# Patient Record
Sex: Female | Born: 1935 | Race: White | Hispanic: No | Smoking: Former smoker
Health system: Southern US, Community
[De-identification: ages and names within clinical notes are randomized; demographics above are authoritative.]

## PROBLEM LIST (undated history)

## (undated) DIAGNOSIS — G709 Myoneural disorder, unspecified: Secondary | ICD-10-CM

## (undated) DIAGNOSIS — Z87442 Personal history of urinary calculi: Secondary | ICD-10-CM

## (undated) DIAGNOSIS — T8859XA Other complications of anesthesia, initial encounter: Secondary | ICD-10-CM

## (undated) DIAGNOSIS — C801 Malignant (primary) neoplasm, unspecified: Secondary | ICD-10-CM

## (undated) DIAGNOSIS — R519 Headache, unspecified: Secondary | ICD-10-CM

## (undated) DIAGNOSIS — E785 Hyperlipidemia, unspecified: Secondary | ICD-10-CM

## (undated) DIAGNOSIS — T7840XA Allergy, unspecified, initial encounter: Secondary | ICD-10-CM

## (undated) DIAGNOSIS — D649 Anemia, unspecified: Secondary | ICD-10-CM

## (undated) DIAGNOSIS — R918 Other nonspecific abnormal finding of lung field: Secondary | ICD-10-CM

## (undated) DIAGNOSIS — R51 Headache: Secondary | ICD-10-CM

## (undated) DIAGNOSIS — M1990 Unspecified osteoarthritis, unspecified site: Secondary | ICD-10-CM

## (undated) DIAGNOSIS — T4145XA Adverse effect of unspecified anesthetic, initial encounter: Secondary | ICD-10-CM

## (undated) DIAGNOSIS — C50919 Malignant neoplasm of unspecified site of unspecified female breast: Secondary | ICD-10-CM

## (undated) DIAGNOSIS — R059 Cough, unspecified: Secondary | ICD-10-CM

## (undated) DIAGNOSIS — R0981 Nasal congestion: Secondary | ICD-10-CM

## (undated) DIAGNOSIS — R05 Cough: Secondary | ICD-10-CM

## (undated) DIAGNOSIS — Z8489 Family history of other specified conditions: Secondary | ICD-10-CM

## (undated) DIAGNOSIS — Z5189 Encounter for other specified aftercare: Secondary | ICD-10-CM

## (undated) DIAGNOSIS — IMO0001 Reserved for inherently not codable concepts without codable children: Secondary | ICD-10-CM

## (undated) HISTORY — PX: TOTAL KNEE ARTHROPLASTY: SHX125

## (undated) HISTORY — PX: LITHOTRIPSY: SUR834

## (undated) HISTORY — DX: Myoneural disorder, unspecified: G70.9

## (undated) HISTORY — DX: Unspecified osteoarthritis, unspecified site: M19.90

## (undated) HISTORY — DX: Encounter for other specified aftercare: Z51.89

## (undated) HISTORY — DX: Allergy, unspecified, initial encounter: T78.40XA

## (undated) HISTORY — PX: FOOT SURGERY: SHX648

## (undated) HISTORY — DX: Cough, unspecified: R05.9

## (undated) HISTORY — DX: Malignant (primary) neoplasm, unspecified: C80.1

## (undated) HISTORY — PX: KIDNEY STONE SURGERY: SHX686

## (undated) HISTORY — PX: BACK SURGERY: SHX140

## (undated) HISTORY — PX: SHOULDER ARTHROSCOPY: SHX128

## (undated) HISTORY — DX: Hyperlipidemia, unspecified: E78.5

## (undated) HISTORY — DX: Anemia, unspecified: D64.9

## (undated) HISTORY — DX: Cough: R05

## (undated) HISTORY — DX: Headache, unspecified: R51.9

## (undated) HISTORY — DX: Reserved for inherently not codable concepts without codable children: IMO0001

## (undated) HISTORY — DX: Nasal congestion: R09.81

## (undated) HISTORY — DX: Headache: R51

---

## 2000-06-21 ENCOUNTER — Encounter: Payer: Self-pay | Admitting: Neurosurgery

## 2000-06-25 ENCOUNTER — Encounter: Payer: Self-pay | Admitting: Neurosurgery

## 2000-06-25 ENCOUNTER — Inpatient Hospital Stay (HOSPITAL_COMMUNITY): Admission: RE | Admit: 2000-06-25 | Discharge: 2000-06-30 | Payer: Self-pay | Admitting: Neurosurgery

## 2000-06-28 ENCOUNTER — Encounter: Payer: Self-pay | Admitting: Neurosurgery

## 2004-02-23 ENCOUNTER — Ambulatory Visit: Payer: Self-pay | Admitting: Internal Medicine

## 2004-03-25 ENCOUNTER — Ambulatory Visit: Payer: Self-pay | Admitting: Internal Medicine

## 2004-04-24 ENCOUNTER — Ambulatory Visit: Payer: Self-pay | Admitting: Internal Medicine

## 2004-06-10 ENCOUNTER — Ambulatory Visit: Payer: Self-pay | Admitting: Neurology

## 2004-11-11 ENCOUNTER — Ambulatory Visit: Payer: Self-pay | Admitting: Internal Medicine

## 2006-03-16 ENCOUNTER — Ambulatory Visit: Payer: Self-pay | Admitting: Gastroenterology

## 2007-05-30 ENCOUNTER — Encounter: Admission: RE | Admit: 2007-05-30 | Discharge: 2007-05-30 | Payer: Self-pay | Admitting: General Surgery

## 2007-06-03 ENCOUNTER — Ambulatory Visit: Payer: Self-pay | Admitting: Oncology

## 2007-06-10 LAB — CBC WITH DIFFERENTIAL (CANCER CENTER ONLY)
BASO#: 0 10*3/uL (ref 0.0–0.2)
Eosinophils Absolute: 0.2 10*3/uL (ref 0.0–0.5)
HGB: 11.8 g/dL (ref 11.6–15.9)
MONO#: 0.4 10*3/uL (ref 0.1–0.9)
NEUT#: 3.5 10*3/uL (ref 1.5–6.5)
Platelets: 289 10*3/uL (ref 145–400)
RBC: 4.13 10*6/uL (ref 3.70–5.32)
WBC: 5.7 10*3/uL (ref 3.9–10.0)

## 2007-06-26 HISTORY — PX: BREAST SURGERY: SHX581

## 2007-07-12 ENCOUNTER — Encounter (INDEPENDENT_AMBULATORY_CARE_PROVIDER_SITE_OTHER): Payer: Self-pay | Admitting: General Surgery

## 2007-07-12 ENCOUNTER — Ambulatory Visit (HOSPITAL_COMMUNITY): Admission: RE | Admit: 2007-07-12 | Discharge: 2007-07-13 | Payer: Self-pay | Admitting: General Surgery

## 2007-07-21 ENCOUNTER — Ambulatory Visit: Payer: Self-pay | Admitting: Oncology

## 2007-07-22 LAB — CBC WITH DIFFERENTIAL (CANCER CENTER ONLY)
EOS%: 2.8 % (ref 0.0–7.0)
Eosinophils Absolute: 0.3 10*3/uL (ref 0.0–0.5)
HGB: 11 g/dL — ABNORMAL LOW (ref 11.6–15.9)
LYMPH#: 2 10*3/uL (ref 0.9–3.3)
MCH: 28.4 pg (ref 26.0–34.0)
MCHC: 33.6 g/dL (ref 32.0–36.0)
MONO%: 7.1 % (ref 0.0–13.0)
NEUT#: 6.8 10*3/uL — ABNORMAL HIGH (ref 1.5–6.5)
Platelets: 340 10*3/uL (ref 145–400)
RBC: 3.87 10*6/uL (ref 3.70–5.32)

## 2007-07-22 LAB — CMP (CANCER CENTER ONLY)
ALT(SGPT): 19 U/L (ref 10–47)
AST: 34 U/L (ref 11–38)
Albumin: 3.8 g/dL (ref 3.3–5.5)
CO2: 28 mEq/L (ref 18–33)
Calcium: 10.3 mg/dL (ref 8.0–10.3)
Chloride: 105 mEq/L (ref 98–108)
Creat: 1.2 mg/dl (ref 0.6–1.2)
Potassium: 3.9 mEq/L (ref 3.3–4.7)
Total Protein: 7.5 g/dL (ref 6.4–8.1)

## 2007-07-27 ENCOUNTER — Ambulatory Visit: Admission: RE | Admit: 2007-07-27 | Discharge: 2007-10-18 | Payer: Self-pay | Admitting: Radiation Oncology

## 2007-07-29 ENCOUNTER — Encounter: Admission: RE | Admit: 2007-07-29 | Discharge: 2007-07-29 | Payer: Self-pay | Admitting: Oncology

## 2007-10-21 ENCOUNTER — Ambulatory Visit: Payer: Self-pay | Admitting: Oncology

## 2007-10-24 LAB — RETICULOCYTES (CHCC): ABS Retic: 67 10*3/uL (ref 19.0–186.0)

## 2007-10-24 LAB — CBC WITH DIFFERENTIAL (CANCER CENTER ONLY)
BASO%: 0.3 % (ref 0.0–2.0)
LYMPH%: 16 % (ref 14.0–48.0)
MCV: 83 fL (ref 81–101)
MONO#: 0.4 10*3/uL (ref 0.1–0.9)
Platelets: 270 10*3/uL (ref 145–400)
RDW: 14.7 % — ABNORMAL HIGH (ref 10.5–14.6)
WBC: 4.6 10*3/uL (ref 3.9–10.0)

## 2007-10-24 LAB — IRON AND TIBC
%SAT: 18 % — ABNORMAL LOW (ref 20–55)
Iron: 82 ug/dL (ref 42–145)
TIBC: 456 ug/dL (ref 250–470)
UIBC: 374 ug/dL

## 2007-10-24 LAB — COMPREHENSIVE METABOLIC PANEL
ALT: 16 U/L (ref 0–35)
AST: 17 U/L (ref 0–37)
Alkaline Phosphatase: 48 U/L (ref 39–117)
Creatinine, Ser: 1.75 mg/dL — ABNORMAL HIGH (ref 0.40–1.20)
Total Bilirubin: 0.3 mg/dL (ref 0.3–1.2)

## 2007-10-24 LAB — CANCER ANTIGEN 27.29: CA 27.29: 17 U/mL (ref 0–39)

## 2007-10-24 LAB — VITAMIN B12: Vitamin B-12: 1717 pg/mL — ABNORMAL HIGH (ref 211–911)

## 2007-10-24 LAB — FOLATE: Folate: >20 ng/mL

## 2007-11-24 LAB — CBC WITH DIFFERENTIAL (CANCER CENTER ONLY)
BASO%: 0.3 % (ref 0.0–2.0)
HCT: 32.5 % — ABNORMAL LOW (ref 34.8–46.6)
LYMPH#: 0.9 10*3/uL (ref 0.9–3.3)
MONO#: 0.3 10*3/uL (ref 0.1–0.9)
Platelets: 276 10*3/uL (ref 145–400)
RDW: 14.4 % (ref 10.5–14.6)
WBC: 5.4 10*3/uL (ref 3.9–10.0)

## 2008-01-02 ENCOUNTER — Ambulatory Visit: Payer: Self-pay | Admitting: Oncology

## 2008-01-09 LAB — CBC WITH DIFFERENTIAL (CANCER CENTER ONLY)
BASO#: 0 10*3/uL (ref 0.0–0.2)
BASO%: 0.5 % (ref 0.0–2.0)
EOS%: 4.9 % (ref 0.0–7.0)
HGB: 10.9 g/dL — ABNORMAL LOW (ref 11.6–15.9)
LYMPH#: 1.1 10*3/uL (ref 0.9–3.3)
MCH: 29.5 pg (ref 26.0–34.0)
MCHC: 34.5 g/dL (ref 32.0–36.0)
MONO%: 9.3 % (ref 0.0–13.0)
NEUT#: 4.2 10*3/uL (ref 1.5–6.5)
RDW: 13.7 % (ref 10.5–14.6)

## 2008-01-10 ENCOUNTER — Ambulatory Visit: Payer: Self-pay | Admitting: Oncology

## 2008-04-04 ENCOUNTER — Ambulatory Visit: Payer: Self-pay | Admitting: Oncology

## 2008-04-10 LAB — CBC WITH DIFFERENTIAL (CANCER CENTER ONLY)
BASO%: 0.7 % (ref 0.0–2.0)
EOS%: 5 % (ref 0.0–7.0)
Eosinophils Absolute: 0.3 10*3/uL (ref 0.0–0.5)
LYMPH%: 20.6 % (ref 14.0–48.0)
MCH: 29.7 pg (ref 26.0–34.0)
MCHC: 33.9 g/dL (ref 32.0–36.0)
MCV: 88 fL (ref 81–101)
MONO%: 8.4 % (ref 0.0–13.0)
Platelets: 273 10*3/uL (ref 145–400)
RDW: 13.5 % (ref 10.5–14.6)

## 2008-04-10 LAB — CMP (CANCER CENTER ONLY)
ALT(SGPT): 23 U/L (ref 10–47)
AST: 27 U/L (ref 11–38)
Albumin: 3.8 g/dL (ref 3.3–5.5)
CO2: 28 mEq/L (ref 18–33)
Calcium: 9.7 mg/dL (ref 8.0–10.3)
Chloride: 104 mEq/L (ref 98–108)
Potassium: 5.1 mEq/L — ABNORMAL HIGH (ref 3.3–4.7)
Total Protein: 7.5 g/dL (ref 6.4–8.1)

## 2008-05-10 ENCOUNTER — Encounter: Admission: RE | Admit: 2008-05-10 | Discharge: 2008-05-10 | Payer: Self-pay | Admitting: General Surgery

## 2008-05-21 ENCOUNTER — Ambulatory Visit: Payer: Self-pay | Admitting: Oncology

## 2008-05-22 LAB — CMP (CANCER CENTER ONLY)
Albumin: 4.1 g/dL (ref 3.3–5.5)
BUN, Bld: 50 mg/dL — ABNORMAL HIGH (ref 7–22)
Calcium: 10 mg/dL (ref 8.0–10.3)
Chloride: 104 mEq/L (ref 98–108)
Creat: 1.8 mg/dl — ABNORMAL HIGH (ref 0.6–1.2)
Glucose, Bld: 44 mg/dL — ABNORMAL LOW (ref 73–118)
Potassium: 5.5 mEq/L — ABNORMAL HIGH (ref 3.3–4.7)

## 2008-05-22 LAB — CBC WITH DIFFERENTIAL (CANCER CENTER ONLY)
BASO#: 0 10*3/uL (ref 0.0–0.2)
Eosinophils Absolute: 0.2 10*3/uL (ref 0.0–0.5)
HGB: 11.9 g/dL (ref 11.6–15.9)
LYMPH%: 18.4 % (ref 14.0–48.0)
MCH: 29.1 pg (ref 26.0–34.0)
MCHC: 33.1 g/dL (ref 32.0–36.0)
MCV: 88 fL (ref 81–101)
MONO%: 8.2 % (ref 0.0–13.0)
NEUT%: 69.6 % (ref 39.6–80.0)
RBC: 4.09 10*6/uL (ref 3.70–5.32)

## 2008-07-24 ENCOUNTER — Ambulatory Visit: Payer: Self-pay | Admitting: Oncology

## 2008-07-25 LAB — CBC WITH DIFFERENTIAL (CANCER CENTER ONLY)
BASO#: 0 10*3/uL (ref 0.0–0.2)
Eosinophils Absolute: 0.3 10*3/uL (ref 0.0–0.5)
HCT: 35.7 % (ref 34.8–46.6)
HGB: 11.9 g/dL (ref 11.6–15.9)
LYMPH#: 1.4 10*3/uL (ref 0.9–3.3)
MCH: 29.4 pg (ref 26.0–34.0)
NEUT#: 2.9 10*3/uL (ref 1.5–6.5)
NEUT%: 57.4 % (ref 39.6–80.0)
RBC: 4.04 10*6/uL (ref 3.70–5.32)

## 2008-07-25 LAB — COMPREHENSIVE METABOLIC PANEL
AST: 31 U/L (ref 0–37)
Alkaline Phosphatase: 53 U/L (ref 39–117)
Glucose, Bld: 57 mg/dL — ABNORMAL LOW (ref 70–99)
Potassium: 5.6 mEq/L — ABNORMAL HIGH (ref 3.5–5.3)
Sodium: 141 mEq/L (ref 135–145)
Total Bilirubin: 0.6 mg/dL (ref 0.3–1.2)
Total Protein: 7.1 g/dL (ref 6.0–8.3)

## 2008-07-26 ENCOUNTER — Ambulatory Visit: Payer: Self-pay | Admitting: Oncology

## 2008-10-19 ENCOUNTER — Ambulatory Visit: Payer: Self-pay | Admitting: Oncology

## 2008-10-25 LAB — CBC WITH DIFFERENTIAL (CANCER CENTER ONLY)
BASO#: 0 10*3/uL (ref 0.0–0.2)
EOS%: 7.1 % — ABNORMAL HIGH (ref 0.0–7.0)
HCT: 32.7 % — ABNORMAL LOW (ref 34.8–46.6)
HGB: 11.7 g/dL (ref 11.6–15.9)
LYMPH#: 1.3 10*3/uL (ref 0.9–3.3)
MCHC: 35.6 g/dL (ref 32.0–36.0)
MONO#: 0.5 10*3/uL (ref 0.1–0.9)
NEUT#: 2.5 10*3/uL (ref 1.5–6.5)
NEUT%: 54.4 % (ref 39.6–80.0)
RBC: 3.77 10*6/uL (ref 3.70–5.32)
WBC: 4.7 10*3/uL (ref 3.9–10.0)

## 2008-10-25 LAB — CMP (CANCER CENTER ONLY)
ALT(SGPT): 26 U/L (ref 10–47)
Albumin: 3.5 g/dL (ref 3.3–5.5)
CO2: 26 mEq/L (ref 18–33)
Chloride: 107 mEq/L (ref 98–108)
Glucose, Bld: 155 mg/dL — ABNORMAL HIGH (ref 73–118)
Potassium: 5.5 mEq/L — ABNORMAL HIGH (ref 3.3–4.7)
Sodium: 140 mEq/L (ref 128–145)
Total Bilirubin: 0.5 mg/dl (ref 0.20–1.60)
Total Protein: 7.1 g/dL (ref 6.4–8.1)

## 2008-12-26 ENCOUNTER — Ambulatory Visit: Payer: Self-pay | Admitting: Neurosurgery

## 2009-01-15 ENCOUNTER — Ambulatory Visit: Payer: Self-pay | Admitting: Sports Medicine

## 2009-02-11 ENCOUNTER — Ambulatory Visit: Payer: Self-pay | Admitting: Unknown Physician Specialty

## 2009-02-12 ENCOUNTER — Ambulatory Visit: Payer: Self-pay | Admitting: Unknown Physician Specialty

## 2009-05-06 ENCOUNTER — Ambulatory Visit: Payer: Self-pay | Admitting: Oncology

## 2009-05-09 LAB — CMP (CANCER CENTER ONLY)
Alkaline Phosphatase: 65 U/L (ref 26–84)
BUN, Bld: 31 mg/dL — ABNORMAL HIGH (ref 7–22)
CO2: 33 mEq/L (ref 18–33)
Creat: 1.2 mg/dl (ref 0.6–1.2)
Glucose, Bld: 180 mg/dL — ABNORMAL HIGH (ref 73–118)
Total Bilirubin: 0.4 mg/dl (ref 0.20–1.60)
Total Protein: 7.3 g/dL (ref 6.4–8.1)

## 2009-05-09 LAB — CBC WITH DIFFERENTIAL (CANCER CENTER ONLY)
BASO%: 0.5 % (ref 0.0–2.0)
EOS%: 5.9 % (ref 0.0–7.0)
HCT: 34.3 % — ABNORMAL LOW (ref 34.8–46.6)
LYMPH#: 1.6 10*3/uL (ref 0.9–3.3)
MCHC: 33.5 g/dL (ref 32.0–36.0)
MONO#: 0.5 10*3/uL (ref 0.1–0.9)
NEUT#: 3 10*3/uL (ref 1.5–6.5)
NEUT%: 56.2 % (ref 39.6–80.0)
Platelets: 246 10*3/uL (ref 145–400)
RDW: 13.9 % (ref 10.5–14.6)
WBC: 5.4 10*3/uL (ref 3.9–10.0)

## 2009-05-09 LAB — CANCER ANTIGEN 27.29: CA 27.29: 16 U/mL (ref 0–39)

## 2009-05-15 ENCOUNTER — Encounter: Admission: RE | Admit: 2009-05-15 | Discharge: 2009-05-15 | Payer: Self-pay | Admitting: Internal Medicine

## 2009-05-25 DIAGNOSIS — C50919 Malignant neoplasm of unspecified site of unspecified female breast: Secondary | ICD-10-CM

## 2009-05-25 HISTORY — PX: MASTECTOMY: SHX3

## 2009-05-25 HISTORY — PX: BREAST SURGERY: SHX581

## 2009-05-25 HISTORY — DX: Malignant neoplasm of unspecified site of unspecified female breast: C50.919

## 2009-07-24 ENCOUNTER — Ambulatory Visit: Payer: Self-pay | Admitting: Unknown Physician Specialty

## 2009-07-30 ENCOUNTER — Inpatient Hospital Stay: Payer: Self-pay | Admitting: Unknown Physician Specialty

## 2009-11-06 ENCOUNTER — Ambulatory Visit: Payer: Self-pay | Admitting: Oncology

## 2009-11-12 LAB — CMP (CANCER CENTER ONLY)
AST: 27 U/L (ref 11–38)
Albumin: 4 g/dL (ref 3.3–5.5)
Alkaline Phosphatase: 81 U/L (ref 26–84)
BUN, Bld: 25 mg/dL — ABNORMAL HIGH (ref 7–22)
Calcium: 9.7 mg/dL (ref 8.0–10.3)
Chloride: 98 mEq/L (ref 98–108)
Glucose, Bld: 70 mg/dL — ABNORMAL LOW (ref 73–118)
Potassium: 4 mEq/L (ref 3.3–4.7)
Sodium: 137 mEq/L (ref 128–145)
Total Protein: 6.8 g/dL (ref 6.4–8.1)

## 2009-11-12 LAB — CBC WITH DIFFERENTIAL (CANCER CENTER ONLY)
BASO#: 0 10*3/uL (ref 0.0–0.2)
Eosinophils Absolute: 0.3 10*3/uL (ref 0.0–0.5)
HGB: 12.1 g/dL (ref 11.6–15.9)
LYMPH#: 1.7 10*3/uL (ref 0.9–3.3)
NEUT#: 3.1 10*3/uL (ref 1.5–6.5)
Platelets: 352 10*3/uL (ref 145–400)
RBC: 4.06 10*6/uL (ref 3.70–5.32)
WBC: 5.7 10*3/uL (ref 3.9–10.0)

## 2010-02-06 ENCOUNTER — Encounter: Admission: RE | Admit: 2010-02-06 | Discharge: 2010-02-06 | Payer: Self-pay | Admitting: General Surgery

## 2010-02-17 ENCOUNTER — Encounter: Admission: RE | Admit: 2010-02-17 | Discharge: 2010-02-17 | Payer: Self-pay | Admitting: Endocrinology

## 2010-05-02 ENCOUNTER — Encounter
Admission: RE | Admit: 2010-05-02 | Discharge: 2010-05-02 | Payer: Self-pay | Source: Home / Self Care | Attending: Internal Medicine | Admitting: Internal Medicine

## 2010-05-25 HISTORY — PX: REDUCTION MAMMAPLASTY: SUR839

## 2010-06-13 ENCOUNTER — Ambulatory Visit: Payer: Self-pay | Admitting: Oncology

## 2010-06-17 LAB — CBC WITH DIFFERENTIAL/PLATELET
Basophils Absolute: 0.1 10*3/uL (ref 0.0–0.1)
Eosinophils Absolute: 0.3 10*3/uL (ref 0.0–0.5)
HGB: 12.7 g/dL (ref 11.6–15.9)
MCV: 89.1 fL (ref 79.5–101.0)
MONO#: 0.8 10*3/uL (ref 0.1–0.9)
MONO%: 12.1 % (ref 0.0–14.0)
NEUT#: 3.6 10*3/uL (ref 1.5–6.5)
RDW: 14.5 % (ref 11.2–14.5)
WBC: 6.4 10*3/uL (ref 3.9–10.3)
lymph#: 1.5 10*3/uL (ref 0.9–3.3)

## 2010-06-17 LAB — COMPREHENSIVE METABOLIC PANEL
Albumin: 4.5 g/dL (ref 3.5–5.2)
BUN: 30 mg/dL — ABNORMAL HIGH (ref 6–23)
CO2: 27 mEq/L (ref 19–32)
Calcium: 10.9 mg/dL — ABNORMAL HIGH (ref 8.4–10.5)
Chloride: 102 mEq/L (ref 96–112)
Glucose, Bld: 100 mg/dL — ABNORMAL HIGH (ref 70–99)
Potassium: 4.5 mEq/L (ref 3.5–5.3)
Total Protein: 7.2 g/dL (ref 6.0–8.3)

## 2010-07-01 ENCOUNTER — Other Ambulatory Visit: Payer: Self-pay | Admitting: Endocrinology

## 2010-07-01 DIAGNOSIS — E042 Nontoxic multinodular goiter: Secondary | ICD-10-CM

## 2010-07-07 ENCOUNTER — Ambulatory Visit
Admission: RE | Admit: 2010-07-07 | Discharge: 2010-07-07 | Disposition: A | Discharge status: Home / Self Care | Payer: Medicare Other | Source: Ambulatory Visit | Attending: Endocrinology | Admitting: Endocrinology

## 2010-07-07 DIAGNOSIS — E042 Nontoxic multinodular goiter: Secondary | ICD-10-CM

## 2010-07-30 ENCOUNTER — Encounter (HOSPITAL_BASED_OUTPATIENT_CLINIC_OR_DEPARTMENT_OTHER)
Admission: RE | Admit: 2010-07-30 | Discharge: 2010-07-30 | Disposition: A | Discharge status: Home / Self Care | Payer: Medicare Other | Source: Ambulatory Visit | Attending: Plastic Surgery | Admitting: Plastic Surgery

## 2010-07-30 LAB — BASIC METABOLIC PANEL
BUN: 31 mg/dL — ABNORMAL HIGH (ref 6–23)
Calcium: 9.3 mg/dL (ref 8.4–10.5)
GFR calc non Af Amer: 47 mL/min — ABNORMAL LOW (ref >60)
Glucose, Bld: 90 mg/dL (ref 70–99)
Potassium: 4.5 mEq/L (ref 3.5–5.1)

## 2010-08-04 ENCOUNTER — Other Ambulatory Visit: Payer: Self-pay | Admitting: Plastic Surgery

## 2010-08-04 ENCOUNTER — Ambulatory Visit (HOSPITAL_BASED_OUTPATIENT_CLINIC_OR_DEPARTMENT_OTHER)
Admission: RE | Admit: 2010-08-04 | Discharge: 2010-08-04 | Disposition: A | Discharge status: Home / Self Care | Payer: Medicare Other | Source: Ambulatory Visit | Attending: Plastic Surgery | Admitting: Plastic Surgery

## 2010-08-04 DIAGNOSIS — J45909 Unspecified asthma, uncomplicated: Secondary | ICD-10-CM | POA: Insufficient documentation

## 2010-08-04 DIAGNOSIS — Z01812 Encounter for preprocedural laboratory examination: Secondary | ICD-10-CM | POA: Insufficient documentation

## 2010-08-04 DIAGNOSIS — Z7982 Long term (current) use of aspirin: Secondary | ICD-10-CM | POA: Insufficient documentation

## 2010-08-04 DIAGNOSIS — Z853 Personal history of malignant neoplasm of breast: Secondary | ICD-10-CM | POA: Insufficient documentation

## 2010-08-04 DIAGNOSIS — E119 Type 2 diabetes mellitus without complications: Secondary | ICD-10-CM | POA: Insufficient documentation

## 2010-08-04 DIAGNOSIS — N6481 Ptosis of breast: Secondary | ICD-10-CM | POA: Insufficient documentation

## 2010-08-04 DIAGNOSIS — Z01818 Encounter for other preprocedural examination: Secondary | ICD-10-CM | POA: Insufficient documentation

## 2010-08-04 LAB — GLUCOSE, CAPILLARY: Glucose-Capillary: 129 mg/dL — ABNORMAL HIGH (ref 70–99)

## 2010-09-26 ENCOUNTER — Ambulatory Visit
Admission: RE | Admit: 2010-09-26 | Discharge: 2010-09-26 | Disposition: A | Discharge status: Home / Self Care | Payer: Medicare Other | Source: Ambulatory Visit | Attending: Plastic Surgery | Admitting: Plastic Surgery

## 2010-09-26 ENCOUNTER — Other Ambulatory Visit: Payer: Self-pay | Admitting: Plastic Surgery

## 2010-09-26 ENCOUNTER — Encounter (HOSPITAL_BASED_OUTPATIENT_CLINIC_OR_DEPARTMENT_OTHER)
Admission: RE | Admit: 2010-09-26 | Discharge: 2010-09-26 | Disposition: A | Discharge status: Home / Self Care | Payer: Medicare Other | Source: Ambulatory Visit | Attending: Plastic Surgery | Admitting: Plastic Surgery

## 2010-09-26 DIAGNOSIS — Z01811 Encounter for preprocedural respiratory examination: Secondary | ICD-10-CM

## 2010-09-26 LAB — BASIC METABOLIC PANEL
BUN: 37 mg/dL — ABNORMAL HIGH (ref 6–23)
Calcium: 10 mg/dL (ref 8.4–10.5)
Creatinine, Ser: 1.21 mg/dL — ABNORMAL HIGH (ref 0.4–1.2)
GFR calc non Af Amer: 43 mL/min — ABNORMAL LOW (ref >60)
Glucose, Bld: 104 mg/dL — ABNORMAL HIGH (ref 70–99)
Potassium: 4.8 mEq/L (ref 3.5–5.1)

## 2010-09-29 ENCOUNTER — Ambulatory Visit (HOSPITAL_BASED_OUTPATIENT_CLINIC_OR_DEPARTMENT_OTHER)
Admission: RE | Admit: 2010-09-29 | Discharge: 2010-09-29 | Disposition: A | Discharge status: Home / Self Care | Payer: Medicare Other | Source: Ambulatory Visit | Attending: Plastic Surgery | Admitting: Plastic Surgery

## 2010-09-29 DIAGNOSIS — Y836 Removal of other organ (partial) (total) as the cause of abnormal reaction of the patient, or of later complication, without mention of misadventure at the time of the procedure: Secondary | ICD-10-CM | POA: Insufficient documentation

## 2010-09-29 DIAGNOSIS — Z01812 Encounter for preprocedural laboratory examination: Secondary | ICD-10-CM | POA: Insufficient documentation

## 2010-09-29 DIAGNOSIS — Z01810 Encounter for preprocedural cardiovascular examination: Secondary | ICD-10-CM | POA: Insufficient documentation

## 2010-09-29 DIAGNOSIS — T8131XA Disruption of external operation (surgical) wound, not elsewhere classified, initial encounter: Secondary | ICD-10-CM | POA: Insufficient documentation

## 2010-09-29 DIAGNOSIS — Z79899 Other long term (current) drug therapy: Secondary | ICD-10-CM | POA: Insufficient documentation

## 2010-09-29 LAB — GLUCOSE, CAPILLARY: Glucose-Capillary: 173 mg/dL — ABNORMAL HIGH (ref 70–99)

## 2010-09-30 NOTE — Op Note (Signed)
  NAMESHU, LACOCK                ACCOUNT NO.:  [id number]  MEDICAL RECORD NO.:  [id number]           PATIENT TYPE:  LOCATION:                                 FACILITY:  PHYSICIAN:  Theodoro Kos, DO           DATE OF BIRTH:  DATE OF PROCEDURE:  09/29/2010 DATE OF DISCHARGE:                              OPERATIVE REPORT   PREOPERATIVE DIAGNOSIS:  Left breast wound status post reduction.  POSTOPERATIVE DIAGNOSIS:  Left breast wound status post reduction.  PROCEDURE:  Irrigation and debridement of left breast wound with primary closure with tissue advancement.  ATTENDING:  Theodoro Kos, DO  ANESTHESIA:  Sedation with local.  INDICATIONS FOR PROCEDURE:  Ms. Manzer is a 77 year old female who underwent breast reduction for symmetry due to breast cancer on the opposite side.  She had wound breakdown likely secondary to diabetes. She has been undergoing dressing changes.  Decision was made to bring her into the OR for primary closure, irrigation and debridement.  DESCRIPTION OF PROCEDURE:  The patient was taken to the operating room and placed on the operating room table in a supine position.  Anesthesia was administered.  A time-out was called, all information was confirmed to be correct.  She was prepped and draped in the usual sterile fashion. Lidocaine 1% with epinephrine was injected around the site.  The skin, subcuticular tissue, and fat was debrided with a #15 blade.  Undermining was done in the medial and lateral position 2 cm.  The tissue was advanced in order for closure to be obtained.  The deep layers were closed with 3-0 Vicryl, 4-0 Vicryl, and then 5-0 Monocryl.  Dermabond was then applied. She tolerated the procedure well with no complications.  She was awoken and taken to recovery in stable condition.  The wound was 25 cm2.     Theodoro Kos, DO     CS/MEDQ  D:  09/29/2010  T:  09/30/2010  Job:  [id number]  Electronically Signed by Theodoro Kos  on  09/30/2010 04:19:41 PM

## 2010-10-07 NOTE — Op Note (Signed)
NAMELORRAINA, PIGOTT                ACCOUNT NO.:  [id number]   MEDICAL RECORD NO.:  [id number]          PATIENT TYPE:  OIB   LOCATION:  [id number]                         FACILITY:  [facility]   PHYSICIAN:  Sammuel Hines. Daiva Nakayama, M.D. 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   PROCEDURES:  Right mastectomy and sentinel-lymph node biopsy x2 with  injection of blue dye.   SURGEON:  Dr. Marlou Starks.   ASSISTANT:  Dr. Johney Maine.   ANESTHESIA:  General endotracheal.   PROCEDURE:  After informed consent was obtained, the patient was brought  to the operating room and placed in a supine position on the operating  room table.  After induction of general anesthesia, the patient's right  chest and axilla were prepped with Betadine and draped in usual sterile  manner.  Two mL of methylene blue and 3 mL of injectable saline were  then injected in the subareolar position without difficulty.  Earlier in  the day, the patient had undergone injection of 1 mCi of technetium  sulfa colloid into the subareolar area as well.  A hot area was  identified in the right axilla.  This area was marked.  An elliptical  incision was then made around the nipple-areolar complex with a 10 blade  knife.  This incision was then carried down through the skin and  subcutaneous tissue sharply with electrocautery.  Skin hooks were then  used to elevate the skin flap towards the ceiling, and thin skin flaps  were created through the subcutaneous tissue sharply with the  electrocautery.  The skin flaps were then taken all the way down to the  chest wall.  This was done circumferentially around the wound.  Once the  superior skin flap was taken down into and near the axilla in the  superior lateral  portion of the incision, the NeoProbe was then used to  identify the hot spot again.  The axilla was entered sharply with the  electrocautery, and then blunt hemostat dissection was used with the  direction of the NeoProbe until a sentinel node was identified.  The  first sentinel node was hot and blue with ex vivo counts approximately  2400.  It was excised sharply with electrocautery and sent as sentinel  node #1.  A second deeper lymph node was also identified.  It was  dissected bluntly with hemostat dissection, and then once elevated, the  lymphatics of this node were clamped with a hemostat and divided and  ligated with a 3-0 Vicryl tie.  This node was hot with ex vivo counts of  800 but not blue, and it was sent as sentinel node #2 with no other hot  spots or blue or palpable lymph nodes could be appreciated.  At this  point, the attention was then turned to the breast.  The breast with the  pectoral fascia was removed superiorly and inferiorly.  This was done  sharply with electrocautery.  Once the breast was separated from the  chest wall, the lateral portion of the breast was amputated about the  level of the axilla so that no further breast tissue could be  appreciated.  This breast was then marked with a stitch on the lateral  aspect and sent to pathology for further evaluation.  The wound was then  irrigated with copious amounts of saline.  A small stab incision was  made laterally and inferiorly to the wound with a 15 blade knife.  A  tonsil clamp was placed through this opening into the wound, and a 19-  [place] round Blake drain was then brought into the wound.  The drain was  placed along the chest wall and anchored to the skin with a 3-0 nylon  stitch.  The superior and inferior skin flaps were then grossly  reapproximated with interrupted 3-0 Vicryl stitches, and the skin  was closed with staples.  Sterile dressings were then applied.  The  drain was placed to bulb  suction.  The patient tolerated the procedure  well.  At the end of the case, all needle, sponge and instrument counts  were correct.  The patient was then awakened and taken recovery in  stable condition.      Sammuel Hines. Daiva Nakayama, M.D.  Electronically Signed     PST/MEDQ  D:  07/12/2007  T:  07/13/2007  Job:  [id number]

## 2010-10-10 NOTE — Op Note (Signed)
[place]. [facility]  Patient:    Andrea Wallace, Andrea Wallace                       MRN: [id number] Proc. Date: 06/25/00 Adm. Date:  [id number] Attending:  Abran Duke                           Operative Report  PREOPERATIVE DIAGNOSIS:  Grade 1 degenerative spondylolisthesis with radiculopathy.  POSTOPERATIVE DIAGNOSIS:  Grade 1 degenerative spondylolisthesis with radiculopathy.  PROCEDURES: 1. L4-5 decompressive laminectomy with bilateral L4 and L5 foraminotomies. 2. L4-5 posterior lumbar interbody fusion utilizing Tangent wedges and local    autograft. 3. Posterolateral fusion utilizing pedicle screws and local autograft.  SURGEON:  Earnie Larsson, M.D.  ASSISTANT:  Faythe Ghee, M.D.  ANESTHESIA:  General endotracheal.  INDICATIONS:  Ms. Takata is a 77 year old female who has history of back and lower extremity pain consistent with bilateral L4 radiculopathy that is much worse on the left side.  The patient has failed conservative management.  The patient has an MRI scan demonstrating degenerative spondylolisthesis, grade 1, at L4-5, with severe bilateral L4 neural foramen stenosis, as well as moderate central stenosis.  The patient has been counseled as to her options.  She desires to proceed with an L4-5 decompression and fusion for hopeful relief of her symptoms.  DESCRIPTION OF PROCEDURE:  The patient was taken to the operating room and placed on the operating table, where after general endotracheal anesthesia was achieved, the patient was positioned prone onto the Saxon frame, appropriately padded for surgery of the lumbar region.  Shaved, prepped, and draped sterilely.  A 10 blade was used to make a linear skin incision overlying the L4-5 interspace.  This was carried down sharply in the midline. A subperiosteal dissection was then performed bilaterally, exposing the lamina of L3, L4, and L5, as well as the transverse processes of L4 and L5.   Deep self-retaining retractor was placed.  Intraoperative fluoroscopy was used, and the level was confirmed.  Decompressive laminectomy was then performed at L4-5 using Leksell rongeurs and high-speed drill and Kerrison rongeurs to completely remove the lamina of L4, then remove the superior one-third of the lamina of L5.  The ligamentum flavum between L3-4 and L4-5 were both removed using Kerrison rongeurs.  Underlying thecal sac was identified.  The facet joints were then removed between L4 and L5 using Kerrison rongeurs.  All bone was saved for later use in the fusion.  Exiting L4 nerve roots were followed out distally throughout their foramen.  Wide decompressive foraminotomies were then performed at both sides.  The wound was then copiously irrigated with antibiotic solution.  Attention was then placed to the interspace.  The thecal sac and exiting L5 nerve root were mobilized and tracked toward the midline, starting first on the left side.  The disk space was isolated, incised with a 15 blade in rectangular fashion.  A wide disk space cleanout was achieved using pituitary rongeurs, upward-angled pituitary rongeurs, and Epstein curettes.  The disk space was then distracted up to 9 mm, which subsequently reduced the patients deformity.  Attention was then placed to the patients contralateral side.  Once again a diskectomy was performed.  Preparation then made for interbody bone grafting.  Starting first on the patients left side, thecal sac and nerve roots were mobilized toward the midline.  Disk space was then reamed and cut with  an 8 mm Tangent wedge chisel.  An 8 x 26 mm Tangent wedge was then packed into place and recessed approximately 3 mm from the posterior cortical margin.  Fluoroscopic images revealed good placement of the bone graft at the proper operative level with normal anatomy of the spine. The procedure was then repeated on the contralateral side.  Prior to installation  of the second wedge, the interspace was packed with morcellized autograft.  Once again fluoroscopy revealed good position of the bone grafts. The patients deformity had been reduced.  Preparation then made for placement of the pedicle screw instrumentation.  The pedicles were isolated at L4 and L5 bilaterally.  Superficial cortical bone was removed using the high-speed drill.  Pedicle awl was then used to pass to the pedicle of L4 and L5 bilaterally.  This was done under fluoroscopic guidance.  Each pedicle was then tapped with the 5.2 mm screw tap.  At L4, 6.75 x 45 mm STRS pedicle screws were placed bilaterally, at L5 6.75 x 40 mm STRS pedicle screws were placed bilaterally.  All screws were given a final tightening and found to be solidly within the bone.  Prior to installation of the screws, the tapped holes were probed using a blunt probe and found to be within solid bone throughout.  A short-segment titanium rod was then contoured and placed over the screw heads.  Locking caps were placed onto the screw heads.  The caps were then engaged in a sequential fashion to place the construct under compression.  Final C-arm images revealed good position of the bone grafts and the hardware, proper operative level, with normal alignment of the spine. This as done in both the AP and lateral planes.  Transverse processes of L4 and L5 were then decorticated using the high-speed drill.  Morcellized autograft was packed posterolaterally for later fusion.  The wound was irrigated one final time.  The thecal sac and nerve roots were inspected and found to be free from injury or any compression.  Gelfoam was placed topically on the thecal sac for hemostasis, which was found to be good.  A medium Hemovac drain was left in the epidural space.  The wound was then closed in layers with Vicryl sutures.  Steri-Strips were applied to the surface.  There were no apparent complications.  The patient tolerated the  procedure well, and she returns to the recovery room postoperatively. DD:  06/25/00 TD:  06/25/00 Job: [id number]

## 2010-10-10 NOTE — Discharge Summary (Signed)
[place]. [facility]  Patient:    Andrea Wallace, Andrea Wallace                       MRN: [id number] Adm. Date:  [id number] Disc. Date: [id number] Attending:  Abran Duke                           Discharge Summary  DISCHARGE DIAGNOSES: 1. Grade 2 L4-5 degenerative spondylolisthesis with radiculopathy and severe    stenosis. 2. Postoperative ileus. 3. Diabetes mellitus, type 2.  OPERATIONS AND TREATMENTS: 1. L4-5 decompressive laminectomy with foraminotomies 2. L4-5 posterior interbody fusion utilizing tangent wedges and local    autograft. 3. Posterolateral fusion utilizing pedicle screw fixation and local autograft.  HISTORY OF PRESENT ILLNESS:  Ms. Edsall is a 77 year old female with a history of back and lower extremity pain which has failed conservative management. MRI scanning demonstrates grade 1 L4-5 degenerative spondylolisthesis with severe stenosis and severe compression of bilateral L4 nerve roots.  The patient presents for decompression and fusion.  HOSPITAL COURSE:  The patient was taken to the operating room where an uncomplicated [id number] decompression and fusion was performed.  Postoperatively, the patient did quite well.  Her back and lower extremity pain were completely resolved.  She was able to ambulate without difficulty.  The patient had a difficult postoperative time with a postoperative ileus which gradually cleared with medications, time, and bowel stimulation.  At the time of discharge, the patient had normal bowel function.  She was ambulating without difficulty or pain.  The wound is healing well.  CONDITION AT DISCHARGE:  Improved.  DISCHARGE MEDICATIONS:  The same as preoperatively with the addition of Vicodin and Valium for pain.  FOLLOW-UP:  I will follow her up in one week. DD:  07/14/00 TD:  07/15/00 Job: [id number]

## 2011-02-13 LAB — DIFFERENTIAL
Basophils Absolute: 0.1
Eosinophils Relative: 4
Lymphocytes Relative: 23
Neutrophils Relative %: 63

## 2011-02-13 LAB — CBC
HCT: 36.7
Hemoglobin: 12.3
MCV: 87.1
RBC: 4.21
WBC: 9.1

## 2011-02-13 LAB — COMPREHENSIVE METABOLIC PANEL
AST: 28
BUN: 28 — ABNORMAL HIGH
CO2: 28
Chloride: 104
Creatinine, Ser: 1.28 — ABNORMAL HIGH
GFR calc non Af Amer: 41 — ABNORMAL LOW
Glucose, Bld: 128 — ABNORMAL HIGH
Total Bilirubin: 0.5

## 2011-02-25 ENCOUNTER — Ambulatory Visit: Payer: Self-pay | Admitting: Oncology

## 2011-03-16 ENCOUNTER — Ambulatory Visit (INDEPENDENT_AMBULATORY_CARE_PROVIDER_SITE_OTHER): Payer: Medicare Other | Admitting: General Surgery

## 2011-03-16 ENCOUNTER — Encounter (INDEPENDENT_AMBULATORY_CARE_PROVIDER_SITE_OTHER): Payer: Self-pay | Admitting: General Surgery

## 2011-03-16 VITALS — BP 134/60 | HR 72 | Temp 97.2°F | Resp 16 | Ht 64.0 in | Wt 176.8 lb

## 2011-03-16 DIAGNOSIS — C50919 Malignant neoplasm of unspecified site of unspecified female breast: Secondary | ICD-10-CM | POA: Insufficient documentation

## 2011-03-16 NOTE — Patient Instructions (Signed)
Continue regular self exams Send [place] a copy of mammogram in Dec

## 2011-03-17 NOTE — Progress Notes (Signed)
Subjective:     Patient ID: Andrea Wallace, female   DOB: 08/30/1931, 77 y.o.   MRN: [id number]  HPI The patient is a 77 year old white female who is now about 3-1/2 years out from a right mastectomy for a T2 breast cancer With a micro-met in one of 2 lymph nodes. She was ER PR positive and HER-2/neu negative. She did not tolerate antiestrogen therapy. The last time we saw her she had a recent left breast reduction and had a superficial wound infection. This is since resolved. She is having minimal discomfort in the chest wall her breast area.  Review of Systems  Constitutional: Positive for fatigue.  HENT: Negative.   Eyes: Negative.   Respiratory: Negative.   Cardiovascular: Negative.   Gastrointestinal: Negative.   Genitourinary: Negative.   Musculoskeletal: Negative.   Skin: Negative.   Neurological: Negative.   Hematological: Negative.   Psychiatric/Behavioral: Negative.        Objective:   Physical Exam  Constitutional: She is oriented to person, place, and time. She appears well-developed and well-nourished.  HENT:  Head: Normocephalic and atraumatic.  Eyes: Conjunctivae and EOM are normal. Pupils are equal, round, and reactive to light.  Neck: Normal range of motion. Neck supple.  Cardiovascular: Normal rate, regular rhythm and normal heart sounds.   Pulmonary/Chest: Effort normal and breath sounds normal.       Her right mastectomy incision is healed nicely. She has no palpable mass of the right chest wall. Her reduction incisions on the left breast have healed nicely. No palpable mass the left breast. No axillary supraclavicular or cervical lymphadenopathy  Abdominal: Soft. Bowel sounds are normal. She exhibits no mass. There is no tenderness.  Musculoskeletal: Normal range of motion.  Neurological: She is alert and oriented to person, place, and time.  Skin: Skin is warm and dry.  Psychiatric: She has a normal mood and affect. Her behavior is normal.         Assessment:     3-1/2 years out from a right mastectomy    Plan:     At this point she seems to be doing well. I see no evidence of recurrent disease. We will plan to see her back in another 6 months.

## 2011-03-26 ENCOUNTER — Ambulatory Visit: Payer: Self-pay | Admitting: Oncology

## 2011-08-24 ENCOUNTER — Ambulatory Visit: Payer: Self-pay | Admitting: Oncology

## 2011-08-27 ENCOUNTER — Ambulatory Visit: Payer: Self-pay | Admitting: Oncology

## 2011-08-28 LAB — CANCER ANTIGEN 27.29: CA 27.29: 16 U/mL (ref 0.0–38.6)

## 2011-09-08 ENCOUNTER — Encounter (INDEPENDENT_AMBULATORY_CARE_PROVIDER_SITE_OTHER): Payer: Self-pay | Admitting: General Surgery

## 2011-09-14 ENCOUNTER — Ambulatory Visit (INDEPENDENT_AMBULATORY_CARE_PROVIDER_SITE_OTHER): Payer: Medicare Other | Admitting: General Surgery

## 2011-09-14 ENCOUNTER — Encounter (INDEPENDENT_AMBULATORY_CARE_PROVIDER_SITE_OTHER): Payer: Self-pay | Admitting: General Surgery

## 2011-09-14 VITALS — BP 138/56 | HR 88 | Temp 98.1°F | Ht 65.0 in | Wt 177.6 lb

## 2011-09-14 DIAGNOSIS — C50919 Malignant neoplasm of unspecified site of unspecified female breast: Secondary | ICD-10-CM

## 2011-09-14 NOTE — Patient Instructions (Signed)
Continue regular self exams  

## 2011-09-23 ENCOUNTER — Ambulatory Visit: Payer: Self-pay | Admitting: Oncology

## 2011-09-24 NOTE — Progress Notes (Signed)
Subjective:     Patient ID: Andrea Wallace, female   DOB: 22-Jan-1932, 78 y.o.   MRN: [id number]  HPI The patient is a 78 year old white female who is 4 years out from a right mastectomy for a T2 Nmic breast cancer. Since her last visit she fell and broke her wrist. She did not require surgery for this. Since her last visit she is also switch to a new cancer doctor in [place] and Dr. Grayland Ormond. Her most recent mammogram was done in [place] and she reports it as normal. We do not have that film to review today. Otherwise she denies any chest wall tenderness.  Review of Systems  Constitutional: Negative.   HENT: Negative.   Eyes: Negative.   Respiratory: Negative.   Cardiovascular: Negative.   Gastrointestinal: Negative.   Genitourinary: Negative.   Musculoskeletal: Positive for arthralgias.  Skin: Negative.   Neurological: Negative.   Hematological: Negative.   Psychiatric/Behavioral: Negative.        Objective:   Physical Exam  Constitutional: She is oriented to person, place, and time. She appears well-developed and well-nourished.  HENT:  Head: Normocephalic and atraumatic.  Eyes: Conjunctivae and EOM are normal. Pupils are equal, round, and reactive to light.  Neck: Normal range of motion. Neck supple.  Cardiovascular: Normal rate, regular rhythm and normal heart sounds.   Pulmonary/Chest: Effort normal and breath sounds normal.       There is no palpable mass of the right chest wall. No palpable mass of the left breast. No palpable axillary supraclavicular or cervical lymphadenopathy  Abdominal: Soft. Bowel sounds are normal. She exhibits no mass. There is no tenderness.  Musculoskeletal: Normal range of motion.  Lymphadenopathy:    She has no cervical adenopathy.  Neurological: She is alert and oriented to person, place, and time.  Skin: Skin is warm and dry.  Psychiatric: She has a normal mood and affect. Her behavior is normal.       Assessment:     4 years  status post right mastectomy    Plan:     At this point she seems to be doing reasonably well. She will continue to do regular self exams. We will plan to see her back in another 6 months.

## 2012-02-04 ENCOUNTER — Other Ambulatory Visit: Payer: Self-pay | Admitting: Neurosurgery

## 2012-02-04 DIAGNOSIS — M48061 Spinal stenosis, lumbar region without neurogenic claudication: Secondary | ICD-10-CM

## 2012-02-09 ENCOUNTER — Ambulatory Visit
Admission: RE | Admit: 2012-02-09 | Discharge: 2012-02-09 | Disposition: A | Discharge status: Home / Self Care | Payer: Medicare Other | Source: Ambulatory Visit | Attending: Neurosurgery | Admitting: Neurosurgery

## 2012-02-09 DIAGNOSIS — M48061 Spinal stenosis, lumbar region without neurogenic claudication: Secondary | ICD-10-CM

## 2012-03-01 ENCOUNTER — Encounter (INDEPENDENT_AMBULATORY_CARE_PROVIDER_SITE_OTHER): Payer: Self-pay | Admitting: General Surgery

## 2012-03-14 ENCOUNTER — Ambulatory Visit: Payer: Self-pay | Admitting: Oncology

## 2012-03-15 LAB — CANCER ANTIGEN 27.29: CA 27.29: 16 U/mL (ref 0.0–38.6)

## 2012-03-25 ENCOUNTER — Ambulatory Visit: Payer: Self-pay | Admitting: Oncology

## 2012-03-29 ENCOUNTER — Ambulatory Visit (INDEPENDENT_AMBULATORY_CARE_PROVIDER_SITE_OTHER): Payer: Medicare Other | Admitting: General Surgery

## 2012-04-01 ENCOUNTER — Ambulatory Visit (INDEPENDENT_AMBULATORY_CARE_PROVIDER_SITE_OTHER): Payer: Medicare Other | Admitting: General Surgery

## 2012-04-04 ENCOUNTER — Encounter (INDEPENDENT_AMBULATORY_CARE_PROVIDER_SITE_OTHER): Payer: Self-pay | Admitting: General Surgery

## 2012-04-04 ENCOUNTER — Ambulatory Visit (INDEPENDENT_AMBULATORY_CARE_PROVIDER_SITE_OTHER): Payer: Medicare Other | Admitting: General Surgery

## 2012-04-04 VITALS — BP 128/78 | HR 84 | Temp 98.0°F | Resp 18 | Ht 65.0 in | Wt 176.0 lb

## 2012-04-04 DIAGNOSIS — C50919 Malignant neoplasm of unspecified site of unspecified female breast: Secondary | ICD-10-CM

## 2012-04-04 NOTE — Patient Instructions (Signed)
Continue regular self exams  

## 2012-04-04 NOTE — Progress Notes (Signed)
Subjective:     Patient ID: Andrea Wallace, female   DOB: April 11, 1932, 78 y.o.   MRN: [id number]  HPI The patient is a 78 year old white female who is 4-1/2 years status post right mastectomy for a T2 Nmic right breast cancer. Since her last visit she has continued to have more back pain issues. She is seeing Dr. Trenton Gammon for this. She also has a new oncologist name Dr. Grayland Ormond in [place].  Review of Systems  Constitutional: Negative.   HENT: Negative.   Eyes: Negative.   Respiratory: Negative.   Cardiovascular: Negative.   Gastrointestinal: Negative.   Genitourinary: Negative.   Musculoskeletal: Negative.   Skin: Negative.   Neurological: Negative.   Hematological: Negative.   Psychiatric/Behavioral: Negative.        Objective:   Physical Exam  Constitutional: She is oriented to person, place, and time. She appears well-developed and well-nourished.  HENT:  Head: Normocephalic and atraumatic.  Eyes: Conjunctivae normal and EOM are normal. Pupils are equal, round, and reactive to light.  Neck: Normal range of motion. Neck supple.  Cardiovascular: Normal rate, regular rhythm and normal heart sounds.   Pulmonary/Chest: Effort normal and breath sounds normal.       There is no palpable mass of the right chest wall. There is no palpable mass in the left breast. No palpable axillary or supraclavicular cervical lymphadenopathy.  Abdominal: Soft. Bowel sounds are normal.  Musculoskeletal: Normal range of motion.  Neurological: She is oriented to person, place, and time.  Skin: Skin is warm and dry.  Psychiatric: She has a normal mood and affect. Her behavior is normal.       Assessment:     The patient is 4 1/2 years status post right mastectomy    Plan:     At this point she will continue to do regular self exams. We will plan to see her back in another 6 months.

## 2012-04-24 ENCOUNTER — Ambulatory Visit: Payer: Self-pay | Admitting: Oncology

## 2012-06-07 ENCOUNTER — Other Ambulatory Visit: Payer: Self-pay | Admitting: Neurosurgery

## 2012-06-08 ENCOUNTER — Encounter (HOSPITAL_COMMUNITY): Payer: Self-pay | Admitting: Pharmacy Technician

## 2012-06-16 ENCOUNTER — Other Ambulatory Visit (HOSPITAL_COMMUNITY): Payer: Medicare Other

## 2012-06-21 ENCOUNTER — Encounter (HOSPITAL_COMMUNITY): Payer: Self-pay

## 2012-06-21 ENCOUNTER — Encounter (HOSPITAL_COMMUNITY)
Admission: RE | Admit: 2012-06-21 | Discharge: 2012-06-21 | Disposition: A | Discharge status: Home / Self Care | Payer: Medicare Other | Source: Ambulatory Visit | Attending: Neurosurgery | Admitting: Neurosurgery

## 2012-06-21 HISTORY — DX: Adverse effect of unspecified anesthetic, initial encounter: T41.45XA

## 2012-06-21 HISTORY — DX: Other complications of anesthesia, initial encounter: T88.59XA

## 2012-06-21 LAB — CBC WITH DIFFERENTIAL/PLATELET
Eosinophils Absolute: 0.4 10*3/uL (ref 0.0–0.7)
Hemoglobin: 11.9 g/dL — ABNORMAL LOW (ref 12.0–15.0)
Lymphocytes Relative: 20 % (ref 12–46)
Lymphs Abs: 1.9 10*3/uL (ref 0.7–4.0)
MCH: 29.4 pg (ref 26.0–34.0)
Monocytes Relative: 7 % (ref 3–12)
Neutro Abs: 6.8 10*3/uL (ref 1.7–7.7)
Neutrophils Relative %: 69 % (ref 43–77)
Platelets: 251 10*3/uL (ref 150–400)
RBC: 4.05 MIL/uL (ref 3.87–5.11)
WBC: 9.8 10*3/uL (ref 4.0–10.5)

## 2012-06-21 LAB — BASIC METABOLIC PANEL
Calcium: 9.5 mg/dL (ref 8.4–10.5)
GFR calc non Af Amer: 39 mL/min — ABNORMAL LOW (ref >90)
Glucose, Bld: 58 mg/dL — ABNORMAL LOW (ref 70–99)
Potassium: 4.6 mEq/L (ref 3.5–5.1)
Sodium: 145 mEq/L (ref 135–145)

## 2012-06-21 LAB — SURGICAL PCR SCREEN: Staphylococcus aureus: NEGATIVE

## 2012-06-21 LAB — ABO/RH: ABO/RH(D): A POS

## 2012-06-21 NOTE — Progress Notes (Signed)
Dr Ouida Sills called for ekg,cxr. Also stress test to be done on 1/30  And results to be faxed.

## 2012-06-21 NOTE — Pre-Procedure Instructions (Signed)
Andrea Wallace  06/21/2012   Your procedure is scheduled on: Monday  06/27/12   Report to [facility] at 530 AM.  Call this number if you have problems the morning of surgery: [phone]   Remember:   Do not eat food or drink liquids after midnight.   Take these medicines the morning of surgery with A SIP OF WATER: FELODIPINE, NEURONTIN    Do not wear jewelry, make-up or nail polish.  Do not wear lotions, powders, or perfumes. You may wear deodorant.  Do not shave 48 hours prior to surgery. Men may shave face and neck.  Do not bring valuables to the hospital.  Contacts, dentures or bridgework may not be worn into surgery.  Leave suitcase in the car. After surgery it may be brought to your room.  For patients admitted to the hospital, checkout time is 11:00 AM the day of  discharge.   Patients discharged the day of surgery will not be allowed to drive  home.  Name and phone number of your driver:   Special Instructions: Shower using CHG 2 nights before surgery and the night before surgery.  If you shower the day of surgery use CHG.  Use special wash - you have one bottle of CHG for all showers.  You should use approximately 1/3 of the bottle for each shower.   Please read over the following fact sheets that you were given: Pain Booklet, Coughing and Deep Breathing, Blood Transfusion Information, MRSA Information and Surgical Site Infection Prevention

## 2012-06-22 NOTE — Consult Note (Addendum)
Anesthesia Chart Review:  Patient is a 79 year old female scheduled for L2-3, L3-4, L5-S1 decompressive laminectomy/PLIF by Dr. Annette Stable on 06/27/12.  History includes former smoker, asthma, HLD, DM on insulin, HTN, bilateral breast cancer.  She reports she is difficult to wake up after anesthesia. PCP is Dr. Frazier Richards, last office note from 06/14/12 for follow-up and preoperative evaluation.  His stated he thought patient was at moderately increased risk for back surgery, so he recommended a Lexiscan stress test which is being done tomorrow.   EKG from her PCP on 06/14/12 showed NSR, possible septal infarct.  By handwritten notation, it shows no acute change.  Stress echo on 02/01/09 was normal.  CXR from 10/08/11 (PCP) showed no acute disease of the chest.  Preoperative labs noted.  CBG on arrival.  There offices close at 1500 on 06/24/12.  I've asked that the reading of her stress test be expedited so results will be available by tomorrow afternoon since she is currently scheduled as a first case on Monday.  Myra Gianotti, PA-C 06/23/12 1245  Addendum: 06/24/12 1345 The official report from patient's stress test today is not yet available, but it was reviewed by Cardiologist Dr. Serafina Royals.  He faxed over a note stating, Daliza Yasuda stress test is normal and cleared for surgery."  I faxed a copy of this note to [facility].

## 2012-06-26 MED ORDER — CEFAZOLIN SODIUM-DEXTROSE 2-3 GM-% IV SOLR
2.0000 g | INTRAVENOUS | Status: AC
  Administered 2012-06-27: 2 g via INTRAVENOUS
  Filled 2012-06-26: qty 50

## 2012-06-27 ENCOUNTER — Inpatient Hospital Stay (HOSPITAL_COMMUNITY)
Admission: RE | Admit: 2012-06-27 | Discharge: 2012-07-01 | DRG: 460 | Disposition: A | Discharge status: Skilled Nursing Facility | Payer: Medicare Other | Source: Ambulatory Visit | Attending: Neurosurgery | Admitting: Neurosurgery

## 2012-06-27 ENCOUNTER — Inpatient Hospital Stay (HOSPITAL_COMMUNITY): Payer: Medicare Other | Admitting: Vascular Surgery

## 2012-06-27 ENCOUNTER — Encounter (HOSPITAL_COMMUNITY): Payer: Self-pay | Admitting: Vascular Surgery

## 2012-06-27 ENCOUNTER — Encounter (HOSPITAL_COMMUNITY): Payer: Self-pay | Admitting: *Deleted

## 2012-06-27 ENCOUNTER — Inpatient Hospital Stay (HOSPITAL_COMMUNITY): Payer: Medicare Other

## 2012-06-27 ENCOUNTER — Encounter (HOSPITAL_COMMUNITY)
Admission: RE | Disposition: A | Discharge status: Skilled Nursing Facility | Payer: Self-pay | Source: Ambulatory Visit | Attending: Neurosurgery

## 2012-06-27 DIAGNOSIS — Z87891 Personal history of nicotine dependence: Secondary | ICD-10-CM

## 2012-06-27 DIAGNOSIS — I10 Essential (primary) hypertension: Secondary | ICD-10-CM | POA: Diagnosis present

## 2012-06-27 DIAGNOSIS — Z96659 Presence of unspecified artificial knee joint: Secondary | ICD-10-CM

## 2012-06-27 DIAGNOSIS — Z853 Personal history of malignant neoplasm of breast: Secondary | ICD-10-CM

## 2012-06-27 DIAGNOSIS — Z8249 Family history of ischemic heart disease and other diseases of the circulatory system: Secondary | ICD-10-CM

## 2012-06-27 DIAGNOSIS — D62 Acute posthemorrhagic anemia: Secondary | ICD-10-CM

## 2012-06-27 DIAGNOSIS — J45909 Unspecified asthma, uncomplicated: Secondary | ICD-10-CM | POA: Diagnosis present

## 2012-06-27 DIAGNOSIS — Z79899 Other long term (current) drug therapy: Secondary | ICD-10-CM

## 2012-06-27 DIAGNOSIS — Z01812 Encounter for preprocedural laboratory examination: Secondary | ICD-10-CM

## 2012-06-27 DIAGNOSIS — Z794 Long term (current) use of insulin: Secondary | ICD-10-CM

## 2012-06-27 DIAGNOSIS — M48062 Spinal stenosis, lumbar region with neurogenic claudication: Principal | ICD-10-CM | POA: Diagnosis present

## 2012-06-27 DIAGNOSIS — E785 Hyperlipidemia, unspecified: Secondary | ICD-10-CM | POA: Diagnosis present

## 2012-06-27 DIAGNOSIS — Z9010 Acquired absence of unspecified breast and nipple: Secondary | ICD-10-CM

## 2012-06-27 DIAGNOSIS — E119 Type 2 diabetes mellitus without complications: Secondary | ICD-10-CM | POA: Diagnosis present

## 2012-06-27 LAB — POCT I-STAT 4, (NA,K, GLUC, HGB,HCT)
Glucose, Bld: 136 mg/dL — ABNORMAL HIGH (ref 70–99)
HCT: 29 % — ABNORMAL LOW (ref 36.0–46.0)
Hemoglobin: 8.2 g/dL — ABNORMAL LOW (ref 12.0–15.0)
Hemoglobin: 9.9 g/dL — ABNORMAL LOW (ref 12.0–15.0)
Sodium: 142 mEq/L (ref 135–145)
Sodium: 142 mEq/L (ref 135–145)

## 2012-06-27 LAB — GLUCOSE, CAPILLARY: Glucose-Capillary: 74 mg/dL (ref 70–99)

## 2012-06-27 SURGERY — POSTERIOR LUMBAR FUSION 3 LEVEL
Anesthesia: General | Site: Back | Wound class: Clean

## 2012-06-27 MED ORDER — CALCIUM CHLORIDE 10 % IV SOLN
INTRAVENOUS | Status: DC | PRN
  Administered 2012-06-27 (×6): 100 mg via INTRAVENOUS

## 2012-06-27 MED ORDER — SODIUM CHLORIDE 0.9 % IV SOLN
250.0000 mL | INTRAVENOUS | Status: DC
  Administered 2012-06-27: 20 mL via INTRAVENOUS

## 2012-06-27 MED ORDER — THROMBIN 20000 UNITS EX SOLR
CUTANEOUS | Status: DC | PRN
  Administered 2012-06-27 (×3): via TOPICAL

## 2012-06-27 MED ORDER — NEOSTIGMINE METHYLSULFATE 1 MG/ML IJ SOLN
INTRAMUSCULAR | Status: DC | PRN
  Administered 2012-06-27: 5 mg via INTRAVENOUS

## 2012-06-27 MED ORDER — EXENATIDE 5 MCG/0.02ML ~~LOC~~ SOPN: 5.0000 ug | PEN_INJECTOR | Freq: Every day | SUBCUTANEOUS | Status: DC

## 2012-06-27 MED ORDER — DEXAMETHASONE SODIUM PHOSPHATE 10 MG/ML IJ SOLN
10.0000 mg | INTRAMUSCULAR | Status: AC
  Administered 2012-06-27: 10 mg via INTRAVENOUS
  Filled 2012-06-27: qty 1

## 2012-06-27 MED ORDER — SODIUM CHLORIDE 0.9 % IV SOLN
INTRAVENOUS | Status: AC
  Administered 2012-06-27 (×2): via INTRAVENOUS
  Filled 2012-06-27: qty 500

## 2012-06-27 MED ORDER — ALBUMIN HUMAN 5 % IV SOLN
INTRAVENOUS | Status: DC | PRN
  Administered 2012-06-27 (×2): via INTRAVENOUS

## 2012-06-27 MED ORDER — SODIUM CHLORIDE 0.9 % IR SOLN
Status: DC | PRN
  Administered 2012-06-27: 09:00:00

## 2012-06-27 MED ORDER — MENTHOL 3 MG MT LOZG
1.0000 | LOZENGE | OROMUCOSAL | Status: DC | PRN
  Administered 2012-06-27: 3 mg via ORAL
  Filled 2012-06-27: qty 9

## 2012-06-27 MED ORDER — ONDANSETRON HCL 4 MG/2ML IJ SOLN: 4.0000 mg | Freq: Once | INTRAMUSCULAR | Status: DC | PRN

## 2012-06-27 MED ORDER — INSULIN ASPART 100 UNIT/ML ~~LOC~~ SOLN
0.0000 [IU] | Freq: Three times a day (TID) | SUBCUTANEOUS | Status: DC
  Administered 2012-06-27: 7 [IU] via SUBCUTANEOUS
  Administered 2012-06-28: 3 [IU] via SUBCUTANEOUS
  Administered 2012-06-28: 4 [IU] via SUBCUTANEOUS
  Administered 2012-06-28: 3 [IU] via SUBCUTANEOUS
  Administered 2012-06-29 (×3): 4 [IU] via SUBCUTANEOUS
  Administered 2012-06-30: 11 [IU] via SUBCUTANEOUS
  Administered 2012-06-30: 3 [IU] via SUBCUTANEOUS
  Administered 2012-06-30: 4 [IU] via SUBCUTANEOUS
  Administered 2012-07-01: 7 [IU] via SUBCUTANEOUS
  Administered 2012-07-01: 3 [IU] via SUBCUTANEOUS

## 2012-06-27 MED ORDER — SENNA 8.6 MG PO TABS
1.0000 | ORAL_TABLET | Freq: Two times a day (BID) | ORAL | Status: DC
  Administered 2012-06-27 – 2012-07-01 (×8): 8.6 mg via ORAL
  Filled 2012-06-27 (×9): qty 1

## 2012-06-27 MED ORDER — ACETAMINOPHEN 325 MG PO TABS: 650.0000 mg | ORAL_TABLET | ORAL | Status: DC | PRN

## 2012-06-27 MED ORDER — WHITE PETROLATUM GEL
Status: AC
  Administered 2012-06-27: 0.2 via TOPICAL
  Filled 2012-06-27: qty 5

## 2012-06-27 MED ORDER — ONDANSETRON HCL 4 MG/2ML IJ SOLN
INTRAMUSCULAR | Status: DC | PRN
  Administered 2012-06-27: 4 mg via INTRAVENOUS

## 2012-06-27 MED ORDER — BISACODYL 10 MG RE SUPP: 10.0000 mg | Freq: Every day | RECTAL | Status: DC | PRN

## 2012-06-27 MED ORDER — DIAZEPAM 5 MG PO TABS
5.0000 mg | ORAL_TABLET | Freq: Four times a day (QID) | ORAL | Status: DC | PRN
  Administered 2012-06-27: 5 mg via ORAL
  Administered 2012-06-30 – 2012-07-01 (×3): 10 mg via ORAL
  Filled 2012-06-27 (×3): qty 2
  Filled 2012-06-27: qty 1

## 2012-06-27 MED ORDER — FENTANYL CITRATE 0.05 MG/ML IJ SOLN
INTRAMUSCULAR | Status: DC | PRN
  Administered 2012-06-27 (×2): 50 ug via INTRAVENOUS
  Administered 2012-06-27: 100 ug via INTRAVENOUS
  Administered 2012-06-27: 50 ug via INTRAVENOUS

## 2012-06-27 MED ORDER — HYDROMORPHONE HCL PF 1 MG/ML IJ SOLN
0.2500 mg | INTRAMUSCULAR | Status: DC | PRN
  Administered 2012-06-27 (×2): 0.5 mg via INTRAVENOUS

## 2012-06-27 MED ORDER — ATORVASTATIN CALCIUM 80 MG PO TABS
80.0000 mg | ORAL_TABLET | Freq: Every day | ORAL | Status: DC
  Administered 2012-06-27 – 2012-06-30 (×4): 80 mg via ORAL
  Filled 2012-06-27 (×5): qty 1

## 2012-06-27 MED ORDER — GABAPENTIN 600 MG PO TABS
600.0000 mg | ORAL_TABLET | Freq: Three times a day (TID) | ORAL | Status: DC
  Administered 2012-06-27 – 2012-06-30 (×8): 600 mg via ORAL
  Filled 2012-06-27 (×11): qty 1

## 2012-06-27 MED ORDER — CEFAZOLIN SODIUM 1-5 GM-% IV SOLN
1.0000 g | Freq: Three times a day (TID) | INTRAVENOUS | Status: AC
  Administered 2012-06-27 (×2): 1 g via INTRAVENOUS
  Filled 2012-06-27 (×2): qty 50

## 2012-06-27 MED ORDER — FLEET ENEMA 7-19 GM/118ML RE ENEM
1.0000 | ENEMA | Freq: Once | RECTAL | Status: AC | PRN
  Filled 2012-06-27: qty 1

## 2012-06-27 MED ORDER — PANTOPRAZOLE SODIUM 40 MG PO TBEC
80.0000 mg | DELAYED_RELEASE_TABLET | Freq: Every day | ORAL | Status: DC
  Administered 2012-06-27 – 2012-07-01 (×5): 80 mg via ORAL
  Filled 2012-06-27 (×2): qty 2
  Filled 2012-06-27: qty 1
  Filled 2012-06-27: qty 2
  Filled 2012-06-27: qty 1

## 2012-06-27 MED ORDER — SODIUM CHLORIDE 0.9 % IJ SOLN
3.0000 mL | INTRAMUSCULAR | Status: DC | PRN
  Administered 2012-06-29: 3 mL via INTRAVENOUS

## 2012-06-27 MED ORDER — HYDROCODONE-ACETAMINOPHEN 5-325 MG PO TABS
1.0000 | ORAL_TABLET | ORAL | Status: DC | PRN
  Administered 2012-06-28 – 2012-07-01 (×5): 2 via ORAL
  Filled 2012-06-27 (×5): qty 2

## 2012-06-27 MED ORDER — SODIUM CHLORIDE 0.9 % IJ SOLN
3.0000 mL | Freq: Two times a day (BID) | INTRAMUSCULAR | Status: DC
  Administered 2012-06-27 – 2012-07-01 (×7): 3 mL via INTRAVENOUS

## 2012-06-27 MED ORDER — LACTATED RINGERS IV SOLN
INTRAVENOUS | Status: DC | PRN
  Administered 2012-06-27 (×5): via INTRAVENOUS

## 2012-06-27 MED ORDER — PHENOL 1.4 % MT LIQD
1.0000 | OROMUCOSAL | Status: DC | PRN
  Filled 2012-06-27: qty 177

## 2012-06-27 MED ORDER — OXYCODONE-ACETAMINOPHEN 5-325 MG PO TABS
1.0000 | ORAL_TABLET | ORAL | Status: DC | PRN
  Administered 2012-06-29 – 2012-07-01 (×10): 2 via ORAL
  Filled 2012-06-27: qty 2
  Filled 2012-06-27: qty 1
  Filled 2012-06-27 (×3): qty 2
  Filled 2012-06-27: qty 1
  Filled 2012-06-27 (×6): qty 2

## 2012-06-27 MED ORDER — ADULT MULTIVITAMIN W/MINERALS CH
1.0000 | ORAL_TABLET | Freq: Every day | ORAL | Status: DC
  Administered 2012-06-27 – 2012-07-01 (×5): 1 via ORAL
  Filled 2012-06-27 (×5): qty 1

## 2012-06-27 MED ORDER — INSULIN GLARGINE 100 UNIT/ML ~~LOC~~ SOLN
30.0000 [IU] | Freq: Two times a day (BID) | SUBCUTANEOUS | Status: DC
  Administered 2012-06-27 – 2012-07-01 (×8): 30 [IU] via SUBCUTANEOUS

## 2012-06-27 MED ORDER — ARTIFICIAL TEARS OP OINT
TOPICAL_OINTMENT | OPHTHALMIC | Status: DC | PRN
  Administered 2012-06-27: 1 via OPHTHALMIC

## 2012-06-27 MED ORDER — FELODIPINE ER 5 MG PO TB24
5.0000 mg | ORAL_TABLET | Freq: Every day | ORAL | Status: DC
  Administered 2012-06-27 – 2012-07-01 (×5): 5 mg via ORAL
  Filled 2012-06-27 (×5): qty 1

## 2012-06-27 MED ORDER — HYDROMORPHONE HCL PF 1 MG/ML IJ SOLN
INTRAMUSCULAR | Status: AC
  Filled 2012-06-27: qty 1

## 2012-06-27 MED ORDER — LIDOCAINE HCL 4 % MT SOLN
OROMUCOSAL | Status: DC | PRN
  Administered 2012-06-27: 4 mL via TOPICAL

## 2012-06-27 MED ORDER — PHENYLEPHRINE HCL 10 MG/ML IJ SOLN
INTRAMUSCULAR | Status: DC | PRN
  Administered 2012-06-27 (×2): 80 ug via INTRAVENOUS
  Administered 2012-06-27: 40 ug via INTRAVENOUS
  Administered 2012-06-27: 80 ug via INTRAVENOUS
  Administered 2012-06-27: 40 ug via INTRAVENOUS

## 2012-06-27 MED ORDER — EPHEDRINE SULFATE 50 MG/ML IJ SOLN
INTRAMUSCULAR | Status: DC | PRN
  Administered 2012-06-27: 10 mg via INTRAVENOUS
  Administered 2012-06-27: 5 mg via INTRAVENOUS
  Administered 2012-06-27 (×2): 10 mg via INTRAVENOUS

## 2012-06-27 MED ORDER — ACETAMINOPHEN 650 MG RE SUPP: 650.0000 mg | RECTAL | Status: DC | PRN

## 2012-06-27 MED ORDER — FENOFIBRATE 160 MG PO TABS
160.0000 mg | ORAL_TABLET | Freq: Every day | ORAL | Status: DC
  Administered 2012-06-28 – 2012-07-01 (×4): 160 mg via ORAL
  Filled 2012-06-27 (×4): qty 1

## 2012-06-27 MED ORDER — ALUM & MAG HYDROXIDE-SIMETH 200-200-20 MG/5ML PO SUSP: 30.0000 mL | Freq: Four times a day (QID) | ORAL | Status: DC | PRN

## 2012-06-27 MED ORDER — EPHEDRINE SULFATE 50 MG/ML IJ SOLN
INTRAMUSCULAR | Status: DC | PRN
  Administered 2012-06-27: 10 mg via INTRAVENOUS

## 2012-06-27 MED ORDER — ZOLPIDEM TARTRATE 5 MG PO TABS: 5.0000 mg | ORAL_TABLET | Freq: Every evening | ORAL | Status: DC | PRN

## 2012-06-27 MED ORDER — POLYETHYLENE GLYCOL 3350 17 G PO PACK
17.0000 g | PACK | Freq: Every day | ORAL | Status: DC | PRN
  Administered 2012-06-30: 17 g via ORAL
  Filled 2012-06-27 (×2): qty 1

## 2012-06-27 MED ORDER — PROPOFOL 10 MG/ML IV BOLUS
INTRAVENOUS | Status: DC | PRN
  Administered 2012-06-27: 150 mg via INTRAVENOUS

## 2012-06-27 MED ORDER — MONTELUKAST SODIUM 10 MG PO TABS
10.0000 mg | ORAL_TABLET | Freq: Every day | ORAL | Status: DC
  Administered 2012-06-27 – 2012-06-30 (×4): 10 mg via ORAL
  Filled 2012-06-27 (×5): qty 1

## 2012-06-27 MED ORDER — LIDOCAINE HCL (CARDIAC) 20 MG/ML IV SOLN
INTRAVENOUS | Status: DC | PRN
  Administered 2012-06-27: 100 mg via INTRAVENOUS

## 2012-06-27 MED ORDER — PROPRANOLOL HCL ER 120 MG PO CP24
120.0000 mg | ORAL_CAPSULE | Freq: Every day | ORAL | Status: DC
  Administered 2012-06-28 – 2012-07-01 (×4): 120 mg via ORAL
  Filled 2012-06-27 (×4): qty 1

## 2012-06-27 MED ORDER — 0.9 % SODIUM CHLORIDE (POUR BTL) OPTIME
TOPICAL | Status: DC | PRN
  Administered 2012-06-27: 1000 mL

## 2012-06-27 MED ORDER — BACITRACIN 50000 UNITS IM SOLR
INTRAMUSCULAR | Status: AC
  Filled 2012-06-27: qty 1

## 2012-06-27 MED ORDER — ROCURONIUM BROMIDE 100 MG/10ML IV SOLN
INTRAVENOUS | Status: DC | PRN
  Administered 2012-06-27 (×2): 10 mg via INTRAVENOUS
  Administered 2012-06-27: 40 mg via INTRAVENOUS
  Administered 2012-06-27: 20 mg via INTRAVENOUS

## 2012-06-27 MED ORDER — HYDROMORPHONE HCL PF 1 MG/ML IJ SOLN
0.5000 mg | INTRAMUSCULAR | Status: DC | PRN
  Administered 2012-06-27: 0.5 mg via INTRAVENOUS
  Administered 2012-06-27 – 2012-06-28 (×2): 1 mg via INTRAVENOUS
  Administered 2012-06-28 (×2): 0.5 mg via INTRAVENOUS
  Administered 2012-06-28 – 2012-07-01 (×4): 1 mg via INTRAVENOUS
  Filled 2012-06-27 (×9): qty 1

## 2012-06-27 MED ORDER — BUPIVACAINE HCL (PF) 0.25 % IJ SOLN
INTRAMUSCULAR | Status: DC | PRN
  Administered 2012-06-27: 30 mL

## 2012-06-27 MED ORDER — ONDANSETRON HCL 4 MG/2ML IJ SOLN: 4.0000 mg | INTRAMUSCULAR | Status: DC | PRN

## 2012-06-27 MED ORDER — GLYCOPYRROLATE 0.2 MG/ML IJ SOLN
INTRAMUSCULAR | Status: DC | PRN
  Administered 2012-06-27: .8 mg via INTRAVENOUS
  Administered 2012-06-27: .4 mg via INTRAVENOUS

## 2012-06-27 SURGICAL SUPPLY — 74 items
ADH SKN CLS APL DERMABOND .7 (GAUZE/BANDAGES/DRESSINGS) ×1
APL SKNCLS STERI-STRIP NONHPOA (GAUZE/BANDAGES/DRESSINGS) ×1
BAG DECANTER FOR FLEXI CONT (MISCELLANEOUS) ×2 IMPLANT
BENZOIN TINCTURE PRP APPL 2/3 (GAUZE/BANDAGES/DRESSINGS) ×2 IMPLANT
BLADE SURG ROTATE 9660 (MISCELLANEOUS) IMPLANT
BRUSH SCRUB EZ PLAIN DRY (MISCELLANEOUS) ×2 IMPLANT
BUR MATCHSTICK NEURO 3.0 LAGG (BURR) ×2 IMPLANT
CAGE CAPSTONE BULLET 8X22 (Cage) ×4 IMPLANT
CANISTER SUCTION 2500CC (MISCELLANEOUS) ×2 IMPLANT
CAP LCK SPNE (Orthopedic Implant) ×9 IMPLANT
CAP LOCK SPINE RADIUS (Orthopedic Implant) IMPLANT
CAP LOCKING (Orthopedic Implant) ×18 IMPLANT
CLOTH BEACON ORANGE TIMEOUT ST (SAFETY) ×2 IMPLANT
CONT SPEC 4OZ CLIKSEAL STRL BL (MISCELLANEOUS) ×4 IMPLANT
COVER BACK TABLE 24X17X13 BIG (DRAPES) ×1 IMPLANT
COVER TABLE BACK 60X90 (DRAPES) ×2 IMPLANT
CROSSLINK MEDIUM (Orthopedic Implant) ×1 IMPLANT
DECANTER SPIKE VIAL GLASS SM (MISCELLANEOUS) ×2 IMPLANT
DERMABOND ADVANCED (GAUZE/BANDAGES/DRESSINGS) ×1
DERMABOND ADVANCED .7 DNX12 (GAUZE/BANDAGES/DRESSINGS) ×1 IMPLANT
DRAPE C-ARM 42X72 X-RAY (DRAPES) ×4 IMPLANT
DRAPE LAPAROTOMY 100X72X124 (DRAPES) ×2 IMPLANT
DRAPE POUCH INSTRU U-SHP 10X18 (DRAPES) ×2 IMPLANT
DRAPE PROXIMA HALF (DRAPES) IMPLANT
DRAPE SURG 17X23 STRL (DRAPES) ×8 IMPLANT
DURAPREP 26ML APPLICATOR (WOUND CARE) ×1 IMPLANT
DURASEAL SPINE SEALANT 3ML (MISCELLANEOUS) ×1 IMPLANT
ELECT REM PT RETURN 9FT ADLT (ELECTROSURGICAL) ×2
ELECTRODE REM PT RTRN 9FT ADLT (ELECTROSURGICAL) ×1 IMPLANT
EVACUATOR 1/8 PVC DRAIN (DRAIN) ×2 IMPLANT
GAUZE SPONGE 4X4 16PLY XRAY LF (GAUZE/BANDAGES/DRESSINGS) IMPLANT
GLOVE BIO SURGEON STRL SZ8 (GLOVE) ×2 IMPLANT
GLOVE BIOGEL PI IND STRL 7.5 (GLOVE) IMPLANT
GLOVE BIOGEL PI INDICATOR 7.5 (GLOVE) ×2
GLOVE ECLIPSE 8.5 STRL (GLOVE) ×5 IMPLANT
GLOVE EXAM NITRILE LRG STRL (GLOVE) IMPLANT
GLOVE EXAM NITRILE MD LF STRL (GLOVE) IMPLANT
GLOVE EXAM NITRILE XL STR (GLOVE) IMPLANT
GLOVE EXAM NITRILE XS STR PU (GLOVE) IMPLANT
GLOVE INDICATOR 7.0 STRL GRN (GLOVE) ×2 IMPLANT
GLOVE INDICATOR 8.5 STRL (GLOVE) ×1 IMPLANT
GLOVE SURG SS PI 7.0 STRL IVOR (GLOVE) ×3 IMPLANT
GOWN BRE IMP SLV AUR LG STRL (GOWN DISPOSABLE) IMPLANT
GOWN BRE IMP SLV AUR XL STRL (GOWN DISPOSABLE) ×7 IMPLANT
GOWN STRL REIN 2XL LVL4 (GOWN DISPOSABLE) IMPLANT
KIT BASIN OR (CUSTOM PROCEDURE TRAY) ×2 IMPLANT
KIT ROOM TURNOVER OR (KITS) ×2 IMPLANT
MASTERGRAFT MATX STRIP 24CC (Orthopedic Implant) ×2 IMPLANT
MATRIX MASTERGRAFT STRIP 24CC (Orthopedic Implant) IMPLANT
NEEDLE HYPO 22GX1.5 SAFETY (NEEDLE) ×2 IMPLANT
NS IRRIG 1000ML POUR BTL (IV SOLUTION) ×2 IMPLANT
PACK LAMINECTOMY NEURO (CUSTOM PROCEDURE TRAY) ×2 IMPLANT
PATTIES SURGICAL 1X1 (DISPOSABLE) ×2 IMPLANT
ROD 120MM (Rod) ×2 IMPLANT
ROD 140MM (Rod) ×1 IMPLANT
ROD SPNL 120X5.5XNS TI RDS (Rod) IMPLANT
SCREW 5.75X45MM (Screw) ×4 IMPLANT
SCREW 6.75X40MM (Screw) ×3 IMPLANT
SCREW 6.75X45MM (Screw) ×2 IMPLANT
SPONGE GAUZE 4X4 12PLY (GAUZE/BANDAGES/DRESSINGS) ×2 IMPLANT
SPONGE SURGIFOAM ABS GEL 100 (HEMOSTASIS) ×2 IMPLANT
STRIP CLOSURE SKIN 1/2X4 (GAUZE/BANDAGES/DRESSINGS) ×3 IMPLANT
SUT PROLENE 5 0 C1 (SUTURE) ×1 IMPLANT
SUT VIC AB 0 CT1 18XCR BRD8 (SUTURE) ×2 IMPLANT
SUT VIC AB 0 CT1 8-18 (SUTURE) ×4
SUT VIC AB 2-0 CT1 18 (SUTURE) ×4 IMPLANT
SUT VIC AB 3-0 SH 8-18 (SUTURE) ×4 IMPLANT
SYR 20ML ECCENTRIC (SYRINGE) ×2 IMPLANT
TAPE CLOTH SURG 4X10 WHT LF (GAUZE/BANDAGES/DRESSINGS) ×1 IMPLANT
TOWEL OR 17X24 6PK STRL BLUE (TOWEL DISPOSABLE) ×2 IMPLANT
TOWEL OR 17X26 10 PK STRL BLUE (TOWEL DISPOSABLE) ×2 IMPLANT
TRAY FOLEY CATH 14FRSI W/METER (CATHETERS) ×2 IMPLANT
WATER STERILE IRR 1000ML POUR (IV SOLUTION) ×2 IMPLANT
WEDGE TANGENT 8X26MM ×2 IMPLANT

## 2012-06-27 NOTE — Progress Notes (Signed)
Orthopedic Tech Progress Note Patient Details:  Andrea Wallace 04/26/1932 [id number] This brace was a pre-fit. Patient ID: [name], female   DOB: 1932/01/10, 79 y.o.   MRN: [id number]   Braulio Bosch 06/27/2012, 8:10 PM

## 2012-06-27 NOTE — Transfer of Care (Signed)
Immediate Anesthesia Transfer of Care Note  Patient: Andrea Wallace  Procedure(s) Performed: Procedure(s) (LRB) with comments: POSTERIOR LUMBAR FUSION 3 LEVEL (N/A)  Patient Location: PACU  Anesthesia Type:General  Level of Consciousness: sedated  Airway & Oxygen Therapy: Patient Spontanous Breathing and Patient connected to face mask oxygen  Post-op Assessment: Report given to PACU RN, Post -op Vital signs reviewed and stable and Patient moving all extremities X 4  Post vital signs: Reviewed and stable  Complications: No apparent anesthesia complications

## 2012-06-27 NOTE — H&P (Signed)
Andrea Wallace is an 79 y.o. female.   Chief Complaint: Back and bilateral lower extremity pain HPI: 79 year old female with severe back pain with radiation to both lower extremities consistent with neurogenic claudication and failing conservative management. Patient is status post previous L4-5 decompression and fusion. She is progressively worsening stenosis at L2-3 and L3-4 and worsening disc degeneration and spondylosis at L5-S1. Patient has been counseled as to her options. She sided proceed with L2-3 L3-4 and L5 and S1 decompression and fusions with instrumentation.  Past Medical History  Diagnosis Date  . Anemia   . Arthritis   . Blood transfusion   . Asthma   . Diabetes mellitus   . Hyperlipidemia   . Neuromuscular disorder   . Generalized headaches   . Nasal congestion   . Cough   . Cancer     bilateral breast  . Allergy   . Complication of anesthesia     diff  waking up  . Hypertension     dr Ouida Sills    Past Surgical History  Procedure Date  . Total knee arthroplasty   . Shoulder arthroscopy     bilateral  . Breast surgery 06/2007    right mastectomy  . Breast surgery 2011    left  . Kidney stone surgery   . Back surgery   . Foot surgery     Family History  Problem Relation Age of Onset  . Osteoporosis Mother   . Heart disease Father   . Cancer Brother   . Heart disease Sister    Social History:  reports that she quit smoking about 28 years ago. She has never used smokeless tobacco. She reports that she drinks alcohol. She reports that she does not use illicit drugs.  Allergies:  Allergies  Allergen Reactions  . Codeine Itching    All over the body    Medications Prior to Admission  Medication Sig Dispense Refill  . B-D UF III MINI PEN NEEDLES 31G X 5 MM MISC daily.       . Cholecalciferol (VITAMIN D PO) Take 1 tablet by mouth daily.      . CRESTOR 40 MG tablet Take 40 mg by mouth daily.       . Cyanocobalamin (VITAMIN B-12 PO) Take 1 tablet by  mouth daily.      Marland Kitchen exenatide (BYETTA) 5 MCG/0.02ML SOLN Inject 5 mcg into the skin daily with breakfast.      . felodipine (PLENDIL) 5 MG 24 hr tablet Take 5 mg by mouth daily.       Marland Kitchen gabapentin (NEURONTIN) 600 MG tablet Take 600 mg by mouth 3 (three) times daily.       Marland Kitchen HUMALOG 100 UNIT/ML injection Inject 30-45 Units into the skin 3 (three) times daily before meals. 40 in the am, 30 at lunch, 45 units at bedtime      . HYDROcodone-acetaminophen (NORCO) 10-325 MG per tablet Take 1 tablet by mouth at bedtime as needed. For pain      . LANTUS 100 UNIT/ML injection Inject 30 Units into the skin 2 (two) times daily.       . montelukast (SINGULAIR) 10 MG tablet Take 10 mg by mouth at bedtime.       . Multiple Vitamin (MULTIVITAMIN WITH MINERALS) TABS Take 1 tablet by mouth daily.      Marland Kitchen omeprazole (PRILOSEC) 20 MG capsule Take 20 mg by mouth daily as needed. For acid reflux      .  propranolol (INDERAL LA) 120 MG 24 hr capsule Take 120 mg by mouth daily.       . traMADol (ULTRAM) 50 MG tablet Take 50 mg by mouth every 6 (six) hours as needed. For pain      . TRICOR 145 MG tablet Take 145 mg by mouth every evening.         Results for orders placed during the hospital encounter of 06/27/12 (from the past 48 hour(s))  GLUCOSE, CAPILLARY     Status: Normal   Collection Time   06/27/12  6:04 AM      Component Value Range Comment   Glucose-Capillary 74  70 - 99 mg/dL    No results found.  Review of Systems  Constitutional: Negative.   HENT: Negative.   Eyes: Negative.   Respiratory: Negative.   Cardiovascular: Negative.   Gastrointestinal: Negative.   Genitourinary: Negative.   Musculoskeletal: Negative.   Skin: Negative.   Neurological: Negative.   Endo/Heme/Allergies: Negative.   Psychiatric/Behavioral: Negative.     Blood pressure 112/50, pulse 60, temperature 98.3 F (36.8 C), temperature source Oral, resp. rate 18, SpO2 93.00%. Physical Exam  Constitutional: She is oriented to  person, place, and time. She appears well-developed and well-nourished.  HENT:  Head: Normocephalic and atraumatic.  Right Ear: External ear normal.  Left Ear: External ear normal.  Nose: Nose normal.  Mouth/Throat: Oropharynx is clear and moist.  Eyes: Conjunctivae normal and EOM are normal. Pupils are equal, round, and reactive to light. Right eye exhibits no discharge. Left eye exhibits no discharge.  Neck: Normal range of motion. Neck supple. No tracheal deviation present. No thyromegaly present.  Cardiovascular: Normal rate, regular rhythm, normal heart sounds and intact distal pulses.   Respiratory: Effort normal and breath sounds normal. No respiratory distress. She has no wheezes.  GI: Soft. Bowel sounds are normal. She exhibits no distension. There is no tenderness.  Genitourinary: Uterus normal.  Musculoskeletal: Normal range of motion. She exhibits no edema and no tenderness.  Neurological: She is alert and oriented to person, place, and time. She has normal reflexes. No cranial nerve deficit. Coordination normal.  Skin: Skin is warm and dry. No rash noted. No erythema. No pallor.  Psychiatric: She has a normal mood and affect. Her behavior is normal. Judgment and thought content normal.     Assessment/Plan L2-3, L3-4 stenosis with neurogenic claudication. L5-S1 stenosis with neurogenic claudication. Status post L4-5 decompression and fusion. Plan L2-3, L3-4, L5-S1 decompressive laminectomy with foraminotomies followed by posterior lumbar my fusion utilizing tangent interbody allograft wedge Telamon interbody peek cage and local autograft. Risks and benefits have been explained. Patient wishes to proceed.  Karo Rog A 06/27/2012, 7:42 AM

## 2012-06-27 NOTE — Anesthesia Procedure Notes (Signed)
Procedure Name: Intubation Date/Time: 06/27/2012 7:58 AM Performed by: Sherilyn Banker Pre-anesthesia Checklist: Patient identified, Emergency Drugs available, Suction available, Patient being monitored and Timeout performed Patient Re-evaluated:Patient Re-evaluated prior to inductionOxygen Delivery Method: Circle system utilized Preoxygenation: Pre-oxygenation with 100% oxygen Intubation Type: IV induction Ventilation: Mask ventilation without difficulty and Oral airway inserted - appropriate to patient size Laryngoscope Size: Mac and 3 Grade View: Grade II Tube type: Oral Tube size: 7.5 mm Number of attempts: 1 Airway Equipment and Method: Stylet and LTA kit utilized Placement Confirmation: ETT inserted through vocal cords under direct vision and breath sounds checked- equal and bilateral Secured at: 21 cm Tube secured with: Tape Dental Injury: Teeth and Oropharynx as per pre-operative assessment

## 2012-06-27 NOTE — Brief Op Note (Signed)
06/27/2012  1:27 PM  PATIENT:  Andrea Wallace  79 y.o. female  PRE-OPERATIVE DIAGNOSIS:  Stenosis  POST-OPERATIVE DIAGNOSIS:  Stenosis  PROCEDURE:  Procedure(s) (LRB) with comments: POSTERIOR LUMBAR FUSION 3 LEVEL (N/A)  SURGEON:  Surgeon(s) and Role:    * Charlie Pitter, MD - Primary    * Elaina Hoops, MD - Assisting  PHYSICIAN ASSISTANT:   ASSISTANTS:    ANESTHESIA:   general  EBL:  Total I/O In: 6300 [I.V.:4800; Blood:1000; IV Piggyback:500] Out: 1050 [Urine:150; Blood:900]  BLOOD ADMINISTERED:2 units CC PRBC  DRAINS: (Medium) Hemovact drain(s) in the Epidural space with  Suction Open   LOCAL MEDICATIONS USED:  MARCAINE     SPECIMEN:  No Specimen  DISPOSITION OF SPECIMEN:  N/A  COUNTS:  YES  TOURNIQUET:  * No tourniquets in log *  DICTATION: .Dragon Dictation  PLAN OF CARE: Admit to inpatient   PATIENT DISPOSITION:  PACU - hemodynamically stable.   Delay start of Pharmacological VTE agent (>24hrs) due to surgical blood loss or risk of bleeding: yes

## 2012-06-27 NOTE — Anesthesia Preprocedure Evaluation (Addendum)
Anesthesia Evaluation  Patient identified by MRN, date of birth, ID band Patient awake    Reviewed: Allergy & Precautions, H&P , NPO status , Patient's Chart, lab work & pertinent test results  Airway Mallampati: I TM Distance: >3 FB Neck ROM: full    Dental  (+) Dental Advisory Given, Edentulous Upper and Partial Lower   Pulmonary asthma ,          Cardiovascular hypertension, Pt. on home beta blockers Rhythm:regular Rate:Normal     Neuro/Psych  Headaches,  Neuromuscular disease    GI/Hepatic GERD-  ,  Endo/Other  diabetes, Type 1, Insulin Dependent and Oral Hypoglycemic Agents  Renal/GU      Musculoskeletal   Abdominal   Peds  Hematology   Anesthesia Other Findings   Reproductive/Obstetrics                         Anesthesia Physical Anesthesia Plan  ASA: III  Anesthesia Plan: General   Post-op Pain Management:    Induction: Intravenous  Airway Management Planned: Oral ETT  Additional Equipment:   Intra-op Plan:   Post-operative Plan: Extubation in OR  Informed Consent: I have reviewed the patients History and Physical, chart, labs and discussed the procedure including the risks, benefits and alternatives for the proposed anesthesia with the patient or authorized representative who has indicated his/her understanding and acceptance.     Plan Discussed with: CRNA, Anesthesiologist and Surgeon  Anesthesia Plan Comments:         Anesthesia Quick Evaluation

## 2012-06-27 NOTE — Preoperative (Signed)
Beta Blockers   Reason not to administer Beta Blockers:Beta Blocker taken this am

## 2012-06-27 NOTE — Anesthesia Postprocedure Evaluation (Signed)
  Anesthesia Post-op Note  Patient: Andrea Wallace  Procedure(s) Performed: Procedure(s) (LRB) with comments: POSTERIOR LUMBAR FUSION 3 LEVEL (N/A)  Patient Location: PACU  Anesthesia Type:General  Level of Consciousness: awake, oriented, sedated and patient cooperative  Airway and Oxygen Therapy: Patient Spontanous Breathing  Post-op Pain: mild  Post-op Assessment: Post-op Vital signs reviewed, Patient's Cardiovascular Status Stable, Respiratory Function Stable, Patent Airway, No signs of Nausea or vomiting and Pain level controlled  Post-op Vital Signs: stable  Complications: No apparent anesthesia complications

## 2012-06-27 NOTE — Progress Notes (Signed)
Pt.'s blood sugar 74, states "i'm starting to go down".pt. Denies feeling any shakiness. Notified Dr. Chriss Driver. Pt. Ready for surgery. Stated he wasn't going to treat her at present,but tell O.R. Staff what her sugar is.

## 2012-06-27 NOTE — Progress Notes (Signed)
Pt. With eye and lip swelling from OR

## 2012-06-27 NOTE — Op Note (Signed)
Date of procedure: 06/27/2012   Date of dictation: Same  Service: Neurosurgery  Preoperative diagnosis: L2-3 and L3-4 and L5-S1 stenosis with neurogenic claudication affecting the L2-L3 L4-L5 and S1 nerve roots.  Status post previous L4-5 decompression and fusion.  Postoperative diagnosis: Same  Procedure Name: L2-3, L3-4, L5-S1 decompressive laminectomies (redo) with bilateral L2-L3 L4-L5 and S1 decompressive foraminotomies, more than would be required for simple interbody fusion alone.  L2-3, L3-4, L5-S1 posterior lumbar interbody fusion utilizing tangent interbody allograft wedge Telamon interbody peek cage and local autograft.  Reexploration of L4-5 posterior lateral fusion with removal of hardware, hardware no longer in service and necessitated removal to complete the operation.  L2-L3 L4-L5 and S1 posterior lateral arthrodesis utilizing segmental pedicle screw sedation and local autograft and bone graft extender.  Repair of inadvertent dural laceration with microdissection.    Surgeon:Sherman Donaldson A.Suzetta Timko, M.D.  Asst. Surgeon: Saintclair Halsted  Anesthesia: General  Indication: 79 year old female status post previous L4-5 decompression and fusion many years ago presents now with worsening back and bilateral lower extremity pain consistent with neurogenic claudication. Workup demonstrates evidence of marked stenosis at L2-3 and L3-4 with progressive disc space breakdown and severe neuroforaminal stenosis as well as severe spondylosis and stenosis at L5-S1. Fusion appears solid at L4-5. Patient presents now for three-level decompression and fusion. This will require reexploring her previous fusion. Previous instrumentation is no longer And stocking and necessitates complete removal.  Operative note: After induction of anesthesia. Patient positioned prone onto Wilson frame and appropriate padded. Lumbar region prepped and draped. Incision made extending from L2 down to S1. Subperiosteal dissection  performed bilaterally exposing the lamina and facet joints of L2-3-4 5 and S1 as well as the transverse processes of the aforementioned levels and the sacral ala. Deep self-retaining her placed intraoperative fluoroscopy is used levels were confirmed. Previously placed pedicle screw station L4-5 was dissected free and the SDRS instrumentation was disassembled. Rod the screws were completely removed. During the course of removal of the right-sided L5 pedicle screw the pedicle of L5 fractured. This prohibited replacement of screw this level. Decompressive laminectomies then performed using [place]ell orders Lorraine Lax is a high-speed drill to remove the entire residual lamina of L5 inferior facet of L5 bilaterally superior facets of S1 bilaterally. Wide decompressive foraminotomies were performed along the course exiting L5 and S1 nerve roots this point including resecting epidural scar and ligamentum flavum. Complete laminectomies of L2 and L3 were performed bilaterally as well as inferior facetectomies L2 and L3 and superior facetectomies of L3 and L4. Wide decompressive foraminotomies and portal source of the exiting L2-L3 and L4 nerve roots bilaterally.. The process of completely decompressing the thecal sac and right-sided L4 nerve root, a linear dural laceration was made which did not violate the arachnoid. In the epidural laceration was oversewn using 5-0 Prolene suture. This achieved a watertight closure. The spaces at L2-3 L3-4 and L5-S1 were all identified. Bilateral discectomies were performed. The spaces were then distracted. Starting first at L2-3 with the distractor less than patient's left side thecal sac and nerve respect on the right side. The spaces and reamed and cut with 8 mm tangent his peers soft tissues removed and interspace. 8 x 22 mm Telamon cage was impacted in place on the right side. This was packed morselize autograft. Distractors in patient's left side. Thecal sac or respect and left side. The  space was reamed and then cut with tangent his peers soft tissues removed and interspace. Morselize autograft packed interspace. A 8  x 22 mm Telamon cage was placed on this side as well secondary to anatomy not having enough space for tangent wedge on the left. The procedure was then repeated at L3-4 L5-S1 this time using 8 x 22 mm Telamon cage and an 8 x 26 over tangent wedge as well as morselized autograft. Pedicles of L2-L3 L4-L5 on the left and S1 bilaterally were then identified using surface landmarks and intraoperative fluoroscopy 2 partial bone was removed of the pedicle using high-speed drill pedicles then probed using pedicle awl each pedicle awl track was then tapped with a 5.25 m screw temperature Olson probed and found to be solidly within bone. 6.75 x 45 mm screws placed bilaterally at L4 6.75 x 40 mm screws placed in the left at L5 and 6.75 x 40 mm screws right-sided S1 in a bicortical fashion. 5.75 x 45 mm screws placed bilaterally at L2 and L3. Transverse processes of L2-3-4 5 and the sacroiliac then decorticated the high-speed drill. Morselized autograft packed posterolaterally along with some master Graf bone graft extender. Titanium rods were cut and contoured to fit over the screw heads. Locking caps and posterior screws were locking caps and engaged the construct under compression. Final images real good position the varus or proper level normal and spine. Wounds and irrigated one final time. At DuraSeal was placed over the dural repair. Gelfoam was placed over the laminectomy defect. Transverse connector was placed. Hemovac drain was left at per space. Wounds and close in layers with Vicryl sutures. Steri-Strips triggers were applied. There no pertinent patient. Patient will and she returns to the recovery postop.

## 2012-06-27 NOTE — Progress Notes (Signed)
Notified Andrea Wallace in Neuro OR that patient's blood sugar is 74 , she feels like she's starting to go down and Dr. Leda Quail said to send her to the OR and make sure that the transporter  and neuro OR is aware.

## 2012-06-28 LAB — TYPE AND SCREEN
ABO/RH(D): A POS
Antibody Screen: NEGATIVE
Unit division: 0
Unit division: 0

## 2012-06-28 LAB — GLUCOSE, CAPILLARY
Glucose-Capillary: 71 mg/dL (ref 70–99)
Glucose-Capillary: 145 mg/dL — ABNORMAL HIGH (ref 70–99)
Glucose-Capillary: 200 mg/dL — ABNORMAL HIGH (ref 70–99)

## 2012-06-28 LAB — BASIC METABOLIC PANEL
BUN: 29 mg/dL — ABNORMAL HIGH (ref 6–23)
Calcium: 8.5 mg/dL (ref 8.4–10.5)
GFR calc Af Amer: 66 mL/min — ABNORMAL LOW (ref >90)
GFR calc non Af Amer: 57 mL/min — ABNORMAL LOW (ref >90)
Glucose, Bld: 162 mg/dL — ABNORMAL HIGH (ref 70–99)
Sodium: 140 mEq/L (ref 135–145)

## 2012-06-28 LAB — CBC
Hemoglobin: 10.3 g/dL — ABNORMAL LOW (ref 12.0–15.0)
MCH: 29.8 pg (ref 26.0–34.0)
MCHC: 33.9 g/dL (ref 30.0–36.0)
RDW: 15.9 % — ABNORMAL HIGH (ref 11.5–15.5)

## 2012-06-28 MED FILL — Sodium Chloride Irrigation Soln 0.9%: Qty: 3000 | Status: AC

## 2012-06-28 MED FILL — Sodium Chloride IV Soln 0.9%: INTRAVENOUS | Qty: 1000 | Status: AC

## 2012-06-28 MED FILL — Heparin Sodium (Porcine) Inj 1000 Unit/ML: INTRAMUSCULAR | Qty: 30 | Status: AC

## 2012-06-28 NOTE — Progress Notes (Signed)
UR COMPLETED  

## 2012-06-28 NOTE — Evaluation (Signed)
Occupational Therapy Evaluation Patient Details Name: Andrea Wallace MRN: [id number] DOB: [birth year] Today's Date: 06/28/2012 Time: [id number] OT Time Calculation (min): 30 min  OT Assessment / Plan / Recommendation Clinical Impression  Pt s/p L2-3, L3-4, L5-S1 decompressive laminectomies (redo) with bilateral L2-L3 L4-L5 and S1 decompressive foraminotomies. Pt will benefit from skilled OT in the acute setting followed by HHOT at d/c to maximize safety and I with ADL and ADL mobility.     OT Assessment  Patient needs continued OT Services    Follow Up Recommendations  Home health OT;Supervision/Assistance - 24 hour    Barriers to Discharge      Equipment Recommendations   (TBD- may need shower seat)    Recommendations for Other Services    Frequency  Min 2X/week    Precautions / Restrictions Precautions Precautions: Back Required Braces or Orthoses: Spinal Brace Spinal Brace: Lumbar corset;Applied in sitting position Restrictions Weight Bearing Restrictions: No   Pertinent Vitals/Pain Pt reports back and incisional "discomfort" but did not otherwise report pain.     ADL  Eating/Feeding: Set up Where Assessed - Eating/Feeding: Chair Grooming: Minimal assistance Where Assessed - Grooming: Supported standing Upper Body Bathing: Minimal assistance Where Assessed - Upper Body Bathing: Supported sitting Lower Body Bathing: Moderate assistance Where Assessed - Lower Body Bathing: Supported sit to stand Upper Body Dressing: Minimal assistance Where Assessed - Upper Body Dressing: Supported sitting Lower Body Dressing: Maximal assistance Where Assessed - Lower Body Dressing: Supported sit to Lobbyist: Minimal Print production planner Method: Sit to Loss adjuster, chartered: Bedside commode Toileting - Clothing Manipulation and Hygiene: Moderate assistance Where Assessed - Toileting Clothing Manipulation and Hygiene: Sit to stand from 3-in-1 or  toilet Equipment Used: Rolling walker Transfers/Ambulation Related to ADLs: Min A with RW ambulation ~67ft forward and back ADL Comments: pt limited by fatigue    OT Diagnosis: Generalized weakness;Acute pain  OT Problem List: Decreased activity tolerance;Decreased knowledge of use of DME or AE;Decreased knowledge of precautions;Pain OT Treatment Interventions: Self-care/ADL training;DME and/or AE instruction;Therapeutic activities;Patient/family education;Balance training   OT Goals Acute Rehab OT Goals OT Goal Formulation: With patient/family Time For Goal Achievement: 07/05/12 Potential to Achieve Goals: Good ADL Goals Pt Will Perform Grooming: Independently;Standing at sink ADL Goal: Grooming - Progress: Goal set today Pt Will Perform Lower Body Bathing: with modified independence;Sit to stand from chair;Sit to stand in shower ADL Goal: Lower Body Bathing - Progress: Goal set today Pt Will Perform Lower Body Dressing: with min assist;Sit to stand from bed;Sit to stand from chair ADL Goal: Lower Body Dressing - Progress: Goal set today Pt Will Transfer to Toilet: with modified independence;Ambulation;with DME ADL Goal: Toilet Transfer - Progress: Goal set today Pt Will Perform Toileting - Clothing Manipulation: Independently;Standing ADL Goal: Toileting - Clothing Manipulation - Progress: Goal set today Pt Will Perform Toileting - Hygiene: with modified independence;with adaptive equipment;Sit to stand from 3-in-1/toilet ADL Goal: Toileting - Hygiene - Progress: Goal set today Pt Will Perform Tub/Shower Transfer: Tub transfer;with supervision;Ambulation;with DME ADL Goal: Tub/Shower Transfer - Progress: Goal set today Additional ADL Goal #1: Pt will I'ly don/doff back brace in prep for OOB activity. ADL Goal: Additional Goal #1 - Progress: Goal set today Additional ADL Goal #2: Pt will verbalize/generalize 3/3 back precautions for use with functional activities. ADL Goal: Additional  Goal #2 - Progress: Goal set today  Visit Information  Last OT Received On: 06/28/12 Assistance Needed: +1    Subjective Data  Subjective: My legs  are weak Patient Stated Goal: Return home with husband   Prior Functioning     Home Living Lives With: Spouse Available Help at Discharge: Family;Available 24 hours/day Type of Home: House Home Access: Stairs to enter [employer] of Steps: 1 Entrance Stairs-Rails: None Home Layout: One level Bathroom Shower/Tub: Tub/shower unit;Door [employer] Toilet: Standard Home Adaptive Equipment: Bedside commode/3-in-1;Walker - rolling (husband to check on) Prior Function Level of Independence: Independent Able to Take Stairs?: Reciprically Vocation: Retired Corporate investment banker: No difficulties Dominant Hand: Right         Vision/Perception Vision - History Patient Visual Report: No change from baseline   Cognition  Cognition Overall Cognitive Status: Appears within functional limits for tasks assessed/performed Arousal/Alertness: Awake/alert Orientation Level: Appears intact for tasks assessed Behavior During Session: [facility] for tasks performed    Extremity/Trunk Assessment Right Upper Extremity Assessment RUE ROM/Strength/Tone: [facility] for tasks assessed Left Upper Extremity Assessment LUE ROM/Strength/Tone: WFL for tasks assessed     Mobility Bed Mobility Bed Mobility: Rolling Right;Right Sidelying to Sit Rolling Right: 4: Min assist Right Sidelying to Sit: 3: Mod assist;HOB flat Details for Bed Mobility Assistance: VC and Min A for log rolling technique Transfers Transfers: Sit to Stand;Stand to Sit Sit to Stand: 4: Min guard;From bed Stand to Sit: With armrests;To chair/3-in-1;4: Min assist Details for Transfer Assistance: VC for hand placement. Min A to achieve upright standing     Exercise     Balance     End of Session OT - End of Session Equipment Utilized During Treatment: Back brace Activity  Tolerance: Patient limited by fatigue Patient left: in chair;with call bell/phone within reach;with family/visitor present Nurse Communication: Mobility status  GO     Norleen Xie 06/28/2012, 10:41 AM

## 2012-06-28 NOTE — Progress Notes (Signed)
Postop day 1. Pain well controlled. Pain confined to her lumbar region. Denies any leg pain. No headache. No shortness of breath or chest pain.  Heart rate and blood pressure stable. Urine output good. Drain output low. Awake and alert. Oriented and appropriate. Cranial nerve function intact. Motor and sensory function of her extremities normal. Wound clean dry and intact.  Hematocrit 30.4 this morning. Electrolytes okay.  Doing well. Continue ICU observation till tomorrow morning. Recheck labs in morning. Continue efforts at mobilization.

## 2012-06-28 NOTE — Evaluation (Signed)
Physical Therapy Evaluation Patient Details Name: Andrea Wallace MRN: [id number] DOB: 29-Oct-1931 Today's Date: 06/28/2012 Time: [id number] PT Time Calculation (min): 23 min  PT Assessment / Plan / Recommendation Clinical Impression  Pt s/p L2-3, L3-4, L5-S1 decompressive laminectomies (redo) with bilateral L2-L3 L4-L5 and S1 decompressive foraminotomies. Pt will benefit from acute PT services to improve overall mobility to prepare for safe d/c home with husband.    PT Assessment  Patient needs continued PT services    Follow Up Recommendations  Home health PT;Supervision/Assistance - 24 hour    Barriers to Discharge None      Equipment Recommendations  Rolling walker with 5" wheels    Frequency Min 5X/week    Precautions / Restrictions Precautions Precautions: Back Precaution Comments: Educated on 3/3 back precautions Required Braces or Orthoses: Spinal Brace Spinal Brace: Lumbar corset;Applied in sitting position Restrictions Weight Bearing Restrictions: No   Pertinent Vitals/Pain Pt reports back and incisional "discomfort" but did not otherwise report pain.        Mobility  Bed Mobility Bed Mobility: Rolling Right;Right Sidelying to Sit Rolling Right: 4: Min assist Right Sidelying to Sit: 3: Mod assist;HOB flat Details for Bed Mobility Assistance: VC and Min A for log rolling technique Transfers Transfers: Sit to Stand;Stand to Sit Sit to Stand: 4: Min guard;From bed Stand to Sit: With armrests;To chair/3-in-1;4: Min assist Details for Transfer Assistance: VC for hand placement. Min A to achieve upright standing Ambulation/Gait Ambulation/Gait Assistance: 4: Min assist Ambulation Distance (Feet): 8 Feet Assistive device: Rolling walker Ambulation/Gait Assistance Details: (A) to maintain balance with max cues for RW placement and body position within RW. Gait Pattern: Step-through pattern;Decreased stride length;Shuffle;Antalgic Gait velocity: decreased Stairs:  No Wheelchair Mobility Wheelchair Mobility: No     PT Diagnosis: Difficulty walking;Generalized weakness;Acute pain  PT Problem List: Decreased activity tolerance;Decreased strength;Decreased balance;Decreased mobility;Decreased knowledge of use of DME;Pain;Decreased knowledge of precautions PT Treatment Interventions: DME instruction;Gait training;Stair training;Functional mobility training;Therapeutic activities;Therapeutic exercise;Balance training;Neuromuscular re-education;Patient/family education   PT Goals Acute Rehab PT Goals PT Goal Formulation: With patient Time For Goal Achievement: 07/12/12 Potential to Achieve Goals: Good Pt will Roll Supine to Right Side: with modified independence PT Goal: Rolling Supine to Right Side - Progress: Goal set today Pt will go Supine/Side to Sit: with modified independence PT Goal: Supine/Side to Sit - Progress: Goal set today Pt will go Sit to Supine/Side: with modified independence PT Goal: Sit to Supine/Side - Progress: Goal set today Pt will go Sit to Stand: with modified independence PT Goal: Sit to Stand - Progress: Goal set today Pt will go Stand to Sit: with modified independence PT Goal: Stand to Sit - Progress: Goal set today Pt will Ambulate: >150 feet;with modified independence;with rolling walker PT Goal: Ambulate - Progress: Goal set today Pt will Go Up / Down Stairs: 1-2 stairs;with min assist;with least restrictive assistive device PT Goal: Up/Down Stairs - Progress: Goal set today  Visit Information  Last PT Received On: 06/28/12 Assistance Needed: +1 PT/OT Co-Evaluation/Treatment: Yes    Subjective Data  Subjective: "I guess I can get up." Patient Stated Goal: To go home with husband   Prior Functioning  Home Living Lives With: Spouse Available Help at Discharge: Family;Available 24 hours/day Type of Home: House Home Access: Stairs to enter [employer] of Steps: 1 Entrance Stairs-Rails: None Home  Layout: One level Bathroom Shower/Tub: Tub/shower unit;Door [employer] Toilet: Standard Home Adaptive Equipment: Bedside commode/3-in-1;Walker - rolling (husband to check on) Prior Function Level of  Independence: Independent Able to Take Stairs?: Reciprically Vocation: Retired Corporate investment banker: No difficulties Dominant Hand: Right    Cognition  Cognition Overall Cognitive Status: Appears within functional limits for tasks assessed/performed Arousal/Alertness: Awake/alert Orientation Level: Appears intact for tasks assessed Behavior During Session: [facility] for tasks performed    Extremity/Trunk Assessment Right Upper Extremity Assessment RUE ROM/Strength/Tone: [facility] for tasks assessed Left Upper Extremity Assessment LUE ROM/Strength/Tone: WFL for tasks assessed Right Lower Extremity Assessment RLE ROM/Strength/Tone: Unable to fully assess;Due to pain;Due to precautions (At least 3/5 grossly) Left Lower Extremity Assessment LLE ROM/Strength/Tone: Unable to fully assess;Due to pain;Due to precautions (At least 3/5 grossly)   Balance Balance Balance Assessed: Yes Static Sitting Balance Static Sitting - Balance Support: Feet supported Static Sitting - Level of Assistance: 5: Stand by assistance Static Sitting - Comment/# of Minutes: ~5 minutes  End of Session PT - End of Session Equipment Utilized During Treatment: Gait belt;Back brace Activity Tolerance: Patient tolerated treatment well Patient left: in chair;with call bell/phone within reach Nurse Communication: Mobility status;Precautions  GP     Nitish Roes 06/28/2012, 10:55 AM Antoine Poche, PT DPT [phone]

## 2012-06-28 NOTE — Clinical Social Work Note (Signed)
Clinical Social Worker received referral for possible ST-SNF placement.  Chart reviewed.  PT/OT recommending home with home health and 24 hour supervision.  CSW spoke with patient at bedside who states her husband will be at home 24 hours/day for assistance.  Patient has received home health before and is agreeable to their return.  Patient states she is agreeable with [facility] and may need a new rolling walker at discharge.  Spoke with RN Case Manager to provide information, who will follow up with patient to discuss home health needs.    CSW signing off - please re consult if social work needs arise.  Barbette Or, [place]

## 2012-06-29 DIAGNOSIS — D62 Acute posthemorrhagic anemia: Secondary | ICD-10-CM

## 2012-06-29 DIAGNOSIS — E119 Type 2 diabetes mellitus without complications: Secondary | ICD-10-CM | POA: Diagnosis present

## 2012-06-29 DIAGNOSIS — M48062 Spinal stenosis, lumbar region with neurogenic claudication: Secondary | ICD-10-CM | POA: Diagnosis present

## 2012-06-29 LAB — CBC
HCT: 28.5 % — ABNORMAL LOW (ref 36.0–46.0)
Platelets: 141 10*3/uL — ABNORMAL LOW (ref 150–400)
RDW: 16 % — ABNORMAL HIGH (ref 11.5–15.5)
WBC: 9.5 10*3/uL (ref 4.0–10.5)

## 2012-06-29 LAB — BASIC METABOLIC PANEL
BUN: 28 mg/dL — ABNORMAL HIGH (ref 6–23)
Chloride: 103 mEq/L (ref 96–112)
GFR calc Af Amer: 54 mL/min — ABNORMAL LOW (ref >90)
Potassium: 4.5 mEq/L (ref 3.5–5.1)

## 2012-06-29 NOTE — Progress Notes (Signed)
Postop day 2. Patient complains of back pain. Notes that she has been up multiple times over night to urinate. No radiating pain into her legs. No significant headache.  She is afebrile. Heart rate and blood pressure stable. Urine output high. Hemovac output still very significant. On examination she is awake and alert. She is oriented and appropriate. Motor examination is intact. Sensory examination is intact. Wound dressing is clean and dry. Chest and abdomen are benign.  Hematocrit 25.8 this morning electrolytes stable.  Mobilizing fluid following her multilevel decompression and fusion. Overall doing well. We'll transfer to floor. Continue therapy.

## 2012-06-29 NOTE — Progress Notes (Signed)
Occupational Therapy Treatment Patient Details Name: Andrea Wallace MRN: [id number] DOB: [birth year] Today's Date: 06/29/2012 Time: 0921-1000 OT Time Calculation (min): 39 min  OT Assessment / Plan / Recommendation Comments on Treatment Session Pt progressing well, but she and her husband feel as though d/c to SNF will be a safer option at this time.     Follow Up Recommendations  SNF    Barriers to Discharge       Equipment Recommendations  None recommended by OT    Recommendations for Other Services    Frequency Min 2X/week   Plan Discharge plan needs to be updated    Precautions / Restrictions Precautions Precautions: Back Precaution Booklet Issued: Yes (comment) Precaution Comments: Reviewed back precautions and needed reeducated on no twisting and no arching.  Husband present to assist pt. Required Braces or Orthoses: Spinal Brace Spinal Brace: Lumbar corset;Applied in sitting position (required Min A) Restrictions Weight Bearing Restrictions: No   Pertinent Vitals/Pain Pt reports "soreness" in back but did not rate.    ADL  Grooming: Min guard Where Assessed - Grooming: Supported standing Lower Body Dressing: Maximal assistance Where Assessed - Lower Body Dressing: Supported sit to Lobbyist: Minimal Print production planner Method: Sit to Loss adjuster, chartered: Regular height toilet;Grab bars Toileting - Clothing Manipulation and Hygiene: Moderate assistance Where Assessed - Toileting Clothing Manipulation and Hygiene: Sit to stand from 3-in-1 or toilet Equipment Used: Rolling walker;Back brace Transfers/Ambulation Related to ADLs: Min guard A with RW ambulation throughout room and hallway.  ADL Comments: spoke at length with husband and pt regarding d/c plan. they prefer SNF at this time as husband is cognizant he cannot provide more than minimal assist to patient at home    OT Diagnosis:    OT Problem List:   OT Treatment Interventions:      OT Goals ADL Goals Pt Will Perform Grooming: Independently;Standing at sink ADL Goal: Grooming - Progress: Progressing toward goals Pt Will Perform Lower Body Bathing: with modified independence;Sit to stand from chair;Sit to stand in shower Pt Will Perform Lower Body Dressing: with min assist;Sit to stand from bed;Sit to stand from chair ADL Goal: Lower Body Dressing - Progress: Progressing toward goals Pt Will Transfer to Toilet: with modified independence;Ambulation;with DME ADL Goal: Toilet Transfer - Progress: Progressing toward goals Pt Will Perform Toileting - Clothing Manipulation: Independently;Standing ADL Goal: Toileting - Clothing Manipulation - Progress: Progressing toward goals Pt Will Perform Toileting - Hygiene: with modified independence;with adaptive equipment;Sit to stand from 3-in-1/toilet ADL Goal: Toileting - Hygiene - Progress: Progressing toward goals Pt Will Perform Tub/Shower Transfer: Tub transfer;with supervision;Ambulation;with DME Additional ADL Goal #1: Pt will I'ly don/doff back brace in prep for OOB activity. ADL Goal: Additional Goal #1 - Progress: Progressing toward goals Additional ADL Goal #2: Pt will verbalize/generalize 3/3 back precautions for use with functional activities. ADL Goal: Additional Goal #2 - Progress: Progressing toward goals  Visit Information  Last OT Received On: 06/29/12 Assistance Needed: +1    Subjective Data      Prior Functioning       Cognition  Cognition Overall Cognitive Status: Appears within functional limits for tasks assessed/performed Arousal/Alertness: Awake/alert Orientation Level: Appears intact for tasks assessed Behavior During Session: [facility] for tasks performed    Mobility  Bed Mobility Bed Mobility: Rolling Right;Right Sidelying to Sit Rolling Right: 4: Min assist Right Sidelying to Sit: 4: Min assist Details for Bed Mobility Assistance: (A) to complete roll and max cues for  proper technique to  prevent twisting.   Transfers Sit to Stand: From bed;4: Min assist;From toilet Stand to Sit: With armrests;To chair/3-in-1;4: Min assist;To toilet Details for Transfer Assistance: pt requires more assist from lower surface    Exercises      Balance     End of Session OT - End of Session Equipment Utilized During Treatment: Back brace Activity Tolerance: Patient tolerated treatment well Patient left: in chair;with call bell/phone within reach;with family/visitor present Nurse Communication: Mobility status  GO     Andrea Wallace 06/29/2012, 3:20 PM

## 2012-06-29 NOTE — Progress Notes (Signed)
Physical Therapy Treatment Patient Details Name: Andrea Wallace MRN: [id number] DOB: [birth year] Today's Date: 06/29/2012 Time: 0920-0958 PT Time Calculation (min): 38 min  PT Assessment / Plan / Recommendation Comments on Treatment Session  Pt continues to show progress and able to increase ambulation distance and overall mobility.  Discussed with pt and pt's husband about d/c plans and both agreed the need for further therapy prior to d/c home for pt to improve independence.  Pt's husband only able to provide supervision.     Follow Up Recommendations  Supervision/Assistance - 24 hour;SNF     Equipment Recommendations  Rolling walker with 5" wheels    Frequency Min 5X/week   Plan Frequency remains appropriate;Discharge plan needs to be updated    Precautions / Restrictions Precautions Precautions: Back Precaution Booklet Issued: Yes (comment) Precaution Comments: Reviewed back precautions and needed reeducated on no twisting and no arching.  Husband present to assist pt. Required Braces or Orthoses: Spinal Brace Spinal Brace: Lumbar corset;Applied in sitting position Restrictions Weight Bearing Restrictions: No   Pertinent Vitals/Pain C/o pain all over 8/10; premedicated; pt overall pain decreased to 6-7/10 after mobility    Mobility  Bed Mobility Bed Mobility: Rolling Right;Right Sidelying to Sit Rolling Right: 4: Min assist Right Sidelying to Sit: 4: Min assist Details for Bed Mobility Assistance: (A) to complete roll and max cues for proper technique to prevent twisting.   Transfers Transfers: Sit to Stand;Stand to Sit Sit to Stand: From bed;4: Min assist;From toilet Stand to Sit: With armrests;To chair/3-in-1;4: Min assist;To toilet Details for Transfer Assistance: Minguard for safety with max cues for hand placement.  (A) to initiate transfer and slowly descend to chair with max cues. Ambulation/Gait Ambulation/Gait Assistance: 4: Min guard Ambulation Distance  (Feet): 100 Feet Assistive device: Rolling walker Ambulation/Gait Assistance Details: Minguard for safety with max cues for RW placement and body position within RW. Gait Pattern: Step-through pattern;Decreased stride length;Shuffle;Antalgic Gait velocity: decreased Stairs: No Wheelchair Mobility Wheelchair Mobility: No    Exercises     PT Diagnosis:    PT Problem List:   PT Treatment Interventions:     PT Goals Acute Rehab PT Goals PT Goal Formulation: With patient Time For Goal Achievement: 07/12/12 Potential to Achieve Goals: Good Pt will Roll Supine to Right Side: with modified independence PT Goal: Rolling Supine to Right Side - Progress: Progressing toward goal Pt will go Supine/Side to Sit: with modified independence PT Goal: Supine/Side to Sit - Progress: Progressing toward goal Pt will go Sit to Supine/Side: with modified independence PT Goal: Sit to Supine/Side - Progress: Progressing toward goal Pt will go Sit to Stand: with modified independence PT Goal: Sit to Stand - Progress: Progressing toward goal Pt will go Stand to Sit: with modified independence PT Goal: Stand to Sit - Progress: Progressing toward goal Pt will Ambulate: >150 feet;with modified independence;with rolling walker PT Goal: Ambulate - Progress: Progressing toward goal  Visit Information  Last PT Received On: 06/29/12 Assistance Needed: +1 PT/OT Co-Evaluation/Treatment: Yes    Subjective Data  Subjective: "I don't mind getting up.' Patient Stated Goal: To go to rehab before going home   Cognition  Cognition Overall Cognitive Status: Appears within functional limits for tasks assessed/performed Arousal/Alertness: Awake/alert Orientation Level: Appears intact for tasks assessed Behavior During Session: [facility] for tasks performed    Balance     End of Session PT - End of Session Equipment Utilized During Treatment: Gait belt;Back brace Activity Tolerance: Patient tolerated treatment  well  Patient left: in chair;with call bell/phone within reach Nurse Communication: Mobility status;Precautions   GP     Tavarious Freel 06/29/2012, 12:58 PM. Antoine Poche, PT DPT [phone]

## 2012-06-30 LAB — GLUCOSE, CAPILLARY

## 2012-06-30 MED ORDER — GABAPENTIN 600 MG PO TABS
600.0000 mg | ORAL_TABLET | Freq: Three times a day (TID) | ORAL | Status: DC
  Administered 2012-06-30 – 2012-07-01 (×3): 600 mg via ORAL
  Filled 2012-06-30 (×5): qty 1

## 2012-06-30 MED ORDER — GABAPENTIN 600 MG PO TABS: 1200.0000 mg | ORAL_TABLET | Freq: Three times a day (TID) | ORAL | Status: DC

## 2012-06-30 NOTE — Progress Notes (Signed)
Physical Therapy Treatment Patient Details Name: Andrea Wallace MRN: [id number] DOB: 1931-12-24 Today's Date: 06/30/2012 Time: 0802-0827 PT Time Calculation (min): 25 min  PT Assessment / Plan / Recommendation Comments on Treatment Session  Pt moving well, but she & her husband cont to feel ST-SNF best d/c plan to maximize safety & (I) prior to d/c home.      Follow Up Recommendations  SNF;Home health PT;Supervision/Assistance - 24 hour (Pt requesting SNF)     Does the patient have the potential to tolerate intense rehabilitation     Barriers to Discharge        Equipment Recommendations  Rolling walker with 5" wheels    Recommendations for Other Services    Frequency Min 5X/week   Plan      Precautions / Restrictions Precautions Precautions: Back Precaution Comments: Pt able to recall 2/3 back precautions.  Reviewed all 3.   Required Braces or Orthoses: Spinal Brace Spinal Brace: Lumbar corset;Applied in sitting position Restrictions Weight Bearing Restrictions: No   Pertinent Vitals/Pain 5/10 back     Mobility  Bed Mobility Bed Mobility: Rolling Left;Left Sidelying to Sit;Sitting - Scoot to Edge of Bed Rolling Left: 4: Min guard;With rail Left Sidelying to Sit: 4: Min guard;With rails;HOB flat Details for Bed Mobility Assistance: Cues to reinforce back precautions.  Relied heavily on rail to push herself up to sitting.   Transfers Transfers: Sit to Stand;Stand to Sit Sit to Stand: 4: Min guard;With upper extremity assist;From bed;From toilet Stand to Sit: 4: Min guard;With upper extremity assist;With armrests;To chair/3-in-1;To toilet Details for Transfer Assistance: Cues for back precautions (decrease trunk flexion), & cues for hand placement.   Ambulation/Gait Ambulation/Gait Assistance: 5: Supervision Ambulation Distance (Feet): 120 Feet Assistive device: Rolling walker Ambulation/Gait Assistance Details: Cues for tall posture, to look up for increased safety  awareness of environment, & body positioning inside RW.   Gait Pattern: Step-through pattern;Decreased stride length;Decreased hip/knee flexion - right;Decreased hip/knee flexion - left;Shuffle Gait velocity: decreased Stairs: No Wheelchair Mobility Wheelchair Mobility: No        PT Goals Acute Rehab PT Goals Time For Goal Achievement: 07/12/12 Potential to Achieve Goals: Good Pt will Roll Supine to Right Side: with modified independence Pt will go Supine/Side to Sit: with modified independence PT Goal: Supine/Side to Sit - Progress: Progressing toward goal Pt will go Sit to Supine/Side: with modified independence Pt will go Sit to Stand: with modified independence PT Goal: Sit to Stand - Progress: Progressing toward goal Pt will go Stand to Sit: with modified independence PT Goal: Stand to Sit - Progress: Progressing toward goal Pt will Ambulate: >150 feet;with modified independence;with rolling walker PT Goal: Ambulate - Progress: Progressing toward goal Pt will Go Up / Down Stairs: 1-2 stairs;with min assist;with least restrictive assistive device  Visit Information  Last PT Received On: 06/30/12 Assistance Needed: +1    Subjective Data      Cognition  Cognition Overall Cognitive Status: Appears within functional limits for tasks assessed/performed Arousal/Alertness: Awake/alert Orientation Level: Appears intact for tasks assessed Behavior During Session: [facility] for tasks performed    Balance     End of Session PT - End of Session Equipment Utilized During Treatment: Gait belt;Back brace Activity Tolerance: Patient tolerated treatment well Patient left: in chair;with call bell/phone within reach;with family/visitor present Nurse Communication: Mobility status    Sarajane Marek, [place] [phone] 06/30/2012

## 2012-06-30 NOTE — Progress Notes (Signed)
Postop day 3. Overall patient looks better. Pain reasonably well-controlled. She denies any radicular pain numbness or weakness. She is voiding well.  Afebrile. Vital stable. Drain output lessening. Motor sensory exam intact. Wound clean and dry. Chest and abdomen benign.  Progressing well. Continue rehabilitation efforts. Patient requests skilled nursing facility bridge prior to home discharge. Social work to me with patient today with regard to discharge plan. Patient will be ready for discharge to skilled nursing facility as early as tomorrow.

## 2012-06-30 NOTE — Clinical Social Work Psychosocial (Signed)
     Clinical Social Work Department BRIEF PSYCHOSOCIAL ASSESSMENT 06/30/2012  Patient:  Andrea Wallace, Andrea Wallace     Account Number:  [id number]     Admit date:  06/27/2012  Clinical Social Worker:  Lehman Prom  Date/Time:  06/30/2012 01:13 PM  Referred by:  Physician  Date Referred:  06/30/2012 Referred for  SNF Placement   Other Referral:   Interview type:  Family Other interview type:    PSYCHOSOCIAL DATA Living Status:  HUSBAND Admitted from facility:   Level of care:   Primary support name:  Zudora Schiferl Primary support relationship to patient:  SPOUSE Degree of support available:   supportive    CURRENT CONCERNS Current Concerns  Post-Acute Placement   Other Concerns:    SOCIAL WORK ASSESSMENT / PLAN CSW met with pt and husband to address consult. CSW introduced herself and explained role of social work. CSW also explained process of discharging to SNF.    Pt lives with husand and would need more assistance prior to discharging.    CSW will initiate SNF search and follow up with bed offers. CSW will continue to follow.   Assessment/plan status:  Psychosocial Support/Ongoing Assessment of Needs Other assessment/ plan:   Information/referral to community resources:   SNF list    PATIENTS/FAMILYS RESPONSE TO PLAN OF CARE: Pt and husband are agreeable to discharge plan plan to SNF.

## 2012-06-30 NOTE — Clinical Social Work Placement (Addendum)
    Clinical Social Work Department CLINICAL SOCIAL WORK PLACEMENT NOTE 06/30/2012  Patient:  Andrea Wallace, Andrea Wallace  Account Number:  [id number] Admit date:  06/27/2012  Clinical Social Worker:  Lehman Prom  Date/time:  06/30/2012 01:22 PM  Clinical Social Work is seeking post-discharge placement for this patient at the following level of care:   SKILLED NURSING   (*CSW will update this form in Epic as items are completed)   06/30/2012  Patient/family provided with [facility] Department of Clinical Social Work's list of facilities offering this level of care within the geographic area requested by the patient (or if unable, by the patient's family).  06/30/2012  Patient/family informed of their freedom to choose among providers that offer the needed level of care, that participate in Medicare, Medicaid or managed care program needed by the patient, have an available bed and are willing to accept the patient.  06/30/2012  Patient/family informed of [facility]' ownership interest in [facility], as well as of the fact that they are under no obligation to receive care at this facility.  PASARR submitted to EDS on 06/30/2012 PASARR number received from E[employer] on 06/30/2012  FL2 transmitted to all facilities in geographic area requested by pt/family on  06/30/2012 FL2 transmitted to all facilities within larger geographic area on   Patient informed that his/her managed care company has contracts with or will negotiate with  certain facilities, including the following:     Patient/family informed of bed offers received:  07/01/2012 (js) Patient chooses bed at  [facility] Physician recommends and patient chooses bed at    Patient to be transferred to [facility] on 07/01/2012   Patient to be transferred to facility by [facility]  The following physician request were entered in Epic:   Additional Comments:

## 2012-07-01 LAB — GLUCOSE, CAPILLARY: Glucose-Capillary: 172 mg/dL — ABNORMAL HIGH (ref 70–99)

## 2012-07-01 MED ORDER — DIAZEPAM 5 MG PO TABS: 5.0000 mg | ORAL_TABLET | Freq: Four times a day (QID) | ORAL | Status: DC | PRN

## 2012-07-01 MED ORDER — OXYCODONE-ACETAMINOPHEN 5-325 MG PO TABS: 1.0000 | ORAL_TABLET | ORAL | Status: DC | PRN

## 2012-07-01 NOTE — Discharge Summary (Signed)
Physician Discharge Summary  Patient ID: Andrea Wallace MRN: [id number] DOB/AGE: [birth year] 79 y.o.  Admit date: 06/27/2012 Discharge date: 07/01/2012  Admission Diagnoses:  Discharge Diagnoses:  Principal Problem:  *Lumbar stenosis with neurogenic claudication Active Problems:  Diabetes  Anemia associated with acute blood loss   Discharged Condition: good  Hospital Course: Patient in the hospital where she underwent an uncomplicated three-level lumbar decompression and fusion. Postoperatively she is done well. Preoperative back and lower extremity pain are improved. Her strength station are intact. She is mobilizing with therapy. Plan is for discharge to skilled nursing facility for further convalescence.  Consults:   Significant Diagnostic Studies:   Treatments:   Discharge Exam: Blood pressure 116/57, pulse 72, temperature 99.2 F (37.3 C), temperature source Oral, resp. rate 19, height 5\' 5"  (1.651 m), weight 83.3 kg (183 lb 10.3 oz), SpO2 96.00%. She is awake and alert. She is oriented and appropriate. Cranial nerve function is intact. Motor and sensory function of the extremities is normal. Wound clean and dry. Chest and abdomen benign.  Disposition: 01-Home or Self Care     Medication List     As of 07/01/2012  8:43 AM    TAKE these medications         B-D UF III MINI PEN NEEDLES 31G X 5 MM Misc   Generic drug: Insulin Pen Needle   daily.      CRESTOR 40 MG tablet   Generic drug: rosuvastatin   Take 40 mg by mouth daily.      diazepam 5 MG tablet   Commonly known as: VALIUM   Take 1-2 tablets (5-10 mg total) by mouth every 6 (six) hours as needed.      exenatide 5 MCG/0.02ML Soln   Commonly known as: BYETTA   Inject 5 mcg into the skin daily with breakfast.      felodipine 5 MG 24 hr tablet   Commonly known as: PLENDIL   Take 5 mg by mouth daily.      gabapentin 600 MG tablet   Commonly known as: NEURONTIN   Take 600 mg by mouth 3 (three) times daily.       HUMALOG 100 UNIT/ML injection   Generic drug: insulin lispro   Inject 30-45 Units into the skin 3 (three) times daily before meals. 40 in the am, 30 at lunch, 45 units at bedtime      HYDROcodone-acetaminophen 10-325 MG per tablet   Commonly known as: NORCO   Take 1 tablet by mouth at bedtime as needed. For pain      LANTUS 100 UNIT/ML injection   Generic drug: insulin glargine   Inject 30 Units into the skin 2 (two) times daily.      montelukast 10 MG tablet   Commonly known as: SINGULAIR   Take 10 mg by mouth at bedtime.      multivitamin with minerals Tabs   Take 1 tablet by mouth daily.      omeprazole 20 MG capsule   Commonly known as: PRILOSEC   Take 20 mg by mouth daily as needed. For acid reflux      oxyCODONE-acetaminophen 5-325 MG per tablet   Commonly known as: PERCOCET/ROXICET   Take 1-2 tablets by mouth every 4 (four) hours as needed.      propranolol ER 120 MG 24 hr capsule   Commonly known as: INDERAL LA   Take 120 mg by mouth daily.      traMADol 50 MG tablet  Commonly known as: ULTRAM   Take 50 mg by mouth every 6 (six) hours as needed. For pain      TRICOR 145 MG tablet   Generic drug: fenofibrate   Take 145 mg by mouth every evening.      VITAMIN B-12 PO   Take 1 tablet by mouth daily.      VITAMIN D PO   Take 1 tablet by mouth daily.           Follow-up Information    Follow up with Lyndell Allaire A, MD. Call in 1 week. (Ask for Wells Guiles)    Contact information:   [address] [place] [address] [phone]          Signed: Charlie Pitter 07/01/2012, 8:43 AM

## 2012-07-01 NOTE — Progress Notes (Signed)
Report called to [facility] Pouch nurse; questions answered. Pt transferred out unit via wheelchair, spouse transporting to SNF in good and stable condition.

## 2012-07-01 NOTE — Progress Notes (Signed)
Patient ID: Andrea Wallace, female   DOB: 1931/11/08, 79 y.o.   MRN: [id number] Postop day 4. Pain recently well-controlled. No other complaints. No headache.  Afebrile. Vitals are stable. Urine output good. Drain output significantly less. Motor and sensory function intact. Wound clean and dry.  Progressing well. Possible discharge today depending on SNF bed availability.

## 2012-07-01 NOTE — Progress Notes (Signed)
Patient refuses side to side repositioning.  She only wants to lay on her back with two pillows under her knees.  Will continue to encourage repositioning and will monitor patient.  Kizzie Bane, RN

## 2012-07-01 NOTE — Clinical Social Work Note (Signed)
Clinical Social Worker facilitated patient discharge including providing patient husband with bed offers and communicating family wishes to facility.  Patient family has chosen [facility] who will offer a private room.  Patient family has accepted bed offer and has chosen to provide transportation to [facility].  RN to call report prior to discharge.    Clinical Social Worker will sign off for now as social work intervention is no longer needed. Please consult [place] again if new need arises.  Barbette Or, [place]

## 2012-07-04 ENCOUNTER — Telehealth: Payer: Self-pay | Admitting: Family Medicine

## 2012-07-04 DIAGNOSIS — I10 Essential (primary) hypertension: Secondary | ICD-10-CM

## 2012-07-04 DIAGNOSIS — M48062 Spinal stenosis, lumbar region with neurogenic claudication: Secondary | ICD-10-CM

## 2012-07-04 DIAGNOSIS — E119 Type 2 diabetes mellitus without complications: Secondary | ICD-10-CM

## 2012-07-04 DIAGNOSIS — E785 Hyperlipidemia, unspecified: Secondary | ICD-10-CM

## 2012-07-04 NOTE — Telephone Encounter (Signed)
Not sure if this is Dr.Letvak's patient? Looks like she was admitted on 07/02/2011 to facility. Dr.Aron do you know anything about this patient?

## 2012-07-04 NOTE — Telephone Encounter (Signed)
[facility] Triage Call Report Triage Record Num: [id number] Operator: Doug Sou Patient Name: Andrea Wallace Call Date & Time: 07/02/2012 4:32:17AM Patient Phone: [phone] PCP: Viviana Simpler Patient Gender: Female PCP Fax : [phone] Patient DOB: Aug 29, 1931 [facility] Name: [facility] Reason for Call: Caller: Marie/LPN; PCP: Viviana Simpler (Family Practice); CB#: [phone]; Call regarding fever of 101.3 on 07/02/12. Rechecked temperature and it was 99.1 at 4:50 am. Caller states she gave Tylenol 650 mg. States at 3:40 am the patient was groggy and the patient's blood sugar was 43 and states she gave the patient orange juice with sugar and states the oxygen saturation was 86% and o2 was started at 2 liters per minute. Rechecked blood sugar at 4:15 am and it was 61 and states she gave more orange juice and rechecked blood sugar at 4:50 am and it was 96. States the oxygen sat was 99% at 4:50 am and the patient was more alert. Caller states the patient has not had a bm in 12 days and states on 07/01/12 the patient had a Dulcolax Supp and Milk of Magnesia. States the abdomen is large but it is soft. Caller requesting medication for constipation, orders for accuchecks and stool softener. Notified Dr. Glori Bickers and orders given for Tylenol 325 mg take 1 to 2 q4h prn. Order also given for accucheck ac and hs and prn. Order given for Colace 100 mg BID. Order also given to call if no BM today. Caller made aware of orders and agreed. Per standing orders order given for CBC, BMP, u/a and urine C&S, order also given for Rocephin 1 GRAM IM x 3 days and may dilute with Xylocaine 1% plain and order given for Ceftin 500 mg po BID x 3 days. Caller made aware and agreed. Triaged per Fever-Adult guideline. To call provider within 4 hours due to less than 4 weeks postoperative or post other invasive procedure. Protocol(s) Used: Fever - Adult Recommended Outcome per Protocol: Call Provider within  4 Hours Reason for Outcome: Less than 4 weeks postoperative or post other invasive procedure Care Advice: ~ SYMPTOM / CONDITION MANAGEMENT ~ If provider's specified instructions initiated but symptoms continue or are getting worse, call provider. 07/02/2012 5:34:44AM Page 1 of 1 [email or web address]

## 2012-07-04 NOTE — Telephone Encounter (Signed)
[facility] Triage Call Report Triage Record Num: [id number] Operator: Marshell Garfinkel Patient Name: Andrea Wallace Call Date & Time: 07/02/2012 9:27:11PM Patient Phone: [phone] PCP: Viviana Simpler Patient Gender: Female PCP Fax : [phone] Patient DOB: Dec 29, 1931 Practice Name: [facility] Reason for Call: Caller: Cathy/Lpn; PCP: Viviana Simpler (Family Practice); CB#: [phone]; Call regarding Order verification; Staff Tye [place] calling from [facility]. See notes from early am and yesterday. Staff wanting to confirm that Dr. Glori Bickers wants both the Rocephin 1gm IM given daily x 3 and the Ceftin 500mg  po BID x 3 days. RN checked orders and advised yes/it is clearly documented. Also the staff were concerned with the blood sugar dropping and have held the pts Humalog today. Pt is prescribed Humalog 40u At 8am and 45u at 8pm . She also takes Lantus 30u at 8am and 30u at 8pm. Pt had am blood sugar of 107; 162 at 12 noon; 167 at 5:30pm and 172 this evening. Pt is eating/drinking and has had a bm. No other changes in condition. She has a temp of 100.3. Labs are pending. RN called Dr. Glori Bickers who ordered Humalog doses can be held unless blood sugar >200. If BS > 200, the staff can given the am dose of Humalog 40u and then if it goes over 200 again- please call MD. Staff are to continue Lantus as ordered. Staff asked to call in the am and give Dr. Glori Bickers a report/update along with any lab results. Protocol(s) Used: Medication Questions - Adult Recommended Outcome per Protocol: Call Provider within 4 Hours Reason for Outcome: Recurrence of a symptom(s) or illness post prescribed medication treatment AND provider instructed patient to call if symptom(s) returned. Care Advice: ~ 07/02/2012 10:00:08PM Page 1 of 1 [email or web address]

## 2012-07-04 NOTE — Telephone Encounter (Signed)
[facility] Triage Call Report Triage Record Num: [id number] Operator: Agustina Caroli Patient Name: Andrea Wallace Call Date & Time: 07/02/2012 11:07:53PM Patient Phone: [phone] PCP: Viviana Simpler Patient Gender: Female PCP Fax : [phone] Patient DOB: May 05, 1932 Practice Name[facility] Reason for Call: Caller: Kathy/LPN; PCP: Viviana Simpler (Family Practice); CB#: [phone]; Call regarding Requesting orders; Kathy/LPN calling to clarify orders for Humalog insulin. States " order as stated is difficult to transfer to the Medication Administration Record." States order is confusing. Currently has order for Humalog Insulin 40 units in AM, Humalog 30 units at lunch and Humalog 45 units at HS. Has Lantus 30 units at 0800 and 30 units at 2000. Verbal order was given to [facility] at 2127 that Humalog doses can be held unless blood sugar > 200. This nurse called Dr. Glori Bickers to verify order- Verbal order given for Humalog 40 units in AM -Hold if blood sugar < 200. Humalog 30 units at lunch- Hold if blood sugar < 200. Humalog 45 units at [facility] Humalog if blood sugar < 200. No change in Lantus insulin. Dr. Glori Bickers states to callback if any question. Verbal order given to [facility]. No further questions. Protocol(s) Used: Medication Questions - Adult Recommended Outcome per Protocol: Call Provider within 4 Hours Reason for Outcome: Recurrence of a symptom(s) or illness post prescribed medication treatment AND provider instructed patient to call if symptom(s) returned. Care Advice: ~ 07/02/2012 11:40:40PM Page 1 of 1 CAN_[email or web address]

## 2012-07-04 NOTE — Telephone Encounter (Signed)
Okay I had planned to see her Tuesday afternoon  Thanks!

## 2012-07-04 NOTE — Telephone Encounter (Signed)
I just saw- new admit on Friday.  I just saw her.  She is doing ok- adjusted some of her meds.  Dr. Silvio Pate, please stop by and see them tomorrow. Thanks!

## 2012-07-04 NOTE — Telephone Encounter (Signed)
[facility] Triage Call Report Triage Record Num: [id number] Operator: Genevieve Norlander Patient Name: Andrea Wallace Call Date & Time: 07/01/2012 8:09:28PM Patient Phone: [phone] PCP: Viviana Simpler Patient Gender: Female PCP Fax : [phone] Patient DOB: 01-Sep-1931 Practice Name: [facility] Reason for Call: Caller: Tom/Rn; PCP: Viviana Simpler (Family Practice); CB#: [phone]; Call regarding Need orders; abdomen distended and states last bowel movement 2 weeks ago - treated with prune juice and milk of mag and also has protocol for ducolax suppository and wanted to know if okay to go ahead and administer - advise would be safe to do so and if no bowel movement by next day to call back for possible further orders; has orders for multiple narcotics (Norco, Percocet and Tramadol) and wants to know if some of these should be discontinued for safety reasons - reviewed narcotic orders and then advise to pass along in shift report what meds were given when and to leave note for staff to clarify narcotic orders am on 07/02/12 - just to use one or the other but not more than one type of narcotic at a time; right abdomen pain at moderate level onset while at hospital but not addressed there per pt/Leandria and treated with 2 percocet and a valium at 1900 on 07/01/12 (admitted to facility on 07/01/12 - had spine surgery on 06/27/12); blood sugar 245 on 07/01/12 treated with scheduled Lantus and Humalog; temp 99.8 oral on 07/01/12; blood pressure 109/67, HR 81, respiration rate 20 on 07/01/12; Guideline: Diabetes: Gastrointestinal Problems; Disposition: see provider within 24 hours for generalized bloating, distension or swelling; Appt? no. Protocol(s) Used: Diabetes: Gastrointestinal Problems Recommended Outcome per Protocol: See Provider within 24 hours Reason for Outcome: Generalized bloating, distension or swelling Care Advice: ~ SYMPTOM / CONDITION MANAGEMENT Check Blood Sugar Now: - If blood  sugar more than 250 mg/dl check for ketones before calling provider. - Tell provider results or take log of recent results to provider visit. - Follow action plan for illness. ~ Diabetes Action Plan: - Follow recommended action plan and diet - Check blood sugar as recommended - Take diabetic pills or insulin as ordered - Follow action plan for illness. ~ 07/01/2012 8:41:10PM Page 1 of 1 CAN_[email or web address]_V2

## 2012-07-12 ENCOUNTER — Telehealth: Payer: Self-pay | Admitting: Family Medicine

## 2012-07-12 NOTE — Telephone Encounter (Signed)
[facility] Triage Call Report Triage Record Num: [id number] Operator: Leota Sauers Patient Name: Andrea Wallace Call Date & Time: 07/11/2012 8:53:24PM Patient Phone: [phone] PCP: Viviana Simpler Caller Name: Demetria Pore Relationship to Patient: Unknown Patient Gender: Female PCP Fax : [phone] Patient DOB: 11/16/1931 [facility] Name[facility] Reason for Call: Caller: Hedda Slade, RN from [facility]. ; PCP: Viviana Simpler [facility]); CB#: [phone]. Caller states FSBG 147 and she is due for 30 units Lantus at bedtime. She had recent episode where her glucose dropped as low as 43 during the night. One hypoglycemic episode on 07/01/12. Contacted Dr. Gilford Rile provider on call per caller request and Diabetes: Control Problems protocol. She ordered that if patient has been eating her meals that the insulin should be given as ordered. Caller informed. Protocol(s) Used: Diabetes: Control Problems Recommended Outcome per Protocol: See Provider within 24 hours Reason for Outcome: All other situations Care Advice: ~ 07/11/2012 9:37:58PM Page 1 of 1 CAN_[email or web address]

## 2012-07-12 NOTE — Telephone Encounter (Signed)
I will review this at [facility] today

## 2012-09-21 ENCOUNTER — Ambulatory Visit: Payer: Self-pay | Admitting: Oncology

## 2012-09-22 ENCOUNTER — Ambulatory Visit: Payer: Self-pay | Admitting: Oncology

## 2012-09-22 ENCOUNTER — Ambulatory Visit: Payer: Medicare Other | Admitting: Family Medicine

## 2012-09-22 LAB — CANCER ANTIGEN 27.29: CA 27.29: 18.7 U/mL (ref 0.0–38.6)

## 2012-09-23 ENCOUNTER — Ambulatory Visit: Payer: Self-pay | Admitting: Family Medicine

## 2012-10-24 ENCOUNTER — Ambulatory Visit: Payer: Self-pay | Admitting: Oncology

## 2012-11-01 ENCOUNTER — Ambulatory Visit (INDEPENDENT_AMBULATORY_CARE_PROVIDER_SITE_OTHER): Payer: Medicare Other | Admitting: General Surgery

## 2012-11-01 ENCOUNTER — Encounter (INDEPENDENT_AMBULATORY_CARE_PROVIDER_SITE_OTHER): Payer: Self-pay | Admitting: General Surgery

## 2012-11-01 ENCOUNTER — Telehealth (INDEPENDENT_AMBULATORY_CARE_PROVIDER_SITE_OTHER): Payer: Self-pay

## 2012-11-01 VITALS — BP 118/66 | HR 61 | Temp 96.8°F | Resp 18 | Ht 64.5 in | Wt 174.2 lb

## 2012-11-01 DIAGNOSIS — C50911 Malignant neoplasm of unspecified site of right female breast: Secondary | ICD-10-CM

## 2012-11-01 DIAGNOSIS — C50919 Malignant neoplasm of unspecified site of unspecified female breast: Secondary | ICD-10-CM

## 2012-11-01 DIAGNOSIS — E042 Nontoxic multinodular goiter: Secondary | ICD-10-CM | POA: Insufficient documentation

## 2012-11-01 DIAGNOSIS — E041 Nontoxic single thyroid nodule: Secondary | ICD-10-CM

## 2012-11-01 NOTE — Patient Instructions (Signed)
Continue regular self exams  

## 2012-11-01 NOTE — Telephone Encounter (Signed)
LMOM. Patients thyroid US is set up for Friday 11/04/12 arrive @ 9:30. [facility] [place] location.

## 2012-11-01 NOTE — Progress Notes (Signed)
Subjective:     Patient ID: Andrea Wallace, female   DOB: [birth year], 79 y.o.   MRN: [id number]  HPI The patient is a 79 year old white female who is 5 years status post right mastectomy for a T2 dNmicroscopic right breast cancer. Since her last visit she had back surgery about 6 weeks ago. She has not started physical therapy yet and is still pretty so her. She denies any chest wall pain. She does note that she has had some nodules in her thyroid gland in the past and is unsure about what to do about them. She denies any compressive symptoms in her neck.  Review of Systems  Constitutional: Negative.   HENT: Negative.   Eyes: Negative.   Respiratory: Negative.   Cardiovascular: Negative.   Gastrointestinal: Negative.   Endocrine: Negative.   Genitourinary: Negative.   Musculoskeletal: Negative.   Skin: Negative.   Allergic/Immunologic: Negative.   Neurological: Negative.   Hematological: Negative.   Psychiatric/Behavioral: Negative.        Objective:   Physical Exam  Constitutional: She is oriented to person, place, and time. She appears well-developed and well-nourished.  HENT:  Head: Normocephalic and atraumatic.  Eyes: Conjunctivae and EOM are normal. Pupils are equal, round, and reactive to light.  Neck: Normal range of motion. Neck supple.  Cardiovascular: Normal rate, regular rhythm and normal heart sounds.   Pulmonary/Chest: Effort normal and breath sounds normal.  There is no palpable mass of the right chest wall. There is no palpable mass of left breast. There is no palpable axillary or supraclavicular cervical lymphadenopathy.  Abdominal: Soft. Bowel sounds are normal. She exhibits no mass. There is no tenderness.  Musculoskeletal: Normal range of motion.  Lymphadenopathy:    She has no cervical adenopathy.  Neurological: She is alert and oriented to person, place, and time.  Skin: Skin is warm and dry.  Psychiatric: She has a normal mood and affect. Her behavior is  normal.       Assessment:     The patient is 5 years status post right mastectomy for breast cancer     Plan:     At this point she will continue to do regular self exams. We will plan to see her back in one year. On reviewing her imaging on the computer it looks as though her last ultrasound of her neck was done 2 years ago. We will order a followup ultrasound of her neck to look at her thyroid gland.

## 2012-11-01 NOTE — Telephone Encounter (Signed)
Patient is aware of [place] appt 11/04/12@930  [facility]

## 2012-11-04 ENCOUNTER — Other Ambulatory Visit: Payer: Medicare Other

## 2012-11-07 ENCOUNTER — Ambulatory Visit
Admission: RE | Admit: 2012-11-07 | Discharge: 2012-11-07 | Disposition: A | Discharge status: Home / Self Care | Payer: Medicare Other | Source: Ambulatory Visit | Attending: General Surgery | Admitting: General Surgery

## 2012-11-07 DIAGNOSIS — E041 Nontoxic single thyroid nodule: Secondary | ICD-10-CM

## 2012-11-17 ENCOUNTER — Telehealth (INDEPENDENT_AMBULATORY_CARE_PROVIDER_SITE_OTHER): Payer: Self-pay

## 2012-11-17 NOTE — Telephone Encounter (Signed)
Called patient and let her know her [place] report is stable. No changes. Follow up 1 year

## 2012-11-22 ENCOUNTER — Ambulatory Visit: Payer: Self-pay | Admitting: Oncology

## 2012-12-20 ENCOUNTER — Other Ambulatory Visit: Payer: Medicare Other

## 2012-12-21 ENCOUNTER — Other Ambulatory Visit: Payer: Medicare Other

## 2012-12-21 ENCOUNTER — Ambulatory Visit: Payer: Medicare Other | Admitting: Endocrinology

## 2012-12-26 ENCOUNTER — Other Ambulatory Visit: Payer: Self-pay | Admitting: *Deleted

## 2012-12-26 MED ORDER — "INSULIN SYRINGE-NEEDLE U-100 30G X 5/16"" 0.5 ML MISC": 1.0000 | Freq: Four times a day (QID) | Status: DC

## 2012-12-27 ENCOUNTER — Ambulatory Visit: Payer: Medicare Other | Admitting: Endocrinology

## 2012-12-28 ENCOUNTER — Other Ambulatory Visit: Payer: Self-pay | Admitting: *Deleted

## 2012-12-28 DIAGNOSIS — E119 Type 2 diabetes mellitus without complications: Secondary | ICD-10-CM

## 2012-12-28 DIAGNOSIS — E785 Hyperlipidemia, unspecified: Secondary | ICD-10-CM | POA: Insufficient documentation

## 2012-12-29 ENCOUNTER — Other Ambulatory Visit: Payer: Medicare Other

## 2012-12-29 ENCOUNTER — Ambulatory Visit: Payer: Medicare Other | Admitting: Endocrinology

## 2013-01-02 ENCOUNTER — Other Ambulatory Visit: Payer: Medicare Other

## 2013-01-03 ENCOUNTER — Ambulatory Visit: Payer: Medicare Other | Admitting: Endocrinology

## 2013-01-03 ENCOUNTER — Other Ambulatory Visit: Payer: Medicare Other

## 2013-01-04 ENCOUNTER — Other Ambulatory Visit: Payer: Medicare Other

## 2013-01-04 ENCOUNTER — Ambulatory Visit: Payer: Medicare Other | Admitting: Endocrinology

## 2013-01-10 ENCOUNTER — Other Ambulatory Visit: Payer: Medicare Other

## 2013-01-11 ENCOUNTER — Other Ambulatory Visit (INDEPENDENT_AMBULATORY_CARE_PROVIDER_SITE_OTHER): Payer: Medicare Other

## 2013-01-11 ENCOUNTER — Encounter: Payer: Self-pay | Admitting: Endocrinology

## 2013-01-11 ENCOUNTER — Ambulatory Visit (INDEPENDENT_AMBULATORY_CARE_PROVIDER_SITE_OTHER): Payer: Medicare Other | Admitting: Endocrinology

## 2013-01-11 VITALS — BP 120/70 | HR 64 | Temp 98.6°F | Resp 12 | Ht 65.0 in | Wt 178.4 lb

## 2013-01-11 DIAGNOSIS — E785 Hyperlipidemia, unspecified: Secondary | ICD-10-CM

## 2013-01-11 DIAGNOSIS — E1165 Type 2 diabetes mellitus with hyperglycemia: Secondary | ICD-10-CM | POA: Insufficient documentation

## 2013-01-11 DIAGNOSIS — E042 Nontoxic multinodular goiter: Secondary | ICD-10-CM

## 2013-01-11 DIAGNOSIS — E1149 Type 2 diabetes mellitus with other diabetic neurological complication: Secondary | ICD-10-CM

## 2013-01-11 DIAGNOSIS — E119 Type 2 diabetes mellitus without complications: Secondary | ICD-10-CM

## 2013-01-11 LAB — URINALYSIS, ROUTINE W REFLEX MICROSCOPIC
Bilirubin Urine: NEGATIVE
Ketones, ur: NEGATIVE
Urine Glucose: NEGATIVE
pH: 6 (ref 5.0–8.0)

## 2013-01-11 LAB — COMPREHENSIVE METABOLIC PANEL
AST: 24 U/L (ref 0–37)
Albumin: 3.7 g/dL (ref 3.5–5.2)
Alkaline Phosphatase: 61 U/L (ref 39–117)
BUN: 38 mg/dL — ABNORMAL HIGH (ref 6–23)
Potassium: 4.5 mEq/L (ref 3.5–5.1)
Sodium: 136 mEq/L (ref 135–145)
Total Protein: 7 g/dL (ref 6.0–8.3)

## 2013-01-11 LAB — LIPID PANEL
HDL: 44.9 mg/dL (ref >39.00)
Total CHOL/HDL Ratio: 3
Triglycerides: 147 mg/dL (ref 0.0–149.0)

## 2013-01-11 LAB — MICROALBUMIN / CREATININE URINE RATIO: Creatinine,U: 85.9 mg/dL

## 2013-01-11 MED ORDER — OXYCODONE-ACETAMINOPHEN 10-325 MG PO TABS: 1.0000 | ORAL_TABLET | Freq: Four times a day (QID) | ORAL | Status: DC | PRN

## 2013-01-11 NOTE — Patient Instructions (Addendum)
Change [place] to 30 min before SUPPER only No Humalog at bedtime  Please check blood sugars at least half the time about 2 hours after any meal and as directed on waking up. Please bring blood sugar monitor to each visit  With steroid injection increase Lantus to 40 twice daily

## 2013-01-11 NOTE — Progress Notes (Signed)
Patient ID: Andrea Wallace, female   DOB: 07/05/1931, 79 y.o.   MRN: [id number]  Andrea Wallace is an 79 y.o. female.   Reason for Appointment: Diabetes follow-up   History of Present Illness   Diagnosis: Type 2 DIABETES MELITUS, long-standing   She has been on insulin for several years to control her diabetes and usually requires large doses Overall her blood sugars in the past have been relatively variable but generally higher after meals especially supper She has been told to take her Byetta before supper but for some reason she is taking only before breakfast She was changed from Victoza the Byetta because of significant postprandial hyperglycemia with usually good readings in the mornings  Again did not bring her blood sugar monitor for download and not clear what her sugars are often she is monitoring     Oral hypoglycemic drugs: Metformin ER, 500 mg daily        Side effects from medications: None, tolerating Byetta Insulin regimen: 30 Lantus twice a day, Humalog 40-30-45 with 30 units hs prn  high sugars         Proper timing of medications in relation to meals: Yes.         Monitors blood glucose: Once a day.    Glucometer: Freestyle         Blood Glucose readings from recall: readings before breakfast: 80s-110; acl same; 125; pcs/hs 200   Hypoglycemia frequency: last week at lunch: was very weak, treated with juice, also another time at lunch glucose 58         Meals: 3 meals per day.  at breakfast she is eating a peanut butter toast with egg, fruit. Usually no lunch, small portions at supper         Physical activity: exercise: Unable to do much           Dietician visit: Most recent: Years ago      Complications: are: Neuropathy    The last HbgA1c report is not available  Wt Readings from Last 3 Encounters:  01/11/13 178 lb 6.4 oz (80.922 kg)  11/01/12 174 lb 3.2 oz (79.017 kg)  06/27/12 183 lb 10.3 oz (83.3 kg)       Medication List       This list is accurate as  of: 01/11/13  9:56 AM.  Always use your most recent med list.               aspirin 81 MG tablet  Take 81 mg by mouth daily.     B-D UF III MINI PEN NEEDLES 31G X 5 MM Misc  Generic drug:  Insulin Pen Needle  daily.     CRESTOR 40 MG tablet  Generic drug:  rosuvastatin  Take 40 mg by mouth daily.     diazepam 5 MG tablet  Commonly known as:  VALIUM  Take 1-2 tablets (5-10 mg total) by mouth every 6 (six) hours as needed.     DULoxetine 60 MG capsule  Commonly known as:  CYMBALTA  60 mg.     exenatide 5 MCG/0.02ML Sopn injection  Commonly known as:  BYETTA  Inject 5 mcg into the skin daily with breakfast.     felodipine 5 MG 24 hr tablet  Commonly known as:  PLENDIL  Take 5 mg by mouth daily.     gabapentin 600 MG tablet  Commonly known as:  NEURONTIN  Take 600 mg by mouth 3 (three) times daily.  HUMALOG 100 UNIT/ML injection  Generic drug:  insulin lispro  Inject 30-45 Units into the skin 3 (three) times daily before meals. 40 in the am, 30 at lunch, 45 units at bedtime     HYDROcodone-acetaminophen 10-325 MG per tablet  Commonly known as:  NORCO  Take 1 tablet by mouth at bedtime as needed. For pain     Insulin Syringe-Needle U-100 30G X 5/16" 0.5 ML Misc  1 each by Does not apply route 4 (four) times daily.     RELION INSULIN SYR .3CC/29G 29G X 1/2" 0.3 ML Misc  Generic drug:  Insulin Syringe-Needle U-100     LANTUS 100 UNIT/ML injection  Generic drug:  insulin glargine  Inject 30 Units into the skin 2 (two) times daily.     metFORMIN 500 MG 24 hr tablet  Commonly known as:  GLUCOPHAGE-XR     montelukast 10 MG tablet  Commonly known as:  SINGULAIR  Take 10 mg by mouth at bedtime.     multivitamin with minerals Tabs tablet  Take 1 tablet by mouth daily.     omeprazole 20 MG capsule  Commonly known as:  PRILOSEC  Take 20 mg by mouth daily as needed. For acid reflux     oxyCODONE-acetaminophen 5-325 MG per tablet  Commonly known as:   PERCOCET/ROXICET  Take 1-2 tablets by mouth every 4 (four) hours as needed.     propranolol ER 120 MG 24 hr capsule  Commonly known as:  INDERAL LA  Take 120 mg by mouth daily.     traMADol 50 MG tablet  Commonly known as:  ULTRAM  50 mg.     TRICOR 145 MG tablet  Generic drug:  fenofibrate  Take 145 mg by mouth every evening.     VITAMIN B-12 PO  Take 1 tablet by mouth daily.     VITAMIN D PO  Take 1 tablet by mouth daily.        Allergies:  Allergies  Allergen Reactions  . Codeine Itching    All over the body    Past Medical History  Diagnosis Date  . Anemia   . Arthritis   . Blood transfusion   . Asthma   . Diabetes mellitus   . Hyperlipidemia   . Neuromuscular disorder   . Generalized headaches   . Nasal congestion   . Cough   . Cancer     bilateral breast  . Allergy   . Complication of anesthesia     diff  waking up  . Hypertension     dr Ouida Sills    Past Surgical History  Procedure Laterality Date  . Total knee arthroplasty    . Shoulder arthroscopy      bilateral  . Breast surgery  06/2007    right mastectomy  . Breast surgery  2011    left  . Kidney stone surgery    . Back surgery    . Foot surgery      Family History  Problem Relation Age of Onset  . Osteoporosis Mother   . Heart disease Father   . Cancer Brother   . Heart disease Sister     Social History:  reports that she quit smoking about 28 years ago. She has never used smokeless tobacco. She reports that  drinks alcohol. She reports that she does not use illicit drugs.  Review of Systems:  Currently does not take any medications for high blood pressure, not clear what she is  taking Inderal for  HYPERLIPIDEMIA: The lipid abnormality consists of elevated  triglycerides, has been treated with TriCor.  She is still complaining of low back pain despite recent surgery  She has chronic pains and paresthesia in her feet and lower legs, taking at least 2 Percocet a day for  this     Examination:   BP 120/70  Pulse 64  Temp(Src) 98.6 F (37 C)  Resp 12  Ht 5\' 5"  (1.651 m)  Wt 178 lb 6.4 oz (80.922 kg)  BMI 29.69 kg/m2  SpO2 95%  Body mass index is 29.69 kg/(m^2).   No pedal edema, diabetic foot exam done  ASSESSMENT/ PLAN::   Diabetes type 2:  The patient's diabetes control appears to be fair with reportedly good readings most of the day and rare hypoglycemia She is still requiring large amounts of insulin especially at meal times She is still reporting high sugars after supper but not at other times of the day. Difficult to know her blood sugar pattern since she has not brought her monitor for review again She is supposed to be taking her Byetta before supper also but has not been doing so. Discussed that her blood sugar should be significantly better with taking Byetta before supper Also because of an episode of low blood sugar before lunch will stop her morning Byetta Since her blood sugars before breakfast and supper are reportedly fairly good  will continue the same dose of Lantus  Discussed increasing her Lantus for hyperglycemia related to intra-articular steroids by about 10 units twice a day since her blood sugars tend to be at least 200-300 at that time Also have told her to avoid taking Humalog at bedtime since she may potentially have hypoglycemia overnight  NEUROPATHY: Prescription for Percocet given for her chronic neuropathy with pain, she is not getting any medications from her orthopedic surgeon and she usually does not take more than the prescribed doses  Multinodular goiter: Recent ultrasound done by a surgeon shows stable thyroid nodules  Counseling time over 50% of today's 25 minute visit  Adeline Petitfrere 01/11/2013, 9:56 AM   Addendum: Labs as follows: To be forwarded to PCP and nephrologist  Appointment on 01/11/2013  Component Date Value Range Status  . Hemoglobin A1C 01/11/2013 7.6* 4.6 - 6.5 % Final   Glycemic Control  Guidelines for People with Diabetes:Non Diabetic:  <6%Goal of Therapy: <7%Additional Action Suggested:  >8%   . Sodium 01/11/2013 136  135 - 145 mEq/L Final  . Potassium 01/11/2013 4.5  3.5 - 5.1 mEq/L Final  . Chloride 01/11/2013 102  96 - 112 mEq/L Final  . CO2 01/11/2013 28  19 - 32 mEq/L Final  . Glucose, Bld 01/11/2013 171* 70 - 99 mg/dL Final  . BUN 01/11/2013 38* 6 - 23 mg/dL Final  . Creatinine, Ser 01/11/2013 1.4* 0.4 - 1.2 mg/dL Final  . Total Bilirubin 01/11/2013 0.4  0.3 - 1.2 mg/dL Final  . Alkaline Phosphatase 01/11/2013 61  39 - 117 U/L Final  . AST 01/11/2013 24  0 - 37 U/L Final  . ALT 01/11/2013 21  0 - 35 U/L Final  . Total Protein 01/11/2013 7.0  6.0 - 8.3 g/dL Final  . Albumin 01/11/2013 3.7  3.5 - 5.2 g/dL Final  . Calcium 01/11/2013 8.7  8.4 - 10.5 mg/dL Final  . GFR 01/11/2013 39.87* >60.00 mL/min Final  . Microalb, Ur 01/11/2013 40.0* 0.0 - 1.9 mg/dL Final  . Creatinine,U 01/11/2013 85.9   Final  .  Microalb Creat Ratio 01/11/2013 46.6* 0.0 - 30.0 mg/g Final  . Cholesterol 01/11/2013 144  0 - 200 mg/dL Final   ATP III Classification       Desirable:  < 200 mg/dL               Borderline High:  200 - 239 mg/dL          High:  > = 240 mg/dL  . Triglycerides 01/11/2013 147.0  0.0 - 149.0 mg/dL Final   Normal:  <150 mg/dLBorderline High:  150 - 199 mg/dL  . HDL 01/11/2013 44.90  >39.00 mg/dL Final  . VLDL 01/11/2013 29.4  0.0 - 40.0 mg/dL Final  . LDL Cholesterol 01/11/2013 70  0 - 99 mg/dL Final  . Total CHOL/HDL Ratio 01/11/2013 3   Final                  Men          Women1/2 Average Risk     3.4          3.3Average Risk          5.0          4.42X Average Risk          9.6          7.13X Average Risk          15.0          11.0                      . Color, Urine 01/11/2013 LT. YELLOW  Yellow;Lt. Yellow Final  . APPearance 01/11/2013 CLEAR  Clear Final  . Specific Gravity, Urine 01/11/2013 1.020  1.000-1.030 Final  . pH 01/11/2013 6.0  5.0 - 8.0 Final  . Total  Protein, Urine 01/11/2013 30  Negative Final  . Urine Glucose 01/11/2013 NEGATIVE  Negative Final  . Ketones, ur 01/11/2013 NEGATIVE  Negative Final  . Bilirubin Urine 01/11/2013 NEGATIVE  Negative Final  . Hgb urine dipstick 01/11/2013 NEGATIVE  Negative Final  . Urobilinogen, UA 01/11/2013 0.2  0.0 - 1.0 Final  . Leukocytes, UA 01/11/2013 MODERATE  Negative Final  . Nitrite 01/11/2013 POSITIVE  Negative Final  . WBC, UA 01/11/2013 21-50/hpf  0-2/hpf Final  . RBC / HPF 01/11/2013 0-2/hpf  0-2/hpf Final  . Squamous Epithelial / LPF 01/11/2013 Rare(0-4/hpf)  Rare(0-4/hpf) Final  . Bacteria, UA 01/11/2013 Many(>50/hpf)  None Final

## 2013-01-12 ENCOUNTER — Telehealth: Payer: Self-pay | Admitting: *Deleted

## 2013-01-12 NOTE — Telephone Encounter (Signed)
Message copied by Roxanna Mew on Thu Jan 12, 2013  9:01 AM ------      Message from: Elayne Snare      Created: Wed Jan 11, 2013  9:18 PM       Kidney test high, make sure she's not taking any Advil/Aleve.      Needs to discuss with PCP or her kidney specialist ------

## 2013-01-12 NOTE — Telephone Encounter (Signed)
Pt is aware of results. 

## 2013-02-10 ENCOUNTER — Other Ambulatory Visit: Payer: Self-pay | Admitting: *Deleted

## 2013-02-13 ENCOUNTER — Other Ambulatory Visit: Payer: Medicare Other

## 2013-03-15 ENCOUNTER — Ambulatory Visit: Payer: Medicare Other | Admitting: Endocrinology

## 2013-03-15 ENCOUNTER — Other Ambulatory Visit: Payer: Medicare Other

## 2013-03-21 ENCOUNTER — Encounter: Payer: Self-pay | Admitting: Endocrinology

## 2013-03-21 ENCOUNTER — Ambulatory Visit (INDEPENDENT_AMBULATORY_CARE_PROVIDER_SITE_OTHER): Payer: Medicare Other | Admitting: Endocrinology

## 2013-03-21 ENCOUNTER — Other Ambulatory Visit: Payer: Self-pay | Admitting: *Deleted

## 2013-03-21 ENCOUNTER — Other Ambulatory Visit: Payer: Medicare Other

## 2013-03-21 ENCOUNTER — Other Ambulatory Visit: Payer: Self-pay | Admitting: Endocrinology

## 2013-03-21 VITALS — BP 110/56 | HR 67 | Temp 98.5°F | Resp 14 | Ht 65.0 in | Wt 183.7 lb

## 2013-03-21 DIAGNOSIS — E785 Hyperlipidemia, unspecified: Secondary | ICD-10-CM

## 2013-03-21 DIAGNOSIS — IMO0001 Reserved for inherently not codable concepts without codable children: Secondary | ICD-10-CM

## 2013-03-21 DIAGNOSIS — N183 Chronic kidney disease, stage 3 unspecified: Secondary | ICD-10-CM

## 2013-03-21 LAB — COMPREHENSIVE METABOLIC PANEL
ALT: 15 U/L (ref 0–35)
AST: 21 U/L (ref 0–37)
Albumin: 3.9 g/dL (ref 3.5–5.2)
BUN: 31 mg/dL — ABNORMAL HIGH (ref 6–23)
Calcium: 9 mg/dL (ref 8.4–10.5)
Chloride: 101 mEq/L (ref 96–112)
Potassium: 4.6 mEq/L (ref 3.5–5.3)
Sodium: 139 mEq/L (ref 135–145)
Total Protein: 6.1 g/dL (ref 6.0–8.3)

## 2013-03-21 MED ORDER — GLUCOSE BLOOD VI STRP: ORAL_STRIP | Status: DC

## 2013-03-21 MED ORDER — CANAGLIFLOZIN 100 MG PO TABS: 100.0000 mg | ORAL_TABLET | Freq: Every day | ORAL | Status: DC

## 2013-03-21 NOTE — Patient Instructions (Signed)
LANTUS 40 IN AM AND 30 IN PM  INVOKANA 100MG  IN AM DAILY  NOVOLOG 30 IN AM NOT 35

## 2013-03-21 NOTE — Progress Notes (Signed)
Patient ID: Andrea Wallace, female   DOB: 09-10-1931, 79 y.o.   MRN: [id number]  Andrea Wallace is an 79 y.o. female.   Reason for Appointment: Diabetes follow-up   History of Present Illness   Diagnosis: Type 2 DIABETES MELITUS, long-standing   Previous history: She has been on insulin for several years to control her diabetes and usually requires large doses Has been on basal bolus regimen for the last few years. Her blood sugars tend to fluctuate significantly but her A1c is usually at a reasonable level She does not clearly benefit from GLP-1 drugs but has been taking Byetta most recently to help with postprandial hyperglycemia  RECENT history: She has been told to take her Byetta before supper and stop it in the morning. With this her readings are not as low at lunchtime but still tend to be somewhat higher after supper In the last 10 days she has had a respiratory infection and not clear if sugars are higher from this Fasting readings have been better the last 2 days and only 59 early this morning She still tends to have readings over 300 after supper even though she thinks she is eating small portions and generally low in fat and carbohydrate     Oral hypoglycemic drugs: Metformin ER, 500 mg daily        Side effects from medications: None, tolerating Byetta Insulin regimen: 35 Lantus twice a day, Humalog 35-30-45 with  extra prn  high sugars         Proper timing of medications in relation to meals: Yes.         Monitors blood glucose: Once a day.    Glucometer: Freestyle         Blood Glucose readings from recall: readings before breakfast: 59-201, before lunch 86, 116, 5-7 PM = 163-253 and at 11 PM 136-463 with overall average 193   Hypoglycemia frequency: Just this morning     Meals: 3 meals per day.  at breakfast she is eating a peanut butter toast with egg, fruit. Usually no lunch, small portions at supper         Physical activity: exercise: Unable to do much            Dietician visit: Most recent: Years ago      Complications: are: Neuropathy     Wt Readings from Last 3 Encounters:  03/21/13 183 lb 11.2 oz (83.326 kg)  01/11/13 178 lb 6.4 oz (80.922 kg)  11/01/12 174 lb 3.2 oz (79.017 kg)   Lab Results  Component Value Date   HGBA1C 7.6* 01/11/2013   Lab Results  Component Value Date   MICROALBUR 40.0* 01/11/2013   LDLCALC 70 01/11/2013   CREATININE 1.4* 01/11/2013       Medication List       This list is accurate as of: 03/21/13  9:16 AM.  Always use your most recent med list.               aspirin 81 MG tablet  Take 81 mg by mouth daily.     B-D UF III MINI PEN NEEDLES 31G X 5 MM Misc  Generic drug:  Insulin Pen Needle  daily.     CRESTOR 40 MG tablet  Generic drug:  rosuvastatin  Take 40 mg by mouth daily.     diazepam 5 MG tablet  Commonly known as:  VALIUM  Take 1-2 tablets (5-10 mg total) by mouth every 6 (six) hours as  needed.     DULoxetine 60 MG capsule  Commonly known as:  CYMBALTA  60 mg.     exenatide 5 MCG/0.02ML Sopn injection  Commonly known as:  BYETTA  Inject 5 mcg into the skin daily with breakfast.     felodipine 5 MG 24 hr tablet  Commonly known as:  PLENDIL  Take 5 mg by mouth daily.     FLECTOR 1.3 % Ptch  Generic drug:  diclofenac     gabapentin 600 MG tablet  Commonly known as:  NEURONTIN  Take 600 mg by mouth 3 (three) times daily.     glucose blood test strip  Commonly known as:  FREESTYLE LITE  Use as instructed to check blood sugars 3 times per day, dx code 250.00     HUMALOG 100 UNIT/ML injection  Generic drug:  insulin lispro  Inject 30-45 Units into the skin 3 (three) times daily before meals. 40 in the am, 30 at lunch, 45 units at supper     HYDROcodone-acetaminophen 10-325 MG per tablet  Commonly known as:  NORCO  Take 1 tablet by mouth at bedtime as needed. For pain     Insulin Syringe-Needle U-100 30G X 5/16" 0.5 ML Misc  1 each by Does not apply route 4 (four) times  daily.     RELION INSULIN SYR .3CC/29G 29G X 1/2" 0.3 ML Misc  Generic drug:  Insulin Syringe-Needle U-100     LANTUS 100 UNIT/ML injection  Generic drug:  insulin glargine  Inject 30 Units into the skin 2 (two) times daily.     levofloxacin 250 MG tablet  Commonly known as:  LEVAQUIN     metFORMIN 500 MG 24 hr tablet  Commonly known as:  GLUCOPHAGE-XR     montelukast 10 MG tablet  Commonly known as:  SINGULAIR  Take 10 mg by mouth at bedtime.     multivitamin with minerals Tabs tablet  Take 1 tablet by mouth daily.     neomycin-polymyxin b-dexamethasone 3.5-10000-0.1 Oint  Commonly known as:  MAXITROL     omeprazole 20 MG capsule  Commonly known as:  PRILOSEC  Take 20 mg by mouth daily as needed. For acid reflux     oxyCODONE-acetaminophen 10-325 MG per tablet  Commonly known as:  PERCOCET  Take 1 tablet by mouth every 6 (six) hours as needed for pain.     propranolol ER 120 MG 24 hr capsule  Commonly known as:  INDERAL LA  Take 120 mg by mouth daily.     traMADol 50 MG tablet  Commonly known as:  ULTRAM  50 mg.     TRICOR 145 MG tablet  Generic drug:  fenofibrate  Take 145 mg by mouth every evening.     VITAMIN B-12 PO  Take 1 tablet by mouth daily.     VITAMIN D PO  Take 1 tablet by mouth daily.        Allergies:  Allergies  Allergen Reactions  . Codeine Itching    All over the body    Past Medical History  Diagnosis Date  . Anemia   . Arthritis   . Blood transfusion   . Asthma   . Diabetes mellitus   . Hyperlipidemia   . Neuromuscular disorder   . Generalized headaches   . Nasal congestion   . Cough   . Cancer     bilateral breast  . Allergy   . Complication of anesthesia     diff  waking up  . Hypertension     dr Ouida Sills    Past Surgical History  Procedure Laterality Date  . Total knee arthroplasty    . Shoulder arthroscopy      bilateral  . Breast surgery  06/2007    right mastectomy  . Breast surgery  2011    left  .  Kidney stone surgery    . Back surgery    . Foot surgery      Family History  Problem Relation Age of Onset  . Osteoporosis Mother   . Heart disease Father   . Cancer Brother   . Heart disease Sister     Social History:  reports that she quit smoking about 29 years ago. She has never used smokeless tobacco. She reports that she drinks alcohol. She reports that she does not use illicit drugs.  Review of Systems:  Currently is on felodipine for high blood pressure, not clear what she is taking Inderal for  HYPERLIPIDEMIA: The lipid abnormality consists of elevated  triglycerides, has been treated with TriCor.  She is still complaining of low back pain despite recent surgery  She has chronic pains and paresthesia in her feet and lower legs, taking a new transdermal pain medication from PCP with relief     Examination:   BP 110/56  Pulse 67  Temp(Src) 98.5 F (36.9 C)  Resp 14  Ht 5\' 5"  (1.651 m)  Wt 183 lb 11.2 oz (83.326 kg)  BMI 30.57 kg/m2  SpO2 90%  Body mass index is 30.57 kg/(m^2).   No pedal edema, diabetic foot exam done  ASSESSMENT/ PLAN::   Diabetes type 2:  The patient's diabetes control appears to be fair although probably worse recently from respiratory infection She is still requiring large amounts of insulin especially at meal times She is still having the highest blood sugars after supper but not at other times of the day.  The readings are not as low at lunchtime with stopping the morning Byetta  Fasting readings have been better the last 2 days and only 59 early this morning Since her blood sugars are higher in the evenings and lower early morning will use additional 5 units Lantus in the morning and adjust evening dose by 5 units She is currently quite concerned about her difficulty with losing weight and may be likely from her taking large doses of insulin Since her renal function has reportedly improved after treating UTI will give her a trial of  Invokana This should help her hyperglycemia, reduce insulin requirement and enable weight loss She was explained how Invokana works, benefits on various aspects of physiology and insulin requirement, possible side effects, dosage and effects on blood pressure  She will likely to reduce her mealtime insulin for now especially in the morning  Counseling time over 50% of today's 25 minute visit  Bernhard Koskinen 03/21/2013, 9:16 AM   Addendum: Labs as follows: To be forwarded to PCP and nephrologist Since BP is relatively low and creatinine higher will stop her felodipine and reduce fenofibrate to 3 times a week  Orders Only on 03/21/2013  Component Date Value Range Status  . Sodium 03/21/2013 139  135 - 145 mEq/L Final  . Potassium 03/21/2013 4.6  3.5 - 5.3 mEq/L Final  . Chloride 03/21/2013 101  96 - 112 mEq/L Final  . CO2 03/21/2013 27  19 - 32 mEq/L Final  . Glucose, Bld 03/21/2013 216* 70 - 99 mg/dL Final  . BUN 03/21/2013 31*  6 - 23 mg/dL Final  . Creat 03/21/2013 1.51* 0.50 - 1.10 mg/dL Final  . Total Bilirubin 03/21/2013 0.3  0.3 - 1.2 mg/dL Final  . Alkaline Phosphatase 03/21/2013 57  39 - 117 U/L Final  . AST 03/21/2013 21  0 - 37 U/L Final  . ALT 03/21/2013 15  0 - 35 U/L Final  . Total Protein 03/21/2013 6.1  6.0 - 8.3 g/dL Final  . Albumin 03/21/2013 3.9  3.5 - 5.2 g/dL Final  . Calcium 03/21/2013 9.0  8.4 - 10.5 mg/dL Final

## 2013-05-02 ENCOUNTER — Encounter: Payer: Self-pay | Admitting: Endocrinology

## 2013-05-02 ENCOUNTER — Ambulatory Visit (INDEPENDENT_AMBULATORY_CARE_PROVIDER_SITE_OTHER): Payer: Medicare Other | Admitting: Endocrinology

## 2013-05-02 ENCOUNTER — Other Ambulatory Visit: Payer: Self-pay | Admitting: *Deleted

## 2013-05-02 VITALS — BP 122/60 | HR 74 | Temp 98.2°F | Resp 12 | Ht 65.0 in | Wt 181.8 lb

## 2013-05-02 DIAGNOSIS — E042 Nontoxic multinodular goiter: Secondary | ICD-10-CM

## 2013-05-02 DIAGNOSIS — N183 Chronic kidney disease, stage 3 unspecified: Secondary | ICD-10-CM

## 2013-05-02 DIAGNOSIS — E1149 Type 2 diabetes mellitus with other diabetic neurological complication: Secondary | ICD-10-CM

## 2013-05-02 MED ORDER — INSULIN PEN NEEDLE 31G X 5 MM MISC: Status: DC

## 2013-05-02 MED ORDER — HYDROCODONE-ACETAMINOPHEN 10-325 MG PO TABS: 1.0000 | ORAL_TABLET | Freq: Four times a day (QID) | ORAL | Status: DC | PRN

## 2013-05-02 NOTE — Patient Instructions (Addendum)
LANTUS 35 IN AM AND 25 IN PM  IF EATING ANY CARBS AT LUNCH TAKE 8-10 HUMALOG  HUMALOG 35 AND 45 AT SUPPER  Felodipine HALF DAILY   MORE Sugars at  lunch

## 2013-05-02 NOTE — Progress Notes (Signed)
Patient ID: Andrea Wallace, female   DOB: 1932/02/19, 79 y.o.   MRN: [id number]  Reason for Appointment: Diabetes follow-up   History of Present Illness   Diagnosis: Type 2 DIABETES MELITUS, date of diagnosis 1999  Previous history: She has been on insulin for several years to control her diabetes and usually requires large doses Has been on basal bolus regimen for the last few years. Her blood sugars tend to fluctuate significantly but her A1c is usually at a reasonable level She does not clearly benefit from GLP-1 drugs but has been taking Byetta most recently to help with postprandial hyperglycemia  RECENT history: She has continued to have poor control of her diabetes with postprandial hyperglycemia Because of this on her last visit she was prescribed Invokana 100 mg daily However her creatinine had gone up to 1.5 and not clear if this has been effective She was also asked to increase her morning Lantus by 5 units and reduce the evening dose by 5 units but she has reduced both the doses by 5 units on her own Currently her FASTING blood sugars are excellent but most of her high readings are after supper She is still taking the same amount or more of HUMALOG with her breakfast and supper She is eating some carbohydrate at lunch and tends to have higher readings at suppertime without any coverage. Does not check readings at lunchtime     Oral hypoglycemic drugs: Metformin ER, 500 mg daily, Invokana 100 mg        Side effects from medications: None, tolerating Byetta Insulin regimen: 30 Lantus twice a day, Humalog 40--45 BIDwith extra prn  high sugars         Proper timing of medications in relation to meals: Yes.         Monitors blood glucose: Once a day.    Glucometer: Freestyle         Blood Glucose readings from download  PREMEAL Breakfast Lunch Dinner Bedtime Overall  Glucose range:  89-179   60   146-339   97-348    Mean/median:  125      181    POST-MEAL PC Breakfast PC Lunch PC  Dinner  Glucose range:  118-226    206-311   Mean/median:       Hypoglycemia minimal with only one low reading at 2 PM  Meals: 3 meals per day.  at breakfast she is eating a peanut butter toast with egg, fruit. Usually no lunch, small portions at supper         Physical activity: exercise: Unable to do much, hip pain           Dietician visit: Most recent: Years ago      Complications: are: Neuropathy     Wt Readings from Last 3 Encounters:  05/02/13 181 lb 12.8 oz (82.464 kg)  03/21/13 183 lb 11.2 oz (83.326 kg)  01/11/13 178 lb 6.4 oz (80.922 kg)   Lab Results  Component Value Date   HGBA1C 7.6* 01/11/2013   Lab Results  Component Value Date   MICROALBUR 40.0* 01/11/2013   LDLCALC 70 01/11/2013   CREATININE 1.51* 03/21/2013       Medication List       This list is accurate as of: 05/02/13 11:59 PM.  Always use your most recent med list.               aspirin 81 MG tablet  Take 81 mg by mouth daily.  betamethasone dipropionate 0.05 % cream  Commonly known as:  DIPROLENE     Canagliflozin 100 MG Tabs  Commonly known as:  INVOKANA  Take 1 tablet (100 mg total) by mouth daily before breakfast.     CRESTOR 40 MG tablet  Generic drug:  rosuvastatin  Take 40 mg by mouth daily.     DULoxetine 60 MG capsule  Commonly known as:  CYMBALTA  60 mg.     felodipine 5 MG 24 hr tablet  Commonly known as:  PLENDIL  Take 5 mg by mouth daily.     FLECTOR 1.3 % Ptch  Generic drug:  diclofenac     gabapentin 600 MG tablet  Commonly known as:  NEURONTIN  Take 600 mg by mouth 3 (three) times daily.     glucose blood test strip  Commonly known as:  FREESTYLE LITE  Use as instructed to check blood sugars 3 times per day, dx code 250.00     HUMALOG 100 UNIT/ML injection  Generic drug:  insulin lispro  Inject 30-45 Units into the skin 3 (three) times daily before meals. 40 in the am, 30 at lunch, 45 units at supper     HYDROcodone-acetaminophen 10-325 MG per tablet   Commonly known as:  NORCO  Take 1 tablet by mouth every 6 (six) hours as needed. For pain     Insulin Pen Needle 31G X 5 MM Misc  Commonly known as:  B-D UF III MINI PEN NEEDLES  Use 3 pen needles per day     Insulin Syringe-Needle U-100 30G X 5/16" 0.5 ML Misc  1 each by Does not apply route 4 (four) times daily.     RELION INSULIN SYR .3CC/29G 29G X 1/2" 0.3 ML Misc  Generic drug:  Insulin Syringe-Needle U-100     LANTUS 100 UNIT/ML injection  Generic drug:  insulin glargine  Inject 30 Units into the skin 2 (two) times daily.     levofloxacin 250 MG tablet  Commonly known as:  LEVAQUIN     metFORMIN 500 MG 24 hr tablet  Commonly known as:  GLUCOPHAGE-XR     montelukast 10 MG tablet  Commonly known as:  SINGULAIR  Take 10 mg by mouth at bedtime.     multivitamin with minerals Tabs tablet  Take 1 tablet by mouth daily.     neomycin-polymyxin b-dexamethasone 3.5-10000-0.1 Oint  Commonly known as:  MAXITROL     omeprazole 20 MG capsule  Commonly known as:  PRILOSEC  Take 20 mg by mouth daily as needed. For acid reflux     propranolol ER 120 MG 24 hr capsule  Commonly known as:  INDERAL LA  Take 120 mg by mouth daily.     traMADol 50 MG tablet  Commonly known as:  ULTRAM  50 mg.     TRICOR 145 MG tablet  Generic drug:  fenofibrate  Take 145 mg by mouth every evening.     VITAMIN B-12 PO  Take 1 tablet by mouth daily.     VITAMIN D PO  Take 1 tablet by mouth daily.        Allergies:  Allergies  Allergen Reactions  . Codeine Itching    All over the body    Past Medical History  Diagnosis Date  . Anemia   . Arthritis   . Blood transfusion   . Asthma   . Diabetes mellitus   . Hyperlipidemia   . Neuromuscular disorder   . Generalized headaches   .  Nasal congestion   . Cough   . Cancer     bilateral breast  . Allergy   . Complication of anesthesia     diff  waking up  . Hypertension     dr Ouida Sills    Past Surgical History  Procedure  Laterality Date  . Total knee arthroplasty    . Shoulder arthroscopy      bilateral  . Breast surgery  06/2007    right mastectomy  . Breast surgery  2011    left  . Kidney stone surgery    . Back surgery    . Foot surgery      Family History  Problem Relation Age of Onset  . Osteoporosis Mother   . Heart disease Father   . Cancer Brother   . Heart disease Sister     Social History:  reports that she quit smoking about 29 years ago. She has never used smokeless tobacco. She reports that she drinks alcohol. She reports that she does not use illicit drugs.  Review of Systems:  She is still on felodipine 5 mg for high blood pressure, not clear what she is taking Inderal for. She was told to stop the felodipine because of low normal blood pressure and high creatinine but was told by PCP to continue this. Home systolic [id number] and [id number]  HYPERLIPIDEMIA: The lipid abnormality consists of elevated  triglycerides, has been treated with TriCor.  She is still complaining of "hip" pain despite recent surgery  She has chronic pains and paresthesia in her feet and lower legs, asking again for a prescription for hydrocodone. Also on gabapentin 600 mg Last diabetic foot exam was in 02/2013     Examination:   BP 122/60  Pulse 74  Temp(Src) 98.2 F (36.8 C)  Resp 12  Ht 5\' 5"  (1.651 m)  Wt 181 lb 12.8 oz (82.464 kg)  BMI 30.25 kg/m2  SpO2 90%  Body mass index is 30.25 kg/(m^2).   No pedal edema  ASSESSMENT/ PLAN::   Diabetes type 2:  The patient's diabetes control appears to be fair with good readings in the mornings and mostly high readings in the evenings before and after supper She is not sure which readings are after meals and does have some variability Not clear if she is benefiting from [place] except that her fasting readings are more consistent and overall average is slightly better She is still requiring large amounts of HUMALOG insulinand no reduction in dosage  with Invokana She is still having the highest blood sugars after supper but not at other times of the day.  Her weight has not improved  CKD/nephropathy:  She will reduce her felodipine to half tablet because of low normal blood pressure. Does not have proteinuria and for her age blood pressure is too tightly controlled.  NEUROPATHY: She is having chronic pains and also other musculoskeletal problems. Hydrocodone 10 mg prescription, 100 tablets given. Would prefer that this be prescribed by her PCP  Discussed the following with the patient:  Increase Lantus to 35 units in the morning and reduced to 25 in evening for better daytime blood sugar control and avoiding overnight hypoglycemia  Start taking 8-10 units insulin at lunchtime unless she is not eating any carbohydrate  More readings after breakfast and lunch  Continue Invokana, will check her renal function again today  Continue same dose of Humalog at Supper but reduce it by 5 units in the morning   Counseling time over 50%  of today's 25 minute visit  Tamu Golz 05/03/2013, 1:13 PM   Addendum: Patient left without getting her lab drawn. She will get them done at PCP office

## 2013-05-03 ENCOUNTER — Telehealth: Payer: Self-pay | Admitting: Endocrinology

## 2013-05-03 ENCOUNTER — Other Ambulatory Visit: Payer: Self-pay | Admitting: *Deleted

## 2013-05-03 MED ORDER — INSULIN GLARGINE 100 UNIT/ML ~~LOC~~ SOLN: 30.0000 [IU] | Freq: Two times a day (BID) | SUBCUTANEOUS | Status: DC

## 2013-05-03 MED ORDER — INSULIN LISPRO 100 UNIT/ML ~~LOC~~ SOLN: 30.0000 [IU] | Freq: Three times a day (TID) | SUBCUTANEOUS | Status: DC

## 2013-05-03 NOTE — Telephone Encounter (Signed)
Pt states she need her insulin refilled  Call [phone]  Thank you :)

## 2013-06-12 ENCOUNTER — Other Ambulatory Visit: Payer: Self-pay | Admitting: *Deleted

## 2013-06-12 MED ORDER — GABAPENTIN 600 MG PO TABS: 600.0000 mg | ORAL_TABLET | Freq: Three times a day (TID) | ORAL | Status: DC

## 2013-07-04 ENCOUNTER — Encounter: Payer: Self-pay | Admitting: Endocrinology

## 2013-07-04 ENCOUNTER — Other Ambulatory Visit: Payer: Self-pay | Admitting: Endocrinology

## 2013-07-04 ENCOUNTER — Ambulatory Visit (INDEPENDENT_AMBULATORY_CARE_PROVIDER_SITE_OTHER): Payer: Medicare Other | Admitting: Endocrinology

## 2013-07-04 VITALS — BP 98/42 | HR 86 | Temp 97.9°F | Resp 14 | Ht 65.0 in | Wt 176.8 lb

## 2013-07-04 DIAGNOSIS — N183 Chronic kidney disease, stage 3 unspecified: Secondary | ICD-10-CM

## 2013-07-04 DIAGNOSIS — E1149 Type 2 diabetes mellitus with other diabetic neurological complication: Secondary | ICD-10-CM

## 2013-07-04 DIAGNOSIS — I959 Hypotension, unspecified: Secondary | ICD-10-CM

## 2013-07-04 DIAGNOSIS — E785 Hyperlipidemia, unspecified: Secondary | ICD-10-CM

## 2013-07-04 LAB — BASIC METABOLIC PANEL
BUN: 36 mg/dL — ABNORMAL HIGH (ref 6–23)
CHLORIDE: 105 meq/L (ref 96–112)
CO2: 27 mEq/L (ref 19–32)
Calcium: 9.5 mg/dL (ref 8.4–10.5)
Creat: 1.37 mg/dL — ABNORMAL HIGH (ref 0.50–1.10)
Glucose, Bld: 115 mg/dL — ABNORMAL HIGH (ref 70–99)
POTASSIUM: 5.3 meq/L (ref 3.5–5.3)
SODIUM: 140 meq/L (ref 135–145)

## 2013-07-04 LAB — HEMOGLOBIN A1C
Hgb A1c MFr Bld: 7.3 % — ABNORMAL HIGH (ref <5.7)
Mean Plasma Glucose: 163 mg/dL — ABNORMAL HIGH (ref <117)

## 2013-07-04 MED ORDER — HYDROCODONE-ACETAMINOPHEN 10-325 MG PO TABS: 1.0000 | ORAL_TABLET | Freq: Four times a day (QID) | ORAL | Status: DC | PRN

## 2013-07-04 NOTE — Progress Notes (Signed)
Patient ID: Andrea Wallace, female   DOB: [birth year], 80 y.o.   MRN: [id number]   Reason for Appointment: Diabetes follow-up   History of Present Illness   Diagnosis: Type 2 DIABETES MELITUS, date of diagnosis 1999  Previous history: She has been on insulin for several years to control her diabetes and usually requires large doses Has been on basal bolus regimen for the last few years, taking Lantus twice a day. Her blood sugars tend to fluctuate significantly but her A1c is usually at a reasonable level She does not clearly benefit from GLP-1 drugs but has been taking Byetta most recently to help with postprandial hyperglycemia  RECENT history: She is still having high readings in the evenings before supper and did not increase her morning Lantus as directed. She is still arbitrarily taking large doses of mealtime coverage even though some of her readings after supper are low normal More recently she has been on Nutrisystem's diet and has lost significant amount of weight. She probably is eating less carbohydrate with his diet but her sugars are better only after supper sometimes or in the morning Overall highest blood sugars are after supper She has significant fluctuation of her blood sugars at all times especially in the evenings based on her food intake.; May not always eat consistent amount at lunch She had been Given Invokana previously and not clear this is helping. She has not taken this recently Taking only low dose metformin because of renal dysfunction     Oral hypoglycemic drugs: Metformin ER, 500 mg daily      Side effects from medications: None, tolerating Byetta Insulin regimen: 30 Lantus twice a day, Humalog 35-40 am; 0-20 acl 45-50 acs       Proper timing of medications in relation to meals: Yes.         Monitors blood glucose: Once a day.    Glucometer: Freestyle         Blood Glucose readings from download  PREMEAL Breakfast Lunch Dinner Bedtime Overall  Glucose range:   82-206   49-271   71-265   230-316    Mean/median:  139    191   250   183    POST-MEAL PC Breakfast PC Lunch PC Dinner  Glucose range:    101-341   Mean/median:    206     Hypoglycemia sporadically occurring around lunchtime  Meals: 3 meals per day.  Nutrisystem for 2 weeks; at breakfast she is eating a muffin with egg. Protein bar at lunch, small portions at supper         Physical activity: exercise: some bike           Dietician visit: Most recent: Years ago      Complications: are: Neuropathy     Wt Readings from Last 3 Encounters:  07/04/13 176 lb 12.8 oz (80.196 kg)  05/02/13 181 lb 12.8 oz (82.464 kg)  03/21/13 183 lb 11.2 oz (83.326 kg)   A1c on 05/23/13 was 7.9% from outside lab  Lab Results  Component Value Date   HGBA1C 7.6* 01/11/2013   Lab Results  Component Value Date   MICROALBUR 40.0* 01/11/2013   LDLCALC 70 01/11/2013   CREATININE 1.51* 03/21/2013       Medication List       This list is accurate as of: 07/04/13 11:34 AM.  Always use your most recent med list.  aspirin 81 MG tablet  Take 81 mg by mouth daily.     betamethasone dipropionate 0.05 % cream  Commonly known as:  DIPROLENE     Canagliflozin 100 MG Tabs  Commonly known as:  INVOKANA  Take 1 tablet (100 mg total) by mouth daily before breakfast.     CRESTOR 40 MG tablet  Generic drug:  rosuvastatin  Take 40 mg by mouth daily.     DULoxetine 60 MG capsule  Commonly known as:  CYMBALTA  60 mg.     felodipine 5 MG 24 hr tablet  Commonly known as:  PLENDIL  Take 5 mg by mouth daily.     FLECTOR 1.3 % Ptch  Generic drug:  diclofenac     gabapentin 600 MG tablet  Commonly known as:  NEURONTIN  Take 1 tablet (600 mg total) by mouth 3 (three) times daily.     glucose blood test strip  Commonly known as:  FREESTYLE LITE  Use as instructed to check blood sugars 3 times per day, dx code 250.00     HYDROcodone-acetaminophen 10-325 MG per tablet  Commonly known as:   NORCO  Take 1 tablet by mouth every 6 (six) hours as needed. For pain     insulin glargine 100 UNIT/ML injection  Commonly known as:  LANTUS  Inject 0.3 mLs (30 Units total) into the skin 2 (two) times daily.     insulin lispro 100 UNIT/ML injection  Commonly known as:  HUMALOG  Inject 30-45 Units into the skin 3 (three) times daily before meals. 40 in the am, 30 at lunch, 40-50 units at supper     Insulin Pen Needle 31G X 5 MM Misc  Commonly known as:  B-D UF III MINI PEN NEEDLES  Use 3 pen needles per day     Insulin Syringe-Needle U-100 30G X 5/16" 0.5 ML Misc  1 each by Does not apply route 4 (four) times daily.     RELION INSULIN SYR .3CC/29G 29G X 1/2" 0.3 ML Misc  Generic drug:  Insulin Syringe-Needle U-100     levofloxacin 250 MG tablet  Commonly known as:  LEVAQUIN     metFORMIN 500 MG 24 hr tablet  Commonly known as:  GLUCOPHAGE-XR     montelukast 10 MG tablet  Commonly known as:  SINGULAIR  Take 10 mg by mouth at bedtime.     multivitamin with minerals Tabs tablet  Take 1 tablet by mouth daily.     neomycin-polymyxin b-dexamethasone 3.5-10000-0.1 Oint  Commonly known as:  MAXITROL     omeprazole 20 MG capsule  Commonly known as:  PRILOSEC  Take 20 mg by mouth daily as needed. For acid reflux     propranolol ER 120 MG 24 hr capsule  Commonly known as:  INDERAL LA  Take 120 mg by mouth daily.     traMADol 50 MG tablet  Commonly known as:  ULTRAM  50 mg.     TRICOR 145 MG tablet  Generic drug:  fenofibrate  Take 145 mg by mouth every evening.     VITAMIN B-12 PO  Take 1 tablet by mouth daily.     VITAMIN D PO  Take 1 tablet by mouth daily.        Allergies:  Allergies  Allergen Reactions  . Codeine Itching    All over the body    Past Medical History  Diagnosis Date  . Anemia   . Arthritis   . Blood transfusion   .  Asthma   . Diabetes mellitus   . Hyperlipidemia   . Neuromuscular disorder   . Generalized headaches   . Nasal  congestion   . Cough   . Cancer     bilateral breast  . Allergy   . Complication of anesthesia     diff  waking up  . Hypertension     dr Ouida Sills    Past Surgical History  Procedure Laterality Date  . Total knee arthroplasty    . Shoulder arthroscopy      bilateral  . Breast surgery  06/2007    right mastectomy  . Breast surgery  2011    left  . Kidney stone surgery    . Back surgery    . Foot surgery      Family History  Problem Relation Age of Onset  . Osteoporosis Mother   . Heart disease Father   . Cancer Brother   . Heart disease Sister     Social History:  reports that she quit smoking about 29 years ago. She has never used smokeless tobacco. She reports that she drinks alcohol. She reports that she does not use illicit drugs.  Review of Systems:  She is still on felodipine 5 mg for high blood pressure, not clear what she is taking Inderal for. She was told to stop the felodipine because of low normal blood pressure and high creatinine but was told by PCP to continue this. Home systolic [id number] and [id number]  Chronic kidney disease: Last creatinine was 1.4 done in 04/2013  HYPERLIPIDEMIA: The lipid abnormality consists of elevated  triglycerides, has been treated with TriCor. Recent LDL 58 with triglycerides 186 and HDL 39 done on 05/23/13  She is still complaining of back  pain despite surgery  She has chronic pains and paresthesia in her feet and lower legs, asking for a refill on the prescription for hydrocodone. Also on gabapentin 600 mg Last diabetic foot exam was in 02/2013      Examination:   BP 98/42  Pulse 86  Temp(Src) 97.9 F (36.6 C)  Resp 14  Ht 5\' 5"  (1.651 m)  Wt 176 lb 12.8 oz (80.196 kg)  BMI 29.42 kg/m2  SpO2 94%  Body mass index is 29.42 kg/(m^2).   No pedal edema  ASSESSMENT/ PLAN::   Diabetes type 2:  The patient's diabetes control appears to be still inadequate and with marked increase in blood sugars late in the  evenings This is despite her losing weight and going on a Nutrisystem diet Blood sugars are relatively lower around lunchtime and recently in the mornings also See history of present illness for detailed discussion of current management  CKD/nephropathy:  She will stop felodipine because of low normal blood pressure. Does not have proteinuria and especially for her age her blood pressure is too low  NEUROPATHY: She is having chronic pains and also other musculoskeletal problems. Hydrocodone 10 mg prescription, 100 tablets given. Would prefer that this be prescribed by her PCP orthopedic surgeon  Recommended the following today:  Increase Lantus to 35 units in the morning and reduce it to 28 in evening for better evening blood sugar control   Start taking 8-10 units insulin at lunchtime unless she is not eating any carbohydrate  More readings after breakfast and lunch  May leave off Invokana, will consider increasing metformin if creatinine that are on the next time  Reduce dose of Humalog by 5 units in the morning and suppertime especially with recent  change in diet and tendency to lower readings at lunchtime  Prescription for hydrocodone 10 mg given  Continue TriCor   Counseling time over 50% of today's 25 minute visit  Johnanthony Wilden 07/04/2013, 11:34 AM

## 2013-07-04 NOTE — Patient Instructions (Signed)
Lantus 35 in am and 28 in pm; reduce pm dose to 25 if am sugar < 90  Stop felopine

## 2013-08-30 ENCOUNTER — Other Ambulatory Visit: Payer: Self-pay | Admitting: *Deleted

## 2013-08-30 MED ORDER — FENOFIBRATE 145 MG PO TABS: 145.0000 mg | ORAL_TABLET | Freq: Every evening | ORAL | Status: DC

## 2013-09-01 ENCOUNTER — Ambulatory Visit (INDEPENDENT_AMBULATORY_CARE_PROVIDER_SITE_OTHER): Payer: Medicare Other | Admitting: Endocrinology

## 2013-09-01 ENCOUNTER — Encounter: Payer: Self-pay | Admitting: Endocrinology

## 2013-09-01 VITALS — BP 136/62 | HR 67 | Temp 98.1°F | Resp 16 | Ht 65.0 in | Wt 169.8 lb

## 2013-09-01 DIAGNOSIS — M48062 Spinal stenosis, lumbar region with neurogenic claudication: Secondary | ICD-10-CM

## 2013-09-01 DIAGNOSIS — E1149 Type 2 diabetes mellitus with other diabetic neurological complication: Secondary | ICD-10-CM

## 2013-09-01 DIAGNOSIS — N183 Chronic kidney disease, stage 3 unspecified: Secondary | ICD-10-CM

## 2013-09-01 DIAGNOSIS — E782 Mixed hyperlipidemia: Secondary | ICD-10-CM

## 2013-09-01 MED ORDER — HYDROCODONE-ACETAMINOPHEN 10-325 MG PO TABS: 1.0000 | ORAL_TABLET | Freq: Four times a day (QID) | ORAL | Status: DC | PRN

## 2013-09-01 NOTE — Patient Instructions (Addendum)
Lantus twice a day: 35 in am and 30 in pm,   Humalog 30-35 am; LUNCH 15 units, suppertime 35-45  Check blood sugar at least every other day before or 2 hours after lunch time

## 2013-09-01 NOTE — Progress Notes (Signed)
Patient ID: Andrea Wallace, female   DOB: 1932/01/20, 80 y.o.   MRN: [id number]   Reason for Appointment: Diabetes follow-up   History of Present Illness   Diagnosis: Type 2 DIABETES MELITUS, date of diagnosis 1999  Previous history: She has been on insulin for several years to control her diabetes and usually requires large doses Has been on basal bolus regimen for the last few years, taking Lantus twice a day.  Her blood sugars tend to fluctuate significantly but her A1c is usually at a reasonable level She does not clearly benefit from GLP-1 drugs and had been taking Byetta to help with postprandial hyperglycemia, this has been stopped  RECENT history: On her last visit she was instructed to Increase Lantus to 35 units in the morning and reduce the dose to 28 in evening for better afternoon blood sugar control. However she did not make this change and her blood sugars are averaging over 200 before supper  She was also supposed to start taking 8-10 units insulin at lunchtime unless she is not eating any carbohydrate, has not done this Current blood sugar patterns show fairly good readings in the mornings overall with occasional high readings Her blood sugars are fluctuating before supper time and more so after supper She has had only sporadic readings midday with one low reading and one reading of 200 Difficult to analyze her pattern since she has an incorrect time on her monitor She had been Given Invokana previously and this was stopped because of unclear benefit Taking only low dose metformin because of renal dysfunction She has gone back to the [employer] and has lost some more weight but does not appear to be needing less insulin, she does not know how to adjust the dose based on carbohydrate intake. She is also getting a meal at lunch time     Oral hypoglycemic drugs: Metformin ER, 500 mg daily      Side effects from medications: None  Insulin regimen: 30 Lantus twice a day,  Humalog 35 am; 0-20 acl 40-45 acs       Proper timing of medications in relation to meals: Yes.         Monitors blood glucose: Once a day.    Glucometer: Freestyle         Blood Glucose readings recently from download  PREMEAL Breakfast Lunch Dinner Bedtime Overall  Glucose range:  83-213  ?   111-276   124-401  35-401  Mean/median:  141    217   231    194    Hypoglycemia sporadically occurring around lunchtime with glucose of 38 on 3/28  Meals: 3 meals per day.  Nutrisystem for 4 weeks; at breakfast she is eating a muffin with egg. Protein bar at lunch, small portions at supper         Physical activity: exercise: some bike           Dietician visit: Most recent: Years ago      Complications: are: Neuropathy     Wt Readings from Last 3 Encounters:  09/01/13 169 lb 12.8 oz (77.021 kg)  07/04/13 176 lb 12.8 oz (80.196 kg)  05/02/13 181 lb 12.8 oz (82.464 kg)   A1c on 05/23/13 was 7.9% from outside lab  Lab Results  Component Value Date   HGBA1C 7.3* 07/04/2013   HGBA1C 7.6* 01/11/2013   Lab Results  Component Value Date   MICROALBUR 40.0* 01/11/2013   LDLCALC 70 01/11/2013   CREATININE  1.37* 07/04/2013       Medication List       This list is accurate as of: 09/01/13 10:14 AM.  Always use your most recent med list.               aspirin 81 MG tablet  Take 81 mg by mouth daily.     betamethasone dipropionate 0.05 % cream  Commonly known as:  DIPROLENE     Canagliflozin 100 MG Tabs  Commonly known as:  INVOKANA  Take 1 tablet (100 mg total) by mouth daily before breakfast.     CRESTOR 40 MG tablet  Generic drug:  rosuvastatin  Take 40 mg by mouth daily.     DULoxetine 60 MG capsule  Commonly known as:  CYMBALTA  60 mg.     felodipine 5 MG 24 hr tablet  Commonly known as:  PLENDIL  Take 5 mg by mouth daily.     fenofibrate 145 MG tablet  Commonly known as:  TRICOR  Take 1 tablet (145 mg total) by mouth every evening.     FLECTOR 1.3 % Ptch  Generic  drug:  diclofenac     gabapentin 600 MG tablet  Commonly known as:  NEURONTIN  Take 1 tablet (600 mg total) by mouth 3 (three) times daily.     glucose blood test strip  Commonly known as:  FREESTYLE LITE  Use as instructed to check blood sugars 3 times per day, dx code 250.00     HYDROcodone-acetaminophen 10-325 MG per tablet  Commonly known as:  NORCO  Take 1 tablet by mouth every 6 (six) hours as needed. For pain     insulin glargine 100 UNIT/ML injection  Commonly known as:  LANTUS  Inject 0.3 mLs (30 Units total) into the skin 2 (two) times daily.     insulin lispro 100 UNIT/ML injection  Commonly known as:  HUMALOG  Inject 30-45 Units into the skin 3 (three) times daily before meals. 40 in the am, 30 at lunch, 40-50 units at supper     Insulin Pen Needle 31G X 5 MM Misc  Commonly known as:  B-D UF III MINI PEN NEEDLES  Use 3 pen needles per day     Insulin Syringe-Needle U-100 30G X 5/16" 0.5 ML Misc  1 each by Does not apply route 4 (four) times daily.     RELION INSULIN SYR .3CC/29G 29G X 1/2" 0.3 ML Misc  Generic drug:  Insulin Syringe-Needle U-100     levofloxacin 250 MG tablet  Commonly known as:  LEVAQUIN     metFORMIN 500 MG 24 hr tablet  Commonly known as:  GLUCOPHAGE-XR     montelukast 10 MG tablet  Commonly known as:  SINGULAIR  Take 10 mg by mouth at bedtime.     multivitamin with minerals Tabs tablet  Take 1 tablet by mouth daily.     neomycin-polymyxin b-dexamethasone 3.5-10000-0.1 Oint  Commonly known as:  MAXITROL     omeprazole 20 MG capsule  Commonly known as:  PRILOSEC  Take 20 mg by mouth daily as needed. For acid reflux     propranolol ER 120 MG 24 hr capsule  Commonly known as:  INDERAL LA  Take 120 mg by mouth daily.     traMADol 50 MG tablet  Commonly known as:  ULTRAM  50 mg.     VITAMIN B-12 PO  Take 1 tablet by mouth daily.     VITAMIN D PO  Take 1 tablet by mouth daily.        Allergies:  No Known Allergies  Past  Medical History  Diagnosis Date  . Anemia   . Arthritis   . Blood transfusion   . Asthma   . Diabetes mellitus   . Hyperlipidemia   . Neuromuscular disorder   . Generalized headaches   . Nasal congestion   . Cough   . Cancer     bilateral breast  . Allergy   . Complication of anesthesia     diff  waking up  . Hypertension     dr Ouida Sills    Past Surgical History  Procedure Laterality Date  . Total knee arthroplasty    . Shoulder arthroscopy      bilateral  . Breast surgery  06/2007    right mastectomy  . Breast surgery  2011    left  . Kidney stone surgery    . Back surgery    . Foot surgery      Family History  Problem Relation Age of Onset  . Osteoporosis Mother   . Heart disease Father   . Cancer Brother   . Heart disease Sister     Social History:  reports that she quit smoking about 29 years ago. She has never used smokeless tobacco. She reports that she drinks alcohol. She reports that she does not use illicit drugs.  Review of Systems:  Previously on felodipine 5 mg for high blood pressure, not clear what she is taking Inderal for. She was told to stop the felodipine because of low normal blood pressure and high creatinine but was told by PCP to continue this. Home systolic [id number] and [id number]  Chronic kidney disease: Creatinine appears to be upper normal, also followed periodically by nephrologist  HYPERLIPIDEMIA: The lipid abnormality consists of elevated  triglycerides, has been treated with TriCor. Recent LDL 58 with triglycerides 186 and HDL 39 done on 05/23/13  She is still complaining of back pain despite surgery  She has chronic pains and paresthesia in her feet and lower legs, bid Rx  asking for a refill on the prescription for hydrocodone. Also on gabapentin 600 mg Last diabetic foot exam was in 02/2013      Examination:   BP 136/62  Pulse 67  Temp(Src) 98.1 F (36.7 C)  Resp 16  Ht 5\' 5"  (1.651 m)  Wt 169 lb 12.8 oz (77.021  kg)  BMI 28.26 kg/m2  SpO2 95%  Body mass index is 28.26 kg/(m^2).   No pedal edema  ASSESSMENT/ PLAN:   Diabetes type 2:  The patient's diabetes control appears to be still inadequate and with marked increase in blood sugars late in the evenings This is despite her losing weight and going on a Nutrisystem diet Blood sugars are relatively lower around lunchtime and recently in the mornings also See history of present illness for detailed discussion of current management  CKD/nephropathy:  She will not take felodipine because of tendency to low  blood pressure which may also cause renal dysfunction.  Does not have proteinuria and especially for her age her blood pressure targets are less tight She will have renal function checked from PCP and have records faxed over  NEUROPATHY: She continues to have chronic pains and also back pain taking chronic narcotic yes analgesics.  Hydrocodone 10 mg prescription, 100 tablets given and she continues to get this from our office, apparently not getting it from other physicians  Recommended the following  changes today:    Lantus twice a day: 35 in am and 30 in pm,   Humalog 30-35 am; LUNCH 15   SUPPER 35-45  Prescription for hydrocodone 10 mg, 100 tablets given  More readings after breakfast and lunch  Adjust Humalog at breakfast and lunch based on readings after meals, target 150-180  Call if blood sugars are still high before supper   Counseling time over 50% of today's 25 minute visit  Elayne Snare 09/01/2013, 10:14 AM

## 2013-09-25 ENCOUNTER — Ambulatory Visit: Payer: Self-pay | Admitting: Oncology

## 2013-09-29 ENCOUNTER — Ambulatory Visit: Payer: Self-pay | Admitting: Oncology

## 2013-10-03 LAB — CANCER ANTIGEN 27.29: CA 27.29: 32.2 U/mL (ref 0.0–38.6)

## 2013-10-23 ENCOUNTER — Ambulatory Visit: Payer: Self-pay | Admitting: Oncology

## 2013-10-30 DIAGNOSIS — M549 Dorsalgia, unspecified: Secondary | ICD-10-CM

## 2013-10-30 DIAGNOSIS — G8929 Other chronic pain: Secondary | ICD-10-CM | POA: Insufficient documentation

## 2013-12-01 ENCOUNTER — Other Ambulatory Visit: Payer: Medicare Other

## 2013-12-05 ENCOUNTER — Other Ambulatory Visit (INDEPENDENT_AMBULATORY_CARE_PROVIDER_SITE_OTHER): Payer: Medicare Other

## 2013-12-05 ENCOUNTER — Encounter: Payer: Self-pay | Admitting: Endocrinology

## 2013-12-05 ENCOUNTER — Ambulatory Visit (INDEPENDENT_AMBULATORY_CARE_PROVIDER_SITE_OTHER): Payer: Medicare Other | Admitting: Endocrinology

## 2013-12-05 VITALS — BP 104/47 | HR 74 | Temp 97.7°F | Resp 14 | Ht 65.0 in | Wt 176.6 lb

## 2013-12-05 DIAGNOSIS — E042 Nontoxic multinodular goiter: Secondary | ICD-10-CM

## 2013-12-05 DIAGNOSIS — N183 Chronic kidney disease, stage 3 unspecified: Secondary | ICD-10-CM

## 2013-12-05 DIAGNOSIS — E782 Mixed hyperlipidemia: Secondary | ICD-10-CM

## 2013-12-05 DIAGNOSIS — E1142 Type 2 diabetes mellitus with diabetic polyneuropathy: Secondary | ICD-10-CM

## 2013-12-05 DIAGNOSIS — E785 Hyperlipidemia, unspecified: Secondary | ICD-10-CM

## 2013-12-05 DIAGNOSIS — E1149 Type 2 diabetes mellitus with other diabetic neurological complication: Secondary | ICD-10-CM

## 2013-12-05 LAB — TSH: TSH: 1.55 u[IU]/mL (ref 0.35–4.50)

## 2013-12-05 LAB — LIPID PANEL
CHOL/HDL RATIO: 3
Cholesterol: 142 mg/dL (ref 0–200)
HDL: 44.1 mg/dL (ref >39.00)
LDL CALC: 32 mg/dL (ref 0–99)
NonHDL: 97.9
Triglycerides: 328 mg/dL — ABNORMAL HIGH (ref 0.0–149.0)
VLDL: 65.6 mg/dL — ABNORMAL HIGH (ref 0.0–40.0)

## 2013-12-05 LAB — COMPREHENSIVE METABOLIC PANEL
ALK PHOS: 53 U/L (ref 39–117)
ALT: 19 U/L (ref 0–35)
AST: 27 U/L (ref 0–37)
Albumin: 4 g/dL (ref 3.5–5.2)
BUN: 27 mg/dL — AB (ref 6–23)
CALCIUM: 10 mg/dL (ref 8.4–10.5)
CO2: 23 mEq/L (ref 19–32)
Chloride: 103 mEq/L (ref 96–112)
Creatinine, Ser: 1.4 mg/dL — ABNORMAL HIGH (ref 0.4–1.2)
GFR: 38.15 mL/min — ABNORMAL LOW (ref >60.00)
POTASSIUM: 4.3 meq/L (ref 3.5–5.1)
SODIUM: 141 meq/L (ref 135–145)
TOTAL PROTEIN: 7.3 g/dL (ref 6.0–8.3)
Total Bilirubin: 0.4 mg/dL (ref 0.2–1.2)

## 2013-12-05 LAB — T4, FREE: Free T4: 1.11 ng/dL (ref 0.60–1.60)

## 2013-12-05 LAB — HEMOGLOBIN A1C: Hgb A1c MFr Bld: 7.8 % — ABNORMAL HIGH (ref 4.6–6.5)

## 2013-12-05 MED ORDER — HYDROCODONE-ACETAMINOPHEN 10-325 MG PO TABS: 1.0000 | ORAL_TABLET | Freq: Four times a day (QID) | ORAL | Status: DC | PRN

## 2013-12-05 MED ORDER — INSULIN GLARGINE 300 UNIT/ML ~~LOC~~ SOPN: 60.0000 [IU] | PEN_INJECTOR | Freq: Every day | SUBCUTANEOUS | Status: DC

## 2013-12-05 NOTE — Patient Instructions (Signed)
Do not take over 20 Humalog at lunch and 40 at supper  Switch lantus to Toujeo 60 units in ams only; may go to 65 if am sugar stays over 150  More sugars after supper;   May trake Humolog just after meal if not sure of intake

## 2013-12-05 NOTE — Progress Notes (Signed)
Patient ID: Andrea Wallace, female   DOB: 14-Mar-1932, 80 y.o.   MRN: [id number]   Reason for Appointment: Diabetes follow-up   History of Present Illness   Diagnosis: Type 2 DIABETES MELITUS, date of diagnosis 1999  Previous history: She has been on insulin for several years to control her diabetes and usually requires large doses Has been on basal bolus regimen for the last few years, taking Lantus twice a day.  Her blood sugars tend to fluctuate significantly but her A1c is usually at a reasonable level She does not clearly benefit from GLP-1 drugs and had been taking Byetta to help with postprandial hyperglycemia, this has been stopped  RECENT history: Insulin regimen: 30 Lantus twice a day, Humalog 35 am; 0-20 acl 40-45 acs        She tends to adjust her insulin apparently on her own and does not follow instructions given on her visits On her last 2 visits she was instructed to Increase Lantus to 35 units in the morning and reduce the dose to 30 in evening for better before supper blood sugar control. However she again did not make this change and her blood sugars are mostly high before supper She was also supposed to start taking 8-10 units insulin at lunchtime unless she is not eating any carbohydrate but is taking as much as 30 units and sometimes will get hypoglycemia Also she may sometimes take as much as 45 units to cover her evening meal even when she does not eat as much but is going by what her blood sugars before eating Current blood sugar patterns   Periodic low blood sugars on waking up last month and only sporadically recently, most readings in the mornings are fairly good and averaging about 105  Blood sugars are mostly higher at lunchtime but occasionally low last month also  Blood sugars are mostly high before supper recently and as high as 283  Sugars after supper are quite variable with high readings last month a couple of times and recently not checked very  much  Her overall blood sugar average is 128 but range is 42-283  Hypoglycemia mostly on waking up last month and occasionally in the afternoons and once 2 nights ago with a glucose of 49  She had been Given Invokana previously and this was stopped because of unclear benefit Taking only 1 g metformin because of renal dysfunction Not clear why she has gained weight since her last visit     Oral hypoglycemic drugs: Metformin ER, 500 mg twice a day       Side effects from medications: None  Proper timing of medications in relation to meals: Yes.         Monitors blood glucose:  2-3 times a day on average.    Glucometer: Freestyle          Meals: 3 meals per day.  Nutrisystem for 4 weeks; at breakfast she is eating a muffin with egg. Protein bar at lunch, small portions at supper         Physical activity: exercise: some bike           Dietician visit: Most recent: Years ago      Complications: are: Neuropathy     Wt Readings from Last 3 Encounters:  12/05/13 176 lb 9.6 oz (80.105 kg)  09/01/13 169 lb 12.8 oz (77.021 kg)  07/04/13 176 lb 12.8 oz (80.196 kg)   A1c on 05/23/13 was 7.9% from outside lab  Lab Results  Component Value Date   HGBA1C 7.8* 12/05/2013   HGBA1C 7.3* 07/04/2013   HGBA1C 7.6* 01/11/2013   Lab Results  Component Value Date   MICROALBUR 40.0* 01/11/2013   LDLCALC 32 12/05/2013   CREATININE 1.4* 12/05/2013       Medication List       This list is accurate as of: 12/05/13 11:59 PM.  Always use your most recent med list.               aspirin 81 MG tablet  Take 81 mg by mouth daily.     betamethasone dipropionate 0.05 % cream  Commonly known as:  DIPROLENE     CRESTOR 40 MG tablet  Generic drug:  rosuvastatin  Take 40 mg by mouth daily.     DULoxetine 60 MG capsule  Commonly known as:  CYMBALTA  60 mg.     felodipine 5 MG 24 hr tablet  Commonly known as:  PLENDIL  Take 5 mg by mouth daily.     fenofibrate 145 MG tablet  Commonly known as:   TRICOR  Take 1 tablet (145 mg total) by mouth every evening.     FLECTOR 1.3 % Ptch  Generic drug:  diclofenac     gabapentin 600 MG tablet  Commonly known as:  NEURONTIN  Take 1 tablet (600 mg total) by mouth 3 (three) times daily.     glucose blood test strip  Commonly known as:  FREESTYLE LITE  Use as instructed to check blood sugars 3 times per day, dx code 250.00     HYDROcodone-acetaminophen 10-325 MG per tablet  Commonly known as:  NORCO  Take 1 tablet by mouth every 6 (six) hours as needed for moderate pain.     Insulin Glargine 300 UNIT/ML Sopn  Commonly known as:  TOUJEO SOLOSTAR  Inject 60 Units into the skin daily before breakfast.     insulin lispro 100 UNIT/ML injection  Commonly known as:  HUMALOG  Inject 30-45 Units into the skin 3 (three) times daily before meals. 40 in the am, 30 at lunch, 40-50 units at supper     Insulin Pen Needle 31G X 5 MM Misc  Commonly known as:  B-D UF III MINI PEN NEEDLES  Use 3 pen needles per day     Insulin Syringe-Needle U-100 30G X 5/16" 0.5 ML Misc  1 each by Does not apply route 4 (four) times daily.     RELION INSULIN SYR .3CC/29G 29G X 1/2" 0.3 ML Misc  Generic drug:  Insulin Syringe-Needle U-100     metFORMIN 500 MG 24 hr tablet  Commonly known as:  GLUCOPHAGE-XR     montelukast 10 MG tablet  Commonly known as:  SINGULAIR  Take 10 mg by mouth at bedtime.     multivitamin with minerals Tabs tablet  Take 1 tablet by mouth daily.     neomycin-polymyxin b-dexamethasone 3.5-10000-0.1 Oint  Commonly known as:  MAXITROL     omeprazole 20 MG capsule  Commonly known as:  PRILOSEC  Take 20 mg by mouth daily as needed. For acid reflux     propranolol ER 120 MG 24 hr capsule  Commonly known as:  INDERAL LA  Take 120 mg by mouth daily.     traMADol 50 MG tablet  Commonly known as:  ULTRAM  50 mg.     VITAMIN B-12 PO  Take 1 tablet by mouth daily.     VITAMIN D PO  Take 1 tablet by mouth daily.         Allergies:  Allergies  Allergen Reactions  . Sulfa Antibiotics     Other reaction(s): RASH    Past Medical History  Diagnosis Date  . Anemia   . Arthritis   . Blood transfusion   . Asthma   . Diabetes mellitus   . Hyperlipidemia   . Neuromuscular disorder   . Generalized headaches   . Nasal congestion   . Cough   . Cancer     bilateral breast  . Allergy   . Complication of anesthesia     diff  waking up  . Hypertension     dr Ouida Sills    Past Surgical History  Procedure Laterality Date  . Total knee arthroplasty    . Shoulder arthroscopy      bilateral  . Breast surgery  06/2007    right mastectomy  . Breast surgery  2011    left  . Kidney stone surgery    . Back surgery    . Foot surgery      Family History  Problem Relation Age of Onset  . Osteoporosis Mother   . Heart disease Father   . Cancer Brother   . Heart disease Sister     Social History:  reports that she quit smoking about 29 years ago. She has never used smokeless tobacco. She reports that she drinks alcohol. She reports that she does not use illicit drugs.  Review of Systems:  Previously on felodipine 5 mg for high blood pressure, not clear what she is taking Inderal for. She was told to stop the felodipine because of low normal blood pressure and high creatinine but was told by PCP to continue this.   Chronic kidney disease: Creatinine has been  upper normal, also followed periodically by nephrologist  HYPERLIPIDEMIA: The lipid abnormality consists of elevated  triglycerides, has been treated with TriCor.  She is still complaining of back pain despite surgery  She has chronic pains and paresthesia in her feet and lower legs, and takes Percocet twice a day, asking for refill today   Also on gabapentin 600 mg Last diabetic foot exam was in 02/2013      Examination:   BP 104/47  Pulse 74  Temp(Src) 97.7 F (36.5 C)  Resp 14  Ht 5\' 5"  (1.651 m)  Wt 176 lb 9.6 oz (80.105 kg)   BMI 29.39 kg/m2  SpO2 94%  Body mass index is 29.39 kg/(m^2).   No pedal edema  ASSESSMENT/ PLAN:   Diabetes type 2:  The patient's diabetes control appears to be still inadequate and with  significant fluctuation See history of present illness for detailed discussion of her blood sugar patterns She is adjusting her insulin more based on her pre-meal blood sugars rather than any blood sugar trend or what she is eating Previously had lost weight on a Nutrisystem diet but has gained back this weight and is also not able to exercise much Blood sugars are significantly low when she takes larger amounts of insulin empirically Continues to have high readings in the afternoon probably from inadequate daytime basal  Have discussed with the patient the option of using Toujeo for once a day convenience and as this would provide better control of her blood sugars over 24 hours and hopefully with less overnight hypoglycemia. Discussed dosage equivalence and may need higher doses than the Lantus. She also needs to do better with adjusting the Humalog  based on meal size rather than blood sugar level and not exceed the specified amounts; may do better with postprandial administration  CKD/nephropathy: Creatinine has been high normal and will check this again  History of hypertension: She continues to take felodipine from PCP, blood pressure is again low normal  NEUROPATHY: She continues to have chronic pains and also back pain taking chronic narcotic analgesics.  Hydrocodone 10 mg prescription, 100 tablets given and she continues to get this from our office, apparently not getting it from other physicians  Recommended the following changes today:  Patient Instructions  Do not take over 20 Humalog at lunch and 40 at supper  Switch lantus to Toujeo 60 units in ams only; may go to 65 if am sugar stays over 150  More sugars after supper;   May trake Humolog just after meal if not sure of  intake     Prescription for hydrocodone 10 mg, 100 tablets given    Counseling time over 50% of today's 25 minute visit  Vernal Rutan 12/06/2013, 10:23 AM    Appointment on 12/05/2013  Component Date Value Ref Range Status  . Hemoglobin A1C 12/05/2013 7.8* 4.6 - 6.5 % Final   Glycemic Control Guidelines for People with Diabetes:Non Diabetic:  <6%Goal of Therapy: <7%Additional Action Suggested:  >8%   . Sodium 12/05/2013 141  135 - 145 mEq/L Final  . Potassium 12/05/2013 4.3  3.5 - 5.1 mEq/L Final  . Chloride 12/05/2013 103  96 - 112 mEq/L Final  . CO2 12/05/2013 23  19 - 32 mEq/L Final  . Glucose, Bld 12/05/2013 51 Repeated and verified X2.* 70 - 99 mg/dL Final  . BUN 12/05/2013 27* 6 - 23 mg/dL Final  . Creatinine, Ser 12/05/2013 1.4* 0.4 - 1.2 mg/dL Final  . Total Bilirubin 12/05/2013 0.4  0.2 - 1.2 mg/dL Final  . Alkaline Phosphatase 12/05/2013 53  39 - 117 U/L Final  . AST 12/05/2013 27  0 - 37 U/L Final  . ALT 12/05/2013 19  0 - 35 U/L Final  . Total Protein 12/05/2013 7.3  6.0 - 8.3 g/dL Final  . Albumin 12/05/2013 4.0  3.5 - 5.2 g/dL Final  . Calcium 12/05/2013 10.0  8.4 - 10.5 mg/dL Final  . GFR 12/05/2013 38.15* >60.00 mL/min Final  . Cholesterol 12/05/2013 142  0 - 200 mg/dL Final   ATP III Classification       Desirable:  < 200 mg/dL               Borderline High:  200 - 239 mg/dL          High:  > = 240 mg/dL  . Triglycerides 12/05/2013 328.0* 0.0 - 149.0 mg/dL Final   Normal:  <150 mg/dLBorderline High:  150 - 199 mg/dL  . HDL 12/05/2013 44.10  >39.00 mg/dL Final  . VLDL 12/05/2013 65.6* 0.0 - 40.0 mg/dL Final  . LDL Cholesterol 12/05/2013 32  0 - 99 mg/dL Final  . Total CHOL/HDL Ratio 12/05/2013 3   Final                  Men          Women1/2 Average Risk     3.4          3.3Average Risk          5.0          4.42X Average Risk          9.6  7.13X Average Risk          15.0          11.0                      . NonHDL 12/05/2013 97.90   Final  . Free T4  12/05/2013 1.11  0.60 - 1.60 ng/dL Final  . TSH 12/05/2013 1.55  0.35 - 4.50 uIU/mL Final

## 2013-12-06 NOTE — Progress Notes (Signed)
Quick Note:  Please let patient know that the kidney test is a little worse, this could be from low blood pressure, recommend stopping felodipine; also need to have her take fenofibrate every other day, results sent to PCP ______

## 2013-12-07 ENCOUNTER — Telehealth: Payer: Self-pay | Admitting: Endocrinology

## 2013-12-07 ENCOUNTER — Other Ambulatory Visit: Payer: Self-pay | Admitting: *Deleted

## 2013-12-07 MED ORDER — FENOFIBRATE 145 MG PO TABS: 145.0000 mg | ORAL_TABLET | Freq: Every evening | ORAL | Status: DC

## 2013-12-07 NOTE — Telephone Encounter (Signed)
The patient progress note has been faxed to PCP with changes

## 2013-12-07 NOTE — Telephone Encounter (Signed)
Patient stated that Pharmacy did not receive fax order for meds fenofibrate, could you please resend it.  Patient Primary Dr would like for you to send instruction for patient medication treatment to [facility]. Phone# [phone].  Thank you

## 2013-12-07 NOTE — Telephone Encounter (Signed)
rx sent for fenofibrate, not sure what PCP needs?

## 2013-12-27 ENCOUNTER — Other Ambulatory Visit: Payer: Self-pay | Admitting: *Deleted

## 2013-12-27 MED ORDER — GLUCOSE BLOOD VI STRP: ORAL_STRIP | Status: DC

## 2014-01-06 DIAGNOSIS — I739 Peripheral vascular disease, unspecified: Secondary | ICD-10-CM

## 2014-01-06 DIAGNOSIS — I779 Disorder of arteries and arterioles, unspecified: Secondary | ICD-10-CM | POA: Insufficient documentation

## 2014-01-06 DIAGNOSIS — E049 Nontoxic goiter, unspecified: Secondary | ICD-10-CM | POA: Insufficient documentation

## 2014-01-06 DIAGNOSIS — M1990 Unspecified osteoarthritis, unspecified site: Secondary | ICD-10-CM | POA: Insufficient documentation

## 2014-01-06 DIAGNOSIS — M48061 Spinal stenosis, lumbar region without neurogenic claudication: Secondary | ICD-10-CM | POA: Insufficient documentation

## 2014-01-06 DIAGNOSIS — I2510 Atherosclerotic heart disease of native coronary artery without angina pectoris: Secondary | ICD-10-CM | POA: Insufficient documentation

## 2014-01-06 DIAGNOSIS — J45909 Unspecified asthma, uncomplicated: Secondary | ICD-10-CM | POA: Insufficient documentation

## 2014-01-06 DIAGNOSIS — G43909 Migraine, unspecified, not intractable, without status migrainosus: Secondary | ICD-10-CM | POA: Insufficient documentation

## 2014-02-21 ENCOUNTER — Other Ambulatory Visit: Payer: Self-pay | Admitting: Endocrinology

## 2014-03-05 ENCOUNTER — Other Ambulatory Visit: Payer: Self-pay | Admitting: *Deleted

## 2014-03-05 ENCOUNTER — Telehealth: Payer: Self-pay | Admitting: Endocrinology

## 2014-03-05 MED ORDER — GLUCOSE BLOOD VI STRP: ORAL_STRIP | Status: DC

## 2014-03-05 NOTE — Telephone Encounter (Signed)
Patient states that she needs to get her test strips, a code was changed?   [place]   Thank You

## 2014-03-05 NOTE — Telephone Encounter (Signed)
rx faxed

## 2014-03-07 ENCOUNTER — Encounter: Payer: Self-pay | Admitting: Endocrinology

## 2014-03-07 ENCOUNTER — Other Ambulatory Visit (INDEPENDENT_AMBULATORY_CARE_PROVIDER_SITE_OTHER): Payer: Medicare Other

## 2014-03-07 ENCOUNTER — Ambulatory Visit (INDEPENDENT_AMBULATORY_CARE_PROVIDER_SITE_OTHER): Payer: Medicare Other | Admitting: Endocrinology

## 2014-03-07 VITALS — BP 120/60 | HR 66 | Temp 97.9°F | Resp 16 | Ht 65.0 in | Wt 179.8 lb

## 2014-03-07 DIAGNOSIS — E1165 Type 2 diabetes mellitus with hyperglycemia: Secondary | ICD-10-CM

## 2014-03-07 DIAGNOSIS — E785 Hyperlipidemia, unspecified: Secondary | ICD-10-CM

## 2014-03-07 DIAGNOSIS — E042 Nontoxic multinodular goiter: Secondary | ICD-10-CM

## 2014-03-07 DIAGNOSIS — Z23 Encounter for immunization: Secondary | ICD-10-CM

## 2014-03-07 DIAGNOSIS — IMO0002 Reserved for concepts with insufficient information to code with codable children: Secondary | ICD-10-CM

## 2014-03-07 DIAGNOSIS — E1149 Type 2 diabetes mellitus with other diabetic neurological complication: Secondary | ICD-10-CM

## 2014-03-07 DIAGNOSIS — E1142 Type 2 diabetes mellitus with diabetic polyneuropathy: Secondary | ICD-10-CM

## 2014-03-07 LAB — LIPID PANEL
CHOL/HDL RATIO: 4
Cholesterol: 125 mg/dL (ref 0–200)
HDL: 31.6 mg/dL — ABNORMAL LOW (ref >39.00)
NonHDL: 93.4
Triglycerides: 209 mg/dL — ABNORMAL HIGH (ref 0.0–149.0)
VLDL: 41.8 mg/dL — ABNORMAL HIGH (ref 0.0–40.0)

## 2014-03-07 LAB — BASIC METABOLIC PANEL
BUN: 30 mg/dL — ABNORMAL HIGH (ref 6–23)
CALCIUM: 9.4 mg/dL (ref 8.4–10.5)
CO2: 25 meq/L (ref 19–32)
Chloride: 104 mEq/L (ref 96–112)
Creatinine, Ser: 1.2 mg/dL (ref 0.4–1.2)
GFR: 45.49 mL/min — ABNORMAL LOW (ref >60.00)
Glucose, Bld: 172 mg/dL — ABNORMAL HIGH (ref 70–99)
Potassium: 4.4 mEq/L (ref 3.5–5.1)
SODIUM: 139 meq/L (ref 135–145)

## 2014-03-07 LAB — MICROALBUMIN / CREATININE URINE RATIO
Creatinine,U: 88 mg/dL
MICROALB/CREAT RATIO: 136.2 mg/g — AB (ref 0.0–30.0)

## 2014-03-07 LAB — HEMOGLOBIN A1C: Hgb A1c MFr Bld: 8.1 % — ABNORMAL HIGH (ref 4.6–6.5)

## 2014-03-07 LAB — LDL CHOLESTEROL, DIRECT: Direct LDL: 66.8 mg/dL

## 2014-03-07 MED ORDER — HYDROCODONE-ACETAMINOPHEN 10-325 MG PO TABS: 1.0000 | ORAL_TABLET | Freq: Four times a day (QID) | ORAL | Status: DC | PRN

## 2014-03-07 NOTE — Patient Instructions (Addendum)
Toujeo 56 units in am and adjust weekly based on am sugars, keep am sugars 90-140  Take 20-25 Humalog at Bfst and 15 at lunch to keep sugar <180  More sugars at lunch

## 2014-03-07 NOTE — Progress Notes (Addendum)
Patient ID: Andrea Wallace, female   DOB: Jul 12, 1931, 80 y.o.   MRN: [id number]   Reason for Appointment: Diabetes follow-up   History of Present Illness   Diagnosis: Type 2 DIABETES MELITUS, date of diagnosis 1999  Previous history: She has been on insulin for several years to control her diabetes and usually requires large doses Has been on basal bolus regimen for the last few years, taking Lantus twice a day.  Her blood sugars tend to fluctuate significantly but her A1c is usually at a reasonable level She does not clearly benefit from GLP-1 drugs and had been taking Byetta to help with postprandial hyperglycemia, this has been stopped  RECENT history: Insulin regimen: Toujeo 60 qd, Humalog 35 am; 0-20 acl 40-45 acs        On her last visit she was switched from twice a day Lantus to Toujeo especially since she would arbitrarily change her Lantus doses and was having some low sugars on waking up She has taken 60 units Toujeo as directed and her fasting blood sugars have been overall  fairly good She was also asked to not take very large doses of Humalog at supper However she has stopped taking her Humalog at breakfast and lunch for no apparent reason  Current blood sugar patterns   Her fasting blood sugars are averaging overall about 130 but since last Thursday have been mostly below 90  No overnight hypoglycemia  No blood sugar monitoring after breakfast or before and after lunch  Blood sugars at suppertime are significantly high with average about 215 and highest 345  Blood sugars after supper/bedtime are also overall high but relatively better with average about 175 and highest 348  Has only one or 2 low readings after her evening meal, lowest 47  Overall average glucose 173  She had been Given Victoza, Byetta and Invokana previously and this was stopped because of unclear benefit Taking only 1 g metformin because of renal dysfunction Again she has gained weight since her  last visit     Oral hypoglycemic drugs: Metformin ER, 500 mg twice a day       Side effects from medications: None  Proper timing of medications in relation to meals: Yes.         Monitors blood glucose:  2-3 times a day on average.    Glucometer: Freestyle          Meals: 3 meals per day.  Nutrisystem usually; at breakfast she is eating a  Peanut butter toast.  Protein bar at lunch, small portions at supper         Physical activity: exercise: some on the exercise bike           Dietician visit: Most recent: Years ago      Complications: are: Neuropathy     Wt Readings from Last 3 Encounters:  03/07/14 179 lb 12.8 oz (81.557 kg)  12/05/13 176 lb 9.6 oz (80.105 kg)  09/01/13 169 lb 12.8 oz (77.021 kg)     Lab Results  Component Value Date   HGBA1C 7.8* 12/05/2013   HGBA1C 7.3* 07/04/2013   HGBA1C 7.6* 01/11/2013   Lab Results  Component Value Date   MICROALBUR 40.0* 01/11/2013   LDLCALC 32 12/05/2013   CREATININE 1.4* 12/05/2013       Medication List       This list is accurate as of: 03/07/14 12:44 PM.  Always use your most recent med list.  aspirin 81 MG tablet  Take 81 mg by mouth daily.     betamethasone dipropionate 0.05 % cream  Commonly known as:  DIPROLENE     CRESTOR 40 MG tablet  Generic drug:  rosuvastatin  Take 40 mg by mouth daily.     DULoxetine 60 MG capsule  Commonly known as:  CYMBALTA  60 mg.     felodipine 5 MG 24 hr tablet  Commonly known as:  PLENDIL  Take 5 mg by mouth daily.     fenofibrate 145 MG tablet  Commonly known as:  TRICOR  Take 1 tablet (145 mg total) by mouth every evening.     FLECTOR 1.3 % Ptch  Generic drug:  diclofenac     gabapentin 600 MG tablet  Commonly known as:  NEURONTIN  Take 1 tablet (600 mg total) by mouth 3 (three) times daily.     glucose blood test strip  Commonly known as:  FREESTYLE LITE  Use as instructed to check blood sugars 3 times per day, dx code E11.49      HYDROcodone-acetaminophen 10-325 MG per tablet  Commonly known as:  NORCO  Take 1 tablet by mouth every 6 (six) hours as needed for moderate pain.     insulin lispro 100 UNIT/ML injection  Commonly known as:  HUMALOG  Inject 30-45 Units into the skin 3 (three) times daily before meals. 40 in the am, 30 at lunch, 40-50 units at supper     Insulin Pen Needle 31G X 5 MM Misc  Commonly known as:  B-D UF III MINI PEN NEEDLES  Use 3 pen needles per day     Insulin Syringe-Needle U-100 30G X 5/16" 0.5 ML Misc  1 each by Does not apply route 4 (four) times daily.     RELION INSULIN SYR .3CC/29G 29G X 1/2" 0.3 ML Misc  Generic drug:  Insulin Syringe-Needle U-100     metFORMIN 500 MG 24 hr tablet  Commonly known as:  GLUCOPHAGE-XR     montelukast 10 MG tablet  Commonly known as:  SINGULAIR  Take 10 mg by mouth at bedtime.     multivitamin with minerals Tabs tablet  Take 1 tablet by mouth daily.     neomycin-polymyxin b-dexamethasone 3.5-10000-0.1 Oint  Commonly known as:  MAXITROL     omeprazole 20 MG capsule  Commonly known as:  PRILOSEC  Take 20 mg by mouth daily as needed. For acid reflux     propranolol ER 120 MG 24 hr capsule  Commonly known as:  INDERAL LA  Take 120 mg by mouth daily.     TOUJEO SOLOSTAR 300 UNIT/ML Sopn  Generic drug:  Insulin Glargine  INJECT 60 UNITS SUBCUTANEOUSLY ONCE DAILY BEFORE BREAKFAST     traMADol 50 MG tablet  Commonly known as:  ULTRAM  50 mg.     VITAMIN B-12 PO  Take 1 tablet by mouth daily.     VITAMIN D PO  Take 1 tablet by mouth daily.        Allergies:  Allergies  Allergen Reactions  . Codeine Sulfate Other (See Comments)  . Etodolac Other (See Comments)  . Oxycodone-Acetaminophen Other (See Comments)  . Sulfa Antibiotics     Other reaction(s): RASH  . Sulfamethoxazole-Trimethoprim Other (See Comments)    Past Medical History  Diagnosis Date  . Anemia   . Arthritis   . Blood transfusion   . Asthma   . Diabetes  mellitus   . Hyperlipidemia   .  Neuromuscular disorder   . Generalized headaches   . Nasal congestion   . Cough   . Cancer     bilateral breast  . Allergy   . Complication of anesthesia     diff  waking up  . Hypertension     dr Ouida Sills    Past Surgical History  Procedure Laterality Date  . Total knee arthroplasty    . Shoulder arthroscopy      bilateral  . Breast surgery  06/2007    right mastectomy  . Breast surgery  2011    left  . Kidney stone surgery    . Back surgery    . Foot surgery      Family History  Problem Relation Age of Onset  . Osteoporosis Mother   . Heart disease Father   . Cancer Brother   . Heart disease Sister     Social History:  reports that she quit smoking about 29 years ago. She has never used smokeless tobacco. She reports that she drinks alcohol. She reports that she does not use illicit drugs.  Review of Systems:  Previously on felodipine 5 mg for high blood pressure, not clear what she is taking Inderal for. She was told to stop the felodipine because of low normal blood pressure and high creatinine but was told by PCP to continue this.   Chronic kidney disease: Creatinine has been  upper normal, also followed periodically by nephrologist  HYPERLIPIDEMIA: The lipid abnormality consists of elevated  triglycerides, has been treated with TriCor.  She is still complaining of back pain despite surgery  She has chronic pains and paresthesia in her feet and lower legs, and takes Percocet twice a day, asking for refill today   Also on gabapentin 600 mg Last diabetic foot exam was in 02/2013      Examination:   BP 120/60  Pulse 66  Temp(Src) 97.9 F (36.6 C)  Resp 16  Ht 5\' 5"  (1.651 m)  Wt 179 lb 12.8 oz (81.557 kg)  BMI 29.92 kg/m2  SpO2 95%  Body mass index is 29.92 kg/(m^2).   No pedal edema  Foot exam done with the following findings Decreased absent monofilament sensation on her toes and distal plantar surfaces.  Pedal  pulses normal on the left and absent on the right Deformities of left second and third toes present  ASSESSMENT/ PLAN:   Diabetes type 2:  The patient's diabetes control is relatively better with fasting blood sugars improved on Toujeo compared to Lantus However she appears to have significant postprandial hyperglycemia and progressively higher readings during the day because of not covering breakfast and lunch with Humalog See history of present illness for detailed discussion of her management of problems identified and blood sugar patterns She stopped her mealtime coverage at breakfast and lunch for no apparent reason and not checking sugars at lunch Explained to her that her blood sugar is high today even 4 hours after her small breakfast and she does need mealtime coverage consistently during the day Recently fasting blood sugars are trending to be low normal A1c is to be checked today  Insulin changes and glucose monitoring directions as in patient instructions  CKD/nephropathy: Creatinine has been high normal and will check this again as well as urine microalbumin  History of hypertension: She continues to take felodipine from PCP, blood pressure is again low normal for her age. Not clear if she is needing this  NEUROPATHY: She continues to have chronic leg  pains and also back pain taking chronic narcotic analgesics.  Hydrocodone 10 mg prescription, 100 tablets given and she continues to get this from our office, apparently not getting it from other physicians  Recommended the following changes today:  Patient Instructions  Toujeo 56 units in am and adjust weekly based on am sugars, keep am sugars 90-140  Take 20-25 Humalog at Bfst and 15 at lunch to keep sugar <180  More sugars at lunch     Prescription for hydrocodone 10 mg, 100 tablets given today as before    Counseling time over 50% of today's 25 minute visit  Gilliam 03/07/2014, 12:44 PM

## 2014-03-13 ENCOUNTER — Other Ambulatory Visit: Payer: Self-pay | Admitting: *Deleted

## 2014-03-13 MED ORDER — LOSARTAN POTASSIUM 25 MG PO TABS: 25.0000 mg | ORAL_TABLET | Freq: Every day | ORAL | Status: DC

## 2014-03-13 NOTE — Telephone Encounter (Signed)
Patient is having all the side affect of this medication (Losartan) and haven't even taken it yet, Patient is afraid to take meds, please advise on what to do

## 2014-03-14 NOTE — Telephone Encounter (Signed)
This medication does not cause side effects at 25 mg.. She has taken  medications before like Diovan and is important to control diabetic protein leakage from the kidneys

## 2014-03-14 NOTE — Telephone Encounter (Signed)
Please see below and advise.

## 2014-03-15 NOTE — Telephone Encounter (Signed)
Noted, patient is aware. 

## 2014-03-24 DIAGNOSIS — I700 Atherosclerosis of aorta: Secondary | ICD-10-CM | POA: Insufficient documentation

## 2014-04-10 ENCOUNTER — Other Ambulatory Visit: Payer: Self-pay | Admitting: Endocrinology

## 2014-06-01 ENCOUNTER — Other Ambulatory Visit: Payer: Self-pay | Admitting: Endocrinology

## 2014-06-07 ENCOUNTER — Ambulatory Visit (INDEPENDENT_AMBULATORY_CARE_PROVIDER_SITE_OTHER): Payer: Medicare Other | Admitting: Endocrinology

## 2014-06-07 ENCOUNTER — Encounter: Payer: Self-pay | Admitting: Endocrinology

## 2014-06-07 VITALS — BP 102/60 | HR 66 | Temp 98.3°F | Resp 16 | Ht 65.0 in | Wt 179.8 lb

## 2014-06-07 DIAGNOSIS — E1165 Type 2 diabetes mellitus with hyperglycemia: Secondary | ICD-10-CM

## 2014-06-07 DIAGNOSIS — IMO0002 Reserved for concepts with insufficient information to code with codable children: Secondary | ICD-10-CM

## 2014-06-07 DIAGNOSIS — E119 Type 2 diabetes mellitus without complications: Secondary | ICD-10-CM

## 2014-06-07 DIAGNOSIS — E1142 Type 2 diabetes mellitus with diabetic polyneuropathy: Secondary | ICD-10-CM

## 2014-06-07 DIAGNOSIS — N182 Chronic kidney disease, stage 2 (mild): Secondary | ICD-10-CM

## 2014-06-07 DIAGNOSIS — E782 Mixed hyperlipidemia: Secondary | ICD-10-CM

## 2014-06-07 DIAGNOSIS — E042 Nontoxic multinodular goiter: Secondary | ICD-10-CM

## 2014-06-07 LAB — LIPID PANEL
Cholesterol: 124 mg/dL (ref 0–200)
HDL: 33.3 mg/dL — AB (ref >39.00)
NonHDL: 90.7
Total CHOL/HDL Ratio: 4
Triglycerides: 245 mg/dL — ABNORMAL HIGH (ref 0.0–149.0)
VLDL: 49 mg/dL — AB (ref 0.0–40.0)

## 2014-06-07 LAB — BASIC METABOLIC PANEL
BUN: 44 mg/dL — ABNORMAL HIGH (ref 6–23)
CO2: 28 meq/L (ref 19–32)
CREATININE: 1.41 mg/dL — AB (ref 0.40–1.20)
Calcium: 9.5 mg/dL (ref 8.4–10.5)
Chloride: 107 mEq/L (ref 96–112)
GFR: 38.1 mL/min — ABNORMAL LOW (ref >60.00)
Glucose, Bld: 57 mg/dL — ABNORMAL LOW (ref 70–99)
Potassium: 5.1 mEq/L (ref 3.5–5.1)
SODIUM: 141 meq/L (ref 135–145)

## 2014-06-07 LAB — TSH: TSH: 2.3 u[IU]/mL (ref 0.35–4.50)

## 2014-06-07 LAB — HEMOGLOBIN A1C: Hgb A1c MFr Bld: 8.5 % — ABNORMAL HIGH (ref 4.6–6.5)

## 2014-06-07 LAB — LDL CHOLESTEROL, DIRECT: Direct LDL: 61 mg/dL

## 2014-06-07 MED ORDER — HYDROCODONE-ACETAMINOPHEN 10-325 MG PO TABS: 1.0000 | ORAL_TABLET | Freq: Four times a day (QID) | ORAL | Status: DC | PRN

## 2014-06-07 MED ORDER — TRAMADOL HCL 50 MG PO TABS: 50.0000 mg | ORAL_TABLET | Freq: Two times a day (BID) | ORAL | Status: DC | PRN

## 2014-06-07 NOTE — Patient Instructions (Addendum)
Stop FELODIPINE, continue Losartan  Humalog at lunch 15-20 for a full meal or more than 1 carb  Please check blood sugars at least half the time about 2 hours after any meal and times per week on waking up. Please bring blood sugar monitor to each visit

## 2014-06-07 NOTE — Progress Notes (Signed)
Patient ID: Andrea Wallace, female   DOB: 1932-03-09, 81 y.o.   MRN: [id number]   Reason for Appointment: Diabetes follow-up   History of Present Illness   Diagnosis: Type 2 DIABETES MELITUS, date of diagnosis 1999  Previous history: She has been on insulin for several years to control her diabetes and usually requires large doses Has been on basal bolus regimen for the last few years, taking Lantus twice a day.  Her blood sugars tend to fluctuate significantly but her A1c is usually at a reasonable level She does not clearly benefit from GLP-1 drugs and had been taking Byetta to help with postprandial hyperglycemia, this has been stopped  Insulin regimen: Toujeo 60 qd, Humalog 35 am; 0 acl 40-45 acs        RECENT history She has taken 60 units Toujeo as directed once a day and has not been adjusting her dose; previously was arbitrarily adjusting her Lantus when she was taking this twice a day Again her fasting blood sugars have been overall  fairly good by history However she did not bring her monitor for download and not clear if she is recalling her blood sugars accurately She was also asked to start back taking Humalog at least at breakfast and also at lunch if eating a larger meal Currently not taking Humalog consistently before larger meals especially if she forgets when she goes out Unable to review her blood sugar patterns today; she does not think she has had any significantly high readings Has had only one low blood sugar of 44 before supper Blood sugar readings by recall:   PRE-MEAL Breakfast Lunch Dinner Bedtime Overall  Glucose range: <130 80-130 120-150 130-160   Mean/median:         She had been Given Victoza, Byetta and Invokana previously and this was stopped because of unclear benefit Taking only 1 g metformin because of renal dysfunction Her weight has been stable     Oral hypoglycemic drugs: Metformin ER, 500 mg twice a day       Side effects from medications:  None  Proper timing of medications in relation to meals: Yes.         Monitors blood glucose:  2-3 times a day on average.    Glucometer: Freestyle          Meals: 3 meals per day.  Nutrisystem usually; at breakfast she is eating a  Peanut butter toast.  Protein bar at lunch, small portions at supper         Physical activity: exercise: some on the exercise bike           Dietician visit: Most recent: Years ago       Complications: are: Neuropathy, microalbuminuria     Wt Readings from Last 3 Encounters:  06/07/14 179 lb 12.8 oz (81.557 kg)  03/07/14 179 lb 12.8 oz (81.557 kg)  12/05/13 176 lb 9.6 oz (80.105 kg)     Lab Results  Component Value Date   HGBA1C 8.5* 06/07/2014   HGBA1C 8.1* 03/07/2014   HGBA1C 7.8* 12/05/2013   Lab Results  Component Value Date   MICROALBUR 119.9 Repeated and verified X2.* 03/07/2014   LDLCALC 32 12/05/2013   CREATININE 1.41* 06/07/2014       Medication List       This list is accurate as of: 06/07/14  5:11 PM.  Always use your most recent med list.  aspirin 81 MG tablet  Take 81 mg by mouth daily.     betamethasone dipropionate 0.05 % cream  Commonly known as:  DIPROLENE     CRESTOR 40 MG tablet  Generic drug:  rosuvastatin  Take 40 mg by mouth daily.     DULoxetine 60 MG capsule  Commonly known as:  CYMBALTA  60 mg.     fenofibrate 145 MG tablet  Commonly known as:  TRICOR  Take 1 tablet (145 mg total) by mouth every evening.     FLECTOR 1.3 % Ptch  Generic drug:  diclofenac     gabapentin 600 MG tablet  Commonly known as:  NEURONTIN  Take 1 tablet (600 mg total) by mouth 3 (three) times daily.     glucose blood test strip  Commonly known as:  FREESTYLE LITE  Use as instructed to check blood sugars 3 times per day, dx code E11.49     HUMALOG 100 UNIT/ML injection  Generic drug:  insulin lispro  INJECT 40 UNITS UNDER THE SKIN BEFORE BREAKFAST, 30 UNITS BEFORE LUNCH AND 45 UNITS BEFORE SUPPER      HYDROcodone-acetaminophen 10-325 MG per tablet  Commonly known as:  NORCO  Take 1 tablet by mouth every 6 (six) hours as needed for moderate pain.     Insulin Pen Needle 31G X 5 MM Misc  Commonly known as:  B-D UF III MINI PEN NEEDLES  Use 3 pen needles per day     Insulin Syringe-Needle U-100 30G X 5/16" 0.5 ML Misc  1 each by Does not apply route 4 (four) times daily.     RELION INSULIN SYR .3CC/29G 29G X 1/2" 0.3 ML Misc  Generic drug:  Insulin Syringe-Needle U-100     losartan 25 MG tablet  Commonly known as:  COZAAR  Take 1 tablet (25 mg total) by mouth daily.     metFORMIN 500 MG 24 hr tablet  Commonly known as:  GLUCOPHAGE-XR     montelukast 10 MG tablet  Commonly known as:  SINGULAIR  Take 10 mg by mouth at bedtime.     multivitamin with minerals Tabs tablet  Take 1 tablet by mouth daily.     neomycin-polymyxin b-dexamethasone 3.5-10000-0.1 Oint  Commonly known as:  MAXITROL     omeprazole 20 MG capsule  Commonly known as:  PRILOSEC  Take 20 mg by mouth daily as needed. For acid reflux     propranolol ER 120 MG 24 hr capsule  Commonly known as:  INDERAL LA  Take 120 mg by mouth daily.     TOUJEO SOLOSTAR 300 UNIT/ML Sopn  Generic drug:  Insulin Glargine  INJECT 60 UNITS SUBCUTANEOUSLY ONCE DAILY BEFORE BREAKFAST     traMADol 50 MG tablet  Commonly known as:  ULTRAM  Take 1 tablet (50 mg total) by mouth every 12 (twelve) hours as needed.     VITAMIN B-12 PO  Take 1 tablet by mouth daily.     VITAMIN D PO  Take 1 tablet by mouth daily.        Allergies:  Allergies  Allergen Reactions  . Codeine Sulfate Other (See Comments)  . Etodolac Other (See Comments)  . Oxycodone-Acetaminophen Other (See Comments)  . Sulfa Antibiotics     Other reaction(s): RASH  . Sulfamethoxazole-Trimethoprim Other (See Comments)    Past Medical History  Diagnosis Date  . Anemia   . Arthritis   . Blood transfusion   . Asthma   . Diabetes mellitus   .  Hyperlipidemia   . Neuromuscular disorder   . Generalized headaches   . Nasal congestion   . Cough   . Cancer     bilateral breast  . Allergy   . Complication of anesthesia     diff  waking up  . Hypertension     dr Ouida Sills    Past Surgical History  Procedure Laterality Date  . Total knee arthroplasty    . Shoulder arthroscopy      bilateral  . Breast surgery  06/2007    right mastectomy  . Breast surgery  2011    left  . Kidney stone surgery    . Back surgery    . Foot surgery      Family History  Problem Relation Age of Onset  . Osteoporosis Mother   . Heart disease Father   . Cancer Brother   . Heart disease Sister     Social History:  reports that she quit smoking about 30 years ago. She has never used smokeless tobacco. She reports that she drinks alcohol. She reports that she does not use illicit drugs.  Review of Systems:  Because of her having increased microalbumin she was started on losartan on her last visit and told to stop felodipine She is still taking this every other day for unclear reasons Also on Inderal Her blood pressure is relatively low today She does check her blood pressure periodically at home and recent reading about BP 120/65  Chronic kidney disease: Creatinine has been  upper normal, also followed periodically by nephrologist  HYPERLIPIDEMIA: The lipid abnormality consists of elevated  triglycerides, has been treated with TriCor.  She is still complaining of back pain despite surgery  She has chronic pains and paresthesia in her feet and lower legs, and takes Percocet once or twice a day, mostly at night. She is asking for refill today She also has previously tried tramadol for pain relief during the day but not taking it   Also on gabapentin 600 mg and Cymbalta  Last diabetic foot exam was in 02/2014 with the following findings Decreased absent monofilament sensation on her toes and distal plantar surfaces.  Pedal pulses normal  on the left and absent on the right Deformities of left second and third toes present     Examination:   BP 102/60 mmHg  Pulse 66  Temp(Src) 98.3 F (36.8 C)  Resp 16  Ht 5\' 5"  (1.651 m)  Wt 179 lb 12.8 oz (81.557 kg)  BMI 29.92 kg/m2  SpO2 93%  Body mass index is 29.92 kg/(m^2).   No pedal edema  Repeat blood pressure 120/62 standing   Diabetic foot exam shows decreased to absent monofilament sensation in the toes and plantar surfaces, no skin lesions or ulcers on the feet and normal pedal pulses.  She does have some osteoarthritic changes in her toes especially left third distal toe She also has calluses distally on her feet on the metatarsal bases laterally and on the first toe base  ASSESSMENT/ PLAN:   Diabetes type 2:  The patient's diabetes control is difficult to assess as she did not bring her glucose monitor today for review Do not think that her recall of her home blood sugars is accurate She will have A1c checked today which has previously been over 8% Although she thinks her fasting readings are fairly good she probably has high postprandial readings as seen previously Also not clear if she is remembering to take her insulin consistently especially  when going out to eat. Overall still fairly insulin resistant with taking large amounts of insulin for her BMI For now we'll continue the same regimen until her next visit when we can review her home monitoring  CKD/nephropathy: Creatinine has been high normal and will check this again To have follow-up urine microalbumin on her next visit  History of hypertension: She has low normal blood pressure reading today Although she was told to stop felodipine and start using losartan for her nephropathy she is still taking the felodipine every other day Because of microalbuminuria she should continue taking losartan and stop her felodipine, discussed needing to avoid low blood pressures especially with her renal  dysfunction  NEUROPATHY: She continues to have chronic leg pains and also back pain taking chronic narcotic analgesics.  Also taking Cymbalta and gabapentin with some relief. Hydrocodone 10 mg prescription, 100 tablets given and she continues to get this from our office, is not getting it from other physicians Discussed general foot care  MIXED hyperlipidemia: Tends to have persistently high triglyceride levels  Recommended the following changes today:  Patient Instructions  Stop FELODIPINE, continue Losartan  Humalog at lunch 15-20 for a full meal or more than 1 carb  Please check blood sugars at least half the time about 2 hours after any meal and times per week on waking up. Please bring blood sugar monitor to each visit       Counseling time over 50% of today's 25 minute visit  Ramelo Oetken 06/07/2014, 5:11 PM    Addendum: Labs show increased A1c and mildly increased creatinine   Office Visit on 06/07/2014  Component Date Value Ref Range Status  . Hgb A1c MFr Bld 06/07/2014 8.5* 4.6 - 6.5 % Final   Glycemic Control Guidelines for People with Diabetes:Non Diabetic:  <6%Goal of Therapy: <7%Additional Action Suggested:  >8%   . Cholesterol 06/07/2014 124  0 - 200 mg/dL Final   ATP III Classification       Desirable:  < 200 mg/dL               Borderline High:  200 - 239 mg/dL          High:  > = 240 mg/dL  . Triglycerides 06/07/2014 245.0* 0.0 - 149.0 mg/dL Final   Normal:  <150 mg/dLBorderline High:  150 - 199 mg/dL  . HDL 06/07/2014 33.30* >39.00 mg/dL Final  . VLDL 06/07/2014 49.0* 0.0 - 40.0 mg/dL Final  . Total CHOL/HDL Ratio 06/07/2014 4   Final                  Men          Women1/2 Average Risk     3.4          3.3Average Risk          5.0          4.42X Average Risk          9.6          7.13X Average Risk          15.0          11.0                      . NonHDL 06/07/2014 90.70   Final   NOTE:  Non-HDL goal should be 30 mg/dL higher than patient's LDL goal (i.e. LDL  goal of < 70 mg/dL, would have non-HDL goal of < 100 mg/dL)  .  Sodium 06/07/2014 141  135 - 145 mEq/L Final  . Potassium 06/07/2014 5.1  3.5 - 5.1 mEq/L Final  . Chloride 06/07/2014 107  96 - 112 mEq/L Final  . CO2 06/07/2014 28  19 - 32 mEq/L Final  . Glucose, Bld 06/07/2014 57* 70 - 99 mg/dL Final  . BUN 06/07/2014 44* 6 - 23 mg/dL Final  . Creatinine, Ser 06/07/2014 1.41* 0.40 - 1.20 mg/dL Final  . Calcium 06/07/2014 9.5  8.4 - 10.5 mg/dL Final  . GFR 06/07/2014 38.10* >60.00 mL/min Final  . TSH 06/07/2014 2.30  0.35 - 4.50 uIU/mL Final  . Direct LDL 06/07/2014 61.0   Final   Optimal:  <100 mg/dLNear or Above Optimal:  100-129 mg/dLBorderline High:  130-159 mg/dLHigh:  160-189 mg/dLVery High:  >190 mg/dL

## 2014-06-14 ENCOUNTER — Other Ambulatory Visit: Payer: Self-pay | Admitting: Endocrinology

## 2014-06-17 ENCOUNTER — Other Ambulatory Visit: Payer: Self-pay | Admitting: Endocrinology

## 2014-06-18 NOTE — Progress Notes (Signed)
Quick Note:  Please let patient know that the A1c test is higher at 8.5, to call if blood sugars persistently high especially after meals Kidney test slightly worse and may improve with stopping felodipine Send copy to PCP ______

## 2014-07-03 ENCOUNTER — Other Ambulatory Visit: Payer: Self-pay | Admitting: Endocrinology

## 2014-07-25 ENCOUNTER — Other Ambulatory Visit: Payer: Self-pay | Admitting: Endocrinology

## 2014-09-19 ENCOUNTER — Other Ambulatory Visit: Payer: Self-pay | Admitting: Endocrinology

## 2014-09-19 ENCOUNTER — Other Ambulatory Visit: Payer: Self-pay

## 2014-09-19 DIAGNOSIS — Z1231 Encounter for screening mammogram for malignant neoplasm of breast: Secondary | ICD-10-CM

## 2014-09-20 ENCOUNTER — Encounter: Payer: Self-pay | Admitting: Endocrinology

## 2014-09-20 ENCOUNTER — Other Ambulatory Visit (INDEPENDENT_AMBULATORY_CARE_PROVIDER_SITE_OTHER): Payer: Medicare Other

## 2014-09-20 ENCOUNTER — Encounter: Payer: Self-pay | Admitting: *Deleted

## 2014-09-20 ENCOUNTER — Ambulatory Visit (INDEPENDENT_AMBULATORY_CARE_PROVIDER_SITE_OTHER): Payer: Medicare Other | Admitting: Endocrinology

## 2014-09-20 VITALS — BP 116/68 | HR 75 | Temp 98.0°F | Resp 16 | Ht 65.0 in | Wt 177.6 lb

## 2014-09-20 DIAGNOSIS — E1165 Type 2 diabetes mellitus with hyperglycemia: Secondary | ICD-10-CM | POA: Diagnosis not present

## 2014-09-20 DIAGNOSIS — N182 Chronic kidney disease, stage 2 (mild): Secondary | ICD-10-CM

## 2014-09-20 DIAGNOSIS — IMO0002 Reserved for concepts with insufficient information to code with codable children: Secondary | ICD-10-CM

## 2014-09-20 DIAGNOSIS — E1142 Type 2 diabetes mellitus with diabetic polyneuropathy: Secondary | ICD-10-CM

## 2014-09-20 LAB — URINALYSIS, ROUTINE W REFLEX MICROSCOPIC
Hgb urine dipstick: NEGATIVE
Ketones, ur: NEGATIVE
LEUKOCYTES UA: NEGATIVE
Nitrite: NEGATIVE
RBC / HPF: NONE SEEN (ref 0–?)
Specific Gravity, Urine: >=1.03 — AB (ref 1.000–1.030)
Total Protein, Urine: 100 — AB
UROBILINOGEN UA: 0.2 (ref 0.0–1.0)
Urine Glucose: NEGATIVE
pH: 5.5 (ref 5.0–8.0)

## 2014-09-20 LAB — COMPREHENSIVE METABOLIC PANEL
ALBUMIN: 3.9 g/dL (ref 3.5–5.2)
ALK PHOS: 53 U/L (ref 39–117)
ALT: 14 U/L (ref 0–35)
AST: 22 U/L (ref 0–37)
BILIRUBIN TOTAL: 0.4 mg/dL (ref 0.2–1.2)
BUN: 38 mg/dL — ABNORMAL HIGH (ref 6–23)
CHLORIDE: 105 meq/L (ref 96–112)
CO2: 27 meq/L (ref 19–32)
Calcium: 9.5 mg/dL (ref 8.4–10.5)
Creatinine, Ser: 1.43 mg/dL — ABNORMAL HIGH (ref 0.40–1.20)
GFR: 37.46 mL/min — ABNORMAL LOW (ref >60.00)
Glucose, Bld: 190 mg/dL — ABNORMAL HIGH (ref 70–99)
Potassium: 5.4 mEq/L — ABNORMAL HIGH (ref 3.5–5.1)
SODIUM: 138 meq/L (ref 135–145)
TOTAL PROTEIN: 6.8 g/dL (ref 6.0–8.3)

## 2014-09-20 LAB — MICROALBUMIN / CREATININE URINE RATIO
Creatinine,U: 147 mg/dL
Microalb Creat Ratio: 59.3 mg/g — ABNORMAL HIGH (ref 0.0–30.0)
Microalb, Ur: 87.2 mg/dL — ABNORMAL HIGH (ref 0.0–1.9)

## 2014-09-20 LAB — HEMOGLOBIN A1C: Hgb A1c MFr Bld: 7.9 % — ABNORMAL HIGH (ref 4.6–6.5)

## 2014-09-20 MED ORDER — TRAMADOL HCL 50 MG PO TABS: 50.0000 mg | ORAL_TABLET | Freq: Two times a day (BID) | ORAL | Status: DC | PRN

## 2014-09-20 MED ORDER — HYDROCODONE-ACETAMINOPHEN 10-325 MG PO TABS: 1.0000 | ORAL_TABLET | Freq: Four times a day (QID) | ORAL | Status: DC | PRN

## 2014-09-20 NOTE — Patient Instructions (Signed)
Toujeo 52 u nits in am  HUMALOG 45 at supper Must take Humalog in am even if sugar normal  If eating a sandwich at lunch or starchy food take Humalog 10-15 U  Please check blood sugars at least half the time about 2 hours after any meal and 5 times per week on waking up.  Please bring blood sugar monitor to each visit.  Recommended blood sugar levels about 2 hours after meal is 140-180 and on waking up 90-130

## 2014-09-20 NOTE — Progress Notes (Signed)
Patient ID: Andrea Wallace, female   DOB: 01/14/1932, 81 y.o.   MRN: [id number]   Reason for Appointment: Diabetes follow-up   History of Present Illness   Diagnosis: Type 2 DIABETES MELITUS, date of diagnosis 1999  Previous history: She has been on insulin for several years to control her diabetes and usually requires large doses Has been on basal bolus regimen for the last few years, taking Lantus twice a day.  Her blood sugars tend to fluctuate significantly but her A1c is usually at a reasonable level She does not clearly benefit from GLP-1 drugs and had been taking Byetta to help with postprandial hyperglycemia, this has been stopped  Insulin regimen: Toujeo 60 qd, Humalog 35 am; 0-15 acl 40-45 acs        RECENT history She has taken 60 units Toujeo in the morning once a day and has not been adjusting her dose; previously was arbitrarily adjusting her Lantus when she was taking this twice a day She is also taking Humalog with meals but mostly at breakfast and suppertime unless blood sugar is low Her A1c has been consistently around 8%, last time higher at 8.5  Current blood sugar patterns and problems identified:  She is checking her blood sugars mostly at breakfast and bedtime  In the last week especially her blood sugars in the mornings have been consistently normal or low with the lowest reading 48 but she has not adjusted her Toujeo for this  Blood sugars are markedly increased after supper most of the time with average reading well over 200.  She thinks she is consistent with taking her Humalog right at mealtime and generally taking 40 units unless blood sugar is very high and she will take more.  However she says sometimes her husband has to remind her to take her Humalog.  She denies taking any Humalog at bedtime  She does not remember how much insulin she takes at breakfast time  She is usually not taking her insulin at lunchtime even though she is eating a half sandwich  and blood sugars are on the high side at times  Blood sugar readings by  :  PRE-MEAL Breakfast Lunch Dinner Bedtime Overall  Glucose range: 48-227  150-256   41-317   135-361    Mean/median:  122     221  168    She had been Given Victoza, Byetta and Invokana previously and this was stopped because of unclear benefit Her weight has been stable     Oral hypoglycemic drugs: Metformin ER, 500 mg twice a day       Side effects from medications: None  Proper timing of medications in relation to meals: Yes.         Monitors blood glucose:  2-3 times a day on average.    Glucometer: Freestyle          Meals: 3 meals per day.  Nutrisystem usually; at breakfast she is eating a  Peanut butter toast.  Protein bar at lunch, small portions at supper         Physical activity: exercise: some on the exercise bike           Dietician visit: Most recent: Years ago       Complications: are: Neuropathy, microalbuminuria     Wt Readings from Last 3 Encounters:  09/20/14 177 lb 9.6 oz (80.559 kg)  06/07/14 179 lb 12.8 oz (81.557 kg)  03/07/14 179 lb 12.8 oz (81.557 kg)  Lab Results  Component Value Date   HGBA1C 8.5* 06/07/2014   HGBA1C 8.1* 03/07/2014   HGBA1C 7.8* 12/05/2013   Lab Results  Component Value Date   MICROALBUR 119.9 Repeated and verified X2.* 03/07/2014   LDLCALC 32 12/05/2013   CREATININE 1.41* 06/07/2014       Medication List       This list is accurate as of: 09/20/14  5:18 PM.  Always use your most recent med list.               aspirin 81 MG tablet  Take 81 mg by mouth daily.     betamethasone dipropionate 0.05 % cream  Commonly known as:  DIPROLENE     CRESTOR 40 MG tablet  Generic drug:  rosuvastatin  Take 40 mg by mouth daily.     DULoxetine 60 MG capsule  Commonly known as:  CYMBALTA  60 mg.     fenofibrate 145 MG tablet  Commonly known as:  TRICOR  Take 1 tablet (145 mg total) by mouth every evening.     FLECTOR 1.3 % [place]  Generic drug:   diclofenac     gabapentin 600 MG tablet  Commonly known as:  NEURONTIN  TAKE ONE TABLET BY MOUTH THREE TIMES DAILY     glucose blood test strip  Commonly known as:  FREESTYLE LITE  Use as instructed to check blood sugars 3 times per day, dx code E11.49     HUMALOG 100 UNIT/ML injection  Generic drug:  insulin lispro  INJECT 40 UNITS SQ BEFORE BREAKFAST, 30 UNITS SQ BEFORE LUNCH AND 45 UNITS SQ BEFORE SUPPER     HYDROcodone-acetaminophen 10-325 MG per tablet  Commonly known as:  NORCO  Take 1 tablet by mouth every 6 (six) hours as needed for moderate pain.     Insulin Pen Needle 31G X 5 MM Misc  Commonly known as:  B-D UF III MINI PEN NEEDLES  Use 3 pen needles per day     Insulin Syringe-Needle U-100 30G X 5/16" 0.5 ML Misc  1 each by Does not apply route 4 (four) times daily.     RELION INSULIN SYR .3CC/29G 29G X 1/2" 0.3 ML Misc  Generic drug:  Insulin Syringe-Needle U-100     losartan 25 MG tablet  Commonly known as:  COZAAR  TAKE ONE TABLET BY MOUTH ONCE DAILY     metFORMIN 500 MG 24 hr tablet  Commonly known as:  GLUCOPHAGE-XR     montelukast 10 MG tablet  Commonly known as:  SINGULAIR  Take 10 mg by mouth at bedtime.     multivitamin with minerals Tabs tablet  Take 1 tablet by mouth daily.     neomycin-polymyxin b-dexamethasone 3.5-10000-0.1 Oint  Commonly known as:  MAXITROL     omeprazole 20 MG capsule  Commonly known as:  PRILOSEC  Take 20 mg by mouth daily as needed. For acid reflux     propranolol ER 120 MG 24 hr capsule  Commonly known as:  INDERAL LA  Take 120 mg by mouth daily.     TOUJEO SOLOSTAR 300 UNIT/ML Sopn  Generic drug:  Insulin Glargine  INJECT 60 UNITS SUBCUTANEOUSLY ONCE DAILY BEFORE BREAKFAST     traMADol 50 MG tablet  Commonly known as:  ULTRAM  Take 1 tablet (50 mg total) by mouth every 12 (twelve) hours as needed.     VITAMIN B-12 PO  Take 1 tablet by mouth daily.     VITAMIN  D PO  Take 1 tablet by mouth daily.         Allergies:  Allergies  Allergen Reactions  . Codeine Sulfate Other (See Comments)  . Etodolac Other (See Comments)  . Oxycodone-Acetaminophen Other (See Comments)  . Sulfa Antibiotics     Other reaction(s): RASH  . Sulfamethoxazole-Trimethoprim Other (See Comments)    Past Medical History  Diagnosis Date  . Anemia   . Arthritis   . Blood transfusion   . Asthma   . Diabetes mellitus   . Hyperlipidemia   . Neuromuscular disorder   . Generalized headaches   . Nasal congestion   . Cough   . Cancer     bilateral breast  . Allergy   . Complication of anesthesia     diff  waking up  . Hypertension     dr Ouida Sills    Past Surgical History  Procedure Laterality Date  . Total knee arthroplasty    . Shoulder arthroscopy      bilateral  . Breast surgery  06/2007    right mastectomy  . Breast surgery  2011    left  . Kidney stone surgery    . Back surgery    . Foot surgery      Family History  Problem Relation Age of Onset  . Osteoporosis Mother   . Heart disease Father   . Cancer Brother   . Heart disease Sister     Social History:  reports that she quit smoking about 30 years ago. She has never used smokeless tobacco. She reports that she drinks alcohol. She reports that she does not use illicit drugs.  Review of Systems:  Because of her having increased microalbumin she was started on losartan on her last visit and told to stop felodipine She is still taking this but nephrologist has reduced this to 12.5 mg, no renal functions available recently  Chronic kidney disease: Creatinine has been  upper normal, also followed periodically by nephrologist  Lab Results  Component Value Date   CREATININE 1.41* 06/07/2014    HYPERLIPIDEMIA: The lipid abnormality consists of elevated  triglycerides, has been treated with TriCor.  Lab Results  Component Value Date   CHOL 124 06/07/2014   HDL 33.30* 06/07/2014   LDLCALC 32 12/05/2013   LDLDIRECT 61.0 06/07/2014    TRIG 245.0* 06/07/2014   CHOLHDL 4 06/07/2014     She is still complaining of back pain and leg pains and is taking tramadol during the day mostly  She has chronic pains and paresthesia in her feet and lower legs, and takes Percocet once or twice a day, mostly at night. She is asking for refill today    Also on gabapentin 600 mg and Cymbalta  Last diabetic foot exam was in 02/2014 with the following findings Decreased absent monofilament sensation on her toes and distal plantar surfaces.  Pedal pulses normal on the left and absent on the right Deformities of left second and third toes present     Examination:   BP 116/68 mmHg  Pulse 75  Temp(Src) 98 F (36.7 C)  Resp 16  Ht 5\' 5"  (1.651 m)  Wt 177 lb 9.6 oz (80.559 kg)  BMI 29.55 kg/m2  SpO2 94%  Body mass index is 29.55 kg/(m^2).   No pedal edema   ASSESSMENT/ PLAN:   Diabetes type 2:  The patient's diabetes control is still inconsistent as discussed in history of present illness She is taking basal bolus insulin regimen  and generally requiring high doses especially at meals  Her blood sugar patterns are discussed above As discussed above she has started having low sugars in the mornings with current regimen of Toujeo 60 units in the morning Blood sugars are mostly high after supper at night despite her not eating large amount of carbohydrate and taking 40 units of Humalog Also sugars may be higher at lunch but she is not checking these often She thinks he maybe occasionally forgetting her  mealtime insulin because of memory issues but she does not think she is taking any Humalog at bedtime A1c has been higher recently  Recommendations: Reduce Toujeo by 8 units Increase Humalog at least at suppertime by 5 units More consistent monitoring at lunch and also consistent coverage of lunch carbohydrates To call if she has significant hypoglycemia  CKD/nephropathy: Creatinine has been high normal and will check this  again To have follow-up urine microalbumin   History of hypertension: She has low normal blood pressure reading again  NEUROPATHY: She continues to have chronic leg pains and also back pain taking chronic narcotic analgesics.  Also taking Cymbalta and gabapentin with some relief. Hydrocodone 10 mg prescription, 100 tablets given and she continues to get this from our office, is not getting it from other physicians Also given 60 tablets of Ultram to take as needed with one refill   MIXED hyperlipidemia: Tends to have persistently high triglyceride levels    Patient Instructions  Toujeo 52 u nits in am  HUMALOG 45 at supper Must take Humalog in am even if sugar normal  If eating a sandwich at lunch or starchy food take Humalog 10-15 U  Please check blood sugars at least half the time about 2 hours after any meal and 5 times per week on waking up.  Please bring blood sugar monitor to each visit.  Recommended blood sugar levels about 2 hours after meal is 140-180 and on waking up 90-130    Counseling time over 50% of today's 25 minute visit  Zaahir Pickney 09/20/2014, 5:18 PM

## 2014-10-05 ENCOUNTER — Ambulatory Visit
Admission: RE | Admit: 2014-10-05 | Discharge: 2014-10-05 | Disposition: A | Discharge status: Home / Self Care | Payer: Medicare Other | Source: Ambulatory Visit | Attending: Internal Medicine | Admitting: Internal Medicine

## 2014-10-05 DIAGNOSIS — Z1231 Encounter for screening mammogram for malignant neoplasm of breast: Secondary | ICD-10-CM | POA: Diagnosis not present

## 2014-10-05 HISTORY — DX: Malignant neoplasm of unspecified site of unspecified female breast: C50.919

## 2014-10-10 ENCOUNTER — Other Ambulatory Visit: Payer: Self-pay | Admitting: Internal Medicine

## 2014-10-10 DIAGNOSIS — N6489 Other specified disorders of breast: Secondary | ICD-10-CM

## 2014-10-10 DIAGNOSIS — R928 Other abnormal and inconclusive findings on diagnostic imaging of breast: Secondary | ICD-10-CM

## 2014-10-11 ENCOUNTER — Ambulatory Visit
Admission: RE | Admit: 2014-10-11 | Discharge: 2014-10-11 | Disposition: A | Discharge status: Home / Self Care | Payer: Medicare Other | Source: Ambulatory Visit | Attending: Internal Medicine | Admitting: Internal Medicine

## 2014-10-11 ENCOUNTER — Ambulatory Visit: Payer: Medicare Other

## 2014-10-11 DIAGNOSIS — R928 Other abnormal and inconclusive findings on diagnostic imaging of breast: Secondary | ICD-10-CM | POA: Diagnosis present

## 2014-10-11 DIAGNOSIS — R921 Mammographic calcification found on diagnostic imaging of breast: Secondary | ICD-10-CM | POA: Insufficient documentation

## 2014-10-11 DIAGNOSIS — N6489 Other specified disorders of breast: Secondary | ICD-10-CM

## 2014-10-13 ENCOUNTER — Encounter: Payer: Self-pay | Admitting: Emergency Medicine

## 2014-10-13 ENCOUNTER — Emergency Department
Admission: EM | Admit: 2014-10-13 | Discharge: 2014-10-13 | Disposition: A | Discharge status: Home / Self Care | Payer: Medicare Other | Attending: Emergency Medicine | Admitting: Emergency Medicine

## 2014-10-13 DIAGNOSIS — S52592A Other fractures of lower end of left radius, initial encounter for closed fracture: Secondary | ICD-10-CM | POA: Insufficient documentation

## 2014-10-13 DIAGNOSIS — X58XXXA Exposure to other specified factors, initial encounter: Secondary | ICD-10-CM | POA: Insufficient documentation

## 2014-10-13 DIAGNOSIS — Z7952 Long term (current) use of systemic steroids: Secondary | ICD-10-CM | POA: Insufficient documentation

## 2014-10-13 DIAGNOSIS — Z794 Long term (current) use of insulin: Secondary | ICD-10-CM | POA: Diagnosis not present

## 2014-10-13 DIAGNOSIS — Y9389 Activity, other specified: Secondary | ICD-10-CM | POA: Insufficient documentation

## 2014-10-13 DIAGNOSIS — Z79899 Other long term (current) drug therapy: Secondary | ICD-10-CM | POA: Insufficient documentation

## 2014-10-13 DIAGNOSIS — Z87891 Personal history of nicotine dependence: Secondary | ICD-10-CM | POA: Insufficient documentation

## 2014-10-13 DIAGNOSIS — S4992XA Unspecified injury of left shoulder and upper arm, initial encounter: Secondary | ICD-10-CM | POA: Diagnosis present

## 2014-10-13 DIAGNOSIS — Y998 Other external cause status: Secondary | ICD-10-CM | POA: Diagnosis not present

## 2014-10-13 DIAGNOSIS — Z7982 Long term (current) use of aspirin: Secondary | ICD-10-CM | POA: Diagnosis not present

## 2014-10-13 DIAGNOSIS — Y9289 Other specified places as the place of occurrence of the external cause: Secondary | ICD-10-CM | POA: Insufficient documentation

## 2014-10-13 DIAGNOSIS — S52502A Unspecified fracture of the lower end of left radius, initial encounter for closed fracture: Secondary | ICD-10-CM

## 2014-10-13 DIAGNOSIS — M7989 Other specified soft tissue disorders: Secondary | ICD-10-CM

## 2014-10-13 DIAGNOSIS — E119 Type 2 diabetes mellitus without complications: Secondary | ICD-10-CM | POA: Insufficient documentation

## 2014-10-13 MED ORDER — TRAMADOL HCL 50 MG PO TABS
50.0000 mg | ORAL_TABLET | Freq: Four times a day (QID) | ORAL | Status: DC
  Administered 2014-10-13: 50 mg via ORAL

## 2014-10-13 MED ORDER — TRAMADOL HCL 50 MG PO TABS: 50.0000 mg | ORAL_TABLET | Freq: Two times a day (BID) | ORAL | Status: DC | PRN

## 2014-10-13 MED ORDER — TRAMADOL HCL 50 MG PO TABS: 50.0000 mg | ORAL_TABLET | Freq: Four times a day (QID) | ORAL | Status: DC | PRN

## 2014-10-13 MED ORDER — TRAMADOL HCL 50 MG PO TABS
ORAL_TABLET | ORAL | Status: AC
  Administered 2014-10-13: 50 mg via ORAL
  Filled 2014-10-13: qty 1

## 2014-10-13 MED ORDER — TRAMADOL HCL 50 MG PO TABS
50.0000 mg | ORAL_TABLET | Freq: Four times a day (QID) | ORAL | Status: DC | PRN
Start: 2014-10-13 — End: 2014-12-18

## 2014-10-13 NOTE — Discharge Instructions (Signed)

## 2014-10-13 NOTE — ED Notes (Signed)
Patient to ED with c/o worsening left arm pain and swelling, reports breaking on Tuesday and being seen at [facility]. Patient reports swelling and increasing pain started yesterday. Supposed to go back to MD next Friday.

## 2014-10-13 NOTE — ED Provider Notes (Signed)
CSN: [id number]     Arrival date & time 10/13/14  1710 History   First MD Initiated Contact with Patient 10/13/14 1729     Chief Complaint  Patient presents with  . Arm Injury     (Consider location/radiation/quality/duration/timing/severity/associated sxs/prior Treatment) HPI 81 year old female presents today for complaint of left arm pain and swelling. On 10/09/2014, patient suffered a left distal radius fracture with displacement. She was initially seen at the walk-in clinic at [facility] clinic. She was then seen by Dr. Roland Rack and had successful reduction in the office. Patient was placed into a sugar tong splint. Overall swelling has improved. She initially had numbness and tingling but this has resolved. Her complaint today is that she has developed some ecchymosis along the metacarpophalangeal joints of the left hand. She is also having moderate pain, 8 out of 10. She was not given any pain medications by the walk-in clinic nor orthopedics. Patient has a sling, she is keeping the left upper extremity elevated. Shows a follow-up appointment next week.   Past Medical History  Diagnosis Date  . Anemia   . Arthritis   . Blood transfusion   . Asthma   . Diabetes mellitus   . Hyperlipidemia   . Neuromuscular disorder   . Generalized headaches   . Nasal congestion   . Cough   . Cancer     bilateral breast  . Allergy   . Complication of anesthesia     diff  waking up  . Hypertension     dr Ouida Sills  . Breast cancer 2011    Right   Past Surgical History  Procedure Laterality Date  . Total knee arthroplasty    . Shoulder arthroscopy      bilateral  . Breast surgery  06/2007    right mastectomy  . Breast surgery  2011    left  . Kidney stone surgery    . Back surgery    . Foot surgery    . Mastectomy Right 2011    with Radiaiton  . Reduction mammaplasty Left 2012   Family History  Problem Relation Age of Onset  . Osteoporosis Mother   . Heart disease Father   . Cancer  Brother   . Breast cancer Brother   . Heart disease Sister    History  Substance Use Topics  . Smoking status: Former Smoker    Quit date: 03/15/1984  . Smokeless tobacco: Never Used  . Alcohol Use: Yes     Comment: occ   OB History    No data available     Review of Systems  Constitutional: Negative for fever, chills, activity change and fatigue.  HENT: Negative for congestion, sinus pressure and sore throat.   Eyes: Negative for visual disturbance.  Respiratory: Negative for cough, chest tightness and shortness of breath.   Cardiovascular: Negative for chest pain and leg swelling.  Gastrointestinal: Negative for nausea, vomiting, abdominal pain and diarrhea.  Genitourinary: Negative for dysuria.  Musculoskeletal: Negative for arthralgias and gait problem.  Skin: Negative for rash.  Neurological: Negative for weakness, numbness and headaches.  Hematological: Negative for adenopathy.  Psychiatric/Behavioral: Negative for behavioral problems, confusion and agitation.      Allergies  Codeine sulfate; Etodolac; Oxycodone-acetaminophen; Sulfa antibiotics; and Sulfamethoxazole-trimethoprim  Home Medications   Prior to Admission medications   Medication Sig Start Date End Date Taking? Authorizing Provider  aspirin 81 MG tablet Take 81 mg by mouth daily.    Historical Provider, MD  betamethasone dipropionate (  DIPROLENE) 0.05 % cream  05/01/13   Historical Provider, MD  Cholecalciferol (VITAMIN D PO) Take 1 tablet by mouth daily.    Historical Provider, MD  CRESTOR 40 MG tablet Take 40 mg by mouth daily.  02/17/11   Historical Provider, MD  Cyanocobalamin (VITAMIN B-12 PO) Take 1 tablet by mouth daily.    Historical Provider, MD  DULoxetine (CYMBALTA) 60 MG capsule 60 mg. 11/11/12   Historical Provider, MD  fenofibrate (TRICOR) 145 MG tablet Take 1 tablet (145 mg total) by mouth every evening. 12/07/13   Elayne Snare, MD  FLECTOR 1.3 % [facility]  01/24/13   Historical Provider, MD   gabapentin (NEURONTIN) 600 MG tablet TAKE ONE TABLET BY MOUTH THREE TIMES DAILY 06/18/14   Elayne Snare, MD  glucose blood (FREESTYLE LITE) test strip Use as instructed to check blood sugars 3 times per day, dx code E11.49 03/05/14   Elayne Snare, MD  HUMALOG 100 UNIT/ML injection INJECT 40 UNITS SQ BEFORE BREAKFAST, 30 UNITS SQ BEFORE LUNCH AND 45 UNITS SQ BEFORE SUPPER 07/25/14   Elayne Snare, MD  HYDROcodone-acetaminophen (NORCO) 10-325 MG per tablet Take 1 tablet by mouth every 6 (six) hours as needed for moderate pain. 09/20/14   Elayne Snare, MD  Insulin Pen Needle (B-D UF III MINI PEN NEEDLES) 31G X 5 MM MISC Use 3 pen needles per day 05/02/13   Elayne Snare, MD  Insulin Syringe-Needle U-100 30G X 5/16" 0.5 ML MISC 1 each by Does not apply route 4 (four) times daily. 12/26/12   Elayne Snare, MD  losartan (COZAAR) 25 MG tablet TAKE ONE TABLET BY MOUTH ONCE DAILY Patient taking differently: TAKE ONE HALF TABLET BY MOUTH ONCE DAILY 07/03/14   Elayne Snare, MD  metFORMIN (GLUCOPHAGE-XR) 500 MG 24 hr tablet  10/10/12   Historical Provider, MD  montelukast (SINGULAIR) 10 MG tablet Take 10 mg by mouth at bedtime.  03/02/11   Historical Provider, MD  Multiple Vitamin (MULTIVITAMIN WITH MINERALS) TABS Take 1 tablet by mouth daily.    Historical Provider, MD  neomycin-polymyxin b-dexamethasone (MAXITROL) 3.5-10000-0.1 OINT  02/21/13   Historical Provider, MD  omeprazole (PRILOSEC) 20 MG capsule Take 20 mg by mouth daily as needed. For acid reflux    Historical Provider, MD  propranolol (INDERAL LA) 120 MG 24 hr capsule Take 120 mg by mouth daily.  01/15/11   Historical Provider, MD  [place] .3CC/29G 29G X 1/2" 0.3 ML MISC  12/26/12   Historical Provider, MD  TOUJEO SOLOSTAR 300 UNIT/ML SOPN INJECT 60 UNITS SUBCUTANEOUSLY ONCE DAILY BEFORE BREAKFAST 09/19/14   Elayne Snare, MD  traMADol (ULTRAM) 50 MG tablet Take 1 tablet (50 mg total) by mouth every 6 (six) hours as needed. 10/13/14   Duanne Guess, PA-C  traMADol  (ULTRAM) 50 MG tablet Take 1 tablet (50 mg total) by mouth every 12 (twelve) hours as needed. 10/13/14   Duanne Guess, PA-C   BP 181/65 mmHg  Pulse 88  Temp(Src) 97.7 F (36.5 C) (Oral)  Resp 20  Ht 5\' 5"  (1.651 m)  Wt 170 lb (77.111 kg)  BMI 28.29 kg/m2  SpO2 100% Physical Exam  Constitutional: She is oriented to person, place, and time. She appears well-developed and well-nourished.  HENT:  Head: Normocephalic and atraumatic.  Eyes: Pupils are equal, round, and reactive to light.  Neck: Normal range of motion. Neck supple.  Cardiovascular: Normal rate.   Pulmonary/Chest: No respiratory distress.  Musculoskeletal:  Examination of the left upper  extremity shows patient is in a well fitted sugar tong splint. Ace wrap is intact. Patient has full range of motion of the digits. There is mild swelling throughout the digits with ecchymosis along the metacarpophalangeal joints. Patient has 2+ Refill. No neurological deficits noted.  Neurological: She is alert and oriented to person, place, and time.  Skin: Skin is warm and dry. No rash noted.    ED Course  Procedures (including critical care time) Labs Review Labs Reviewed - No data to display  Imaging Review No results found.  Results reviewed from 10/09/2014 at [place] [place] [facility]: Patient underwent successful reduction of distal radial metaphysis fracture with good radial height and radial inclination.   EKG Interpretation None      MDM   Final diagnoses:  Distal radius fracture, left, closed, initial encounter  Swelling of left hand    Patient suffered left distal radius fracture on 10/09/2014, she underwent successful closed reduction on5/17/2016 by Dr. Roland Rack. The patient was placed into a sugar tong splint. She comes in today for her painful left wrist. She was not given any pain medications. She is neurovascular intact left upper shoulder. She is not experiencing any traumas. She'll follow-up with Dr.  Roland Rack next week. Return to the ER for any numbness tingling severe pain urgent changes in her health    Duanne Guess, PA-C 10/13/14 [place], PA-C 10/13/14 1803  Orbie Pyo, MD 10/14/14 [phone]

## 2014-10-24 ENCOUNTER — Encounter: Payer: Self-pay | Admitting: Endocrinology

## 2014-12-13 ENCOUNTER — Ambulatory Visit: Payer: Medicare Other | Attending: Internal Medicine | Admitting: Physical Therapy

## 2014-12-13 ENCOUNTER — Encounter: Payer: Self-pay | Admitting: Physical Therapy

## 2014-12-13 DIAGNOSIS — R262 Difficulty in walking, not elsewhere classified: Secondary | ICD-10-CM

## 2014-12-13 DIAGNOSIS — E874 Mixed disorder of acid-base balance: Secondary | ICD-10-CM

## 2014-12-13 DIAGNOSIS — S52502S Unspecified fracture of the lower end of left radius, sequela: Secondary | ICD-10-CM | POA: Diagnosis present

## 2014-12-13 DIAGNOSIS — R531 Weakness: Secondary | ICD-10-CM

## 2014-12-13 DIAGNOSIS — M6281 Muscle weakness (generalized): Secondary | ICD-10-CM | POA: Insufficient documentation

## 2014-12-13 DIAGNOSIS — R279 Unspecified lack of coordination: Secondary | ICD-10-CM | POA: Insufficient documentation

## 2014-12-13 DIAGNOSIS — X58XXXS Exposure to other specified factors, sequela: Secondary | ICD-10-CM | POA: Insufficient documentation

## 2014-12-13 NOTE — Therapy (Signed)
[place] [facility] SERVICES [address] [place], [place], [address] Phone: [phone]   Fax:  [phone]  Physical Therapy Treatment  Patient Details  Name: Andrea Wallace MRN: [id number] Date of Birth: 1932/04/26 Referring Provider:  Kirk Ruths, MD  Encounter Date: 12/13/2014      PT End of Session - 12/13/14 1135    Visit Number 1   Number of Visits 17   Date for PT Re-Evaluation 02/07/15   Equipment Utilized During Treatment Gait belt   Activity Tolerance Patient tolerated treatment well   Behavior During Therapy [facility] for tasks assessed/performed      Past Medical History  Diagnosis Date  . Anemia   . Arthritis   . Blood transfusion   . Asthma   . Diabetes mellitus   . Hyperlipidemia   . Neuromuscular disorder   . Generalized headaches   . Nasal congestion   . Cough   . Cancer     bilateral breast  . Allergy   . Complication of anesthesia     diff  waking up  . Hypertension     dr Ouida Sills  . Breast cancer 2011    Right    Past Surgical History  Procedure Laterality Date  . Total knee arthroplasty    . Shoulder arthroscopy      bilateral  . Breast surgery  06/2007    right mastectomy  . Breast surgery  2011    left  . Kidney stone surgery    . Back surgery    . Foot surgery    . Mastectomy Right 2011    with Radiaiton  . Reduction mammaplasty Left 2012    There were no vitals filed for this visit.  Visit Diagnosis:  Weakness  Difficulty walking  Mixed acid base balance disorder      Subjective Assessment - 12/13/14 1120    Subjective Patient is having falls and has unsteady gait. She lives with her husband and ambulates with a SPC   Currently in Pain? Yes   Pain Score 8    Pain Location Back            [facility] PT Assessment - 12/13/14 1122    Assessment   Medical Diagnosis falls   Onset Date/Surgical Date 11/27/14   Hand Dominance Right   Next MD Visit December 24, 2014   Prior Therapy no   Balance Screen   Has the patient fallen in the past 6 months Yes   How many times? 4   Has the patient had a decrease in activity level because of a fear of falling?  Yes   Is the patient reluctant to leave their home because of a fear of falling?  Yes   [place] Private residence   Living Arrangements Spouse/significant other   Available Help at Discharge Family   Type of [place] One level   Prior Function   Level of Independence Independent       EVALUATION: Strength BLE 3+/5 hip abd, 3/5/ ext  ROM BLE WFL Outcome measures: TUG 9.27 sec 5 x stand 16.70 sec 10 MW .90 m/sec 6 MW test 650 feet Gait : ambulates with spc and short distances with antalgic gait  and limp Transfers with spc and independelty    Therapeutic exericse: Leg press with 90lbs knee flex and ankle heel raises alternating x 3 sets of 15 Standing in parallel bars  squats x 10 4 way with YTB x 10 Sit to stand x 10                             PT Long Term Goals - Jan 09, 2015 1138    PT LONG TERM GOAL #1   Title Patient will be independent in home exercise program to improve strength/mobility for better functional independence with ADLs 02/07/15   Status New   PT LONG TERM GOAL #2   Title Patient (> 78 years old) will complete five times sit to stand test in < 15 seconds indicating an increased LE strength and improved balance  02/07/15   Status New   PT LONG TERM GOAL #3   Title Patient will increase 10 meter walk test to >1.67m/s as to improve gait speed for better community ambulation and to reduce fall risk  02/07/15   Status New   PT LONG TERM GOAL #4   Title Patient will be able to improve 6 MW test by 80 points to show improved functinal ability and ability to ambulate intermediate distances.   02/07/15   Status New               Plan - January 09, 2015 1136    Clinical Impression Statement Pateint is 81 year old  female who presents with multiple falls in the past 6 months. She has weakness in BLE hips and decresed dynamic standing balance with falls risk She has decresed outcome measures for giat speed and increased falls risk.    Pt will benefit from skilled therapeutic intervention in order to improve on the following deficits Abnormal gait;Decreased activity tolerance;Decreased strength;Pain;Decreased balance;Decreased mobility;Difficulty walking;Decreased range of motion;Decreased safety awareness   Rehab Potential Fair   PT Frequency 2x / week   PT Duration 8 weeks   PT Treatment/Interventions Balance training;Therapeutic exercise;Therapeutic activities;Stair training;Gait training;Neuromuscular re-education;Manual techniques   PT Next Visit Plan Balance training and strengthening   PT Home Exercise Plan HEP squats and 4 way hip   Consulted and Agree with Plan of Care Patient          G-Codes - 09-Jan-2015 1147    Functional Assessment Tool Used TUG , 10 MW, 6 MW, 5 x sit to stand   Functional Limitation Mobility: Walking and moving around   Mobility: Walking and Moving Around Current Status [phone]) At least 40 percent but less than 60 percent impaired, limited or restricted   Mobility: Walking and Moving Around Goal Status [phone]) At least 20 percent but less than 40 percent impaired, limited or restricted      Problem List Patient Active Problem List   Diagnosis Date Noted  . Chronic kidney disease, stage III (moderate) 05/03/2013  . Type II diabetes mellitus, uncontrolled 01/11/2013  . Other and unspecified hyperlipidemia 12/28/2012  . Multinodular goiter 11/01/2012  . Lumbar stenosis with neurogenic claudication 06/29/2012  . Anemia associated with acute blood loss 06/29/2012  . Breast cancer 03/16/2011    [name] 01-09-2015, 11:47 AM  [place] [facility] SERVICES [address] [place], [place], [address] Phone: [phone]    Fax:  [phone]

## 2014-12-18 ENCOUNTER — Encounter: Payer: Self-pay | Admitting: Endocrinology

## 2014-12-18 ENCOUNTER — Ambulatory Visit: Payer: Medicare Other | Admitting: Physical Therapy

## 2014-12-18 ENCOUNTER — Ambulatory Visit (INDEPENDENT_AMBULATORY_CARE_PROVIDER_SITE_OTHER): Payer: Medicare Other | Admitting: Endocrinology

## 2014-12-18 VITALS — BP 110/50 | HR 67 | Temp 98.1°F | Resp 16 | Ht 65.0 in | Wt 169.6 lb

## 2014-12-18 DIAGNOSIS — M8580 Other specified disorders of bone density and structure, unspecified site: Secondary | ICD-10-CM | POA: Insufficient documentation

## 2014-12-18 DIAGNOSIS — N182 Chronic kidney disease, stage 2 (mild): Secondary | ICD-10-CM | POA: Diagnosis not present

## 2014-12-18 DIAGNOSIS — E042 Nontoxic multinodular goiter: Secondary | ICD-10-CM | POA: Diagnosis not present

## 2014-12-18 DIAGNOSIS — E1165 Type 2 diabetes mellitus with hyperglycemia: Secondary | ICD-10-CM

## 2014-12-18 DIAGNOSIS — S52502S Unspecified fracture of the lower end of left radius, sequela: Secondary | ICD-10-CM | POA: Diagnosis not present

## 2014-12-18 DIAGNOSIS — IMO0002 Reserved for concepts with insufficient information to code with codable children: Secondary | ICD-10-CM

## 2014-12-18 DIAGNOSIS — R262 Difficulty in walking, not elsewhere classified: Secondary | ICD-10-CM

## 2014-12-18 DIAGNOSIS — R531 Weakness: Secondary | ICD-10-CM

## 2014-12-18 LAB — BASIC METABOLIC PANEL
BUN: 36 mg/dL — AB (ref 6–23)
CO2: 28 mEq/L (ref 19–32)
Calcium: 9.6 mg/dL (ref 8.4–10.5)
Chloride: 103 mEq/L (ref 96–112)
Creatinine, Ser: 1.19 mg/dL (ref 0.40–1.20)
GFR: 46.28 mL/min — AB (ref >60.00)
Glucose, Bld: 72 mg/dL (ref 70–99)
POTASSIUM: 4.1 meq/L (ref 3.5–5.1)
Sodium: 141 mEq/L (ref 135–145)

## 2014-12-18 LAB — HEMOGLOBIN A1C: HEMOGLOBIN A1C: 7.9 % — AB (ref 4.6–6.5)

## 2014-12-18 MED ORDER — TRAMADOL HCL 50 MG PO TABS: 50.0000 mg | ORAL_TABLET | Freq: Two times a day (BID) | ORAL | Status: DC | PRN

## 2014-12-18 MED ORDER — HYDROCODONE-ACETAMINOPHEN 10-325 MG PO TABS: 1.0000 | ORAL_TABLET | Freq: Two times a day (BID) | ORAL | Status: DC | PRN

## 2014-12-18 NOTE — Progress Notes (Signed)
Quick Note:  Please let patient know that the kidney function result is normal and she needs to take metformin twice a day  ______

## 2014-12-18 NOTE — Therapy (Signed)
[place] [facility]IN [facility] SERVICES [address] [place], [place], [address] Phone: [phone]   Fax:  [phone]  Physical Therapy Treatment  Patient Details  Name: SEE OMALLEY MRN: [id number] Date of Birth: 12/06/1931 Referring Provider:  Kirk Ruths, MD  Encounter Date: 12/18/2014      PT End of Session - 12/18/14 1358    Visit Number 2   Number of Visits 17   Date for PT Re-Evaluation 02/07/15   PT Start Time [id number]   PT Stop Time 1342   PT Time Calculation (min) 39 min   Equipment Utilized During Treatment Gait belt   Activity Tolerance Patient limited by fatigue   Behavior During Therapy [facility] for tasks assessed/performed      Past Medical History  Diagnosis Date  . Anemia   . Arthritis   . Blood transfusion   . Asthma   . Diabetes mellitus   . Hyperlipidemia   . Neuromuscular disorder   . Generalized headaches   . Nasal congestion   . Cough   . Cancer     bilateral breast  . Allergy   . Complication of anesthesia     diff  waking up  . Hypertension     dr Ouida Sills  . Breast cancer 2011    Right    Past Surgical History  Procedure Laterality Date  . Total knee arthroplasty    . Shoulder arthroscopy      bilateral  . Breast surgery  06/2007    right mastectomy  . Breast surgery  2011    left  . Kidney stone surgery    . Back surgery    . Foot surgery    . Mastectomy Right 2011    with Radiaiton  . Reduction mammaplasty Left 2012    There were no vitals filed for this visit.  Visit Diagnosis:  Difficulty walking  Weakness      Subjective Assessment - 12/18/14 1346    Subjective Pt reports she has been doing her home exercises without problems.   Currently in Pain? Yes   Pain Score 7    Pain Location Back          Therapeutic Ex: Stdg mini squats 2x10 in // bars; stdg hip abd and hip extension SLRs in // bars each 2x10 B; ambulation in carpeted hallway with [place] approx 200 ft with one  seated rest break and SBA/CGA. Nustep L2 x 5 min.  NMR: Tandem stance and SLS in // bars without UE support; narrow BOS on blue foam pad with EO and EC; alt toe tapping on 4" step without UE support in // bars; A/P weightshifting and balancing on small tilt board in // bars without UE support.                       PT Education - 12/18/14 1356    Education provided Yes   Education Details Pt educated in how to safely perform tandem stance and single leg stance NMR ex's at home at kitchen sink   Person(s) Educated Patient   Methods Explanation;Demonstration;Tactile cues;Verbal cues   Comprehension Verbalized understanding;Returned demonstration             PT Long Term Goals - 12/13/14 1138    PT LONG TERM GOAL #1   Title Patient will be independent in home exercise program to improve strength/mobility for better functional independence with ADLs 02/07/15   Status New  PT LONG TERM GOAL #2   Title Patient (> 79 years old) will complete five times sit to stand test in < 15 seconds indicating an increased LE strength and improved balance  02/07/15   Status New   PT LONG TERM GOAL #3   Title Patient will increase 10 meter walk test to >1.27m/s as to improve gait speed for better community ambulation and to reduce fall risk  02/07/15   Status New   PT LONG TERM GOAL #4   Title Patient will be able to improve 6 MW test by 80 points to show improved functinal ability and ability to ambulate intermediate distances.   02/07/15   Status New               Plan - 12/18/14 1359    Clinical Impression Statement Pt tolerated NMR and LE strengthening ex's without problems today, but did have some fatigue during the activities and needed seated rest breaks.  She needs further gait training with [place] for safety, especially with turns.   PT Next Visit Plan Balance Trg; Gait Trg with [place]; strengthening ex's for LE's   PT Home Exercise Plan Standing hip ex's; tandem stance and SLS  at kitchen sink        Problem List Patient Active Problem List   Diagnosis Date Noted  . Chronic kidney disease, stage III (moderate) 05/03/2013  . Type II diabetes mellitus, uncontrolled 01/11/2013  . Other and unspecified hyperlipidemia 12/28/2012  . Multinodular goiter 11/01/2012  . Lumbar stenosis with neurogenic claudication 06/29/2012  . Anemia associated with acute blood loss 06/29/2012  . Breast cancer 03/16/2011    Tareva Leske, MPT 12/18/2014, 2:04 PM  [place] MAIN [facility] SERVICES [address] [place], [place], [address] Phone: [phone]   Fax:  [phone]

## 2014-12-18 NOTE — Patient Instructions (Addendum)
Change Metformin to am  Humalog 45 at least at supper  Reduce Toujeo to 52 if am sugar stays under 90-100  Chech some readings at noon

## 2014-12-18 NOTE — Progress Notes (Addendum)
Patient ID: Andrea Wallace, female   DOB: 26-May-1931, 81 y.o.   MRN: [id number]   Reason for Appointment: Diabetes follow-up   History of Present Illness   Diagnosis: Type 2 DIABETES MELITUS, date of diagnosis 1999  Previous history: She has been on insulin for several years to control her diabetes and usually requires large doses Has been on basal bolus regimen for the last few years, taking Lantus twice a day.  Her blood sugars tend to fluctuate significantly but her A1c is usually at a reasonable level She does not clearly benefit from GLP-1 drugs and had been taking Byetta to help with postprandial hyperglycemia, this has been stopped  She had been tried on Victoza, Byetta and Invokana previously and these were  stopped because of unclear benefit  Insulin regimen: Toujeo 55 qd, Humalog 30 am; 0-15 acl 40-45 acs        RECENT history She has taken Toujeo in the morning once a day and overall appears to have better fasting readings now She has not adjusted the dose much However tends to require relatively large doses of insulin at mealtimes without adequate control especially after supper Her A1c has been consistently around 8%  Current blood sugar patterns and problems identified:  She is again checking her blood sugars mostly at breakfast and bedtime  She has variable readings in the mornings and in the last week mostly higher except for today when it was 81; however no nocturnal hypoglycemia as before  She checks blood sugars sporadically in the afternoon and these are variable  Appears to have relatively high readings before supper time at times and this is without any food intake during the day or afternoon  Blood sugars are mostly higher after supper and averaging well over 200 despite her taking about 40 units of Humalog.  she may not be consistent with the timing of her evening insulin before supper and may sometimes take it after eating  She was advised to  reduce Toujeo to 50 to only what she is usually taking 61 now  Not clear why she has lost weight recently  Currently taking only 500 mg of metformin ER in the evening, dose has been reduced because of renal insufficiency  Blood sugar readings by  download of her FreeStyle monitor show the following:  Mean values apply above for all meters except median for One Touch  PRE-MEAL Fasting Lunch Dinner Bedtime Overall  Glucose range:  73-277   129   62-305   114-501   62-501   Mean/median:  140    204   265   204        Oral hypoglycemic drugs: Metformin ER, 500 mg 1 a day       Side effects from medications: None  Proper timing of medications in relation to meals: Yes.         Monitors blood glucose:  2-3 times a day on average.    Glucometer: Freestyle          Meals: 3 meals per day.  Nutrisystem usually; at breakfast she is eating a  Peanut butter toast.  Protein bar at lunch, small portions at supper         Physical activity: exercise: some on the exercise bike           Dietician visit: Most recent: Years ago       Complications: are: Neuropathy, microalbuminuria     Wt Readings from Last 3  Encounters:  12/18/14 169 lb 9.6 oz (76.93 kg)  10/13/14 170 lb (77.111 kg)  09/20/14 177 lb 9.6 oz (80.559 kg)     Lab Results  Component Value Date   HGBA1C 7.9* 12/18/2014   HGBA1C 7.9* 09/20/2014   HGBA1C 8.5* 06/07/2014   Lab Results  Component Value Date   MICROALBUR 87.2* 09/20/2014   LDLCALC 32 12/05/2013   CREATININE 1.19 12/18/2014       Medication List       This list is accurate as of: 12/18/14  9:16 PM.  Always use your most recent med list.               aspirin 81 MG tablet  Take 81 mg by mouth daily.     betamethasone dipropionate 0.05 % cream  Commonly known as:  DIPROLENE     CRESTOR 40 MG tablet  Generic drug:  rosuvastatin  Take 40 mg by mouth daily.     DULoxetine 60 MG capsule  Commonly known as:  CYMBALTA  60 mg.     fenofibrate 145 MG  tablet  Commonly known as:  TRICOR  Take 1 tablet (145 mg total) by mouth every evening.     FLECTOR 1.3 % Ptch  Generic drug:  diclofenac     gabapentin 600 MG tablet  Commonly known as:  NEURONTIN  TAKE ONE TABLET BY MOUTH THREE TIMES DAILY     glucose blood test strip  Commonly known as:  FREESTYLE LITE  Use as instructed to check blood sugars 3 times per day, dx code E11.49     HUMALOG 100 UNIT/ML injection  Generic drug:  insulin lispro  INJECT 40 UNITS SQ BEFORE BREAKFAST, 30 UNITS SQ BEFORE LUNCH AND 45 UNITS SQ BEFORE SUPPER     HYDROcodone-acetaminophen 10-325 MG per tablet  Commonly known as:  NORCO  Take 1 tablet by mouth 2 (two) times daily as needed for moderate pain.     Insulin Pen Needle 31G X 5 MM Misc  Commonly known as:  B-D UF III MINI PEN NEEDLES  Use 3 pen needles per day     Insulin Syringe-Needle U-100 30G X 5/16" 0.5 ML Misc  1 each by Does not apply route 4 (four) times daily.     RELION INSULIN SYR .3CC/29G 29G X 1/2" 0.3 ML Misc  Generic drug:  Insulin Syringe-Needle U-100     metFORMIN 500 MG 24 hr tablet  Commonly known as:  GLUCOPHAGE-XR     montelukast 10 MG tablet  Commonly known as:  SINGULAIR  Take 10 mg by mouth at bedtime.     multivitamin with minerals Tabs tablet  Take 1 tablet by mouth daily.     neomycin-polymyxin b-dexamethasone 3.5-10000-0.1 Oint  Commonly known as:  MAXITROL     omeprazole 20 MG capsule  Commonly known as:  PRILOSEC  Take 20 mg by mouth daily as needed. For acid reflux     propranolol ER 120 MG 24 hr capsule  Commonly known as:  INDERAL LA  Take 120 mg by mouth daily.     TOUJEO SOLOSTAR 300 UNIT/ML Sopn  Generic drug:  Insulin Glargine  INJECT 60 UNITS SUBCUTANEOUSLY ONCE DAILY BEFORE BREAKFAST     traMADol 50 MG tablet  Commonly known as:  ULTRAM  Take 1 tablet (50 mg total) by mouth every 12 (twelve) hours as needed.     VITAMIN B-12 PO  Take 1 tablet by mouth daily.  VITAMIN D PO    Take 1 tablet by mouth daily.        Allergies:  Allergies  Allergen Reactions  . Codeine Sulfate Other (See Comments)  . Etodolac Other (See Comments)  . Oxycodone-Acetaminophen Other (See Comments)  . Sulfa Antibiotics     Other reaction(s): RASH  . Sulfamethoxazole-Trimethoprim Other (See Comments)    Past Medical History  Diagnosis Date  . Anemia   . Arthritis   . Blood transfusion   . Asthma   . Diabetes mellitus   . Hyperlipidemia   . Neuromuscular disorder   . Generalized headaches   . Nasal congestion   . Cough   . Cancer     bilateral breast  . Allergy   . Complication of anesthesia     diff  waking up  . Hypertension     dr Ouida Sills  . Breast cancer 2011    Right    Past Surgical History  Procedure Laterality Date  . Total knee arthroplasty    . Shoulder arthroscopy      bilateral  . Breast surgery  06/2007    right mastectomy  . Breast surgery  2011    left  . Kidney stone surgery    . Back surgery    . Foot surgery    . Mastectomy Right 2011    with Radiaiton  . Reduction mammaplasty Left 2012    Family History  Problem Relation Age of Onset  . Osteoporosis Mother   . Heart disease Father   . Cancer Brother   . Breast cancer Brother   . Heart disease Sister     Social History:  reports that she quit smoking about 30 years ago. She has never used smokeless tobacco. She reports that she drinks alcohol. She reports that she does not use illicit drugs.  Review of Systems:  She has had various fractures including her arm recently and not clear if she has osteoporosis.  No recent bone density available.  She has been told to have osteopenia only   Chronic kidney disease: Creatinine has been  upper normal, also followed periodically by nephrologist Losartan was stopped in 4/16 because of high potassium and mild increase in creatinine as well as low normal blood pressure Blood pressure is again low normal today  Lab Results  Component  Value Date   CREATININE 1.19 12/18/2014    HYPERLIPIDEMIA: The lipid abnormality consists of elevated  triglycerides, has been treated with TriCor.  Lab Results  Component Value Date   CHOL 124 06/07/2014   HDL 33.30* 06/07/2014   LDLCALC 32 12/05/2013   LDLDIRECT 61.0 06/07/2014   TRIG 245.0* 06/07/2014   CHOLHDL 4 06/07/2014    She is still having back pain and leg pains and is taking tramadol during the day mostly in Percocet at bedtime now  She has chronic pains and paresthesia in her feet and lower legs She is asking for refill today    Also on gabapentin 600 mg and Cymbalta  Last diabetic foot exam was in 02/2014 with the following findings Decreased absent monofilament sensation on her toes and distal plantar surfaces.  Pedal pulses normal on the left and absent on the right Deformities of left second and third toes present  She has had a multinodular goiter, this has been euthyroid  Lab Results  Component Value Date   TSH 2.30 06/07/2014        Examination:   BP 110/50 mmHg  Pulse 67  Temp(Src) 98.1 F (36.7 C)  Resp 16  Ht 5\' 5"  (1.651 m)  Wt 169 lb 9.6 oz (76.93 kg)  BMI 28.22 kg/m2  SpO2 97%  Body mass index is 28.22 kg/(m^2).   No pedal edema Thyroid barely palpable on the right side on swallowing, left side not palpable  ASSESSMENT/ PLAN:   Diabetes type 2:  The patient's diabetes control is still difficult especially with postprandial hyperglycemia at night See history of present illness for detailed discussion of his current management, blood sugar patterns and problems identified Her fasting readings are averaging about 140 and relatively stable with using Toujeo once a day Not clear why her sugars were higher in the mornings in the last few days, possibly from relatively high readings late at night also Hyperglycemia: Occurring mostly after his evening meal despite taking relatively large doses She  maybe occasionally forgetting her   mealtime insulin because of memory issues Also not clear why she has periodic high readings before supper even without any food intake during the day  A1c has been around 8% more recently, will check again today  Recommendations: Reduce Toujeo by 2-3 units of fasting readings stay low normal Increase Humalog at least at suppertime by 5 units Check blood sugars more consistently in the afternoon and take 4-8 units Humalog at least for high readings More consistent monitoring at lunch and cover any foot intake with Humalog If renal function is improved will consider increasing her metformin for better control  CKD/nephropathy: Creatinine has been high normal and will check this again. May not be able to use losartan again since she had relatively low blood pressure with this and high potassium  NEUROPATHY: She continues to have chronic leg pains and also back pain taking chronic narcotic analgesics.   Also taking Cymbalta and gabapentin with some relief. Hydrocodone 10 mg prescription, 100 tablets given and she continues to get this from our office, is not getting it from other physicians Also given 30 tablets of Ultram to take as needed with 2 refills  Multinodular goiter: Clinically stable  OSTEOPOROSIS: She likely has osteoporosis because of history of multiple fractures, she will request bone density from PCP and consider specific treatment also   Patient Instructions  Change Metformin to am  Humalog 45 at least at supper  Reduce Toujeo to 52 if am sugar stays under 90-100  Chech some readings at noon   Counseling time on subjects discussed above is over 50% of today's 25 minute visit  Harjot Dibello 12/18/2014, 9:16 PM     Addendum: Creatinine normal, increase metformin to twice a day

## 2014-12-20 ENCOUNTER — Encounter: Payer: Self-pay | Admitting: Occupational Therapy

## 2014-12-20 ENCOUNTER — Ambulatory Visit: Payer: Medicare Other | Admitting: Occupational Therapy

## 2014-12-20 DIAGNOSIS — M25639 Stiffness of unspecified wrist, not elsewhere classified: Secondary | ICD-10-CM

## 2014-12-20 DIAGNOSIS — Z741 Need for assistance with personal care: Secondary | ICD-10-CM

## 2014-12-20 DIAGNOSIS — M6281 Muscle weakness (generalized): Secondary | ICD-10-CM

## 2014-12-20 DIAGNOSIS — R460 Very low level of personal hygiene: Secondary | ICD-10-CM

## 2014-12-20 DIAGNOSIS — M25532 Pain in left wrist: Secondary | ICD-10-CM

## 2014-12-20 DIAGNOSIS — S52502S Unspecified fracture of the lower end of left radius, sequela: Secondary | ICD-10-CM | POA: Diagnosis not present

## 2014-12-20 DIAGNOSIS — R279 Unspecified lack of coordination: Secondary | ICD-10-CM

## 2014-12-20 NOTE — Therapy (Signed)
[place] MAIN [facility] SERVICES [address] [place], [place], [address] Phone: [phone]   Fax:  [phone]  Occupational Therapy Evaluation  Patient Details  Name: Andrea Wallace MRN: [id number] Date of Birth: 06/20/1931 Referring Provider:  Corky Mull, MD  Encounter Date: 12/20/2014      OT End of Session - 12/20/14 1623    Visit Number 1   Number of Visits 16   Date for OT Re-Evaluation 02/14/15   Authorization Type Medicare G code 1   Authorization Time Period 10   OT Start Time 1000   OT Stop Time 1100   OT Time Calculation (min) 60 min   Activity Tolerance Patient tolerated treatment well   Behavior During Therapy [facility] for tasks assessed/performed      Past Medical History  Diagnosis Date  . Anemia   . Arthritis   . Blood transfusion   . Asthma   . Diabetes mellitus   . Hyperlipidemia   . Neuromuscular disorder   . Generalized headaches   . Nasal congestion   . Cough   . Cancer     bilateral breast  . Allergy   . Complication of anesthesia     diff  waking up  . Hypertension     dr Ouida Sills  . Breast cancer 2011    Right    Past Surgical History  Procedure Laterality Date  . Total knee arthroplasty    . Shoulder arthroscopy      bilateral  . Breast surgery  06/2007    right mastectomy  . Breast surgery  2011    left  . Kidney stone surgery    . Back surgery    . Foot surgery    . Mastectomy Right 2011    with Radiaiton  . Reduction mammaplasty Left 2012    There were no vitals filed for this visit.  Visit Diagnosis:  Muscle weakness (generalized) - Plan: Ot plan of care cert/re-cert  Lack of coordination - Plan: Ot plan of care cert/re-cert  Wrist pain, left - Plan: Ot plan of care cert/re-cert  Self-care deficit for dressing and grooming - Plan: Ot plan of care cert/re-cert  Decreased ROM of wrist - Plan: Ot plan of care cert/re-cert      Subjective Assessment - 12/20/14 1018    Subjective  Patient reports she broke her wrist/forearm in may of this year and still has pain and decreased use of the arm since that time.  She reports she did not have any surgical repair.  She reports falling at least 9 times in the last year.  She says she had a difficult time wearing the cast and it rubbed blisters on her hand.  She reports she cannot pick up items on the left without dropping them.  She denies any previous therapy for the wrist fracture but is getting PT currently to address her balance.    Pertinent History Patient suffered a left distal radius fracture/colles fracture on Oct 11, 2014.   She is now 10 weeks post fracture with no surgical intervention.   Patient Stated Goals Patient reports she wants to be able to pick up objects without dropping them, vacuum, take care of herself and do "normal" stuff.     Currently in Pain? Yes   Pain Score 5    Pain Location Wrist   Pain Orientation Left   Pain Descriptors / Indicators Sharp;Aching;Tingling   Pain Type Acute pain   Pain  Onset More than a month ago   Pain Frequency Intermittent   Aggravating Factors  picking up objects like a plate with food.     Pain Relieving Factors keeping the arm still   Effect of Pain on Daily Activities husband is having more tasks around the home however he is in poor health.   Multiple Pain Sites Yes   Pain Score 5   Pain Location Back   Pain Orientation Lower   Pain Descriptors / Indicators Aching;Throbbing;Tightness   Pain Type Chronic pain   Pain Onset More than a month ago   Pain Frequency Intermittent   Pain Relieving Factors has one chair at home and she is painfree however, any other times it hurts.    Effect of Pain on Daily Activities has limited all self care and IADL tasks.            [facility] OT Assessment - 12/20/14 1025    Assessment   Diagnosis left distal radius fracture   Prior Therapy none   Precautions   Precautions Fall   Balance Screen   Has the patient fallen  in the past 6 months Yes   How many times? 9   Has the patient had a decrease in activity level because of a fear of falling?  Yes   Is the patient reluctant to leave their home because of a fear of falling?  Yes   Home  Environment   Family/patient expects to be discharged to: Private residence   Living Arrangements Spouse/significant other   Type of [place] One level   Alternate Level Stairs - Number of Steps 2   Bathroom Shower/Tub Tub/Shower unit;Door   Animal nutritionist - 2 wheels;Cane - single point;Cane -quad;Bedside commode   Lives With Spouse   Prior Function   Level of Independence Independent  did have back pain and had difficulty with vacuuming.   Vocation Retired   ADL   Eating/Feeding Independent   Grooming Minimal assistance   Administrator, sports Independent   Lower Body Dressing Independent;Minimal assistance  with IT trainer Modified independent   IADL   Prior Level of Function Shopping independent   Prior Level of Function Light Housekeeping occasional assist   Prior Level of Function Meal Prep independent   Community Mobility Drives own vehicle   Medication Management Is responsible for taking medication in correct dosages at correct time   Prior Level of Function Financial Management husband has been responsible for this.   Mobility   Mobility Status Needs assist  using cane to assist   Written Expression   Dominant Hand Right   Cognition   Overall Cognitive Status Within Functional Limits for tasks assessed   Sensation   Light Touch Appears Intact   Stereognosis Appears Intact   Hot/Cold Appears Intact   Proprioception Appears Intact    Coordination   Gross Motor Movements are Fluid and Coordinated Yes   Fine Motor Movements are Fluid and Coordinated No   9 Hole Peg Test Right;Left   Right 9 Hole Peg Test 24   Left 9 Hole Peg Test 26   Edema   Edema Patient has mild edema in the left  forearm and wrist.   ROM / Strength   AROM / PROM / Strength AROM;Strength   AROM   Overall AROM  Deficits   Overall AROM Comments Patient demos right shoulder flexion to 114 degrees, left 125 degrees, ABD WFL, full elbow flexion/extension bilaterally, IR within functional limits for performing toilet hygiene.  Wrist flexion to 35 degrees, extension to 55 degrees.  Able to demo full fist but has arthritic changes present.  Full opposition of thumb to fingers bilaterally.  Supination to 70 degrees with pain at the wrist.     Strength   Overall Strength Deficits   Overall Strength Comments UE strength 3+/5 right shoulder, elbow 3/5, wrist NT due to fracture but is unable to hold her cup with coffee in it.    Hand Function   Right Hand Grip (lbs) 26   Right Hand Lateral Pinch 8 lbs   Right Hand 3 Point Pinch 6 lbs   Left Hand Grip (lbs) 6   Left Hand Lateral Pinch 4 lbs   Left 3 point pinch 4 lbs            Patient seen for AROM of the left wrist for flexion/extension, ulnar and radial deviation and forearm supination/pronation.  Educated on ROM for HEP.              OT Education - 12/20/14 1622    Education provided Yes   Education Details Role of OT, goals and ROM exercises for HEP   Person(s) Educated Patient   Methods Explanation;Demonstration;Verbal cues   Comprehension Verbal cues required;Returned demonstration;Verbalized understanding             OT Long Term Goals - 12/20/14 1633    OT LONG TERM GOAL #1   Title Patient will improve left UE strength by 5# to be able to hold and drink from her coffee cup in hand without spilling it.   Baseline unable    Time 8   Period Weeks   Status New   OT LONG TERM  GOAL #2   Title Patient will improve ROM and strength in her left hand to hold blow dryer and curling iron to complete her hair care safely and with modified independence.   Baseline difficult and has burned herself with the curling iron.   Time 8   Period Weeks   Status New   OT LONG TERM GOAL #3   Title Patient will improve supination of her left forearm by 20 degrees to hold her plate and carry to the table with pain level 2 or less.   Baseline unable   Time 8   Period Weeks   Status New   OT LONG TERM GOAL #4   Title Patient will improve strength in LUE to perform cleaning the bathroom with modified independence.    Baseline unable    Time 8   Period Weeks   Status New   OT LONG TERM GOAL #5   Title Patient will improve LUE strength by 1 mm grade to complete ironing tasks.    Baseline difficulty performing and decreased use of left UE in task.   Time 8   Period Weeks   Status New   Long Term Additional Goals   Additional Long Term Goals Yes   OT LONG TERM GOAL #6   Title Patient will tie shoes with modified independence.   Baseline now wearing slip ons since she is unable to tie shoes.   Time 8   Period  Weeks   Status New   OT LONG TERM GOAL #7   Title Patient to improve supination of her left forearm and strength to hold a book to read.    Baseline moderate difficulty.   Time 8   Period Weeks   Status New               Plan - 12/31/14 1624    Clinical Impression Statement Patient is a 81 year old female who suffered a left distal radius/colles fracture on Oct 11, 2014 and was placed into a cast.  No surgical intervention was performed.  She reports she did not tolerate the cast very well.  She was independent prior to fracture but had multiple falls over the last year.  Self care and IADL tasks were becoming increasingly more difficult and her spouse also has multiple health issues.  She now requires some assistance with self care tasks, is able to participate  minimally in homemaking tasks and has increased pain and decreased ROM and strength in her left hand which limits her ability to perform tasks.  She would benefit from skilled OT to maximize her safety and independence in self care and daily tasks to remain at home with her husband.     Pt will benefit from skilled therapeutic intervention in order to improve on the following deficits (Retired) Decreased activity tolerance;Decreased knowledge of use of DME;Decreased strength;Impaired flexibility;Decreased balance;Decreased cognition;Decreased range of motion;Increased edema;Pain;Decreased coordination;Impaired UE functional use   Rehab Potential Good   OT Frequency 2x / week   OT Duration 8 weeks   OT Treatment/Interventions Self-care/ADL training;Therapeutic exercise;Patient/family education;Neuromuscular education;Manual Therapy;Splinting;DME and/or AE instruction;Parrafin;Fluidtherapy;Moist Heat;Contrast Bath   OT Home Exercise Plan ROM for wrist flexion/extension, supination/pronation, ulnar and radial deviation.   Consulted and Agree with Plan of Care Patient          G-Codes - 12-31-14 1642    Functional Assessment Tool Used ROM measurements, strength testing, clinical judgment   Functional Limitation Self care   Self Care Current Status [id number]) At least 40 percent but less than 60 percent impaired, limited or restricted   Self Care Goal Status [id number]) At least 1 percent but less than 20 percent impaired, limited or restricted      Problem List Patient Active Problem List   Diagnosis Date Noted  . Osteopenia 12/18/2014  . Carotid arterial disease 01/06/2014  . Chronic kidney disease, stage III (moderate) 05/03/2013  . Type II diabetes mellitus, uncontrolled 01/11/2013  . Other and unspecified hyperlipidemia 12/28/2012  . Multinodular goiter 11/01/2012  . Lumbar stenosis with neurogenic claudication 06/29/2012  . Anemia associated with acute blood loss 06/29/2012  . Breast cancer  03/16/2011   Achilles Dunk, OTR/L, CLT Lovett,Amy December 31, 2014, 4:45 PM  [place] MAIN [facility] SERVICES [address] [place], [place], [address] Phone: [phone]   Fax:  [phone]

## 2014-12-25 ENCOUNTER — Ambulatory Visit: Payer: Medicare Other | Attending: Internal Medicine | Admitting: Physical Therapy

## 2014-12-25 ENCOUNTER — Ambulatory Visit: Payer: Medicare Other | Admitting: Occupational Therapy

## 2014-12-25 ENCOUNTER — Encounter: Payer: Self-pay | Admitting: Physical Therapy

## 2014-12-25 ENCOUNTER — Encounter: Payer: Self-pay | Admitting: Occupational Therapy

## 2014-12-25 DIAGNOSIS — M6281 Muscle weakness (generalized): Secondary | ICD-10-CM

## 2014-12-25 DIAGNOSIS — R460 Very low level of personal hygiene: Secondary | ICD-10-CM

## 2014-12-25 DIAGNOSIS — M25639 Stiffness of unspecified wrist, not elsewhere classified: Secondary | ICD-10-CM

## 2014-12-25 DIAGNOSIS — R531 Weakness: Secondary | ICD-10-CM | POA: Diagnosis present

## 2014-12-25 DIAGNOSIS — M25532 Pain in left wrist: Secondary | ICD-10-CM | POA: Insufficient documentation

## 2014-12-25 DIAGNOSIS — R279 Unspecified lack of coordination: Secondary | ICD-10-CM

## 2014-12-25 DIAGNOSIS — R262 Difficulty in walking, not elsewhere classified: Secondary | ICD-10-CM | POA: Diagnosis present

## 2014-12-25 DIAGNOSIS — Z741 Need for assistance with personal care: Secondary | ICD-10-CM

## 2014-12-25 DIAGNOSIS — R29898 Other symptoms and signs involving the musculoskeletal system: Secondary | ICD-10-CM | POA: Diagnosis present

## 2014-12-25 NOTE — Therapy (Signed)
[place] MAIN [facility] SERVICES [address] [place], [place], [address] Phone: [phone]   Fax:  [phone]  Physical Therapy Treatment  Patient Details  Name: Andrea Wallace MRN: [id number] Date of Birth: [birth year] Referring Provider:  Kirk Ruths, MD  Encounter Date: 12/25/2014      PT End of Session - 12/25/14 1014    Visit Number 3   Number of Visits 17   Date for PT Re-Evaluation 02/07/15   PT Start Time 1000   PT Stop Time 1045   PT Time Calculation (min) 45 min   Equipment Utilized During Treatment Gait belt   Activity Tolerance Patient limited by fatigue   Behavior During Therapy [facility] for tasks assessed/performed      Past Medical History  Diagnosis Date  . Anemia   . Arthritis   . Blood transfusion   . Asthma   . Diabetes mellitus   . Hyperlipidemia   . Neuromuscular disorder   . Generalized headaches   . Nasal congestion   . Cough   . Cancer     bilateral breast  . Allergy   . Complication of anesthesia     diff  waking up  . Hypertension     dr Ouida Sills  . Breast cancer 2011    Right    Past Surgical History  Procedure Laterality Date  . Total knee arthroplasty    . Shoulder arthroscopy      bilateral  . Breast surgery  06/2007    right mastectomy  . Breast surgery  2011    left  . Kidney stone surgery    . Back surgery    . Foot surgery    . Mastectomy Right 2011    with Radiaiton  . Reduction mammaplasty Left 2012    There were no vitals filed for this visit.  Visit Diagnosis:  Muscle weakness (generalized)  Lack of coordination      Subjective Assessment - 12/25/14 1011    Subjective Pt reports she has been doing her home exercises without problems.She thinks that one of the exercises last time aggravated her back but she does not know whick one.    Pain Score 8    Pain Location Back   Pain Onset More than a month ago   Pain Onset More than a month ago      Therapeutic  Ex: Stdg mini squats 2x10 in // bars; stdg hip abd and hip extension  SLRs in // bars each 2x10 B;  ambulation in carpeted hallway with [place] approx 200 ft with one seated rest break and SBA/CGA.  Nustep L2 x 5 min , with MH x 15 minutes for back pain that decreased from 8/10 to 2/10 Hooklying with GTB and TA hold x 20 x 3 Hooklying with TA hold and marching x 20 x 3   NMR: Tandem stance and SLS in // bars without UE support;  narrow BOS on blue foam pad with EO and EC;  alt toe tapping on 4" step without UE support in // bars;  A/P weightshifting and balancing on small tilt board in // bars without UE support                           PT Education - 12/25/14 1012    Education provided Yes   Education Details HEP for PT   Person(s) Educated Patient   Methods Explanation  Comprehension Verbalized understanding             PT Long Term Goals - 12/13/14 1138    PT LONG TERM GOAL #1   Title Patient will be independent in home exercise program to improve strength/mobility for better functional independence with ADLs 02/07/15   Status New   PT LONG TERM GOAL #2   Title Patient (81 years old) will complete five times sit to stand test in < 15 seconds indicating an increased LE strength and improved balance  02/07/15   Status New   PT LONG TERM GOAL #3   Title Patient will increase 10 meter walk test to >1.49m/s as to improve gait speed for better community ambulation and to reduce fall risk  02/07/15   Status New   PT LONG TERM GOAL #4   Title Patient will be able to improve 6 MW test by 80 points to show improved functinal ability and ability to ambulate intermediate distances.   02/07/15   Status New               Plan - 12/25/14 1018    Clinical Impression Statement Patient presents with 8/10 back pain and warms up with MH to low back and nu-step. She is able to perform therapeutic exercise and neuromuscular training with no increased pain but some  fatigue.   Pt will benefit from skilled therapeutic intervention in order to improve on the following deficits Abnormal gait;Decreased activity tolerance;Decreased strength;Pain;Decreased balance;Decreased mobility;Difficulty walking;Decreased range of motion;Decreased safety awareness   PT Frequency 2x / week   PT Treatment/Interventions Balance training;Therapeutic exercise;Therapeutic activities;Stair training;Gait training;Neuromuscular re-education;Manual techniques   PT Next Visit Plan Balance Trg; Gait Trg with [place]; strengthening ex's for LE's   PT Home Exercise Plan Standing hip ex's; tandem stance and SLS at kitchen sink   Consulted and Agree with Plan of Care Patient        Problem List Patient Active Problem List   Diagnosis Date Noted  . Osteopenia 12/18/2014  . Carotid arterial disease 01/06/2014  . Chronic kidney disease, stage III (moderate) 05/03/2013  . Type II diabetes mellitus, uncontrolled 01/11/2013  . Other and unspecified hyperlipidemia 12/28/2012  . Multinodular goiter 11/01/2012  . Lumbar stenosis with neurogenic claudication 06/29/2012  . Anemia associated with acute blood loss 06/29/2012  . Breast cancer 03/16/2011    [name] 12/25/2014, 10:21 AM  [place] MAIN [facility] SERVICES [address] [place], [place], [address] Phone: [phone]   Fax:  [phone]

## 2014-12-26 NOTE — Therapy (Signed)
[name] [facility] SERVICES [address] [place], [place], [address] Phone: [phone]   Fax:  [phone]  Occupational Therapy Treatment  Patient Details  Name: Andrea Wallace MRN: [id number] Date of Birth: 1931/07/29 Referring Provider:  Corky Mull, MD  Encounter Date: 12/25/2014      OT End of Session - 12/26/14 1756    Visit Number 2   Number of Visits 16   Date for OT Re-Evaluation 02/14/15   Authorization Type Medicare G code 2   Authorization Time Period 10   OT Start Time 1045   OT Stop Time [id number]   OT Time Calculation (min) 46 min   Activity Tolerance Patient tolerated treatment well   Behavior During Therapy [facility] for tasks assessed/performed      Past Medical History  Diagnosis Date  . Anemia   . Arthritis   . Blood transfusion   . Asthma   . Diabetes mellitus   . Hyperlipidemia   . Neuromuscular disorder   . Generalized headaches   . Nasal congestion   . Cough   . Cancer     bilateral breast  . Allergy   . Complication of anesthesia     diff  waking up  . Hypertension     dr Ouida Sills  . Breast cancer 2011    Right    Past Surgical History  Procedure Laterality Date  . Total knee arthroplasty    . Shoulder arthroscopy      bilateral  . Breast surgery  06/2007    right mastectomy  . Breast surgery  2011    left  . Kidney stone surgery    . Back surgery    . Foot surgery    . Mastectomy Right 2011    with Radiaiton  . Reduction mammaplasty Left 2012    There were no vitals filed for this visit.  Visit Diagnosis:  Muscle weakness (generalized)  Lack of coordination  Wrist pain, left  Self-care deficit for dressing and grooming  Decreased ROM of wrist      Subjective Assessment - 12/25/14 1048    Subjective  Patient reports her back has hurt for the last week, she feels a bit better today after PT.  Pain rated in back 4/10 and feels it is manageable, was an 8 or more earlier.  Wrist  pain about 6/10 at beginning of treatment.     Pertinent History Patient suffered a left distal radius fracture/colles fracture on Oct 11, 2014.   She is now 10 weeks post fracture with no surgical intervention.   Patient Stated Goals Patient reports she wants to be able to pick up objects without dropping them, vacuum, take care of herself and do "normal" stuff.     Currently in Pain? Yes   Pain Score 6    Pain Location Wrist   Pain Orientation Left   Pain Descriptors / Indicators Aching   Pain Type Acute pain   Pain Onset More than a month ago   Pain Frequency Intermittent   Multiple Pain Sites Yes   Pain Score 4   Pain Location Back   Pain Orientation Lower   Pain Descriptors / Indicators Aching;Throbbing;Tightness   Pain Type Chronic pain   Pain Onset More than a month ago   Pain Frequency Intermittent                      OT Treatments/Exercises (OP) - 12/26/14 0001  Fine Motor Coordination   Other Fine Motor Exercises Patient seen for fine motor coordination exercises with manipulation of small objects from table top with cues to move from finger tips to palm and back on the left.   Neurological Re-education Exercises   Other Exercises 1 Patient seen for moist heat for 5 minutes prior to therapy session to left wrist. AAROM of left wrist for wrist flexion/extension, ulnar and radial deviation followed by 1# weight in the left hand to complete the same exercises for strengthening. Patient performing light pinch skills, lateral, 3 point and 2 point pinch for 2 sets of 10 reps each. Manual stretches by therapist prior to ROM to increase ROM and decrease pain.Supination of left forearm for 10 reps for 2 sets each.                OT Education - 12/25/14 1051    Education provided Yes   Education Details HEP for left upper extremity   Person(s) Educated Patient   Methods Explanation;Demonstration;Verbal cues   Comprehension Verbal cues required;Returned  demonstration;Verbalized understanding             OT Long Term Goals - 12/20/14 1633    OT LONG TERM GOAL #1   Title Patient will improve left UE strength by 5# to be able to hold and drink from her coffee cup in hand without spilling it.   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   Baseline difficult and has burned herself with the curling iron.   Time 8   Period Weeks   Status New   OT LONG TERM GOAL #3   Title Patient will improve supination of her left forearm by 20 degrees to hold her plate and carry to the table with pain level 2 or less.   Baseline unable   Time 8   Period Weeks   Status New   OT LONG TERM GOAL #4   Title Patient will improve strength in LUE to perform cleaning the bathroom with modified independence.    Baseline unable    Time 8   Period Weeks   Status New   OT LONG TERM GOAL #5   Title Patient will improve LUE strength by 1 mm grade to complete ironing tasks.    Baseline difficulty performing and decreased use of left UE in task.   Time 8   Period Weeks   Status New   Long Term Additional Goals   Additional Long Term Goals Yes   OT LONG TERM GOAL #6   Title Patient will tie shoes with modified independence.   Baseline now wearing slip ons since she is unable to tie shoes.   Time 8   Period Weeks   Status New   OT LONG TERM GOAL #7   Title Patient to improve supination of her left forearm and strength to hold a book to read.    Baseline moderate difficulty.   Time 8   Period Weeks   Status New               Plan - 12/26/14 1757    Clinical Impression Statement Patient's pain decreased significantly after treatment this date.  She is able to perform exercises with  verbal cues and therapist demo of exercises.  Light strengthening to  left wrist and forearm to improve use in daily functional taskss.    Pt will benefit from skilled therapeutic intervention in order to improve on the following deficits (Retired) Decreased activity tolerance;Decreased knowledge of use of DME;Decreased strength;Impaired flexibility;Decreased balance;Decreased cognition;Decreased range of motion;Increased edema;Pain;Decreased coordination;Impaired UE functional use   Rehab Potential Good   OT Frequency 2x / week   OT Duration 8 weeks   OT Treatment/Interventions Self-care/ADL training;Therapeutic exercise;Patient/family education;Neuromuscular education;Manual Therapy;Splinting;DME and/or AE instruction;Parrafin;Fluidtherapy;Moist Heat;Contrast Bath   OT Home Exercise Plan ROM for wrist flexion/extension, supination/pronation, ulnar and radial deviation.   Consulted and Agree with Plan of Care Patient        Problem List Patient Active Problem List   Diagnosis Date Noted  . Osteopenia 12/18/2014  . Carotid arterial disease 01/06/2014  . Chronic kidney disease, stage III (moderate) 05/03/2013  . Type II diabetes mellitus, uncontrolled 01/11/2013  . Other and unspecified hyperlipidemia 12/28/2012  . Multinodular goiter 11/01/2012  . Lumbar stenosis with neurogenic claudication 06/29/2012  . Anemia associated with acute blood loss 06/29/2012  . Breast cancer 03/16/2011    Farouk Vivero 12/26/2014, 6:03 PM  [place] MAIN [facility] SERVICES [address] [place], [place], [address] Phone: [phone]   Fax:  [phone]

## 2014-12-27 ENCOUNTER — Encounter: Payer: Self-pay | Admitting: Occupational Therapy

## 2014-12-27 ENCOUNTER — Ambulatory Visit: Payer: Medicare Other | Admitting: Occupational Therapy

## 2014-12-27 ENCOUNTER — Ambulatory Visit: Payer: Medicare Other

## 2014-12-27 DIAGNOSIS — M6281 Muscle weakness (generalized): Secondary | ICD-10-CM | POA: Diagnosis not present

## 2014-12-27 DIAGNOSIS — R460 Very low level of personal hygiene: Secondary | ICD-10-CM

## 2014-12-27 DIAGNOSIS — R279 Unspecified lack of coordination: Secondary | ICD-10-CM

## 2014-12-27 DIAGNOSIS — Z741 Need for assistance with personal care: Secondary | ICD-10-CM

## 2014-12-27 DIAGNOSIS — M25639 Stiffness of unspecified wrist, not elsewhere classified: Secondary | ICD-10-CM

## 2014-12-27 DIAGNOSIS — M25532 Pain in left wrist: Secondary | ICD-10-CM

## 2014-12-27 NOTE — Therapy (Signed)
[place] MAIN [facility] SERVICES [address] [place], [place], [address] Phone: [phone]   Fax:  [phone]  Physical Therapy Treatment  Patient Details  Name: Andrea Wallace MRN: [id number] Date of Birth: 1931-11-29 Referring Provider:  Kirk Ruths, MD  Encounter Date: 12/27/2014      PT End of Session - 12/27/14 1157    Visit Number 4   Number of Visits 17   Date for PT Re-Evaluation 02/07/15   Authorization Type 4/10   PT Start Time 0846   PT Stop Time 0929   PT Time Calculation (min) 43 min   Equipment Utilized During Treatment Gait belt   Activity Tolerance Patient limited by fatigue   Behavior During Therapy [facility] for tasks assessed/performed      Past Medical History  Diagnosis Date  . Anemia   . Arthritis   . Blood transfusion   . Asthma   . Diabetes mellitus   . Hyperlipidemia   . Neuromuscular disorder   . Generalized headaches   . Nasal congestion   . Cough   . Cancer     bilateral breast  . Allergy   . Complication of anesthesia     diff  waking up  . Hypertension     dr Ouida Sills  . Breast cancer 2011    Right    Past Surgical History  Procedure Laterality Date  . Total knee arthroplasty    . Shoulder arthroscopy      bilateral  . Breast surgery  06/2007    right mastectomy  . Breast surgery  2011    left  . Kidney stone surgery    . Back surgery    . Foot surgery    . Mastectomy Right 2011    with Radiaiton  . Reduction mammaplasty Left 2012    There were no vitals filed for this visit.  Visit Diagnosis:  Muscle weakness (generalized)  Lack of coordination      Subjective Assessment - 12/27/14 1154    Subjective pt relates has been performing her HEP on a regular basis and has 5/10 back pain currently, with her biggest c/o of pain in her left wrist which will be worked on in [place].     Pain Score 5    Pain Location Back   Pain Orientation Distal;Left;Right   Pain Onset More than a  month ago   Pain Onset More than a month ago        Therapeutic Ex: Standing hip abduction/flexion/extension in //bars 2x10 each Nustep L2 x 10 min for functional activity tolerance and LE strength Hooklying with TA hold and marching x 20 x 3 Bridges x10 with emphasis on TA hold Sitting cervical retractions x10 Scapular retractions x10  Supine cervical retractions x10 Pt required verbal and tactile cues for proper technique, especially with cervical retractions    NMR: Anterior/ posterior and side to side weightshifting on airex 2x1 min each direction  Bilateral forward step and back 2x10 Bilateral side step and back 2x10 Pt required supervision for safety and cueing for proper exercise technique                           PT Education - 12/27/14 1156    Education provided Yes   Education Details reviewed HEP and instructed on proper technique   Person(s) Educated Patient   Methods Explanation;Demonstration   Comprehension Verbalized understanding;Returned demonstration  PT Long Term Goals - 12/13/14 1138    PT LONG TERM GOAL #1   Title Patient will be independent in home exercise program to improve strength/mobility for better functional independence with ADLs 02/07/15   Status New   PT LONG TERM GOAL #2   Title Patient (> 69 years old) will complete five times sit to stand test in < 15 seconds indicating an increased LE strength and improved balance  02/07/15   Status New   PT LONG TERM GOAL #3   Title Patient will increase 10 meter walk test to >1.5m/s as to improve gait speed for better community ambulation and to reduce fall risk  02/07/15   Status New   PT LONG TERM GOAL #4   Title Patient will be able to improve 6 MW test by 80 points to show improved functinal ability and ability to ambulate intermediate distances.   02/07/15   Status New               Plan - 12/27/14 1158    Clinical Impression Statement pt was able  to complete session with minimal increase in pain in her low back.  pt did not exeperience any LOB during session and required supervision-CGA for safety.  pt presents with forward head posture, rounded shouders, increased thoracic kyphosis and flexed neck.     Pt will benefit from skilled therapeutic intervention in order to improve on the following deficits Abnormal gait;Decreased activity tolerance;Decreased strength;Pain;Decreased balance;Decreased mobility;Difficulty walking;Decreased range of motion;Decreased safety awareness   PT Frequency 2x / week   PT Treatment/Interventions Balance training;Therapeutic exercise;Therapeutic activities;Stair training;Gait training;Neuromuscular re-education;Manual techniques   PT Next Visit Plan Balance Trg; Gait Trg with [place]; strengthening ex's for LE's   Consulted and Agree with Plan of Care Patient        Problem List Patient Active Problem List   Diagnosis Date Noted  . Osteopenia 12/18/2014  . Carotid arterial disease 01/06/2014  . Chronic kidney disease, stage III (moderate) 05/03/2013  . Type II diabetes mellitus, uncontrolled 01/11/2013  . Other and unspecified hyperlipidemia 12/28/2012  . Multinodular goiter 11/01/2012  . Lumbar stenosis with neurogenic claudication 06/29/2012  . Anemia associated with acute blood loss 06/29/2012  . Breast cancer 03/16/2011   Renford Dills, SPT This entire session was performed under direct supervision and direction of a licensed therapist/therapist assistant . I have personally read, edited and approve of the note as written. Gorden Harms. Tortorici, PT, DPT [phone]  Tortorici,Ashley 12/27/2014, 1:52 PM  [place] [facility] [place] MAIN [facility] SERVICES [address] [place], [place], [address] Phone: [phone]   Fax:  [phone]

## 2014-12-28 ENCOUNTER — Encounter: Payer: Self-pay | Admitting: Occupational Therapy

## 2014-12-28 NOTE — Therapy (Signed)
[place] MAIN [facility] SERVICES [address] [place], [place], [address] Phone: [phone]   Fax:  [phone]  Occupational Therapy Treatment  Patient Details  Name: Andrea Wallace MRN: [id number] Date of Birth: Oct 02, 1931 Referring Provider:  Corky Mull, MD  Encounter Date: 12/27/2014      OT End of Session - 12/27/14 1909    Visit Number 3   Number of Visits 16   Date for OT Re-Evaluation 02/14/15   Authorization Type Medicare G code 3   Authorization Time Period 10   OT Start Time 0930   OT Stop Time 1015   OT Time Calculation (min) 45 min   Activity Tolerance Patient tolerated treatment well   Behavior During Therapy [facility] for tasks assessed/performed      Past Medical History  Diagnosis Date  . Anemia   . Arthritis   . Blood transfusion   . Asthma   . Diabetes mellitus   . Hyperlipidemia   . Neuromuscular disorder   . Generalized headaches   . Nasal congestion   . Cough   . Cancer     bilateral breast  . Allergy   . Complication of anesthesia     diff  waking up  . Hypertension     dr Ouida Sills  . Breast cancer 2011    Right    Past Surgical History  Procedure Laterality Date  . Total knee arthroplasty    . Shoulder arthroscopy      bilateral  . Breast surgery  06/2007    right mastectomy  . Breast surgery  2011    left  . Kidney stone surgery    . Back surgery    . Foot surgery    . Mastectomy Right 2011    with Radiaiton  . Reduction mammaplasty Left 2012    There were no vitals filed for this visit.  Visit Diagnosis:  Muscle weakness (generalized)  Lack of coordination  Wrist pain, left  Self-care deficit for dressing and grooming  Decreased ROM of wrist                    OT Treatments/Exercises (OP) - 12/27/14 1900    Neurological Re-education Exercises   Other Exercises 1 Patient seen for moist heat for 5 minutes prior to therapy session to left wrist. AAROM of left wrist  for wrist flexion/extension, ulnar and radial deviation, supination/pronation. Patient performing light pinch skills, lateral, 3 point and 2 point pinch for 2 sets of 10 reps each. Manual stretches by therapist prior to ROM to increase ROM and decrease pain. Gross grasp and release patterns with cues.                OT Education - 12/27/14 1904    Education provided Yes   Education Details HEP, ROM, use of soft brace   Person(s) Educated Patient   Methods Explanation;Demonstration;Verbal cues   Comprehension Verbal cues required;Returned demonstration;Verbalized understanding             OT Long Term Goals - 12/20/14 1633    OT LONG TERM GOAL #1   Title Patient will improve left UE strength by 5# to be able to hold and drink from her coffee cup in hand without spilling it.   Baseline unable    Time 8   Period Weeks   Status New   OT LONG TERM GOAL #2   Title Patient will improve ROM and strength in  her left hand to hold blow dryer and curling iron to complete her hair care safely and with modified independence.   Baseline difficult and has burned herself with the curling iron.   Time 8   Period Weeks   Status New   OT LONG TERM GOAL #3   Title Patient will improve supination of her left forearm by 20 degrees to hold her plate and carry to the table with pain level 2 or less.   Baseline unable   Time 8   Period Weeks   Status New   OT LONG TERM GOAL #4   Title Patient will improve strength in LUE to perform cleaning the bathroom with modified independence.    Baseline unable    Time 8   Period Weeks   Status New   OT LONG TERM GOAL #5   Title Patient will improve LUE strength by 1 mm grade to complete ironing tasks.    Baseline difficulty performing and decreased use of left UE in task.   Time 8   Period Weeks   Status New   Long Term Additional Goals   Additional Long Term Goals Yes   OT LONG TERM GOAL #6   Title Patient will tie shoes with modified  independence.   Baseline now wearing slip ons since she is unable to tie shoes.   Time 8   Period Weeks   Status New   OT LONG TERM GOAL #7   Title Patient to improve supination of her left forearm and strength to hold a book to read.    Baseline moderate difficulty.   Time 8   Period Weeks   Status New               Plan - 12/27/14 1910    Clinical Impression Statement Patient reports increased pain at times when at home, denied any pain today at rest and rated pain as a 2/10 when walking in.  Patient responds well to moist heat prior to ROM, recommend patient be transferred to our [facility] location where there are additional modalities which may benefit patient as well as working with hand therapist .  Patient in agreement and has an appt next session there.     Pt will benefit from skilled therapeutic intervention in order to improve on the following deficits (Retired) Decreased activity tolerance;Decreased knowledge of use of DME;Decreased strength;Impaired flexibility;Decreased balance;Decreased cognition;Decreased range of motion;Increased edema;Pain;Decreased coordination;Impaired UE functional use   Rehab Potential Good   OT Frequency 2x / week   OT Duration 8 weeks   OT Treatment/Interventions Self-care/ADL training;Therapeutic exercise;Patient/family education;Neuromuscular education;Manual Therapy;Splinting;DME and/or AE instruction;Parrafin;Fluidtherapy;Moist Heat;Contrast Bath   OT Home Exercise Plan ROM for wrist flexion/extension, supination/pronation, ulnar and radial deviation.   Consulted and Agree with Plan of Care Patient        Problem List Patient Active Problem List   Diagnosis Date Noted  . Osteopenia 12/18/2014  . Carotid arterial disease 01/06/2014  . Chronic kidney disease, stage III (moderate) 05/03/2013  . Type II diabetes mellitus, uncontrolled 01/11/2013  . Other and unspecified hyperlipidemia 12/28/2012  . Multinodular goiter 11/01/2012  .  Lumbar stenosis with neurogenic claudication 06/29/2012  . Anemia associated with acute blood loss 06/29/2012  . Breast cancer 03/16/2011   Achilles Dunk, OTR/L, CLT Lovett,Amy 12/28/2014, 7:15 PM  [place] MAIN [facility] SERVICES [address] [place], [place], [address] Phone: [phone]   Fax:  [phone]

## 2015-01-01 ENCOUNTER — Ambulatory Visit: Payer: Medicare Other | Admitting: Physical Therapy

## 2015-01-01 ENCOUNTER — Encounter: Payer: Self-pay | Admitting: Physical Therapy

## 2015-01-01 ENCOUNTER — Encounter: Payer: Medicare Other | Admitting: Occupational Therapy

## 2015-01-01 ENCOUNTER — Ambulatory Visit: Payer: Medicare Other | Admitting: Occupational Therapy

## 2015-01-01 ENCOUNTER — Encounter: Payer: Medicare Other | Admitting: Physical Therapy

## 2015-01-01 DIAGNOSIS — M6281 Muscle weakness (generalized): Secondary | ICD-10-CM | POA: Diagnosis not present

## 2015-01-01 DIAGNOSIS — R262 Difficulty in walking, not elsewhere classified: Secondary | ICD-10-CM

## 2015-01-01 DIAGNOSIS — R460 Very low level of personal hygiene: Secondary | ICD-10-CM

## 2015-01-01 DIAGNOSIS — M25639 Stiffness of unspecified wrist, not elsewhere classified: Secondary | ICD-10-CM

## 2015-01-01 DIAGNOSIS — M25532 Pain in left wrist: Secondary | ICD-10-CM

## 2015-01-01 DIAGNOSIS — Z741 Need for assistance with personal care: Secondary | ICD-10-CM

## 2015-01-01 NOTE — Therapy (Signed)
[place] [facility] [address] [place], [address] Phone: [phone]   Fax:  [phone]  Occupational Therapy Treatment  Patient Details  Name: Andrea Wallace MRN: [id number] Date of Birth: April 25, 1932 Referring Provider:  Corky Mull, MD  Encounter Date: 01/01/2015      OT End of Session - 01/01/15 1450    Visit Number 4   Number of Visits 16   Date for OT Re-Evaluation 02/14/15   OT Start Time 1201   OT Stop Time 1250   OT Time Calculation (min) 49 min   Activity Tolerance Patient tolerated treatment well   Behavior During Therapy [facility] for tasks assessed/performed      Past Medical History  Diagnosis Date  . Anemia   . Arthritis   . Blood transfusion   . Asthma   . Diabetes mellitus   . Hyperlipidemia   . Neuromuscular disorder   . Generalized headaches   . Nasal congestion   . Cough   . Cancer     bilateral breast  . Allergy   . Complication of anesthesia     diff  waking up  . Hypertension     dr Ouida Sills  . Breast cancer 2011    Right    Past Surgical History  Procedure Laterality Date  . Total knee arthroplasty    . Shoulder arthroscopy      bilateral  . Breast surgery  06/2007    right mastectomy  . Breast surgery  2011    left  . Kidney stone surgery    . Back surgery    . Foot surgery    . Mastectomy Right 2011    with Radiaiton  . Reduction mammaplasty Left 2012    There were no vitals filed for this visit.  Visit Diagnosis:  Muscle weakness (generalized)  Wrist pain, left  Self-care deficit for dressing and grooming  Decreased ROM of wrist      Subjective Assessment - 01/01/15 1445    Subjective  My pain about 2/10 - not dropping things as much as just weak with gripping or lifting objects - I have to say I am lazy - like to read and watch tv - but II'll do what you tell me to do    Patient Stated Goals Patient reports she wants to be able to pick up objects  without dropping them, vacuum, take care of herself and do "normal" stuff.     Currently in Pain? Yes   Pain Score 2    Pain Location Wrist   Pain Descriptors / Indicators Aching   Pain Type Chronic pain   Pain Onset More than a month ago   Pain Frequency Intermittent            [facility] OT Assessment - 01/01/15 0001    AROM   Left Forearm Pronation 90 Degrees   Left Forearm Supination 90 Degrees   Right Wrist Extension 68 Degrees   Right Wrist Flexion 65 Degrees   Left Wrist Extension 68 Degrees   Left Wrist Flexion 47 Degrees   Left Wrist Radial Deviation 33 Degrees   Left Wrist Ulnar Deviation 20 Degrees   Strength   Right Hand Grip (lbs) 28   Right Hand Lateral Pinch 6.5 lbs   Right Hand 3 Point Pinch 6 lbs   Left Hand Grip (lbs) 12   Left Hand Lateral Pinch 4 lbs   Left Hand 3 Point Pinch  5 lbs                  OT Treatments/Exercises (OP) - 01/01/15 0001    Wrist Exercises   Other wrist exercises Measurements was taken for wrist nad grip strength- because of transfer to OT here at  clinic - see flowsheet    Other wrist exercises PROM for wrist flexion , hammer for UD /RD/ wrist ext/flexion/ sup/pro - 10 reps - and teal putty for grip and 3 point grip    LUE Fluidotherapy   Number Minutes Fluidotherapy 10 Minutes   LUE Fluidotherapy Location Hand;Wrist   Comments AROM of wrist in all planes                 OT Education - 01/01/15 1449    Education provided Yes   Education Details HEP    Person(s) Educated Patient   Methods Explanation;Demonstration;Tactile cues;Handout;Verbal cues   Comprehension Verbalized understanding;Returned demonstration;Verbal cues required;Tactile cues required             OT Long Term Goals - 12/20/14 1633    OT LONG TERM GOAL #1   Title Patient will improve left UE strength by 5# to be able to hold and drink from her coffee cup in hand without spilling it.   Baseline unable    Time 8   Period Weeks    Status New   OT LONG TERM GOAL #2   Title Patient will improve ROM and strength in her left hand to hold blow dryer and curling iron to complete her hair care safely and with modified independence.   Baseline difficult and has burned herself with the curling iron.   Time 8   Period Weeks   Status New   OT LONG TERM GOAL #3   Title Patient will improve supination of her left forearm by 20 degrees to hold her plate and carry to the table with pain level 2 or less.   Baseline unable   Time 8   Period Weeks   Status New   OT LONG TERM GOAL #4   Title Patient will improve strength in LUE to perform cleaning the bathroom with modified independence.    Baseline unable    Time 8   Period Weeks   Status New   OT LONG TERM GOAL #5   Title Patient will improve LUE strength by 1 mm grade to complete ironing tasks.    Baseline difficulty performing and decreased use of left UE in task.   Time 8   Period Weeks   Status New   Long Term Additional Goals   Additional Long Term Goals Yes   OT LONG TERM GOAL #6   Title Patient will tie shoes with modified independence.   Baseline now wearing slip ons since she is unable to tie shoes.   Time 8   Period Weeks   Status New   OT LONG TERM GOAL #7   Title Patient to improve supination of her left forearm and strength to hold a book to read.    Baseline moderate difficulty.   Time 8   Period Weeks   Status New               Plan - 01/01/15 1451    Clinical Impression Statement Pt showed some decrease wrist flexion and grip compare to R - did had wrist fracture in past to R - pt had some pain with wrist extention and sup -but after fluido  pt was able to tolerate hammer for 1 lbs for strenthening good and teal putty HEP - pt to do  2 x day in pain free range only slight pull    Pt will benefit from skilled therapeutic intervention in order to improve on the following deficits (Retired) Decreased activity tolerance;Decreased knowledge of use  of DME;Decreased strength;Impaired flexibility;Decreased balance;Decreased cognition;Decreased range of motion;Increased edema;Pain;Decreased coordination;Impaired UE functional use   Rehab Potential Good   OT Frequency 2x / week   OT Duration 4 weeks   OT Treatment/Interventions Self-care/ADL training;Fluidtherapy;Parrafin;Therapeutic exercise;DME and/or AE instruction;Passive range of motion;Manual Therapy;Splinting;Patient/family education   Plan Assess HEP and pain    OT Home Exercise Plan See pt instruction   Consulted and Agree with Plan of Care Patient        Problem List Patient Active Problem List   Diagnosis Date Noted  . Osteopenia 12/18/2014  . Carotid arterial disease 01/06/2014  . Chronic kidney disease, stage III (moderate) 05/03/2013  . Type II diabetes mellitus, uncontrolled 01/11/2013  . Other and unspecified hyperlipidemia 12/28/2012  . Multinodular goiter 11/01/2012  . Lumbar stenosis with neurogenic claudication 06/29/2012  . Anemia associated with acute blood loss 06/29/2012  . Breast cancer 03/16/2011    Rosalyn Gess OTR/L,CLT 01/01/2015, 2:58 PM  [place] [facility] [address], [place], [address] Phone: [phone]   Fax:  [phone]

## 2015-01-01 NOTE — Therapy (Signed)
[facility] [address] [place], [address] Phone: [phone]   Fax:  [phone]  Physical Therapy Treatment  Patient Details  Name: Andrea Wallace MRN: [id number] Date of Birth: [birth year] Referring Provider:  Kirk Ruths, MD  Encounter Date: 01/01/2015      PT End of Session - 01/01/15 1200    Visit Number 5   Number of Visits 17   Date for PT Re-Evaluation 02/07/15   Authorization Type 5   Authorization Time Period 10   PT Start Time 1118   PT Stop Time 1200   PT Time Calculation (min) 42 min   Activity Tolerance Patient tolerated treatment well   Behavior During Therapy [facility] for tasks assessed/performed      Past Medical History  Diagnosis Date  . Anemia   . Arthritis   . Blood transfusion   . Asthma   . Diabetes mellitus   . Hyperlipidemia   . Neuromuscular disorder   . Generalized headaches   . Nasal congestion   . Cough   . Cancer     bilateral breast  . Allergy   . Complication of anesthesia     diff  waking up  . Hypertension     dr Ouida Sills  . Breast cancer 2011    Right    Past Surgical History  Procedure Laterality Date  . Total knee arthroplasty    . Shoulder arthroscopy      bilateral  . Breast surgery  06/2007    right mastectomy  . Breast surgery  2011    left  . Kidney stone surgery    . Back surgery    . Foot surgery    . Mastectomy Right 2011    with Radiaiton  . Reduction mammaplasty Left 2012    There were no vitals filed for this visit.  Visit Diagnosis:  Muscle weakness (generalized)  Difficulty walking      Subjective Assessment - 01/01/15 1120    Subjective Patient reports she feels she is coming to physical therapy for strengtheining her legs to help with walking and to help prevent her from falling. She feels her right leg is weaker than left and she is aware of her poor posture with head hanging down when she stands up. She is not  exercising much at home because she does not have a good bed to lie down on and her back hurts. She reports she has a rollator at home to use but it is too heavy to put in her car.    Currently in Pain? Yes   Pain Score 5    Pain Location Back   Pain Orientation Lower   Pain Descriptors / Indicators Aching   Pain Type Chronic pain   Pain Onset More than a month ago   Pain Frequency Intermittent      Objective:   Posture: moderate to severe forward head posture with standing/walking: using single point cane on arrival to clinic Strength: right LE hip abduction, ER and extension decreased as compared to left LE       [facility] Adult PT Treatment/Exercise - 01/01/15 1351    Exercises   Exercises Other Exercises   Other Exercises  Instructed patient in sitting exercises: ball stabilization with glute sets x 10 reps, stabilization with resistive band (green) double and single leg x 15 reps each with verbal cuing, fball rolling under her foot each one x 1 min., postural  exercises at wall with pilllow behind head: raise arms out to side x 10 reps, modified home exercises for standing hip extension and abduction to short arc and tap floor or wall with standing with chair in front for support      Patient response to treatment: Improved technique with all exercises with verbal cues and demonstration, written home program given with instruction and review to be sure patient understood exercises          PT Education - 01/01/15 1200    Education provided Yes   Education Details Instructed in home exercises for posture at wall, sitting hip stabilization exercises using ball and resistive band   Person(s) Educated Patient   Methods Explanation;Demonstration;Verbal cues;Handout   Comprehension Verbalized understanding;Returned demonstration;Verbal cues required             PT Long Term Goals - 12/13/14 1138    PT LONG TERM GOAL #1   Title Patient will be independent in home exercise  program to improve strength/mobility for better functional independence with ADLs 02/07/15   Status New   PT LONG TERM GOAL #2   Title Patient (> 79 years old) will complete five times sit to stand test in < 15 seconds indicating an increased LE strength and improved balance  02/07/15   Status New   PT LONG TERM GOAL #3   Title Patient will increase 10 meter walk test to >1.79m/s as to improve gait speed for better community ambulation and to reduce fall risk  02/07/15   Status New   PT LONG TERM GOAL #4   Title Patient will be able to improve 6 MW test by 80 points to show improved functinal ability and ability to ambulate intermediate distances.   02/07/15   Status New               Plan - 01/01/15 1210    Clinical Impression Statement Patient demonstrated improved technique with exercises in sitting with verbal cuing and assistance. She did have some increased lower back symptoms with ball between knees and required verbal modification to decrease this. She has poor posture awareness with moderate to severe forward head posture and increased thoracic kyphosis. She should continue to improve with strength of core and LE's with additioanl physical atherapy intervention in order to be able to transition to independent home program.    Pt will benefit from skilled therapeutic intervention in order to improve on the following deficits Pain;Decreased strength;Postural dysfunction;Difficulty walking;Decreased range of motion   Rehab Potential Fair   PT Frequency 2x / week   PT Duration 8 weeks   PT Treatment/Interventions Balance training;Therapeutic exercise;Therapeutic activities;Stair training;Gait training;Neuromuscular re-education;Manual techniques;Patient/family education   PT Next Visit Plan Ther. ex. for core control, LE control/strengthening, posture correction/standing, walking activities        Problem List Patient Active Problem List   Diagnosis Date Noted  . Osteopenia  12/18/2014  . Carotid arterial disease 01/06/2014  . Chronic kidney disease, stage III (moderate) 05/03/2013  . Type II diabetes mellitus, uncontrolled 01/11/2013  . Other and unspecified hyperlipidemia 12/28/2012  . Multinodular goiter 11/01/2012  . Lumbar stenosis with neurogenic claudication 06/29/2012  . Anemia associated with acute blood loss 06/29/2012  . Breast cancer 03/16/2011    Jomarie Longs PT 01/01/2015, 2:03 PM  [facility] [address], [place], [address] Phone: [phone]   Fax:  [phone]

## 2015-01-01 NOTE — Patient Instructions (Signed)
Wrist flexion stretch   Hammer for wrist in all planes - close to head hold - over armrest- except sup/pro elbow to side  10 reps each   Teal putty for grip and 3 point grip   2 x day

## 2015-01-03 ENCOUNTER — Encounter: Payer: Medicare Other | Admitting: Occupational Therapy

## 2015-01-03 ENCOUNTER — Ambulatory Visit: Payer: Medicare Other | Admitting: Occupational Therapy

## 2015-01-03 DIAGNOSIS — M6281 Muscle weakness (generalized): Secondary | ICD-10-CM | POA: Diagnosis not present

## 2015-01-03 DIAGNOSIS — M25639 Stiffness of unspecified wrist, not elsewhere classified: Secondary | ICD-10-CM

## 2015-01-03 DIAGNOSIS — R460 Very low level of personal hygiene: Secondary | ICD-10-CM

## 2015-01-03 DIAGNOSIS — M25532 Pain in left wrist: Secondary | ICD-10-CM

## 2015-01-03 DIAGNOSIS — Z741 Need for assistance with personal care: Secondary | ICD-10-CM

## 2015-01-03 NOTE — Therapy (Signed)
[facility] [address], [place], [address] Phone: [phone]   Fax:  [phone]  Occupational Therapy Treatment  Patient Details  Name: Andrea Wallace MRN: [id number] Date of Birth: May 01, 1932 Referring Provider:  Corky Mull, MD  Encounter Date: 01/03/2015      OT End of Session - 01/03/15 1717    Visit Number 5   Number of Visits 16   Date for OT Re-Evaluation 02/14/15   OT Start Time 1403   OT Stop Time 1445   OT Time Calculation (min) 42 min   Activity Tolerance Patient tolerated treatment well   Behavior During Therapy [facility] for tasks assessed/performed      Past Medical History  Diagnosis Date  . Anemia   . Arthritis   . Blood transfusion   . Asthma   . Diabetes mellitus   . Hyperlipidemia   . Neuromuscular disorder   . Generalized headaches   . Nasal congestion   . Cough   . Cancer     bilateral breast  . Allergy   . Complication of anesthesia     diff  waking up  . Hypertension     dr Ouida Sills  . Breast cancer 2011    Right    Past Surgical History  Procedure Laterality Date  . Total knee arthroplasty    . Shoulder arthroscopy      bilateral  . Breast surgery  06/2007    right mastectomy  . Breast surgery  2011    left  . Kidney stone surgery    . Back surgery    . Foot surgery    . Mastectomy Right 2011    with Radiaiton  . Reduction mammaplasty Left 2012    There were no vitals filed for this visit.  Visit Diagnosis:  Wrist pain, left  Self-care deficit for dressing and grooming  Decreased ROM of wrist  Muscle weakness (generalized)      Subjective Assessment - 01/03/15 1404    Subjective  Pain better today -about 2/10 - but yesterday was bad - did not do my exercises as much yesterday - put my splint on with the metal bar   Patient Stated Goals Patient reports she wants to be able to pick up objects without dropping them, vacuum, take care of herself  and do "normal" stuff.     Currently in Pain? Yes   Pain Score 2    Pain Location Wrist   Pain Orientation Left   Pain Descriptors / Indicators Aching   Pain Type Chronic pain   Pain Onset More than a month ago   Pain Frequency Intermittent                      OT Treatments/Exercises (OP) - 01/03/15 0001    ADLs   ADL Comments Discuss to built up handles when grasping weight or broom/vacuum  to enlarge grip because of arthritis    Wrist Exercises   Other wrist exercises Teal putty for grip, 3 point grip    Other wrist exercises Wrist flexion PROM , hammer for wrist sup/pro. RD and UD, wrist flexion/ext    LUE Fluidotherapy   Number Minutes Fluidotherapy 10 Minutes   LUE Fluidotherapy Location Hand;Wrist   Comments AROM at [facility] to decrease pain and increase ROM    Splinting   Splinting Neoprene Benik splint to use with vacuum or task that cause pain - not  metal splint anymore - ed on donning and wearing                 OT Education - 01/03/15 1717    Education provided Yes   Education Details HEP   Person(s) Educated Patient   Methods Explanation;Demonstration;Tactile cues;Verbal cues   Comprehension Verbalized understanding;Returned demonstration;Verbal cues required;Tactile cues required             OT Long Term Goals - 12/20/14 1633    OT LONG TERM GOAL #1   Title Patient will improve left UE strength by 5# to be able to hold and drink from her coffee cup in hand without spilling it.   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   Baseline difficult and has burned herself with the curling iron.   Time 8   Period Weeks   Status New   OT LONG TERM GOAL #3   Title Patient will improve supination of her left forearm by 20 degrees to hold her plate  and carry to the table with pain level 2 or less.   Baseline unable   Time 8   Period Weeks   Status New   OT LONG TERM GOAL #4   Title Patient will improve strength in LUE to perform cleaning the bathroom with modified independence.    Baseline unable    Time 8   Period Weeks   Status New   OT LONG TERM GOAL #5   Title Patient will improve LUE strength by 1 mm grade to complete ironing tasks.    Baseline difficulty performing and decreased use of left UE in task.   Time 8   Period Weeks   Status New   Long Term Additional Goals   Additional Long Term Goals Yes   OT LONG TERM GOAL #6   Title Patient will tie shoes with modified independence.   Baseline now wearing slip ons since she is unable to tie shoes.   Time 8   Period Weeks   Status New   OT LONG TERM GOAL #7   Title Patient to improve supination of her left forearm and strength to hold a book to read.    Baseline moderate difficulty.   Time 8   Period Weeks   Status New               Plan - 01/03/15 1719    Clinical Impression Statement Pt needed some min A to perform HEP correct- report had increase pain yesteray but today 2/10 andat end of session no pain  - pt to cont with HEP 2 x day for ROM and strenght    Pt will benefit from skilled therapeutic intervention in order to improve on the following deficits (Retired) Decreased activity tolerance;Decreased knowledge of use of DME;Decreased strength;Impaired flexibility;Decreased balance;Decreased cognition;Decreased range of motion;Increased edema;Pain;Decreased coordination;Impaired UE functional use   Rehab Potential Good   OT Frequency 2x / week   OT Duration 4 weeks   OT Treatment/Interventions Self-care/ADL training;Fluidtherapy;Parrafin;Therapeutic exercise;DME and/or AE instruction;Passive range of motion;Manual Therapy;Splinting;Patient/family education   Plan Assess HEP and grip /wrist flexion - pain    OT Home Exercise Plan See pt instruction    Consulted and Agree with Plan  of Care Patient        Problem List Patient Active Problem List   Diagnosis Date Noted  . Osteopenia 12/18/2014  . Carotid arterial disease 01/06/2014  . Chronic kidney disease, stage III (moderate) 05/03/2013  . Type II diabetes mellitus, uncontrolled 01/11/2013  . Other and unspecified hyperlipidemia 12/28/2012  . Multinodular goiter 11/01/2012  . Lumbar stenosis with neurogenic claudication 06/29/2012  . Anemia associated with acute blood loss 06/29/2012  . Breast cancer 03/16/2011    Rosalyn Gess OTR/L,CLT 01/03/2015, 5:22 PM  [facility] [address], [place], [address] Phone: [phone]   Fax:  [phone]

## 2015-01-03 NOTE — Patient Instructions (Addendum)
Reviewed HEP again - pt needed min A and min V/c - slight pull stretch for wrist flexion   Hammer to use - built up handle with washcloth

## 2015-01-05 ENCOUNTER — Other Ambulatory Visit: Payer: Self-pay | Admitting: Endocrinology

## 2015-01-07 ENCOUNTER — Telehealth: Payer: Self-pay | Admitting: Endocrinology

## 2015-01-07 NOTE — Telephone Encounter (Signed)
[facility] noted dated 01/05/15 at 1:48 PM Chief Complaint Prescription Refill or Medication Request (non symptomatic) Initial Comment Caller states needs rx refilled, going to run out Today ---Pt states she needs a refill on Toujeo Solo Star Insulin pen, 60u sq q am. Has not called the [facility] prior to today for the needed refill. Seen by MD 1.5 wks ago. Denies new/worsening s/s at this time and denies need for triage. States she is calling for the med only. Allergies: [facility]: [facility], [place], [id number] ---Advised will call in refill on MM to last her untill Monday, then will need to call the [facility] during reg bus hrs on Mon for the remainder of the refills needed. Verb understanding.

## 2015-01-07 NOTE — Telephone Encounter (Signed)
rx sent

## 2015-01-08 ENCOUNTER — Encounter: Payer: Medicare Other | Admitting: Physical Therapy

## 2015-01-08 ENCOUNTER — Other Ambulatory Visit: Payer: Self-pay | Admitting: Endocrinology

## 2015-01-08 ENCOUNTER — Ambulatory Visit: Payer: Medicare Other | Admitting: Occupational Therapy

## 2015-01-08 ENCOUNTER — Encounter: Payer: Medicare Other | Admitting: Occupational Therapy

## 2015-01-08 ENCOUNTER — Encounter: Payer: Self-pay | Admitting: Occupational Therapy

## 2015-01-08 DIAGNOSIS — M6281 Muscle weakness (generalized): Secondary | ICD-10-CM

## 2015-01-08 DIAGNOSIS — R460 Very low level of personal hygiene: Secondary | ICD-10-CM

## 2015-01-08 DIAGNOSIS — M25532 Pain in left wrist: Secondary | ICD-10-CM

## 2015-01-08 DIAGNOSIS — R279 Unspecified lack of coordination: Secondary | ICD-10-CM

## 2015-01-08 DIAGNOSIS — M25639 Stiffness of unspecified wrist, not elsewhere classified: Secondary | ICD-10-CM

## 2015-01-08 DIAGNOSIS — Z741 Need for assistance with personal care: Secondary | ICD-10-CM

## 2015-01-08 MED ORDER — GLUCOSE BLOOD VI STRP: ORAL_STRIP | Status: DC

## 2015-01-09 ENCOUNTER — Encounter: Payer: Medicare Other | Admitting: Occupational Therapy

## 2015-01-09 ENCOUNTER — Encounter: Payer: Medicare Other | Admitting: Physical Therapy

## 2015-01-09 NOTE — Therapy (Signed)
[place] [facility] [address] [place], [place], [address] Phone: [phone]   Fax:  [phone]  Occupational Therapy Treatment  Patient Details  Name: Andrea Wallace MRN: [id number] Date of Birth: [birth year] Referring Provider:  Corky Mull, MD  Encounter Date: 01/08/2015      OT End of Session - 01/08/15 1848    Visit Number 6   Number of Visits 16   Authorization Type Medicare G code 6   Authorization Time Period 10   OT Start Time 1008   OT Stop Time 1055   OT Time Calculation (min) 47 min   Activity Tolerance Patient tolerated treatment well   Behavior During Therapy [facility] for tasks assessed/performed      Past Medical History  Diagnosis Date  . Anemia   . Arthritis   . Blood transfusion   . Asthma   . Diabetes mellitus   . Hyperlipidemia   . Neuromuscular disorder   . Generalized headaches   . Nasal congestion   . Cough   . Cancer     bilateral breast  . Allergy   . Complication of anesthesia     diff  waking up  . Hypertension     dr Ouida Sills  . Breast cancer 2011    Right    Past Surgical History  Procedure Laterality Date  . Total knee arthroplasty    . Shoulder arthroscopy      bilateral  . Breast surgery  06/2007    right mastectomy  . Breast surgery  2011    left  . Kidney stone surgery    . Back surgery    . Foot surgery    . Mastectomy Right 2011    with Radiaiton  . Reduction mammaplasty Left 2012    There were no vitals filed for this visit.  Visit Diagnosis:  Wrist pain, left  Self-care deficit for dressing and grooming  Decreased ROM of wrist  Muscle weakness (generalized)  Lack of coordination                    OT Treatments/Exercises (OP) - 01/08/15 1853    Wrist Exercises   Other wrist exercises Patient seen for AROM for wrist flexion/ext, ulnar/radial deviation and supination pronation with cues followed by strengthening in all these motions with  1# weight over wedge.  Grasp and release of ball pegs with focus on wrist extension with cues.  Ball squeeze with wrist in extension at the top of the motion.  Yellow and red resistive pinch pins to place on elevated surface to work on strength of the hand as well as multidirectional reach and UE strength.     LUE Fluidotherapy   LUE Fluidotherapy Location Hand;Wrist   Splinting   Splinting Neoprene Benik splint to use with vacuum or task that cause pain - not metal splint anymore - ed on donning and wearing                 OT Education - 01/08/15 1848    Education provided Yes   Education Details HEp, positioning   Person(s) Educated Patient   Methods Explanation;Demonstration;Verbal cues   Comprehension Verbalized understanding;Returned demonstration;Verbal cues required             OT Long Term Goals - 12/20/14 1633    OT LONG TERM GOAL #1   Title Patient will improve left UE strength by 5# to be able to hold and  drink from her coffee cup in hand without spilling it.   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   Baseline difficult and has burned herself with the curling iron.   Time 8   Period Weeks   Status New   OT LONG TERM GOAL #3   Title Patient will improve supination of her left forearm by 20 degrees to hold her plate and carry to the table with pain level 2 or less.   Baseline unable   Time 8   Period Weeks   Status New   OT LONG TERM GOAL #4   Title Patient will improve strength in LUE to perform cleaning the bathroom with modified independence.    Baseline unable    Time 8   Period Weeks   Status New   OT LONG TERM GOAL #5   Title Patient will improve LUE strength by 1 mm grade to complete ironing tasks.    Baseline difficulty performing and decreased  use of left UE in task.   Time 8   Period Weeks   Status New   Long Term Additional Goals   Additional Long Term Goals Yes   OT LONG TERM GOAL #6   Title Patient will tie shoes with modified independence.   Baseline now wearing slip ons since she is unable to tie shoes.   Time 8   Period Weeks   Status New   OT LONG TERM GOAL #7   Title Patient to improve supination of her left forearm and strength to hold a book to read.    Baseline moderate difficulty.   Time 8   Period Weeks   Status New               Plan - 01/08/15 1849    Clinical Impression Statement Patient preferred to come back to the main clinic for both PT and OT this week.  She denied pain at the beginning of the session and reported occasional shooting pain with specific movements usually with supination.  Patient performs exercises without difficulty when distracted.  Continue to encourage use of hand during functional tasks at home.    Rehab Potential Good   OT Frequency 2x / week   OT Duration 4 weeks   OT Treatment/Interventions Self-care/ADL training;Fluidtherapy;Parrafin;Therapeutic exercise;DME and/or AE instruction;Passive range of motion;Manual Therapy;Splinting;Patient/family education   Consulted and Agree with Plan of Care Patient        Problem List Patient Active Problem List   Diagnosis Date Noted  . Osteopenia 12/18/2014  . Carotid arterial disease 01/06/2014  . Chronic kidney disease, stage III (moderate) 05/03/2013  . Type II diabetes mellitus, uncontrolled 01/11/2013  . Other and unspecified hyperlipidemia 12/28/2012  . Multinodular goiter 11/01/2012  . Lumbar stenosis with neurogenic claudication 06/29/2012  . Anemia associated with acute blood loss 06/29/2012  . Breast cancer 03/16/2011   Achilles Dunk, OTR/L, CLT Lovett,Amy 01/09/2015, 6:57 PM  [place] [facility] [address] [place], [place], [address] Phone: [phone]    Fax:  [phone]

## 2015-01-10 ENCOUNTER — Encounter: Payer: Medicare Other | Admitting: Physical Therapy

## 2015-01-10 ENCOUNTER — Ambulatory Visit: Payer: Medicare Other | Admitting: Occupational Therapy

## 2015-01-10 ENCOUNTER — Encounter: Payer: Medicare Other | Admitting: Occupational Therapy

## 2015-01-10 DIAGNOSIS — R460 Very low level of personal hygiene: Secondary | ICD-10-CM

## 2015-01-10 DIAGNOSIS — M25532 Pain in left wrist: Secondary | ICD-10-CM

## 2015-01-10 DIAGNOSIS — M25639 Stiffness of unspecified wrist, not elsewhere classified: Secondary | ICD-10-CM

## 2015-01-10 DIAGNOSIS — Z741 Need for assistance with personal care: Secondary | ICD-10-CM

## 2015-01-10 DIAGNOSIS — M6281 Muscle weakness (generalized): Secondary | ICD-10-CM | POA: Diagnosis not present

## 2015-01-10 DIAGNOSIS — R279 Unspecified lack of coordination: Secondary | ICD-10-CM

## 2015-01-11 ENCOUNTER — Encounter: Payer: Medicare Other | Admitting: Physical Therapy

## 2015-01-11 ENCOUNTER — Encounter: Payer: Medicare Other | Admitting: Occupational Therapy

## 2015-01-11 NOTE — Therapy (Signed)
[place] MAIN [facility] SERVICES [address] [place], [place], [address] Phone: [phone]   Fax:  [phone]  Occupational Therapy Treatment  Patient Details  Name: Andrea Wallace MRN: [id number] Date of Birth: 17-Oct-1931 Referring Provider:  Corky Mull, MD  Encounter Date: 01/10/2015      OT End of Session - 01/10/15 1059    Visit Number 7   Number of Visits 16   Date for OT Re-Evaluation 02/14/15   Authorization Type Medicare G code 7   Authorization Time Period 10   OT Start Time 1017   OT Stop Time 1100   OT Time Calculation (min) 43 min   Activity Tolerance Patient tolerated treatment well   Behavior During Therapy [facility] for tasks assessed/performed      Past Medical History  Diagnosis Date  . Anemia   . Arthritis   . Blood transfusion   . Asthma   . Diabetes mellitus   . Hyperlipidemia   . Neuromuscular disorder   . Generalized headaches   . Nasal congestion   . Cough   . Cancer     bilateral breast  . Allergy   . Complication of anesthesia     diff  waking up  . Hypertension     dr Ouida Sills  . Breast cancer 2011    Right    Past Surgical History  Procedure Laterality Date  . Total knee arthroplasty    . Shoulder arthroscopy      bilateral  . Breast surgery  06/2007    right mastectomy  . Breast surgery  2011    left  . Kidney stone surgery    . Back surgery    . Foot surgery    . Mastectomy Right 2011    with Radiaiton  . Reduction mammaplasty Left 2012    There were no vitals filed for this visit.  Visit Diagnosis:  Wrist pain, left  Self-care deficit for dressing and grooming  Decreased ROM of wrist  Muscle weakness (generalized)  Lack of coordination      Subjective Assessment - 01/10/15 1017    Subjective  Patient reports she has been doing ok, some days are better than others with pain.  She did not have any pain coming into therapy this date but feels stiff.    Patient Stated  Goals Patient reports she wants to be able to pick up objects without dropping them, vacuum, take care of herself and do "normal" stuff.     Currently in Pain? No/denies   Pain Score 0-No pain                      OT Treatments/Exercises (OP) - 01/10/15 1017    Wrist Exercises   Other wrist exercises Patient seen for AROM for wrist flexion/ext, ulnar/radial deviation and supination pronation with cues followed by strengthening in all these motions with 1# weight over wedge.  Grasp and release of ball pegs with focus on wrist extension with cues.  Light grip strengthening tasks with hand gripper on 1st setting for 6.6 pounds, patient complained of pain at the thumb IP joint and could not complete task.  Left hand strengthening with med red theraputty for grip and pinch skills with cues.    Fine Motor Coordination   Other Fine Motor Exercises Patient seen for manipulation of small 1/2 inch objects removing from resistive putty and picking up from tabletop and placing into board.  Manipulation  of [place] like discs with cues.   LUE Fluidotherapy   LUE Fluidotherapy Location Hand;Wrist   Splinting   Splinting Neoprene Benik splint to use with vacuum or task that cause pain - not metal splint anymore - ed on donning and wearing                 OT Education - 01/10/15 1045    Education provided Yes   Education Details HEP, strengthening   Person(s) Educated Patient   Methods Explanation;Demonstration;Verbal cues   Comprehension Verbal cues required;Returned demonstration;Verbalized understanding             OT Long Term Goals - 12/20/14 1633    OT LONG TERM GOAL #1   Title Patient will improve left UE strength by 5# to be able to hold and drink from her coffee cup in hand without spilling it.   Baseline unable    Time 8   Period Weeks   Status New   OT LONG TERM GOAL #2   Title Patient will improve ROM and strength in her left hand to hold blow dryer and  curling iron to complete her hair care safely and with modified independence.   Baseline difficult and has burned herself with the curling iron.   Time 8   Period Weeks   Status New   OT LONG TERM GOAL #3   Title Patient will improve supination of her left forearm by 20 degrees to hold her plate and carry to the table with pain level 2 or less.   Baseline unable   Time 8   Period Weeks   Status New   OT LONG TERM GOAL #4   Title Patient will improve strength in LUE to perform cleaning the bathroom with modified independence.    Baseline unable    Time 8   Period Weeks   Status New   OT LONG TERM GOAL #5   Title Patient will improve LUE strength by 1 mm grade to complete ironing tasks.    Baseline difficulty performing and decreased use of left UE in task.   Time 8   Period Weeks   Status New   Long Term Additional Goals   Additional Long Term Goals Yes   OT LONG TERM GOAL #6   Title Patient will tie shoes with modified independence.   Baseline now wearing slip ons since she is unable to tie shoes.   Time 8   Period Weeks   Status New   OT LONG TERM GOAL #7   Title Patient to improve supination of her left forearm and strength to hold a book to read.    Baseline moderate difficulty.   Time 8   Period Weeks   Status New               Plan - 01/10/15 1100    Clinical Impression Statement Patient did not complain of pain this date with activities in therapy and was able to participate in ROM and strengthening tasks.  She has been doing exercises at home while watching TV and has been getting out for brief periods to perform shopping tasks.  Patient has reported days of pain worse than others.  Continue to work towards improving strength and ROM in left UE for patient to be independent with her necessary daily tasks.    Pt will benefit from skilled therapeutic intervention in order to improve on the following deficits (Retired) Decreased activity tolerance;Decreased  knowledge of use of  DME;Decreased strength;Impaired flexibility;Decreased balance;Decreased cognition;Decreased range of motion;Increased edema;Pain;Decreased coordination;Impaired UE functional use   Rehab Potential Good   OT Frequency 2x / week   OT Duration 4 weeks   OT Treatment/Interventions Self-care/ADL training;Fluidtherapy;Parrafin;Therapeutic exercise;DME and/or AE instruction;Passive range of motion;Manual Therapy;Splinting;Patient/family education   Consulted and Agree with Plan of Care Patient        Problem List Patient Active Problem List   Diagnosis Date Noted  . Osteopenia 12/18/2014  . Carotid arterial disease 01/06/2014  . Chronic kidney disease, stage III (moderate) 05/03/2013  . Type II diabetes mellitus, uncontrolled 01/11/2013  . Other and unspecified hyperlipidemia 12/28/2012  . Multinodular goiter 11/01/2012  . Lumbar stenosis with neurogenic claudication 06/29/2012  . Anemia associated with acute blood loss 06/29/2012  . Breast cancer 03/16/2011   Achilles Dunk, OTR/L, CLT Sarthak Rubenstein 01/11/2015, 9:45 AM  [place] MAIN [facility] SERVICES [address] [place], [place], [address] Phone: [phone]   Fax:  [phone]

## 2015-01-15 ENCOUNTER — Encounter: Payer: Medicare Other | Admitting: Physical Therapy

## 2015-01-15 ENCOUNTER — Encounter: Payer: Medicare Other | Admitting: Occupational Therapy

## 2015-01-16 ENCOUNTER — Ambulatory Visit: Payer: Medicare Other | Admitting: Occupational Therapy

## 2015-01-16 ENCOUNTER — Encounter: Payer: Medicare Other | Admitting: Occupational Therapy

## 2015-01-16 ENCOUNTER — Ambulatory Visit: Payer: Medicare Other | Admitting: Physical Therapy

## 2015-01-16 ENCOUNTER — Encounter: Payer: Medicare Other | Admitting: Physical Therapy

## 2015-01-16 DIAGNOSIS — R262 Difficulty in walking, not elsewhere classified: Secondary | ICD-10-CM

## 2015-01-16 DIAGNOSIS — M6281 Muscle weakness (generalized): Secondary | ICD-10-CM | POA: Diagnosis not present

## 2015-01-16 DIAGNOSIS — R531 Weakness: Secondary | ICD-10-CM

## 2015-01-16 DIAGNOSIS — Z741 Need for assistance with personal care: Secondary | ICD-10-CM

## 2015-01-16 DIAGNOSIS — M25532 Pain in left wrist: Secondary | ICD-10-CM

## 2015-01-16 DIAGNOSIS — R460 Very low level of personal hygiene: Secondary | ICD-10-CM

## 2015-01-16 DIAGNOSIS — R279 Unspecified lack of coordination: Secondary | ICD-10-CM

## 2015-01-16 DIAGNOSIS — M25639 Stiffness of unspecified wrist, not elsewhere classified: Secondary | ICD-10-CM

## 2015-01-16 NOTE — Patient Instructions (Signed)
Knee Extension: Resisted (Sitting)   With band looped around right ankle and under other foot, straighten leg with ankle loop. Keep other leg bent to increase resistance. Repeat _10___ times per set. Do __2__ sets per session. Do _2___ sessions per day.  http://orth.exer.us/691   Copyright  VHI. All rights reserved.  ABDUCTION: Sitting - Exercise Ball: Resistance Band (Active)   Sit with feet flat. With band tied around both legs, Lift right leg slightly and, against resistance band, draw it out to side. Complete __2_ sets of __10_ repetitions. Perform _2__ sessions per day.  Copyright  VHI. All rights reserved.  FLEXION: Sitting - Resistance Band (Active)   Sit, both feet flat. Have band tied around both legs above knees, lift right knee toward ceiling.Repeat with other knee Complete _2__ sets of _10__ repetitions. Perform _2__ sessions per day.  http://gtsc.exer.us/21   Copyright  VHI. All rights reserved.  HIP / KNEE: Extension - Sit to Stand   Sitting, lean chest forward, raise hips up from surface. Straighten hips and knees. Weight bear equally on left and right sides. Backs of legs should not push off surface. __10_ reps per set, __2_ sets per day, _5__ days per week Use assistive device as needed.    Toe / Heel Raise (Sitting)   Sitting, raise heels, then rock back on heels and raise toes. Repeat _10___ times.  Copyright  VHI. All rights reserved.

## 2015-01-16 NOTE — Therapy (Signed)
[place] MAIN [facility] SERVICES [address] [place], [place], [address] Phone: [phone]   Fax:  [phone]  Physical Therapy Treatment  Patient Details  Name: Andrea Wallace MRN: [id number] Date of Birth: [birth year] Referring Provider:  Kirk Ruths, MD  Encounter Date: 01/16/2015      PT End of Session - 01/16/15 0942    Visit Number 6   Number of Visits 17   Date for PT Re-Evaluation 02/07/15   Authorization Type 6   Authorization Time Period 10   PT Start Time 0900   PT Stop Time 0928   PT Time Calculation (min) 28 min   Equipment Utilized During Treatment Gait belt   Activity Tolerance Patient limited by pain;Patient limited by fatigue   Behavior During Therapy [facility] for tasks assessed/performed      Past Medical History  Diagnosis Date  . Anemia   . Arthritis   . Blood transfusion   . Asthma   . Diabetes mellitus   . Hyperlipidemia   . Neuromuscular disorder   . Generalized headaches   . Nasal congestion   . Cough   . Cancer     bilateral breast  . Allergy   . Complication of anesthesia     diff  waking up  . Hypertension     dr Ouida Sills  . Breast cancer 2011    Right    Past Surgical History  Procedure Laterality Date  . Total knee arthroplasty    . Shoulder arthroscopy      bilateral  . Breast surgery  06/2007    right mastectomy  . Breast surgery  2011    left  . Kidney stone surgery    . Back surgery    . Foot surgery    . Mastectomy Right 2011    with Radiaiton  . Reduction mammaplasty Left 2012    There were no vitals filed for this visit.  Visit Diagnosis:  Difficulty walking  Weakness      Subjective Assessment - 01/16/15 0904    Subjective Patient denies any falls in last month. She does reports feeling weak and having trouble getting up off the floor if she were to fall. Patient reports doing some standing exercise about 5-10 repetitions about 2x a day.    Currently in Pain?  Yes   Pain Score 7    Pain Location Back   Pain Orientation Lower   Pain Descriptors / Indicators Aching;Sore   Pain Type Chronic pain   Pain Onset More than a month ago   Pain Frequency Constant   Aggravating Factors  standing/walking   Pain Relieving Factors sitting/rest   Effect of Pain on Daily Activities decreased activity tolerance         Therapeutic Ex:  Nustep L2 x 4 min (unbilled);  Quantum Leg press, BLE plate 75# [id number], [id number]; with min Vcs to avoid locking knees in extension with leg press.  Standing on airex: Heel/toe raises x15 Alternate march with 2-0 rail assist x15 each LE;  Patient required min A for balance with alternate march without rail assist with mod VCs for better weight shift and to slow down LE movement and increase trunk control for improved balance.   Seated: Blue tband hip abduction x10 bilaterally Blue tband hip flexion march x10 bilaterally; Blue tband LAQ x10 bilaterally   Sit<>Stand from regular chair without HHA x10;  Patient required min-moderate verbal/tactile cues for correct exercise technique including moderate  verbal cues to improve posture and reduce forward flexed head. She also required mod Vcs to avoid trunk flexion/extension with heel toe raises for increased ankle strengthening.                               PT Education - 01/16/15 [phone]    Education provided Yes   Education Details HEP, see patient instruction, balance, posture   Person(s) Educated Patient   Methods Demonstration;Explanation;Verbal cues;Handout   Comprehension Verbalized understanding;Returned demonstration;Verbal cues required             PT Long Term Goals - 12/13/14 1138    PT LONG TERM GOAL #1   Title Patient will be independent in home exercise program to improve strength/mobility for better functional independence with ADLs 02/07/15   Status New   PT LONG TERM GOAL #2   Title Patient (> 22 years old) will complete  five times sit to stand test in < 15 seconds indicating an increased LE strength and improved balance  02/07/15   Status New   PT LONG TERM GOAL #3   Title Patient will increase 10 meter walk test to >1.37m/s as to improve gait speed for better community ambulation and to reduce fall risk  02/07/15   Status New   PT LONG TERM GOAL #4   Title Patient will be able to improve 6 MW test by 80 points to show improved functinal ability and ability to ambulate intermediate distances.   02/07/15   Status New               Plan - 01/16/15 0943    Clinical Impression Statement Patient was late to therapy appointment. Patient instructed in LE strengthening and balance exercise. She exhibits increased forward head and flexed posture, requiring frequent cues to self correct. Patient reports increased back pain with prolonged standing. She also reports increased fatigue with advanced exercise. Patient would benefit from additional skilled PT intervention to improve balance and LE strength.   Pt will benefit from skilled therapeutic intervention in order to improve on the following deficits Pain;Decreased strength;Postural dysfunction;Difficulty walking;Decreased range of motion   Rehab Potential Fair   PT Frequency 2x / week   PT Duration 8 weeks   PT Treatment/Interventions Balance training;Therapeutic exercise;Therapeutic activities;Stair training;Gait training;Neuromuscular re-education;Manual techniques;Patient/family education   PT Next Visit Plan Ther. ex. for core control, LE control/strengthening, posture correction/standing, walking activities        Problem List Patient Active Problem List   Diagnosis Date Noted  . Osteopenia 12/18/2014  . Carotid arterial disease 01/06/2014  . Chronic kidney disease, stage III (moderate) 05/03/2013  . Type II diabetes mellitus, uncontrolled 01/11/2013  . Other and unspecified hyperlipidemia 12/28/2012  . Multinodular goiter 11/01/2012  . Lumbar  stenosis with neurogenic claudication 06/29/2012  . Anemia associated with acute blood loss 06/29/2012  . Breast cancer 03/16/2011    Hopkins,Margaret, PT, DPT 01/16/2015, 9:51 AM  [place] MAIN [facility] SERVICES [address] [place], [place], [address] Phone: [phone]   Fax:  [phone]

## 2015-01-17 ENCOUNTER — Encounter: Payer: Medicare Other | Admitting: Occupational Therapy

## 2015-01-17 NOTE — Therapy (Signed)
[place] MAIN [facility] SERVICES [address] [place], [place], [address] Phone: [phone]   Fax:  [phone]  Occupational Therapy Treatment  Patient Details  Name: Andrea Wallace MRN: [id number] Date of Birth: [birth year] Referring Provider:  Corky Mull, MD  Encounter Date: 01/16/2015      OT End of Session - 01/16/15 2007    Visit Number 8   Number of Visits 16   Date for OT Re-Evaluation 02/14/15   Authorization Type Medicare G code 8   Authorization Time Period 10   OT Start Time 0930   OT Stop Time 1014   OT Time Calculation (min) 44 min   Activity Tolerance Patient tolerated treatment well   Behavior During Therapy [facility] for tasks assessed/performed      Past Medical History  Diagnosis Date  . Anemia   . Arthritis   . Blood transfusion   . Asthma   . Diabetes mellitus   . Hyperlipidemia   . Neuromuscular disorder   . Generalized headaches   . Nasal congestion   . Cough   . Cancer     bilateral breast  . Allergy   . Complication of anesthesia     diff  waking up  . Hypertension     dr Ouida Sills  . Breast cancer 2011    Right    Past Surgical History  Procedure Laterality Date  . Total knee arthroplasty    . Shoulder arthroscopy      bilateral  . Breast surgery  06/2007    right mastectomy  . Breast surgery  2011    left  . Kidney stone surgery    . Back surgery    . Foot surgery    . Mastectomy Right 2011    with Radiaiton  . Reduction mammaplasty Left 2012    There were no vitals filed for this visit.  Visit Diagnosis:  Muscle weakness (generalized)  Lack of coordination  Decreased ROM of wrist  Self-care deficit for dressing and grooming  Wrist pain, left      Subjective Assessment - 01/16/15 2000    Subjective  Patient reports she hasn't had as much pain, only hurt one day she recalls this week.  No pain in the arm today.   Patient Stated Goals Patient reports she wants to be able to  pick up objects without dropping them, vacuum, take care of herself and do "normal" stuff.     Currently in Pain? Yes   Pain Score 7    Pain Location Hip   Pain Orientation Right   Pain Descriptors / Indicators Aching;Sore   Pain Type Chronic pain   Pain Onset More than a month ago   Pain Frequency Constant   Multiple Pain Sites No                      OT Treatments/Exercises (OP) - 01/16/15 2001    Wrist Exercises   Other wrist exercises Patient seen for AROM for wrist flexion/ext, ulnar/radial deviation and supination pronation with cues followed by strengthening in all these motions with 1# weight over wedge.  Grasp and release with ball and with focus on wrist extension with cues, 3 trials:  1st trial gripping of ball with wrist in flexion, 2nd trial wrist in neutral and 3rd trial with wrist in extension.     Fine Motor Coordination   Other Fine Motor Exercises Knotting and unknotting exercise with left hand  leading task with cues.  Manipulation of coins from tabletop and placing into resistive bank with active reach.   Neurological Re-education Exercises   Other Exercises 1 Patient completing resistive pinch with left hand placing pins onto elevated surface, red and yellow pins only, cues for reach and pinch technique.   LUE Fluidotherapy   LUE Fluidotherapy Location Hand;Wrist   Splinting   Splinting Neoprene Benik splint to use with vacuum or task that cause pain - not metal splint anymore - ed on donning and wearing                 OT Education - 01/16/15 2006    Education provided Yes   Education Details HEP   Person(s) Educated Patient   Methods Explanation;Demonstration;Verbal cues   Comprehension Verbal cues required;Returned demonstration;Verbalized understanding             OT Long Term Goals - 12/20/14 1633    OT LONG TERM GOAL #1   Title Patient will improve left UE strength by 5# to be able to hold and drink from her coffee cup in hand  without spilling it.   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   Baseline difficult and has burned herself with the curling iron.   Time 8   Period Weeks   Status New   OT LONG TERM GOAL #3   Title Patient will improve supination of her left forearm by 20 degrees to hold her plate and carry to the table with pain level 2 or less.   Baseline unable   Time 8   Period Weeks   Status New   OT LONG TERM GOAL #4   Title Patient will improve strength in LUE to perform cleaning the bathroom with modified independence.    Baseline unable    Time 8   Period Weeks   Status New   OT LONG TERM GOAL #5   Title Patient will improve LUE strength by 1 mm grade to complete ironing tasks.    Baseline difficulty performing and decreased use of left UE in task.   Time 8   Period Weeks   Status New   Long Term Additional Goals   Additional Long Term Goals Yes   OT LONG TERM GOAL #6   Title Patient will tie shoes with modified independence.   Baseline now wearing slip ons since she is unable to tie shoes.   Time 8   Period Weeks   Status New   OT LONG TERM GOAL #7   Title Patient to improve supination of her left forearm and strength to hold a book to read.    Baseline moderate difficulty.   Time 8   Period Weeks   Status New               Plan - 01/16/15 2007    Clinical Impression Statement Patient's pain appears to have decreased in frequency, she does report some pain at times but cannot pin point what activities cause her pain.  She has been able to complete exercises in the clinic without complaints of pain.   Rehab Potential Good   OT Frequency 2x / week   OT Duration 4 weeks   OT  Treatment/Interventions Self-care/ADL training;Fluidtherapy;Parrafin;Therapeutic exercise;DME  and/or AE instruction;Passive range of motion;Manual Therapy;Splinting;Patient/family education   OT Home Exercise Plan See pt instruction   Consulted and Agree with Plan of Care Patient        Problem List Patient Active Problem List   Diagnosis Date Noted  . Osteopenia 12/18/2014  . Carotid arterial disease 01/06/2014  . Chronic kidney disease, stage III (moderate) 05/03/2013  . Type II diabetes mellitus, uncontrolled 01/11/2013  . Other and unspecified hyperlipidemia 12/28/2012  . Multinodular goiter 11/01/2012  . Lumbar stenosis with neurogenic claudication 06/29/2012  . Anemia associated with acute blood loss 06/29/2012  . Breast cancer 03/16/2011   Achilles Dunk, OTR/L, CLT  Lovett,Amy 01/17/2015, 8:10 PM  [place] MAIN [facility] SERVICES [address] [place], [place], [address] Phone: [phone]   Fax:  [phone]

## 2015-01-18 ENCOUNTER — Encounter: Payer: Medicare Other | Admitting: Occupational Therapy

## 2015-01-18 ENCOUNTER — Ambulatory Visit: Payer: Medicare Other | Admitting: Physical Therapy

## 2015-01-18 ENCOUNTER — Ambulatory Visit: Payer: Medicare Other | Admitting: Occupational Therapy

## 2015-01-22 ENCOUNTER — Encounter: Payer: Medicare Other | Admitting: Occupational Therapy

## 2015-01-22 ENCOUNTER — Encounter: Payer: Self-pay | Admitting: Occupational Therapy

## 2015-01-22 ENCOUNTER — Encounter: Payer: Medicare Other | Admitting: Physical Therapy

## 2015-01-22 ENCOUNTER — Ambulatory Visit: Payer: Medicare Other | Admitting: Occupational Therapy

## 2015-01-22 DIAGNOSIS — M6281 Muscle weakness (generalized): Secondary | ICD-10-CM

## 2015-01-22 DIAGNOSIS — R279 Unspecified lack of coordination: Secondary | ICD-10-CM

## 2015-01-22 NOTE — Therapy (Signed)
[place] MAIN [facility] SERVICES [address] [place], [place], [address] Phone: [phone]   Fax:  [phone]  Occupational Therapy Treatment  Patient Details  Name: Andrea Wallace MRN: [id number] Date of Birth: 1931-08-12 Referring Provider:  Corky Mull, MD  Encounter Date: 01/22/2015      OT End of Session - 01/22/15 1009    Visit Number 9   Number of Visits 16   Date for OT Re-Evaluation 02/14/15   OT Start Time 0915   OT Stop Time 1000   OT Time Calculation (min) 45 min   Activity Tolerance Patient tolerated treatment well   Behavior During Therapy [facility] for tasks assessed/performed      Past Medical History  Diagnosis Date  . Anemia   . Arthritis   . Blood transfusion   . Asthma   . Diabetes mellitus   . Hyperlipidemia   . Neuromuscular disorder   . Generalized headaches   . Nasal congestion   . Cough   . Cancer     bilateral breast  . Allergy   . Complication of anesthesia     diff  waking up  . Hypertension     dr Ouida Sills  . Breast cancer 2011    Right    Past Surgical History  Procedure Laterality Date  . Total knee arthroplasty    . Shoulder arthroscopy      bilateral  . Breast surgery  06/2007    right mastectomy  . Breast surgery  2011    left  . Kidney stone surgery    . Back surgery    . Foot surgery    . Mastectomy Right 2011    with Radiaiton  . Reduction mammaplasty Left 2012    There were no vitals filed for this visit.  Visit Diagnosis:  Lack of coordination  Muscle weakness (generalized)      Subjective Assessment - 01/22/15 0941    Subjective  Reports continued reduced pain.   Currently in Pain? Yes   Pain Score 1    Pain Location Wrist    Mobilization of wrist and fingers completed. Used Judy/instructo board to flip pegs Completed connect 4 for fine motor Used velcro checkers for opposition and pinch Used color coded clips from hardest to easiest for pinch, only the 2 easiest,  Completed twisty grip from hardest to easiest on 5 settings with 20 repititions. Picked up coins largest to smallest and placed in coin counter                                  OT Long Term Goals - 12/20/14 1633    OT LONG TERM GOAL #1   Title Patient will improve left UE strength by 5# to be able to hold and drink from her coffee cup in hand without spilling it.   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   Baseline difficult and has burned herself with the curling iron.   Time 8   Period Weeks   Status New   OT LONG TERM GOAL #3   Title Patient will improve supination of her left forearm by  20 degrees to hold her plate and carry to the table with pain level 2 or less.   Baseline unable   Time 8   Period Weeks   Status New   OT LONG TERM GOAL #4   Title Patient will improve strength in LUE to perform cleaning the bathroom with modified independence.    Baseline unable    Time 8   Period Weeks   Status New   OT LONG TERM GOAL #5   Title Patient will improve LUE strength by 1 mm grade to complete ironing tasks.    Baseline difficulty performing and decreased use of left UE in task.   Time 8   Period Weeks   Status New   Long Term Additional Goals   Additional Long Term Goals Yes   OT LONG TERM GOAL #6   Title Patient will tie shoes with modified independence.   Baseline now wearing slip ons since she is unable to tie shoes.   Time 8   Period Weeks   Status New   OT LONG TERM GOAL #7   Title Patient to improve supination of her left forearm and strength to hold a book to read.    Baseline moderate difficulty.   Time 8   Period Weeks   Status New               Plan - 02/19/2015 1013    Clinical Impression Statement Patient improving with  dexterity and strenght but still limited. Pain has decreased.   Pt will benefit from skilled therapeutic intervention in order to improve on the following deficits (Retired) Decreased activity tolerance;Decreased knowledge of use of DME;Decreased strength;Impaired flexibility;Decreased balance;Decreased cognition;Decreased range of motion;Increased edema;Pain;Decreased coordination;Impaired UE functional use   Rehab Potential Good   OT Treatment/Interventions Self-care/ADL training;Fluidtherapy;Parrafin;Therapeutic exercise;DME and/or AE instruction;Passive range of motion;Manual Therapy;Splinting;Patient/family education   Consulted and Agree with Plan of Care Patient          G-Codes - 2015-02-19 1015    Functional Assessment Tool Used ROM measurements, strength testing, clinical judgment   Functional Limitation Self care   Self Care Current Status [id number]) At least 40 percent but less than 60 percent impaired, limited or restricted   Self Care Goal Status [id number]) At least 1 percent but less than 20 percent impaired, limited or restricted      Problem List Patient Active Problem List   Diagnosis Date Noted  . Osteopenia 12/18/2014  . Carotid arterial disease 01/06/2014  . Chronic kidney disease, stage III (moderate) 05/03/2013  . Type II diabetes mellitus, uncontrolled 01/11/2013  . Other and unspecified hyperlipidemia 12/28/2012  . Multinodular goiter 11/01/2012  . Lumbar stenosis with neurogenic claudication 06/29/2012  . Anemia associated with acute blood loss 06/29/2012  . Breast cancer 03/16/2011    Myrene Galas, MS/OTR/L  Feb 19, 2015, 10:22 AM  [place] MAIN [facility] SERVICES [address] [place], [place], [address] Phone: [phone]   Fax:  [phone]

## 2015-01-22 NOTE — Patient Instructions (Signed)
Taught use of dycem for opening jars and bottles. Issued 2 pieces of dycem.

## 2015-01-24 ENCOUNTER — Ambulatory Visit: Payer: Medicare Other | Attending: Internal Medicine | Admitting: Occupational Therapy

## 2015-01-24 ENCOUNTER — Ambulatory Visit: Payer: Medicare Other | Admitting: Physical Therapy

## 2015-01-24 ENCOUNTER — Encounter: Payer: Self-pay | Admitting: Physical Therapy

## 2015-01-24 ENCOUNTER — Encounter: Payer: Medicare Other | Admitting: Physical Therapy

## 2015-01-24 DIAGNOSIS — M6281 Muscle weakness (generalized): Secondary | ICD-10-CM | POA: Insufficient documentation

## 2015-01-24 DIAGNOSIS — R531 Weakness: Secondary | ICD-10-CM | POA: Insufficient documentation

## 2015-01-24 DIAGNOSIS — R29898 Other symptoms and signs involving the musculoskeletal system: Secondary | ICD-10-CM | POA: Diagnosis present

## 2015-01-24 DIAGNOSIS — M25639 Stiffness of unspecified wrist, not elsewhere classified: Secondary | ICD-10-CM

## 2015-01-24 DIAGNOSIS — R262 Difficulty in walking, not elsewhere classified: Secondary | ICD-10-CM | POA: Diagnosis present

## 2015-01-24 DIAGNOSIS — R460 Very low level of personal hygiene: Secondary | ICD-10-CM | POA: Diagnosis present

## 2015-01-24 DIAGNOSIS — Z741 Need for assistance with personal care: Secondary | ICD-10-CM

## 2015-01-24 DIAGNOSIS — R279 Unspecified lack of coordination: Secondary | ICD-10-CM | POA: Insufficient documentation

## 2015-01-24 DIAGNOSIS — M25532 Pain in left wrist: Secondary | ICD-10-CM | POA: Insufficient documentation

## 2015-01-24 NOTE — Therapy (Signed)
[place] MAIN [facility] SERVICES [address] [place], [place], [address] Phone: [phone]   Fax:  [phone]  Physical Therapy Treatment  Patient Details  Name: Andrea Wallace MRN: [id number] Date of Birth: 05/20/1932 Referring Provider:  Corky Mull, MD  Encounter Date: 01/24/2015      PT End of Session - 01/24/15 1002    Visit Number 7   Number of Visits 17   Date for PT Re-Evaluation 02/07/15   Authorization Type 7   Authorization Time Period 10   PT Start Time 0855   PT Stop Time 0933   PT Time Calculation (min) 38 min   Equipment Utilized During Treatment Gait belt   Activity Tolerance Patient limited by pain;Patient tolerated treatment well   Behavior During Therapy [facility] for tasks assessed/performed      Past Medical History  Diagnosis Date  . Anemia   . Arthritis   . Blood transfusion   . Asthma   . Diabetes mellitus   . Hyperlipidemia   . Neuromuscular disorder   . Generalized headaches   . Nasal congestion   . Cough   . Cancer     bilateral breast  . Allergy   . Complication of anesthesia     diff  waking up  . Hypertension     dr Ouida Sills  . Breast cancer 2011    Right    Past Surgical History  Procedure Laterality Date  . Total knee arthroplasty    . Shoulder arthroscopy      bilateral  . Breast surgery  06/2007    right mastectomy  . Breast surgery  2011    left  . Kidney stone surgery    . Back surgery    . Foot surgery    . Mastectomy Right 2011    with Radiaiton  . Reduction mammaplasty Left 2012    There were no vitals filed for this visit.  Visit Diagnosis:  Difficulty walking  Weakness      Subjective Assessment - 01/24/15 0903    Subjective Patient reports increased back pain today. She was late to PT appointment. "Its hard for me to get up this early." Patient reports compliance with HEP;   Currently in Pain? Yes   Pain Score 7    Pain Location Back   Pain Orientation Lower   Pain Descriptors / Indicators Aching   Pain Type Chronic pain   Pain Radiating Towards right hip   Pain Onset More than a month ago   Pain Frequency Intermittent   Aggravating Factors  standing/walking   Pain Relieving Factors sitting/rest   Effect of Pain on Daily Activities decreased activity tolerance          TREATMENT: Hooklying exercise concurrent with moist heat: Lumbar trunk rotation x1 min; SLR flexion x15 bilaterally; Green tband hip abduction x15 bilaterally; Alternate march with green tband x15 bilaterally; Patient required min-moderate verbal/tactile cues for correct exercise technique and to increase core abdominal stabilization with UE/LE movement especially with hooklying exercise to reduce back discomfort.   Seated: Cervical extension/flexion 2x10 BUE Green tband rows 2x10; Posterior shoulder rolls x15 BUE;  Standing on airex with 1 HHA: Heel/toe raises x15; Alternate march x15 bilaterally; BUE ball pass side/side x10 each direction with min A for balance; Patient required min VCs for balance stability, including to increase trunk control for less loss of balance with smaller base of support   Forward standing on 1/2 bolster: BUE wand  flexion x10; Unsupported, eyes open/closed 10 sec hold x1 each;  Standing on airex without HHA: Eyes open/closed 10 sec hold x2 each with cues for gaze stabilization and balance;   Forward lunges with 1 rail assist x10 each LE Sit<>Stand from chair without HHA x10;  Patient required min-moderate verbal/tactile cues for correct exercise technique.                     PT Education - 01/24/15 1001    Education provided Yes   Education Details exercise, posture   Person(s) Educated Patient   Methods Explanation;Demonstration;Verbal cues   Comprehension Verbalized understanding;Returned demonstration;Verbal cues required             PT Long Term Goals - 12/13/14 1138    PT LONG TERM GOAL #1    Title Patient will be independent in home exercise program to improve strength/mobility for better functional independence with ADLs 02/07/15   Status New   PT LONG TERM GOAL #2   Title Patient (> 79 years old) will complete five times sit to stand test in < 15 seconds indicating an increased LE strength and improved balance  02/07/15   Status New   PT LONG TERM GOAL #3   Title Patient will increase 10 meter walk test to >1.79m/s as to improve gait speed for better community ambulation and to reduce fall risk  02/07/15   Status New   PT LONG TERM GOAL #4   Title Patient will be able to improve 6 MW test by 80 points to show improved functinal ability and ability to ambulate intermediate distances.   02/07/15   Status New               Plan - 01/24/15 1103    Clinical Impression Statement Patient was instructed in BLE strengthening and balance exercise. She reports increased back pain and required instruction to improve posture. Initiated postural strengthening exercises today. Patient responded well to cues being able to correct posture approximately 70% of the time. Patient would benefit from additional skilled PT intervention to improve balance/strength and reduce pain with ADLs.   Pt will benefit from skilled therapeutic intervention in order to improve on the following deficits Pain;Decreased strength;Postural dysfunction;Difficulty walking;Decreased range of motion   Rehab Potential Fair   PT Frequency 2x / week   PT Duration 8 weeks   PT Treatment/Interventions Balance training;Therapeutic exercise;Therapeutic activities;Stair training;Gait training;Neuromuscular re-education;Manual techniques;Patient/family education   PT Next Visit Plan Ther. ex. for core control, LE control/strengthening, posture correction/standing, walking activities   PT Home Exercise Plan continue as previously given        Problem List Patient Active Problem List   Diagnosis Date Noted  . Osteopenia  12/18/2014  . Carotid arterial disease 01/06/2014  . Chronic kidney disease, stage III (moderate) 05/03/2013  . Type II diabetes mellitus, uncontrolled 01/11/2013  . Other and unspecified hyperlipidemia 12/28/2012  . Multinodular goiter 11/01/2012  . Lumbar stenosis with neurogenic claudication 06/29/2012  . Anemia associated with acute blood loss 06/29/2012  . Breast cancer 03/16/2011    Hopkins,Kenlee Maler, PT, DPT 01/24/2015, 12:47 PM  [place] MAIN [facility] SERVICES [address] [place], [place], [address] Phone: [phone]   Fax:  [phone]

## 2015-01-25 ENCOUNTER — Other Ambulatory Visit: Payer: Self-pay | Admitting: Endocrinology

## 2015-01-25 NOTE — Therapy (Signed)
[place] MAIN [facility] SERVICES [address] [place], [place], [address] Phone: [phone]   Fax:  [phone]  Occupational Therapy Treatment Occupational Therapy Progress Note 12/20/2014  to 01/24/2015   Patient Details  Name: Andrea Wallace MRN: [id number] Date of Birth: Jun 27, 1931 Referring Provider:  Corky Mull, MD  Encounter Date: 01/24/2015      OT End of Session - 01/24/15 2209    Visit Number 10   Number of Visits 16   Date for OT Re-Evaluation 02/14/15   Authorization Type Medicare G code 10   Authorization Time Period 10   OT Start Time 0933   OT Stop Time 1015   OT Time Calculation (min) 42 min   Activity Tolerance Patient tolerated treatment well   Behavior During Therapy [facility] for tasks assessed/performed      Past Medical History  Diagnosis Date  . Anemia   . Arthritis   . Blood transfusion   . Asthma   . Diabetes mellitus   . Hyperlipidemia   . Neuromuscular disorder   . Generalized headaches   . Nasal congestion   . Cough   . Cancer     bilateral breast  . Allergy   . Complication of anesthesia     diff  waking up  . Hypertension     dr Ouida Sills  . Breast cancer 2011    Right    Past Surgical History  Procedure Laterality Date  . Total knee arthroplasty    . Shoulder arthroscopy      bilateral  . Breast surgery  06/2007    right mastectomy  . Breast surgery  2011    left  . Kidney stone surgery    . Back surgery    . Foot surgery    . Mastectomy Right 2011    with Radiaiton  . Reduction mammaplasty Left 2012    There were no vitals filed for this visit.  Visit Diagnosis:  Lack of coordination  Muscle weakness (generalized)  Decreased ROM of wrist  Self-care deficit for dressing and grooming  Wrist pain, left      Subjective Assessment - 01/24/15 0941    Subjective  Patient reports she has been using the left arm during her daily tasks.  [place], not sure if her husband will want to go.     Pertinent History Patient suffered a left distal radius fracture/colles fracture on Oct 11, 2014.      Patient Stated Goals Patient reports she wants to be able to pick up objects without dropping them, vacuum, take care of herself and do "normal" stuff.     Currently in Pain? No/denies   Pain Score 0-No pain                      OT Treatments/Exercises (OP) - 01/24/15 2209    ADLs   ADL Comments Updated goals this date to reflect progress with self care tasks.    Fine Motor Coordination   Other Fine Motor Exercises Patient seen for manipulation of [place] like discs with left hand with cues for coordination.     Neurological Re-education Exercises   Other Exercises 1 Patient seen for resistive pinch pins for lateral and 3 point pinch to place onto elevated surface with cues for reach and pinch technique, still able to only do yellow and red levels.  Resistive putty for grip and  pinch for 10 reps each, ball squeezes with wrist extension combo, AROM exercises for wrist flexion/extension, ulnar and radial deviation and supination/pronation.     Other Exercises 2 Reassessment of progress.                OT Education - 01/24/15 2209    Education provided Yes   Education Details HEP, ROM, functional use of hand   Person(s) Educated Patient   Methods Explanation;Demonstration;Verbal cues   Comprehension Verbal cues required;Returned demonstration;Verbalized understanding             OT Long Term Goals - 01/25/15 1037    OT LONG TERM GOAL #1   Title Patient will improve left UE strength by 5# to be able to hold and drink from her coffee cup in hand without spilling it.   Baseline improved by 2# at 10th visit.   Time 8   Period Weeks   Status On-going   OT LONG TERM GOAL #2   Title Patient will improve ROM and strength in her left hand to hold blow dryer and curling iron to complete her hair care safely  and with modified independence.   Baseline improved but still ongoing.   Time 8   Period Weeks   Status On-going   OT LONG TERM GOAL #3   Title Patient will improve supination of her left forearm by 20 degrees to hold her plate and carry to the table with pain level 2 or less.   Baseline decreased pain and supination now to 85 degrees.    Time 8   Period Weeks   Status Achieved   OT LONG TERM GOAL #4   Title Patient will improve strength in LUE to perform cleaning the bathroom with modified independence.    Time 8   Period Weeks   Status On-going   OT LONG TERM GOAL #5   Title Patient will improve LUE strength by 1 mm grade to complete ironing tasks.    Baseline difficulty performing and decreased use of left UE in task.   Time 8   Period Weeks   Status On-going   Long Term Additional Goals   Additional Long Term Goals Yes   OT LONG TERM GOAL #6   Title Patient will tie shoes with modified independence.   Baseline has not tried tying shoes yet.     Time 8   Period Weeks   Status On-going   OT LONG TERM GOAL #7   Title Patient to improve supination of her left forearm and strength to hold a book to read.    Baseline improved but still needs to take breaks to stretch.   Time 8   Period Weeks   Status On-going               Plan - 01/24/15 2210    Clinical Impression Statement Patient has improved in all areas.  Decreased complaints of pain with left wrist but still has some pain at home with specific movements.  Her wrist flexion has improved to 40 degrees, extension to 65 degrees, supination to 85 degrees and grip to 8#.  She has been using the hand at home functionally in daily tasks more.  She would benefit from continued OT to further improve ROM and strength of LUE to improve her independence in daily tasks.    Pt will benefit from skilled therapeutic intervention in order to improve on the following deficits (Retired) Decreased activity tolerance;Decreased  knowledge of use of  DME;Decreased strength;Impaired flexibility;Decreased balance;Decreased cognition;Decreased range of motion;Increased edema;Pain;Decreased coordination;Impaired UE functional use   Rehab Potential Good   OT Frequency 2x / week   OT Duration 4 weeks   OT Treatment/Interventions Self-care/ADL training;Fluidtherapy;Parrafin;Therapeutic exercise;DME and/or AE instruction;Passive range of motion;Manual Therapy;Splinting;Patient/family education   OT Home Exercise Plan See pt instruction   Consulted and Agree with Plan of Care Patient        Problem List Patient Active Problem List   Diagnosis Date Noted  . Osteopenia 12/18/2014  . Carotid arterial disease 01/06/2014  . Chronic kidney disease, stage III (moderate) 05/03/2013  . Type II diabetes mellitus, uncontrolled 01/11/2013  . Other and unspecified hyperlipidemia 12/28/2012  . Multinodular goiter 11/01/2012  . Lumbar stenosis with neurogenic claudication 06/29/2012  . Anemia associated with acute blood loss 06/29/2012  . Breast cancer 03/16/2011   Achilles Dunk, OTR/L, CLT  Pavielle Biggar 01/25/2015, 10:44 AM  [place] MAIN [facility] SERVICES [address] [place], [place], [address] Phone: [phone]   Fax:  [phone]

## 2015-01-29 ENCOUNTER — Encounter: Payer: Medicare Other | Admitting: Physical Therapy

## 2015-01-29 ENCOUNTER — Ambulatory Visit: Payer: Medicare Other | Admitting: Occupational Therapy

## 2015-01-29 ENCOUNTER — Encounter: Payer: Medicare Other | Admitting: Occupational Therapy

## 2015-01-30 ENCOUNTER — Encounter: Payer: Self-pay | Admitting: Physical Therapy

## 2015-01-30 ENCOUNTER — Ambulatory Visit: Payer: Medicare Other | Admitting: Physical Therapy

## 2015-01-30 DIAGNOSIS — R279 Unspecified lack of coordination: Secondary | ICD-10-CM

## 2015-01-30 DIAGNOSIS — R262 Difficulty in walking, not elsewhere classified: Secondary | ICD-10-CM

## 2015-01-30 DIAGNOSIS — R531 Weakness: Secondary | ICD-10-CM

## 2015-01-30 DIAGNOSIS — M6281 Muscle weakness (generalized): Secondary | ICD-10-CM

## 2015-01-30 NOTE — Therapy (Signed)
[place] MAIN [facility] SERVICES [address] [place], [place], [address] Phone: [phone]   Fax:  [phone]  Physical Therapy Treatment  Patient Details  Name: Andrea Wallace MRN: [id number] Date of Birth: 1931/06/23 Referring Provider:  Kirk Ruths, MD  Encounter Date: 01/30/2015      PT End of Session - 01/30/15 0932    Visit Number 8   Number of Visits 17   Date for PT Re-Evaluation 02/07/15   Authorization Type 8   Authorization Time Period 10   PT Start Time 0925   Behavior During Therapy [facility] for tasks assessed/performed      Past Medical History  Diagnosis Date  . Anemia   . Arthritis   . Blood transfusion   . Asthma   . Diabetes mellitus   . Hyperlipidemia   . Neuromuscular disorder   . Generalized headaches   . Nasal congestion   . Cough   . Cancer     bilateral breast  . Allergy   . Complication of anesthesia     diff  waking up  . Hypertension     dr Ouida Sills  . Breast cancer 2011    Right    Past Surgical History  Procedure Laterality Date  . Total knee arthroplasty    . Shoulder arthroscopy      bilateral  . Breast surgery  06/2007    right mastectomy  . Breast surgery  2011    left  . Kidney stone surgery    . Back surgery    . Foot surgery    . Mastectomy Right 2011    with Radiaiton  . Reduction mammaplasty Left 2012    There were no vitals filed for this visit.  Visit Diagnosis:  Difficulty walking  Weakness  Lack of coordination  Muscle weakness (generalized)      Subjective Assessment - 01/30/15 0928    Subjective Patient reports back pain 6/10 today. She was late to her appointment. Patient has continous hot flashes during therapy.    Pain Score 6         Therapeutic exercise and neuromuscular training:    Quantum Leg press, BLE plate 45# x 20; with min Vcs to avoid locking knees in extension with leg press. Sit<>Stand from regular chair without HHA x10 Standing  on airex, tapping stool x 20 x 2 Heel/toe raises x15 Tilt board  Fwd/bwd, and anter/posterior with horizontal add/abd with TA contraction x 20  4 way hip with RTB x 15 withtout extension on RLE  Step ups to 6 inch stool x 20 Eccentric step downs from 6 inch stool x 20 CGA needed for head turns and trunk rotation to maintain balance and upright posture Patient performs intermediate level exercises with pain behaviors and needs verbal cuing for postural alignment and head positioning                           PT Education - 01/30/15 0931    Education provided Yes   Education Details HEP   Person(s) Educated Patient   Methods Explanation   Comprehension Verbalized understanding             PT Long Term Goals - 12/13/14 1138    PT LONG TERM GOAL #1   Title Patient will be independent in home exercise program to improve strength/mobility for better functional independence with ADLs 02/07/15   Status New  PT LONG TERM GOAL #2   Title Patient (> 75 years old) will complete five times sit to stand test in < 15 seconds indicating an increased LE strength and improved balance  02/07/15   Status New   PT LONG TERM GOAL #3   Title Patient will increase 10 meter walk test to >1.34m/s as to improve gait speed for better community ambulation and to reduce fall risk  02/07/15   Status New   PT LONG TERM GOAL #4   Title Patient will be able to improve 6 MW test by 80 points to show improved functinal ability and ability to ambulate intermediate distances.   02/07/15   Status New               Plan - 01/30/15 0950    Clinical Impression Statement Patient has decreased core strength and balance deficits. She is able to perform dynamic standing balance activities and strengthening activities without increasing her pain level.    Pt will benefit from skilled therapeutic intervention in order to improve on the following deficits Pain;Decreased strength;Postural  dysfunction;Difficulty walking;Decreased range of motion   Rehab Potential Fair   PT Frequency 2x / week   PT Duration 8 weeks   PT Treatment/Interventions Balance training;Therapeutic exercise;Therapeutic activities;Stair training;Gait training;Neuromuscular re-education;Manual techniques;Patient/family education   PT Next Visit Plan Ther. ex. for core control, LE control/strengthening, posture correction/standing, walking activities   PT Home Exercise Plan continue as previously given        Problem List Patient Active Problem List   Diagnosis Date Noted  . Osteopenia 12/18/2014  . Carotid arterial disease 01/06/2014  . Chronic kidney disease, stage III (moderate) 05/03/2013  . Type II diabetes mellitus, uncontrolled 01/11/2013  . Other and unspecified hyperlipidemia 12/28/2012  . Multinodular goiter 11/01/2012  . Lumbar stenosis with neurogenic claudication 06/29/2012  . Anemia associated with acute blood loss 06/29/2012  . Breast cancer 03/16/2011    [name] 01/30/2015, 9:53 AM  [place] MAIN [facility] SERVICES [address] [place], [place], [address] Phone: [phone]   Fax:  [phone]

## 2015-01-31 ENCOUNTER — Ambulatory Visit: Payer: Medicare Other | Admitting: Occupational Therapy

## 2015-01-31 ENCOUNTER — Encounter: Payer: Medicare Other | Admitting: Physical Therapy

## 2015-01-31 DIAGNOSIS — R279 Unspecified lack of coordination: Secondary | ICD-10-CM | POA: Diagnosis not present

## 2015-01-31 DIAGNOSIS — M25639 Stiffness of unspecified wrist, not elsewhere classified: Secondary | ICD-10-CM

## 2015-01-31 DIAGNOSIS — M25532 Pain in left wrist: Secondary | ICD-10-CM

## 2015-01-31 DIAGNOSIS — Z741 Need for assistance with personal care: Secondary | ICD-10-CM

## 2015-01-31 DIAGNOSIS — M6281 Muscle weakness (generalized): Secondary | ICD-10-CM

## 2015-01-31 DIAGNOSIS — R460 Very low level of personal hygiene: Secondary | ICD-10-CM

## 2015-02-01 ENCOUNTER — Encounter: Payer: Self-pay | Admitting: Occupational Therapy

## 2015-02-01 NOTE — Therapy (Signed)
[place] MAIN [facility] SERVICES [address] [place], [place], [address] Phone: [phone]   Fax:  [phone]  Occupational Therapy Treatment  Patient Details  Name: Andrea Wallace MRN: [id number] Date of Birth: [birth year] Referring Provider:  Corky Mull, MD  Encounter Date: 01/31/2015      OT End of Session - 01/31/15 1009    Visit Number 11   Number of Visits 16   Date for OT Re-Evaluation 02/14/15   Authorization Type Medicare G code 11   Authorization Time Period 20   OT Start Time 0930   OT Stop Time 1014   OT Time Calculation (min) 44 min   Activity Tolerance Patient tolerated treatment well   Behavior During Therapy [facility] for tasks assessed/performed      Past Medical History  Diagnosis Date  . Anemia   . Arthritis   . Blood transfusion   . Asthma   . Diabetes mellitus   . Hyperlipidemia   . Neuromuscular disorder   . Generalized headaches   . Nasal congestion   . Cough   . Cancer     bilateral breast  . Allergy   . Complication of anesthesia     diff  waking up  . Hypertension     dr Ouida Sills  . Breast cancer 2011    Right    Past Surgical History  Procedure Laterality Date  . Total knee arthroplasty    . Shoulder arthroscopy      bilateral  . Breast surgery  06/2007    right mastectomy  . Breast surgery  2011    left  . Kidney stone surgery    . Back surgery    . Foot surgery    . Mastectomy Right 2011    with Radiaiton  . Reduction mammaplasty Left 2012    There were no vitals filed for this visit.  Visit Diagnosis:  Muscle weakness (generalized)  Lack of coordination  Decreased ROM of wrist  Self-care deficit for dressing and grooming  Wrist pain, left      Subjective Assessment - 01/31/15 0939    Subjective  Patient reports she has been hot all morning and has sweat rolling down her neck and back.  She reports this is normal for her despite being in the air conditioning.  Brought a  small personal hand held fan with her.     Pertinent History Patient suffered a left distal radius fracture/colles fracture on Oct 11, 2014.      Patient Stated Goals Patient reports she wants to be able to pick up objects without dropping them, vacuum, take care of herself and do "normal" stuff.     Currently in Pain? No/denies   Pain Score 0-No pain                      OT Treatments/Exercises (OP) - 01/31/15 1559    Fine Motor Coordination   Other Fine Motor Exercises Patient seen for manipulation of small 1/2 inch objects from resistive putty and placing into container.  When picking up items from the surface, moving to the palm and then back to the fingertips to place into container.     Neurological Re-education Exercises   Other Exercises 1 Patient seen for 1# wrist strengthening for flexion extension, ulnar and radial deviation for 10 reps for 2 sets then upgraded to 2# for flexion/extension for 10 reps.  Ball squeeze for hand grip followed by  ball grip with wrist extension with squeeze at the top of the range.  1# dowel exercises for shoulder ROM and strengthening for shoulder flexion and chest press for 10 reps each for 2 sets. ABD/ADD with dowel but no added weight.                  OT Education - 01/31/15 1546    Education provided Yes   Education Details wrist exercises, use of left arm in daily tasks, HEP   Person(s) Educated Patient;Spouse;Parent(s)   Methods Explanation;Demonstration;Verbal cues   Comprehension Verbal cues required;Returned demonstration;Verbalized understanding             OT Long Term Goals - 01/25/15 1037    OT LONG TERM GOAL #1   Title Patient will improve left UE strength by 5# to be able to hold and drink from her coffee cup in hand without spilling it.   Baseline improved by 2# at 10th visit.   Time 8   Period Weeks   Status On-going   OT LONG TERM GOAL #2   Title Patient will improve ROM and strength in her left hand  to hold blow dryer and curling iron to complete her hair care safely and with modified independence.   Baseline improved but still ongoing.   Time 8   Period Weeks   Status On-going   OT LONG TERM GOAL #3   Title Patient will improve supination of her left forearm by 20 degrees to hold her plate and carry to the table with pain level 2 or less.   Baseline decreased pain and supination now to 85 degrees.    Time 8   Period Weeks   Status Achieved   OT LONG TERM GOAL #4   Title Patient will improve strength in LUE to perform cleaning the bathroom with modified independence.    Time 8   Period Weeks   Status On-going   OT LONG TERM GOAL #5   Title Patient will improve LUE strength by 1 mm grade to complete ironing tasks.    Baseline difficulty performing and decreased use of left UE in task.   Time 8   Period Weeks   Status On-going   Long Term Additional Goals   Additional Long Term Goals Yes   OT LONG TERM GOAL #6   Title Patient will tie shoes with modified independence.   Baseline has not tried tying shoes yet.     Time 8   Period Weeks   Status On-going   OT LONG TERM GOAL #7   Title Patient to improve supination of her left forearm and strength to hold a book to read.    Baseline improved but still needs to take breaks to stretch.   Time 8   Period Weeks   Status On-going               Plan - 01/31/15 1010    Clinical Impression Statement Patient has continued to progress with ROM and strength in the left wrist and hand and now reports no pain at times to minimal pain.  She is performing more tasks at home but still needs additional focus on strengthening of the left UE to help with carrying objects, opening containers and performing self care and household tasks.     Pt will benefit from skilled therapeutic intervention in order to improve on the following deficits (Retired) Decreased activity tolerance;Decreased knowledge of use of DME;Decreased strength;Impaired  flexibility;Decreased balance;Decreased cognition;Decreased range  of motion;Increased edema;Pain;Decreased coordination;Impaired UE functional use   Rehab Potential Good   OT Frequency 2x / week   OT Duration 4 weeks   OT Treatment/Interventions Self-care/ADL training;Fluidtherapy;Parrafin;Therapeutic exercise;DME and/or AE instruction;Passive range of motion;Manual Therapy;Splinting;Patient/family education   OT Home Exercise Plan See pt instruction   Consulted and Agree with Plan of Care Patient        Problem List Patient Active Problem List   Diagnosis Date Noted  . Osteopenia 12/18/2014  . Carotid arterial disease 01/06/2014  . Chronic kidney disease, stage III (moderate) 05/03/2013  . Type II diabetes mellitus, uncontrolled 01/11/2013  . Other and unspecified hyperlipidemia 12/28/2012  . Multinodular goiter 11/01/2012  . Lumbar stenosis with neurogenic claudication 06/29/2012  . Anemia associated with acute blood loss 06/29/2012  . Breast cancer 03/16/2011   Achilles Dunk, OTR/L, CLT Keniel Ralston 02/01/2015, 4:03 PM  [place] MAIN [facility] SERVICES [address] [place], [place], [address] Phone: [phone]   Fax:  [phone]

## 2015-02-05 ENCOUNTER — Ambulatory Visit: Payer: Medicare Other | Admitting: Occupational Therapy

## 2015-02-05 ENCOUNTER — Ambulatory Visit: Payer: Medicare Other | Admitting: Physical Therapy

## 2015-02-05 ENCOUNTER — Encounter: Payer: Medicare Other | Admitting: Physical Therapy

## 2015-02-05 ENCOUNTER — Encounter: Payer: Medicare Other | Admitting: Occupational Therapy

## 2015-02-07 ENCOUNTER — Ambulatory Visit: Payer: Medicare Other | Admitting: Physical Therapy

## 2015-02-07 ENCOUNTER — Encounter: Payer: Medicare Other | Admitting: Physical Therapy

## 2015-02-07 ENCOUNTER — Ambulatory Visit: Payer: Medicare Other | Admitting: Occupational Therapy

## 2015-03-20 ENCOUNTER — Ambulatory Visit (INDEPENDENT_AMBULATORY_CARE_PROVIDER_SITE_OTHER): Payer: Medicare Other | Admitting: Endocrinology

## 2015-03-20 ENCOUNTER — Other Ambulatory Visit (INDEPENDENT_AMBULATORY_CARE_PROVIDER_SITE_OTHER): Payer: Medicare Other

## 2015-03-20 ENCOUNTER — Encounter: Payer: Self-pay | Admitting: Endocrinology

## 2015-03-20 VITALS — BP 128/60 | HR 69 | Temp 98.8°F | Resp 16 | Ht 65.0 in | Wt 174.0 lb

## 2015-03-20 DIAGNOSIS — E1165 Type 2 diabetes mellitus with hyperglycemia: Secondary | ICD-10-CM | POA: Diagnosis not present

## 2015-03-20 DIAGNOSIS — IMO0002 Reserved for concepts with insufficient information to code with codable children: Secondary | ICD-10-CM

## 2015-03-20 DIAGNOSIS — Z79899 Other long term (current) drug therapy: Secondary | ICD-10-CM | POA: Diagnosis not present

## 2015-03-20 DIAGNOSIS — E785 Hyperlipidemia, unspecified: Secondary | ICD-10-CM

## 2015-03-20 DIAGNOSIS — E1142 Type 2 diabetes mellitus with diabetic polyneuropathy: Secondary | ICD-10-CM | POA: Diagnosis not present

## 2015-03-20 DIAGNOSIS — Z794 Long term (current) use of insulin: Secondary | ICD-10-CM | POA: Diagnosis not present

## 2015-03-20 DIAGNOSIS — M8580 Other specified disorders of bone density and structure, unspecified site: Secondary | ICD-10-CM | POA: Diagnosis not present

## 2015-03-20 DIAGNOSIS — E042 Nontoxic multinodular goiter: Secondary | ICD-10-CM

## 2015-03-20 LAB — COMPREHENSIVE METABOLIC PANEL
ALK PHOS: 76 U/L (ref 39–117)
ALT: 23 U/L (ref 0–35)
AST: 25 U/L (ref 0–37)
Albumin: 3.6 g/dL (ref 3.5–5.2)
BUN: 37 mg/dL — ABNORMAL HIGH (ref 6–23)
CALCIUM: 9 mg/dL (ref 8.4–10.5)
CO2: 26 meq/L (ref 19–32)
Chloride: 103 mEq/L (ref 96–112)
Creatinine, Ser: 1.15 mg/dL (ref 0.40–1.20)
GFR: 48.11 mL/min — AB (ref >60.00)
Glucose, Bld: 205 mg/dL — ABNORMAL HIGH (ref 70–99)
Potassium: 4.8 mEq/L (ref 3.5–5.1)
Sodium: 139 mEq/L (ref 135–145)
Total Bilirubin: 0.4 mg/dL (ref 0.2–1.2)
Total Protein: 6.8 g/dL (ref 6.0–8.3)

## 2015-03-20 LAB — MICROALBUMIN / CREATININE URINE RATIO
CREATININE, U: 92.9 mg/dL
MICROALB/CREAT RATIO: 307.9 mg/g — AB (ref 0.0–30.0)
Microalb, Ur: 285.9 mg/dL — ABNORMAL HIGH (ref 0.0–1.9)

## 2015-03-20 LAB — POCT GLYCOSYLATED HEMOGLOBIN (HGB A1C): Hemoglobin A1C: 7.4

## 2015-03-20 LAB — LIPID PANEL
Cholesterol: 265 mg/dL — ABNORMAL HIGH (ref 0–200)
HDL: 66.1 mg/dL (ref >39.00)
Total CHOL/HDL Ratio: 4
Triglycerides: 451 mg/dL — ABNORMAL HIGH (ref 0.0–149.0)

## 2015-03-20 LAB — T4, FREE: FREE T4: 0.8 ng/dL (ref 0.60–1.60)

## 2015-03-20 LAB — TSH: TSH: 1.14 u[IU]/mL (ref 0.35–4.50)

## 2015-03-20 LAB — LDL CHOLESTEROL, DIRECT: Direct LDL: 152 mg/dL

## 2015-03-20 MED ORDER — HYDROCODONE-ACETAMINOPHEN 10-325 MG PO TABS: 1.0000 | ORAL_TABLET | Freq: Two times a day (BID) | ORAL | Status: DC | PRN

## 2015-03-20 NOTE — Progress Notes (Signed)
Patient ID: Andrea Wallace, female   DOB: December 09, 1931, 81 y.o.   MRN: [id number]   Reason for Appointment: Diabetes follow-up   History of Present Illness   Diagnosis: Type 2 DIABETES MELITUS, date of diagnosis 1999  Previous history: She has been on insulin for several years to control her diabetes and usually requires large doses Has been on basal bolus regimen for the last few years, taking Lantus twice a day.  Her blood sugars tend to fluctuate significantly but her A1c is usually at a reasonable level She does not clearly benefit from GLP-1 drugs and had been taking Byetta to help with postprandial hyperglycemia, this has been stopped  She had been tried on Victoza, Byetta and Invokana previously and these were  stopped because of unclear benefit       RECENT history  Insulin regimen: Toujeo 55 qd, Humalog 30 units at breakfast,  40-45 acs    Her blood sugars have been still poorly controlled especially recently because of pulmonary infections and recurrent doses of prednisone A1c is surprisingly better at 7.4  She tends to require relatively large doses of insulin at mealtimes without adequate control especially after supper Her A1c has been consistently around 8%  Current blood sugar patterns and problems identified:  She has had fairly good fasting reading recently but did have a high readings in the second week of October possibly from steroids  She is now starting to get occasional hypoglycemia waking up including this morning when it was 45; she also says that if her sugar is significantly high at bedtime she would take extra Humalog but did not do any last night  Blood sugars are consistently high before supper time now since she has been on steroids and were relatively better only about 3 weeks ago  Blood sugars are recently also mostly high at bedtime, somewhat better this week  She is still not checking readings around lunchtime, has a couple of good  readings but has not checked in 10 days  She thinks she is remembering to do her insulin before eating more consistently  She was advised to reduce Toujeo to 50 units because a low-normal sugars on the last visit but is still taking 55  Currently taking only 500 mg of metformin ER in the evening, she was supposed to increase this to twice a day as renal function was normal on the last visit but she did not do so  Blood sugar readings by  download of her FreeStyle monitor show the following:  Mean values apply above for all meters except median for One Touch  PRE-MEAL Fasting Lunch Dinner Bedtime Overall  Glucose range: 45-154  96, 116   88-366   67-288    Mean/median:  120    210   172   163        Oral hypoglycemic drugs: Metformin ER, 500 mg 1 a day       Side effects from medications: None  Proper timing of medications in relation to meals: Yes.         Monitors blood glucose:  2-3 times a day on average.    Glucometer: Freestyle          Meals: 3 meals per day.  Nutrisystem usually; at breakfast she is eating a  Peanut butter toast.  Protein bar at lunch, small portions at supper         Physical activity: exercise: some on the exercise bike  Dietician visit: Most recent: Years ago       Complications: are: Neuropathy, microalbuminuria     Wt Readings from Last 3 Encounters:  03/20/15 174 lb (78.926 kg)  12/18/14 169 lb 9.6 oz (76.93 kg)  10/13/14 170 lb (77.111 kg)     Lab Results  Component Value Date   HGBA1C 7.4 03/20/2015   HGBA1C 7.9* 12/18/2014   HGBA1C 7.9* 09/20/2014   Lab Results  Component Value Date   MICROALBUR 87.2* 09/20/2014   LDLCALC 32 12/05/2013   CREATININE 1.19 12/18/2014       Medication List       This list is accurate as of: 03/20/15 12:57 PM.  Always use your most recent med list.               aspirin 81 MG tablet  Take 81 mg by mouth daily.     betamethasone dipropionate 0.05 % cream  Commonly known as:  DIPROLENE       cefdinir 300 MG capsule  Commonly known as:  OMNICEF  Take by mouth.     CRESTOR 40 MG tablet  Generic drug:  rosuvastatin  Take 40 mg by mouth daily.     DULoxetine 60 MG capsule  Commonly known as:  CYMBALTA  60 mg.     fenofibrate 145 MG tablet  Commonly known as:  TRICOR  Take 1 tablet (145 mg total) by mouth every evening.     fenofibrate 145 MG tablet  Commonly known as:  TRICOR  TAKE ONE TABLET BY MOUTH IN THE EVENING     FLECTOR 1.3 % Ptch  Generic drug:  diclofenac     gabapentin 600 MG tablet  Commonly known as:  NEURONTIN  TAKE ONE TABLET BY MOUTH THREE TIMES DAILY     glucose blood test strip  Commonly known as:  FREESTYLE LITE  USE AS INSTRUCTED TO CHECK BLOOD SUGARS THREE TIMES PER DAY. Dx code E11.65     HUMALOG 100 UNIT/ML injection  Generic drug:  insulin lispro  INJECT 40 UNITS SQ BEFORE BREAKFAST, 30 UNITS SQ BEFORE LUNCH AND 45 UNITS SQ BEFORE SUPPER     HYDROcodone-acetaminophen 10-325 MG tablet  Commonly known as:  NORCO  Take 1 tablet by mouth 2 (two) times daily as needed for moderate pain.     Insulin Pen Needle 31G X 5 MM Misc  Commonly known as:  B-D UF III MINI PEN NEEDLES  Use 3 pen needles per day     Insulin Syringe-Needle U-100 30G X 5/16" 0.5 ML Misc  1 each by Does not apply route 4 (four) times daily.     RELION INSULIN SYR .3CC/29G 29G X 1/2" 0.3 ML Misc  Generic drug:  Insulin Syringe-Needle U-100     metFORMIN 500 MG 24 hr tablet  Commonly known as:  GLUCOPHAGE-XR     montelukast 10 MG tablet  Commonly known as:  SINGULAIR  Take 10 mg by mouth at bedtime.     multivitamin with minerals Tabs tablet  Take 1 tablet by mouth daily.     neomycin-polymyxin b-dexamethasone 3.5-10000-0.1 Oint  Commonly known as:  MAXITROL     omeprazole 20 MG capsule  Commonly known as:  PRILOSEC  Take 20 mg by mouth daily as needed. For acid reflux     predniSONE 10 MG tablet  Commonly known as:  DELTASONE  6 tabs x 1 day, 5 tabs  x 1 day, 4 tabs x 1 day, etc..Marland Kitchen  propranolol ER 120 MG 24 hr capsule  Commonly known as:  INDERAL LA  Take 120 mg by mouth daily.     TOUJEO SOLOSTAR 300 UNIT/ML Sopn  Generic drug:  Insulin Glargine  INJECT 60 UNITS SUBCUTANEOUSLY ONCE DAILY BEFORE BREAKFAST     traMADol 50 MG tablet  Commonly known as:  ULTRAM  Take 1 tablet (50 mg total) by mouth every 12 (twelve) hours as needed.     VITAMIN B-12 PO  Take 1 tablet by mouth daily.     VITAMIN D PO  Take 1 tablet by mouth daily.        Allergies:  Allergies  Allergen Reactions  . Codeine Sulfate Other (See Comments)  . Etodolac Other (See Comments)  . Oxycodone-Acetaminophen Other (See Comments)  . Sulfa Antibiotics     Other reaction(s): RASH  . Sulfamethoxazole-Trimethoprim Other (See Comments)    Past Medical History  Diagnosis Date  . Anemia   . Arthritis   . Blood transfusion   . Asthma   . Diabetes mellitus   . Hyperlipidemia   . Neuromuscular disorder ([place])   . Generalized headaches   . Nasal congestion   . Cough   . Cancer ([place])     bilateral breast  . Allergy   . Complication of anesthesia     diff  waking up  . Hypertension     dr Ouida Sills  . Breast cancer [facility]) 2011    Right    Past Surgical History  Procedure Laterality Date  . Total knee arthroplasty    . Shoulder arthroscopy      bilateral  . Breast surgery  06/2007    right mastectomy  . Breast surgery  2011    left  . Kidney stone surgery    . Back surgery    . Foot surgery    . Mastectomy Right 2011    with Radiaiton  . Reduction mammaplasty Left 2012    Family History  Problem Relation Age of Onset  . Osteoporosis Mother   . Heart disease Father   . Cancer Brother   . Breast cancer Brother   . Heart disease Sister     Social History:  reports that she quit smoking about 31 years ago. She has never used smokeless tobacco. She reports that she drinks alcohol. She reports that she does not use illicit  drugs.  Review of Systems:  She has had various fractures including her arm recently and not clear if she has osteoporosis.  No recent bone density available and was told to schedule it with PCP previously  Chronic kidney disease: Creatinine has been  upper normal, also followed periodically by nephrologist Recently creatinine has been better, has not had any follow-up levels elsewhere Losartan was stopped in 4/16 because of high potassium and mild increase in creatinine as well as low normal blood pressure Blood pressure is normal today  Lab Results  Component Value Date   CREATININE 1.19 12/18/2014    HYPERLIPIDEMIA: The lipid abnormality consists of elevated  triglycerides, has been treated with TriCor.  Lab Results  Component Value Date   CHOL 124 06/07/2014   HDL 33.30* 06/07/2014   LDLCALC 32 12/05/2013   LDLDIRECT 61.0 06/07/2014   TRIG 245.0* 06/07/2014   CHOLHDL 4 06/07/2014     She has chronic pains and paresthesia in her feet and lower legs She is asking for refill today for her hydrocodone   Also on gabapentin 600 mg and Cymbalta,  she thinks gabapentin helps  However now she is asking about some tingling and numbness in her fingers also  Last diabetic foot exam was in 02/2014 with the following findings Decreased/ absent monofilament sensation on her toes and distal plantar surfaces.  Pedal pulses normal on the left and absent on the right Deformities of left second and third toes present  She has had a multinodular goiter, this has been euthyroid  Lab Results  Component Value Date   TSH 2.30 06/07/2014        Examination:   BP 128/60 mmHg  Pulse 69  Temp(Src) 98.8 F (37.1 C)  Resp 16  Ht 5\' 5"  (1.651 m)  Wt 174 lb (78.926 kg)  BMI 28.96 kg/m2  SpO2 96%  Body mass index is 28.96 kg/(m^2).   No pedal edema Thyroid barely palpable on the right side on swallowing, left side not palpable  ASSESSMENT/ PLAN:   Diabetes type 2:  The patient's  diabetes control is still difficult especially with postprandial hyperglycemia at night See history of present illness for detailed discussion of his current management, blood sugar patterns and problems identified Her fasting readings are averaging about 140 and relatively stable with using Toujeo once a day Not clear why her sugars were higher in the mornings in the last few days, possibly from relatively high readings late at night also   She  maybe occasionally forgetting her  mealtime insulin because of memory issues Also not clear why she has periodic high readings before supper even without any food intake during the day  A1c has been around 8% more recently, will check again today  Recommendations: Reduce Toujeo to 50 units, discussed adjusting this periodically based on fasting glucose Increase Humalog again if blood sugars start going up after supper Take at least 10 units Humalog with midday snack to avoid high sugar that suppertime while taking prednisone Metformin twice a day Check blood sugars more consistently in the early afternoon Balanced meals with protein especially at suppertime No Humalog at bedtime  CKD/nephropathy: Creatinine has been better recently and will check this again.  NEUROPATHY: She continues to have chronic leg pains taking chronic narcotic analgesics.   Also taking Cymbalta and gabapentin with some relief.  Hydrocodone 10 mg prescription, 100 tablets given and she continues to get this from our office, is not getting it from other physicians   OSTEOPOROSIS:  She was again reminded to have bone density done through PCP; likely has osteoporosis because of history of multiple fractures and also now is getting prednisone   Patient Instructions  Reduce Toujeo to 50 units  Must take 10 Humalog midday while on prednisone with snack, more sugars at noon-1 pm  NO Humalog at bedtime  Get bone density  Metformin 2x daiily     Counseling time on  subjects discussed above is over 50% of today's 25 minute visit  Izaan Kingbird 03/20/2015, 12:57 PM

## 2015-03-20 NOTE — Patient Instructions (Addendum)
Reduce Toujeo to 50 units  Must take 10 Humalog midday while on prednisone with snack, more sugars at noon-1 pm  NO Humalog at bedtime  Get bone density  Metformin 2x daiily

## 2015-03-22 ENCOUNTER — Other Ambulatory Visit: Payer: Self-pay | Admitting: *Deleted

## 2015-03-22 MED ORDER — METFORMIN HCL ER 500 MG PO TB24: ORAL_TABLET | ORAL | Status: DC

## 2015-03-22 MED ORDER — ATORVASTATIN CALCIUM 10 MG PO TABS: 10.0000 mg | ORAL_TABLET | Freq: Every day | ORAL | Status: DC

## 2015-03-22 NOTE — Progress Notes (Signed)
Quick Note:  Please let patient know that her cholesterol is much higher than usual, may be from steroids but need to start Lipitor 10 mg daily Kidney test is normal, stay on metformin twice a day, need to send labs to PCP ______

## 2015-04-04 ENCOUNTER — Other Ambulatory Visit: Payer: Self-pay | Admitting: *Deleted

## 2015-04-04 ENCOUNTER — Other Ambulatory Visit: Payer: Self-pay | Admitting: Internal Medicine

## 2015-04-04 DIAGNOSIS — R053 Chronic cough: Secondary | ICD-10-CM

## 2015-04-04 DIAGNOSIS — R05 Cough: Secondary | ICD-10-CM

## 2015-04-04 MED ORDER — INSULIN LISPRO 100 UNIT/ML ~~LOC~~ SOLN: SUBCUTANEOUS | Status: DC

## 2015-04-10 ENCOUNTER — Other Ambulatory Visit: Payer: Self-pay | Admitting: *Deleted

## 2015-04-10 MED ORDER — INSULIN LISPRO 100 UNIT/ML ~~LOC~~ SOLN: SUBCUTANEOUS | Status: DC

## 2015-04-11 ENCOUNTER — Ambulatory Visit
Admission: RE | Admit: 2015-04-11 | Discharge: 2015-04-11 | Disposition: A | Discharge status: Home / Self Care | Payer: Medicare Other | Source: Ambulatory Visit | Attending: Internal Medicine | Admitting: Internal Medicine

## 2015-04-11 DIAGNOSIS — R053 Chronic cough: Secondary | ICD-10-CM

## 2015-04-11 DIAGNOSIS — R911 Solitary pulmonary nodule: Secondary | ICD-10-CM | POA: Insufficient documentation

## 2015-04-11 DIAGNOSIS — E041 Nontoxic single thyroid nodule: Secondary | ICD-10-CM | POA: Diagnosis not present

## 2015-04-11 DIAGNOSIS — I358 Other nonrheumatic aortic valve disorders: Secondary | ICD-10-CM | POA: Insufficient documentation

## 2015-04-11 DIAGNOSIS — R05 Cough: Secondary | ICD-10-CM | POA: Insufficient documentation

## 2015-04-11 DIAGNOSIS — I2510 Atherosclerotic heart disease of native coronary artery without angina pectoris: Secondary | ICD-10-CM | POA: Diagnosis not present

## 2015-04-12 ENCOUNTER — Other Ambulatory Visit: Payer: Self-pay | Admitting: Specialist

## 2015-04-12 DIAGNOSIS — R911 Solitary pulmonary nodule: Secondary | ICD-10-CM

## 2015-04-17 ENCOUNTER — Ambulatory Visit: Admission: RE | Admit: 2015-04-17 | Payer: Medicare Other | Source: Ambulatory Visit

## 2015-04-23 ENCOUNTER — Encounter
Admission: RE | Admit: 2015-04-23 | Discharge: 2015-04-23 | Disposition: A | Discharge status: Home / Self Care | Payer: Medicare Other | Source: Ambulatory Visit | Attending: Specialist | Admitting: Specialist

## 2015-04-23 DIAGNOSIS — R911 Solitary pulmonary nodule: Secondary | ICD-10-CM | POA: Insufficient documentation

## 2015-04-23 LAB — GLUCOSE, CAPILLARY: Glucose-Capillary: 101 mg/dL — ABNORMAL HIGH (ref 65–99)

## 2015-04-23 MED ORDER — FLUDEOXYGLUCOSE F - 18 (FDG) INJECTION
12.1200 | Freq: Once | INTRAVENOUS | Status: AC | PRN
  Administered 2015-04-23: 12.12 via INTRAVENOUS

## 2015-04-25 ENCOUNTER — Inpatient Hospital Stay: Payer: Medicare Other | Attending: Cardiothoracic Surgery | Admitting: Cardiothoracic Surgery

## 2015-04-25 ENCOUNTER — Encounter: Payer: Self-pay | Admitting: Cardiothoracic Surgery

## 2015-04-25 VITALS — BP 133/71 | HR 89 | Temp 98.2°F | Ht 65.0 in | Wt 171.4 lb

## 2015-04-25 DIAGNOSIS — E1165 Type 2 diabetes mellitus with hyperglycemia: Secondary | ICD-10-CM | POA: Diagnosis not present

## 2015-04-25 DIAGNOSIS — I10 Essential (primary) hypertension: Secondary | ICD-10-CM | POA: Diagnosis not present

## 2015-04-25 DIAGNOSIS — Z923 Personal history of irradiation: Secondary | ICD-10-CM | POA: Insufficient documentation

## 2015-04-25 DIAGNOSIS — Z87891 Personal history of nicotine dependence: Secondary | ICD-10-CM | POA: Diagnosis not present

## 2015-04-25 DIAGNOSIS — R911 Solitary pulmonary nodule: Secondary | ICD-10-CM | POA: Diagnosis present

## 2015-04-25 DIAGNOSIS — E785 Hyperlipidemia, unspecified: Secondary | ICD-10-CM | POA: Insufficient documentation

## 2015-04-25 DIAGNOSIS — Z7982 Long term (current) use of aspirin: Secondary | ICD-10-CM | POA: Insufficient documentation

## 2015-04-25 DIAGNOSIS — Z853 Personal history of malignant neoplasm of breast: Secondary | ICD-10-CM | POA: Diagnosis not present

## 2015-04-25 DIAGNOSIS — R918 Other nonspecific abnormal finding of lung field: Secondary | ICD-10-CM

## 2015-04-25 DIAGNOSIS — Z9011 Acquired absence of right breast and nipple: Secondary | ICD-10-CM | POA: Insufficient documentation

## 2015-04-25 DIAGNOSIS — Z79899 Other long term (current) drug therapy: Secondary | ICD-10-CM | POA: Diagnosis not present

## 2015-04-25 DIAGNOSIS — J45909 Unspecified asthma, uncomplicated: Secondary | ICD-10-CM | POA: Diagnosis not present

## 2015-04-25 DIAGNOSIS — Z794 Long term (current) use of insulin: Secondary | ICD-10-CM | POA: Insufficient documentation

## 2015-04-25 DIAGNOSIS — R05 Cough: Secondary | ICD-10-CM | POA: Insufficient documentation

## 2015-04-25 NOTE — Progress Notes (Signed)
Patient ID: Andrea Wallace, female   DOB: 14-Aug-1931, 81 y.o.   MRN: [id number]  Chief Complaint  Patient presents with  . Lung Mass    referral [facility]    Referred By Dr. Raul Del Reason for Referral left lower lobe mass  HPI Location, Quality, Duration, Severity, Timing, Context, Modifying Factors, Associated Signs and Symptoms.  Andrea Wallace is a 81 y.o. female.  This patient is an 81 year old woman who presented to Dr. Frazier Richards with a chief complaint of cough and shortness of breath. Dr. Ouida Sills obtained a CT scan of the chest which revealed a left lower lobe nodule. This measured about 1 cm in size. There are no prior imaging studies to compare this to. Dr. Ouida Sills then referred the patient to Dr. Raul Del where a pulmonary function test was ordered but was not performed and a PET scan was obtained revealing slight uptake in the left lower lobe pulmonary nodule as well as some borderline uptake in the mediastinal nodes. The patient has a history of extensive coronary atherosclerosis as well as mitral and aortic valve calcifications. She states that her shortness of breath is mostly whenever she walks or does anything on exertion. She also complains of some back surgery. About 6 years ago she had a right mastectomy followed by radiation therapy. She states she did not receive any chemotherapy. She has had no further follow-up with anyone here for that particular problem. She has a smoking history but quit over 30 years ago. Prior then she smoked about a half pack cigarettes a day. She is a social alcohol drinker.   Past Medical History  Diagnosis Date  . Anemia   . Arthritis   . Blood transfusion   . Asthma   . Diabetes mellitus   . Hyperlipidemia   . Neuromuscular disorder ([place])   . Generalized headaches   . Nasal congestion   . Cough   . Cancer ([place])     bilateral breast  . Allergy   . Complication of anesthesia     diff  waking up  . Hypertension     dr Ouida Sills   . Breast cancer [facility]) 2011    Right    Past Surgical History  Procedure Laterality Date  . Total knee arthroplasty    . Shoulder arthroscopy      bilateral  . Breast surgery  06/2007    right mastectomy  . Breast surgery  2011    left  . Kidney stone surgery    . Back surgery    . Foot surgery    . Mastectomy Right 2011    with Radiaiton  . Reduction mammaplasty Left 2012    Family History  Problem Relation Age of Onset  . Osteoporosis Mother   . Heart disease Father   . Cancer Brother   . Breast cancer Brother   . Heart disease Sister     Social History Social History  Substance Use Topics  . Smoking status: Former Smoker    Quit date: 03/15/1984  . Smokeless tobacco: Never Used  . Alcohol Use: Yes     Comment: occ    Allergies  Allergen Reactions  . Codeine Sulfate Other (See Comments)  . Etodolac Other (See Comments)  . Oxycodone-Acetaminophen Other (See Comments)  . Sulfamethoxazole-Trimethoprim Other (See Comments)    Current Outpatient Prescriptions  Medication Sig Dispense Refill  . aspirin 81 MG tablet Take 81 mg by mouth daily.    . betamethasone dipropionate (DIPROLENE) 0.05 %  cream     . Cholecalciferol (VITAMIN D PO) Take 1 tablet by mouth daily.    . CRESTOR 40 MG tablet Take 40 mg by mouth daily.     . Cyanocobalamin (VITAMIN B-12 PO) Take 1 tablet by mouth daily.    . DULoxetine (CYMBALTA) 60 MG capsule 60 mg.    . gabapentin (NEURONTIN) 600 MG tablet TAKE ONE TABLET BY MOUTH THREE TIMES DAILY 90 tablet 3  . glucose blood (FREESTYLE LITE) test strip USE AS INSTRUCTED TO CHECK BLOOD SUGARS THREE TIMES PER DAY. Dx code E11.65 100 each 5  . HYDROcodone-acetaminophen (NORCO) 10-325 MG tablet Take 1 tablet by mouth 2 (two) times daily as needed for moderate pain. 100 tablet 0  . insulin lispro (HUMALOG) 100 UNIT/ML injection INJECT 40 UNITS SQ BEFORE BREAKFAST, 30 UNITS SQ BEFORE LUNCH AND 45 UNITS SQ BEFORE SUPPER 40 mL 3  . Insulin Pen Needle  (B-D UF III MINI PEN NEEDLES) 31G X 5 MM MISC Use 3 pen needles per day 100 each 11  . Insulin Syringe-Needle U-100 30G X 5/16" 0.5 ML MISC 1 each by Does not apply route 4 (four) times daily. 150 each 5  . omeprazole (PRILOSEC) 20 MG capsule Take 20 mg by mouth daily.    . predniSONE (DELTASONE) 10 MG tablet 6 tabs x 1 day, 5 tabs x 1 day, 4 tabs x 1 day, etc...    . propranolol (INDERAL LA) 120 MG 24 hr capsule Take 120 mg by mouth daily.     Marland Kitchen RELION INSULIN SYR .3CC/29G 29G X 1/2" 0.3 ML MISC     . TOUJEO SOLOSTAR 300 UNIT/ML SOPN INJECT 60 UNITS SUBCUTANEOUSLY ONCE DAILY BEFORE BREAKFAST 6 pen 3  . montelukast (SINGULAIR) 10 MG tablet Take 10 mg by mouth at bedtime.     . Multiple Vitamin (MULTIVITAMIN WITH MINERALS) TABS Take 1 tablet by mouth daily.     No current facility-administered medications for this visit.      Review of Systems A complete review of systems was asked and was negative except for the following positive findings weight gain secondary to prednisone usage, frequent cough, shortness of breath, joint pain, joint stiffness, headache, anxiety, he fever.  Blood pressure 133/71, pulse 89, temperature 98.2 F (36.8 C), temperature source Oral, height 5\' 5"  (1.651 m), weight 171 lb 6.5 oz (77.75 kg), SpO2 93 %.  Physical Exam CONSTITUTIONAL:  Pleasant, well-developed, well-nourished, and in no acute distress. EYES: Pupils equal and reactive to light, Sclera non-icteric EARS, NOSE, MOUTH AND THROAT:  The oropharynx was clear.  Dentition is good repair.  Oral mucosa pink and moist. LYMPH NODES:  Lymph nodes in the neck and axillae were normal RESPIRATORY:  Lungs were clear.  Normal respiratory effort without pathologic use of accessory muscles of respiration CARDIOVASCULAR: Heart was regular without murmurs.  There were no carotid bruits. GI: The abdomen was soft, nontender, and nondistended. There were no palpable masses. There was no hepatosplenomegaly. There were normal  bowel sounds in all quadrants. GU:  Rectal deferred.   MUSCULOSKELETAL:  Normal muscle strength and tone.  No clubbing or cyanosis.   SKIN:  There were no pathologic skin lesions.  There were no nodules on palpation. NEUROLOGIC:  Sensation is normal.  Cranial nerves are grossly intact. PSYCH:  Oriented to person, place and time.  Mood and affect are normal.  Data Reviewed CT scan and PET scan  I have personally reviewed the patient's imaging, laboratory findings and medical  records.    Assessment    I have had an opportunity review the CT scan and PET scan. In addition I personally reviewed these films with Dr. Aletta Edouard in radiology. The lesion in the left lower lobe is somewhat smooth bordered and may be a carcinoid lesion. It does not have a typical appearance of a bronchogenic carcinoma. The uptake is equivocal on the scans but was positive on the initial reading.  I had a long discussion with her. He did not believe that the lesion in the left lower lobe was amenable to percutaneous biopsy. I told her I thought that perhaps endobronchial navigation may get [place] an answer. I also told her to be reasonable to just come back in 3 months time to repeat the CT scan. Final read offer her surgery if she were a candidate based upon pulmonary functions and a complete cardiac evaluation. She does have extensive underlying comorbid conditions including her obesity and diabetes. She is not anxious to pursue a very aggressive surgical approach.    Plan    I asked the patient to review her options with her friends and family. I asked her to discuss her options with Dr. Frazier Richards who she has a great deal of respect for. I told her I thought Dr. Frazier Richards could help her make her mind as to how best to proceed. She will contact my office once she's had an opportunity review her options with her friends and family. I gave her my business card and told her to contact me as soon as she is  able to make up her mind.       Nestor Lewandowsky, MD 04/25/2015, 2:41 PM

## 2015-04-25 NOTE — Progress Notes (Signed)
Patient here for initial visit. Persistent cough for over 2 months. Cough is non productive no blood in sputum.

## 2015-04-26 ENCOUNTER — Encounter: Payer: Self-pay | Admitting: *Deleted

## 2015-04-26 NOTE — Progress Notes (Signed)
  Oncology Nurse Navigator Documentation    Navigator Encounter Type: Clinic/MDC (04/26/15 0700) Patient Visit Type: Surgery;Initial (04/26/15 0700)                    Time Spent with Patient: 30 (04/26/15 0700)   Met with patient at thoracic surgery appointment. Introduced Programmer, multimedia and reviewed plan of care. Will follow.

## 2015-05-07 ENCOUNTER — Inpatient Hospital Stay (HOSPITAL_BASED_OUTPATIENT_CLINIC_OR_DEPARTMENT_OTHER): Payer: Medicare Other | Admitting: Oncology

## 2015-05-07 DIAGNOSIS — R911 Solitary pulmonary nodule: Secondary | ICD-10-CM

## 2015-05-07 DIAGNOSIS — Z853 Personal history of malignant neoplasm of breast: Secondary | ICD-10-CM | POA: Diagnosis not present

## 2015-05-07 DIAGNOSIS — Z79899 Other long term (current) drug therapy: Secondary | ICD-10-CM

## 2015-05-07 DIAGNOSIS — E1165 Type 2 diabetes mellitus with hyperglycemia: Secondary | ICD-10-CM

## 2015-05-07 DIAGNOSIS — R05 Cough: Secondary | ICD-10-CM | POA: Diagnosis not present

## 2015-05-07 DIAGNOSIS — Z923 Personal history of irradiation: Secondary | ICD-10-CM

## 2015-05-07 DIAGNOSIS — Z9011 Acquired absence of right breast and nipple: Secondary | ICD-10-CM

## 2015-05-07 NOTE — Progress Notes (Signed)
Patient has history of breast cancer about 7 years ago and she developed a cough that would not improve so a CT scan was performed.  There was a abnormality on the CT so a PET was performed and she has been evaluated by Dr. Genevive Bi.

## 2015-05-21 ENCOUNTER — Telehealth: Payer: Self-pay | Admitting: *Deleted

## 2015-05-21 NOTE — Telephone Encounter (Signed)
Per patient request, had Dr. Mortimer Fries review images, and notified patient that she can wait 3 months and repeat scan as planned or we can biopsy questionable lymph nodes with ebus now. She will think about it and let me know if she desires to do anything prior to scheduled followup.

## 2015-05-25 NOTE — Progress Notes (Signed)
[place]  Telephone:([phone]) [phone] [phone]) [phone]  ID: Dolly Rias OB: [birth year]  MR#: [id number]  [id number]  Patient Care Team: Kirk Ruths, MD as PCP - General (Unknown Physician Specialty)  CHIEF COMPLAINT:  Chief Complaint  Patient presents with  . Lung Mass    INTERVAL HISTORY: Patient is an 81 year old female with a distant history of breast cancer who presented to her primary care physician with persistent cough. Subsequent workup included CT and PET scan which revealed an approximately 1 cm lesion suspicious for underlying malignancy. Currently, she feels well and is asymptomatic. She continues to have cough, but denies chest pain or shortness of breath. She has no neurologic complaints. She denies any recent fevers. She denies any weight loss. She has no nausea, vomiting, constipation, or diarrhea. She has no urinary complaints. Patient otherwise feels well and offers no further specific complaints.  REVIEW OF SYSTEMS:   Review of Systems  Constitutional: Positive for malaise/fatigue. Negative for fever.  Respiratory: Positive for cough. Negative for sputum production and shortness of breath.   Cardiovascular: Negative.  Negative for chest pain.  Gastrointestinal: Negative.   Musculoskeletal: Negative.   Neurological: Positive for weakness.    As per HPI. Otherwise, a complete review of systems is negatve.  PAST MEDICAL HISTORY: Past Medical History  Diagnosis Date  . Anemia   . Arthritis   . Blood transfusion   . Asthma   . Diabetes mellitus   . Hyperlipidemia   . Neuromuscular disorder ([place])   . Generalized headaches   . Nasal congestion   . Cough   . Cancer ([place])     bilateral breast  . Allergy   . Complication of anesthesia     diff  waking up  . Hypertension     dr Ouida Sills  . Breast cancer [facility]) 2011    Right    PAST SURGICAL HISTORY: Past Surgical History  Procedure Laterality Date  . Total knee  arthroplasty    . Shoulder arthroscopy      bilateral  . Breast surgery  06/2007    right mastectomy  . Breast surgery  2011    left  . Kidney stone surgery    . Back surgery    . Foot surgery    . Mastectomy Right 2011    with Radiaiton  . Reduction mammaplasty Left 2012    FAMILY HISTORY Family History  Problem Relation Age of Onset  . Osteoporosis Mother   . Heart disease Father   . Cancer Brother   . Breast cancer Brother   . Heart disease Sister        ADVANCED DIRECTIVES:    HEALTH MAINTENANCE: Social History  Substance Use Topics  . Smoking status: Former Smoker    Quit date: 03/15/1984  . Smokeless tobacco: Never Used  . Alcohol Use: Yes     Comment: occ     Colonoscopy:  PAP:  Bone density:  Lipid panel:  Allergies  Allergen Reactions  . Codeine Sulfate Other (See Comments)  . Etodolac Other (See Comments)  . Oxycodone-Acetaminophen Other (See Comments)  . Sulfamethoxazole-Trimethoprim Other (See Comments)    Current Outpatient Prescriptions  Medication Sig Dispense Refill  . aspirin 81 MG tablet Take 81 mg by mouth daily.    . betamethasone dipropionate (DIPROLENE) 0.05 % cream     . Cholecalciferol (VITAMIN D PO) Take 1 tablet by mouth daily.    . CRESTOR 40 MG tablet Take 40 mg by  mouth daily.     . Cyanocobalamin (VITAMIN B-12 PO) Take 1 tablet by mouth daily.    . DULoxetine (CYMBALTA) 60 MG capsule 60 mg.    . gabapentin (NEURONTIN) 600 MG tablet TAKE ONE TABLET BY MOUTH THREE TIMES DAILY 90 tablet 3  . glucose blood (FREESTYLE LITE) test strip USE AS INSTRUCTED TO CHECK BLOOD SUGARS THREE TIMES PER DAY. Dx code E11.65 100 each 5  . HYDROcodone-acetaminophen (NORCO) 10-325 MG tablet Take 1 tablet by mouth 2 (two) times daily as needed for moderate pain. 100 tablet 0  . insulin lispro (HUMALOG) 100 UNIT/ML injection INJECT 40 UNITS SQ BEFORE BREAKFAST, 30 UNITS SQ BEFORE LUNCH AND 45 UNITS SQ BEFORE SUPPER 40 mL 3  . Insulin Pen Needle (B-D  UF III MINI PEN NEEDLES) 31G X 5 MM MISC Use 3 pen needles per day 100 each 11  . Insulin Syringe-Needle U-100 30G X 5/16" 0.5 ML MISC 1 each by Does not apply route 4 (four) times daily. 150 each 5  . montelukast (SINGULAIR) 10 MG tablet Take 10 mg by mouth at bedtime.     . Multiple Vitamin (MULTIVITAMIN WITH MINERALS) TABS Take 1 tablet by mouth daily.    Marland Kitchen omeprazole (PRILOSEC) 20 MG capsule Take 20 mg by mouth daily.    . predniSONE (DELTASONE) 10 MG tablet 6 tabs x 1 day, 5 tabs x 1 day, 4 tabs x 1 day, etc...    . propranolol (INDERAL LA) 120 MG 24 hr capsule Take 120 mg by mouth daily.     Marland Kitchen RELION INSULIN SYR .3CC/29G 29G X 1/2" 0.3 ML MISC     . TOUJEO SOLOSTAR 300 UNIT/ML SOPN INJECT 60 UNITS SUBCUTANEOUSLY ONCE DAILY BEFORE BREAKFAST 6 pen 3   No current facility-administered medications for this visit.    OBJECTIVE: There were no vitals filed for this visit.   There is no weight on file to calculate BMI.    ECOG FS:1 - Symptomatic but completely ambulatory  General: Well-developed, well-nourished, no acute distress. Eyes: Pink conjunctiva, anicteric sclera. HEENT: Normocephalic, moist mucous membranes, clear oropharnyx. Lungs: Scattered wheezing throughout. Heart: Regular rate and rhythm. No rubs, murmurs, or gallops. Abdomen: Soft, nontender, nondistended. No organomegaly noted, normoactive bowel sounds. Musculoskeletal: No edema, cyanosis, or clubbing. Neuro: Alert, answering all questions appropriately. Cranial nerves grossly intact. Skin: No rashes or petechiae noted. Psych: Normal affect. Lymphatics: No cervical, calvicular, axillary or inguinal LAD.   LAB RESULTS:  Lab Results  Component Value Date   NA 139 03/20/2015   K 4.8 03/20/2015   CL 103 03/20/2015   CO2 26 03/20/2015   GLUCOSE 205* 03/20/2015   BUN 37* 03/20/2015   CREATININE 1.15 03/20/2015   CALCIUM 9.0 03/20/2015   PROT 6.8 03/20/2015   ALBUMIN 3.6 03/20/2015   AST 25 03/20/2015   ALT 23  03/20/2015   ALKPHOS 76 03/20/2015   BILITOT 0.4 03/20/2015   GFRNONAA 47* 06/29/2012   GFRAA 54* 06/29/2012    Lab Results  Component Value Date   WBC 9.5 06/29/2012   NEUTROABS 6.8 06/21/2012   HGB 9.4* 06/29/2012   HCT 28.5* 06/29/2012   MCV 88.8 06/29/2012   PLT 141* 06/29/2012     STUDIES: No results found.  ASSESSMENT: Left lower lobe lung mass, history of breast cancer.  PLAN:    1. Lung mass: Case discussed extensively with Dr. Nestor Lewandowsky. CT and PET scan reviewed independently. This is unlikely a recurrence of her breast cancer. Given  the appearance on CT scan, this may be more consistent with a carcinoid rather than a bronchogenic carcinoma. Given patient's extensive comorbidities, she is not anxious to proceed with percutaneous biopsy. She also does not wish to undergo surgery at this time. We will continue to monitor closely and repeat CT scan in 3 months to assess for interval change. Patient will follow-up 1-2 days later to discuss the results and further evaluation. 2. History of breast cancer: Continue mammograms as ordered. We will get CA-27-29 in the near future. 3. Cough: Treatment per PCP. 4. Hyperglycemia: Continue current diabetic medications.  Approximately 45 minutes was spent in discussion of which greater than 50% was consultation.  Patient expressed understanding and was in agreement with this plan. She also understands that She can call clinic at any time with any questions, concerns, or complaints.    Lloyd Huger, MD   05/25/2015 8:47 AM

## 2015-05-31 ENCOUNTER — Other Ambulatory Visit: Payer: Self-pay | Admitting: *Deleted

## 2015-05-31 ENCOUNTER — Telehealth: Payer: Self-pay | Admitting: Endocrinology

## 2015-05-31 MED ORDER — INSULIN GLARGINE 300 UNIT/ML ~~LOC~~ SOPN: 60.0000 [IU] | PEN_INJECTOR | Freq: Every day | SUBCUTANEOUS | Status: DC

## 2015-05-31 NOTE — Telephone Encounter (Signed)
Patient need refill of Toujeo send to [facility] on [address]

## 2015-05-31 NOTE — Telephone Encounter (Signed)
rx sent

## 2015-06-20 ENCOUNTER — Encounter: Payer: Self-pay | Admitting: Endocrinology

## 2015-06-20 ENCOUNTER — Ambulatory Visit (INDEPENDENT_AMBULATORY_CARE_PROVIDER_SITE_OTHER): Payer: Medicare Other | Admitting: Endocrinology

## 2015-06-20 VITALS — BP 124/72 | HR 83 | Temp 98.0°F | Resp 14 | Ht 65.0 in | Wt 170.8 lb

## 2015-06-20 DIAGNOSIS — Z23 Encounter for immunization: Secondary | ICD-10-CM | POA: Diagnosis not present

## 2015-06-20 DIAGNOSIS — Z794 Long term (current) use of insulin: Secondary | ICD-10-CM | POA: Diagnosis not present

## 2015-06-20 DIAGNOSIS — E1142 Type 2 diabetes mellitus with diabetic polyneuropathy: Secondary | ICD-10-CM

## 2015-06-20 DIAGNOSIS — E782 Mixed hyperlipidemia: Secondary | ICD-10-CM | POA: Diagnosis not present

## 2015-06-20 DIAGNOSIS — E1165 Type 2 diabetes mellitus with hyperglycemia: Secondary | ICD-10-CM | POA: Diagnosis not present

## 2015-06-20 DIAGNOSIS — E118 Type 2 diabetes mellitus with unspecified complications: Secondary | ICD-10-CM

## 2015-06-20 DIAGNOSIS — IMO0002 Reserved for concepts with insufficient information to code with codable children: Secondary | ICD-10-CM

## 2015-06-20 LAB — POCT GLYCOSYLATED HEMOGLOBIN (HGB A1C): Hemoglobin A1C: 7.4

## 2015-06-20 MED ORDER — INSULIN REGULAR HUMAN (CONC) 500 UNIT/ML ~~LOC~~ SOPN: PEN_INJECTOR | SUBCUTANEOUS | Status: DC

## 2015-06-20 MED ORDER — HYDROCODONE-ACETAMINOPHEN 10-325 MG PO TABS: 1.0000 | ORAL_TABLET | Freq: Two times a day (BID) | ORAL | Status: DC | PRN

## 2015-06-20 NOTE — Progress Notes (Signed)
Patient ID: Andrea Wallace, female   DOB: Oct 15, 1931, 82 y.o.   MRN: [id number]   Reason for Appointment: Diabetes follow-up   History of Present Illness   Diagnosis: Type 2 DIABETES MELITUS, date of diagnosis 1999  Previous history: She has been on insulin for several years to control her diabetes and usually requires large doses Has been on basal bolus regimen for the last few years, taking Lantus twice a day.  Her blood sugars tend to fluctuate significantly but her A1c is usually at a reasonable level She does not clearly benefit from GLP-1 drugs and had been taking Byetta to help with postprandial hyperglycemia, this has been stopped  She had been tried on Victoza, Byetta and Invokana previously and these were  stopped because of unclear benefit       RECENT history  Insulin regimen: Toujeo 54 qd, Humalog 25-30 units at breakfast,  40 acs    Her blood sugars have been poorly controlled again at home on review of her home monitoring although A1c is still not any higher at 7.4  She tends to require relatively large doses of insulin at mealtimes without adequate control especially after supper She also states that she has had recurrent respiratory infections again  Current blood sugar patterns and problems identified:  She has had variable blood sugars even in the morning but has readings below 65 only twice in the last month  More recently blood sugars have been better in the morning and rarely over 200  She again does not check her blood sugar midday or afternoon and not clear what her readings are during the day  She is also unclear about whether she checks her sugar before or during her evening meal, overall most of these readings are higher around 7-8 PM  Blood sugars are also mostly high after supper with average over 200 and notably higher done on her last visit  This is despite her taking around 40 units of Humalog  She is adjusting her morning dose based on  her blood sugar level and may take as much as 40 units without getting hypoglycemic  She is again not eating at lunch and only a small snack without insulin coverage  She was advised to reduce Toujeo to 50 units on her last visit but since her sugars were higher she has gone up to 54  Fasting readings may not be better with decreasing metformin to twice a day since her last visit  She has had more stress lately  Blood sugar readings by  download of her FreeStyle monitor show the following:  Mean values apply above for all meters except median for One Touch  PRE-MEAL Fasting  3 PM   8 PM  Bedtime Overall  Glucose range:  59-252   154, 198   120-316   80-359    Mean/median:  135    211   221  183 +/-71     Oral hypoglycemic drugs: Metformin ER, 500 mg 2 a day       Side effects from medications: None  Proper timing of medications in relation to meals: Yes.         Monitors blood glucose:  2-3 times a day on average.    Glucometer: Freestyle          Meals: 3 meals per day.  Nutrisystem usually; at breakfast she is eating a  Peanut butter toast.  Protein bar at lunch, small portions at supper  Physical activity: exercise: none           Dietician visit: Most recent: Years ago       Complications: are: Neuropathy, microalbuminuria     Wt Readings from Last 3 Encounters:  06/20/15 170 lb 12.8 oz (77.474 kg)  04/25/15 171 lb 6.5 oz (77.75 kg)  03/20/15 174 lb (78.926 kg)     Lab Results  Component Value Date   HGBA1C 7.4 06/20/2015   HGBA1C 7.4 03/20/2015   HGBA1C 7.9* 12/18/2014   Lab Results  Component Value Date   MICROALBUR 285.9* 03/20/2015   LDLCALC 32 12/05/2013   CREATININE 1.15 03/20/2015   Creatinine level I.0 in 12/16    Medication List       This list is accurate as of: 06/20/15 12:40 PM.  Always use your most recent med list.               aspirin 81 MG tablet  Take 81 mg by mouth daily.     betamethasone dipropionate 0.05 % cream    Commonly known as:  DIPROLENE     CRESTOR 40 MG tablet  Generic drug:  rosuvastatin  Take 40 mg by mouth daily.     DULoxetine 60 MG capsule  Commonly known as:  CYMBALTA  60 mg.     gabapentin 600 MG tablet  Commonly known as:  NEURONTIN  TAKE ONE TABLET BY MOUTH THREE TIMES DAILY     glucose blood test strip  Commonly known as:  FREESTYLE LITE  USE AS INSTRUCTED TO CHECK BLOOD SUGARS THREE TIMES PER DAY. Dx code E11.65     HYDROcodone-acetaminophen 10-325 MG tablet  Commonly known as:  NORCO  Take 1 tablet by mouth 2 (two) times daily as needed for moderate pain.     Insulin Glargine 300 UNIT/ML Sopn  Commonly known as:  TOUJEO SOLOSTAR  Inject 60 Units into the skin daily.     insulin lispro 100 UNIT/ML injection  Commonly known as:  HUMALOG  INJECT 40 UNITS SQ BEFORE BREAKFAST, 30 UNITS SQ BEFORE LUNCH AND 45 UNITS SQ BEFORE SUPPER     Insulin Pen Needle 31G X 5 MM Misc  Commonly known as:  B-D UF III MINI PEN NEEDLES  Use 3 pen needles per day     insulin regular human CONCENTRATED 500 UNIT/ML kwikpen  Commonly known as:  HUMULIN R U-500 KWIKPEN  40 Units in am and 60 in pm, 30 minutes before each meal     Insulin Syringe-Needle U-100 30G X 5/16" 0.5 ML Misc  1 each by Does not apply route 4 (four) times daily.     RELION INSULIN SYR .3CC/29G 29G X 1/2" 0.3 ML Misc  Generic drug:  Insulin Syringe-Needle U-100     montelukast 10 MG tablet  Commonly known as:  SINGULAIR  Take 10 mg by mouth at bedtime.     multivitamin with minerals Tabs tablet  Take 1 tablet by mouth daily.     omeprazole 20 MG capsule  Commonly known as:  PRILOSEC  Take 20 mg by mouth daily.     predniSONE 10 MG tablet  Commonly known as:  DELTASONE  Reported on 06/20/2015     propranolol ER 120 MG 24 hr capsule  Commonly known as:  INDERAL LA  Take 120 mg by mouth daily.     VITAMIN B-12 PO  Take 1 tablet by mouth daily.     VITAMIN D PO  Take 1 tablet  by mouth daily.         Allergies:  Allergies  Allergen Reactions  . Codeine Sulfate Other (See Comments)  . Etodolac Other (See Comments)  . Oxycodone-Acetaminophen Other (See Comments)  . Sulfamethoxazole-Trimethoprim Other (See Comments)    Past Medical History  Diagnosis Date  . Anemia   . Arthritis   . Blood transfusion   . Asthma   . Diabetes mellitus   . Hyperlipidemia   . Neuromuscular disorder ([place])   . Generalized headaches   . Nasal congestion   . Cough   . Cancer ([place])     bilateral breast  . Allergy   . Complication of anesthesia     diff  waking up  . Hypertension     dr Ouida Sills  . Breast cancer [facility] 2011    Right    Past Surgical History  Procedure Laterality Date  . Total knee arthroplasty    . Shoulder arthroscopy      bilateral  . Breast surgery  06/2007    right mastectomy  . Breast surgery  2011    left  . Kidney stone surgery    . Back surgery    . Foot surgery    . Mastectomy Right 2011    with Radiaiton  . Reduction mammaplasty Left 2012    Family History  Problem Relation Age of Onset  . Osteoporosis Mother   . Heart disease Father   . Cancer Brother   . Breast cancer Brother   . Heart disease Sister     Social History:  reports that she quit smoking about 31 years ago. She has never used smokeless tobacco. She reports that she drinks alcohol. She reports that she does not use illicit drugs.  Review of Systems:  She is being evaluated for pulmonary nodules  Chronic kidney disease: Creatinine has been  upper normal, also followed periodically by nephrologist Recently creatinine has been better at 1.0 in December Last urine microalbumin was high  No significant hypertension  Lab Results  Component Value Date   CREATININE 1.15 03/20/2015    HYPERLIPIDEMIA: The lipid abnormality consists of elevated  triglycerides, has been treated with TriCor.  Lab Results  Component Value Date   CHOL 265* 03/20/2015   HDL 66.10 03/20/2015   LDLCALC  32 12/05/2013   LDLDIRECT 152.0 03/20/2015   TRIG * 03/20/2015    451.0 Triglyceride is over 400; calculations on Lipids are invalid.   CHOLHDL 4 03/20/2015     She has chronic pains and paresthesia in her feet and lower legs She is asking for refill today for her hydrocodone as usual   Also on gabapentin 600 mg and Cymbalta, she thinks gabapentin helps   Last diabetic foot exam was in /16 with the following findings Decreased/ absent monofilament sensation on her toes and distal plantar surfaces.  Pedal pulses normal on the left and absent on the right Deformities of left second and third toes present  She has had a multinodular goiter, this has been euthyroid  Lab Results  Component Value Date   TSH 1.14 03/20/2015        Examination:   BP 124/72 mmHg  Pulse 83  Temp(Src) 98 F (36.7 C)  Resp 14  Ht 5\' 5"  (1.651 m)  Wt 170 lb 12.8 oz (77.474 kg)  BMI 28.42 kg/m2  SpO2 97%  Body mass index is 28.42 kg/(m^2).     ASSESSMENT/ PLAN:   Diabetes type 2:  See history  of present illness for detailed discussion of  current management, blood sugar patterns and problems identified She is still requiring relatively large doses of insulin despite not eating a large meals or carbohydrates Most of her high readings are towards evening and appear to be higher even before supper despite not eating much at lunchtime Her basal insulin appears to be adequate with reasonably good fasting readings overall and recently better Also has not had much hypoglycemia early morning  A1c is lower than expected at 7.4, her average glucose at home is 183  Recommendations: Stop Toujeo  Humulin RU-500 insulin twice a day, starting with 40 units before breakfast and 60 before supper Discussed in detail how this insulin is formulated, onset and duration of action of the insulin, how to use the insulin pen and adjustment of dosage. Most likely she will need a little more insulin but she will need  to call her readings next week to make adjustments  CKD/nephropathy: Creatinine has been better, will need follow-up of urine microalbumin  NEUROPATHY: She continues to have chronic leg pains taking chronic narcotic analgesics.   Also taking Cymbalta and gabapentin with some relief.  Hydrocodone 10 mg prescription, 100 tablets given and she continues to get this from our office, is not getting it from other physicians  Influenza vaccine given   Patient Instructions  Take new insulin 30-45 min before meals, 40-45 in am and 60-65 before supper  Check blood sugars on waking up 4  times a week Also check blood sugars about 2 hours after a meal and do this after different meals by rotation  Recommended blood sugar levels on waking up is 90-130 and about 2 hours after meal is 130-160  Please bring your blood sugar monitor to each visit, thank you  Call readings in 1 week     Counseling time on subjects discussed above is over 50% of today's 25 minute visit  Ashaunte Standley 06/20/2015, 12:40 PM

## 2015-06-20 NOTE — Patient Instructions (Addendum)
Take new insulin 30-45 min before meals, 40-45 in am and 60-65 before supper  Check blood sugars on waking up 4  times a week Also check blood sugars about 2 hours after a meal and do this after different meals by rotation  Recommended blood sugar levels on waking up is 90-130 and about 2 hours after meal is 130-160  Please bring your blood sugar monitor to each visit, thank you  Call readings in 1 week

## 2015-07-24 LAB — MICROALBUMIN, URINE: Microalb, Ur: 3639

## 2015-07-25 ENCOUNTER — Encounter: Payer: Self-pay | Admitting: Endocrinology

## 2015-07-25 ENCOUNTER — Ambulatory Visit (INDEPENDENT_AMBULATORY_CARE_PROVIDER_SITE_OTHER): Payer: Medicare Other | Admitting: Endocrinology

## 2015-07-25 VITALS — BP 110/58 | HR 81 | Temp 98.3°F | Resp 16 | Ht 65.0 in | Wt 173.8 lb

## 2015-07-25 DIAGNOSIS — Z794 Long term (current) use of insulin: Secondary | ICD-10-CM

## 2015-07-25 DIAGNOSIS — E1165 Type 2 diabetes mellitus with hyperglycemia: Secondary | ICD-10-CM | POA: Diagnosis not present

## 2015-07-25 NOTE — Patient Instructions (Signed)
Reduce before supper dose to 55 units  Check blood sugars on waking up 5-6  times a week Also check blood sugars about 2 hours after a meal and do this after different meals by rotation  Recommended blood sugar levels on waking up is 100-130 and about 2 hours after meal is 130-180  Please bring your blood sugar monitor to each visit, thank you  Keep a sticky on fridge to take insulin before preparing meal

## 2015-07-25 NOTE — Progress Notes (Signed)
Patient ID: Andrea Wallace, female   DOB: 1931/09/27, 82 y.o.   MRN: [id number]   Reason for Appointment: Diabetes follow-up   History of Present Illness   Diagnosis: Type 2 DIABETES MELITUS, date of diagnosis 1999  Previous history: She has been on insulin for several years to control her diabetes and usually requires large doses Has been on basal bolus regimen for the last few years, taking Lantus twice a day.  Her blood sugars tend to fluctuate significantly but her A1c is usually at a reasonable level She does not clearly benefit from GLP-1 drugs and had been taking Byetta to help with postprandial hyperglycemia, this has been stopped  She had been tried on Victoza, Byetta and Invokana previously and these were  stopped because of unclear benefit       RECENT history  Insulin regimen: Humulin R U-500 with Kwikpen 40 at breakfast and 60 at supper time  Her blood sugars  have been difficult to manage and with requiring large doses of mealtime insulin she was switched on her last visit to U-500 insulin and also Toujeo.  More recently blood sugars appear to be improving with an average of 149 compared to 183 before  Although her last A1c was 7.4 this is lower than expected for her actual readings   Current blood sugar patterns and problems identified:  She has had  relatively low readings waking up in the second week of February but now they are near normal with sporadic high readings  She has a few readings in the afternoon which are significantly better compared to her last visit  Blood sugars after supper have overall improved significantly in the last 2 weeks but still has a few readings over 200 recently  Although she was told to take the insulin 30 minutes before she is eating she tends to forget to do this and may take it right at her mealtime  She is still concerned about the cost of her insulin  Continues to take metformin without side effects  However weight  is up slightly and she is concerned about this   Blood sugar readings by  download of her FreeStyle monitor show the following for the last 4 weeks :  Mean values apply above for all meters except median for One Touch  PRE-MEAL Fasting Lunch Dinner Bedtime Overall  Glucose range: 56-145   107-154   93-187 57-272    Mean/median:  125     195  149+/-60    Oral hypoglycemic drugs: Metformin ER, 500 mg 2 a day       Side effects from medications: None  Proper timing of medications in relation to meals: Yes.         Monitors blood glucose:  2-3 times a day on average.    Glucometer: Freestyle          Meals: 3 meals per day.  Nutrisystem usually; at breakfast she is eating a  Peanut butter toast.  Protein bar at lunch, small portions at supper          Physical activity: exercise: none           Dietician visit: Most recent: Years ago       Complications: are: Neuropathy, microalbuminuria     Wt Readings from Last 3 Encounters:  07/25/15 173 lb 12.8 oz (78.835 kg)  06/20/15 170 lb 12.8 oz (77.474 kg)  04/25/15 171 lb 6.5 oz (77.75 kg)     Lab  Results  Component Value Date   HGBA1C 7.4 06/20/2015   HGBA1C 7.4 03/20/2015   HGBA1C 7.9* 12/18/2014   Lab Results  Component Value Date   MICROALBUR 285.9* 03/20/2015   LDLCALC 32 12/05/2013   CREATININE 1.15 03/20/2015   Creatinine level I.0 in 12/16    Medication List       This list is accurate as of: 07/25/15 12:57 PM.  Always use your most recent med list.               aspirin 81 MG tablet  Take 81 mg by mouth daily.     betamethasone dipropionate 0.05 % cream  Commonly known as:  DIPROLENE     CRESTOR 40 MG tablet  Generic drug:  rosuvastatin  Take 40 mg by mouth daily.     DULoxetine 60 MG capsule  Commonly known as:  CYMBALTA  60 mg.     gabapentin 600 MG tablet  Commonly known as:  NEURONTIN  TAKE ONE TABLET BY MOUTH THREE TIMES DAILY     glucose blood test strip  Commonly known as:  FREESTYLE LITE    USE AS INSTRUCTED TO CHECK BLOOD SUGARS THREE TIMES PER DAY. Dx code E11.65     HYDROcodone-acetaminophen 10-325 MG tablet  Commonly known as:  NORCO  Take 1 tablet by mouth 2 (two) times daily as needed for moderate pain.     Insulin Pen Needle 31G X 5 MM Misc  Commonly known as:  B-D UF III MINI PEN NEEDLES  Use 3 pen needles per day     insulin regular human CONCENTRATED 500 UNIT/ML kwikpen  Commonly known as:  HUMULIN R U-500 KWIKPEN  40 Units in am and 60 in pm, 30 minutes before each meal     Insulin Syringe-Needle U-100 30G X 5/16" 0.5 ML Misc  1 each by Does not apply route 4 (four) times daily.     RELION INSULIN SYR .3CC/29G 29G X 1/2" 0.3 ML Misc  Generic drug:  Insulin Syringe-Needle U-100     montelukast 10 MG tablet  Commonly known as:  SINGULAIR  Take 10 mg by mouth at bedtime.     multivitamin with minerals Tabs tablet  Take 1 tablet by mouth daily.     omeprazole 20 MG capsule  Commonly known as:  PRILOSEC  Take 20 mg by mouth daily.     propranolol ER 120 MG 24 hr capsule  Commonly known as:  INDERAL LA  Take 120 mg by mouth daily.     VITAMIN B-12 PO  Take 1 tablet by mouth daily.     VITAMIN D PO  Take 1 tablet by mouth daily.        Allergies:  Allergies  Allergen Reactions  . Codeine Sulfate Other (See Comments)  . Etodolac Other (See Comments)  . Oxycodone-Acetaminophen Other (See Comments)  . Sulfamethoxazole-Trimethoprim Other (See Comments)    Past Medical History  Diagnosis Date  . Anemia   . Arthritis   . Blood transfusion   . Asthma   . Diabetes mellitus   . Hyperlipidemia   . Neuromuscular disorder ([place])   . Generalized headaches   . Nasal congestion   . Cough   . Cancer ([place])     bilateral breast  . Allergy   . Complication of anesthesia     diff  waking up  . Hypertension     dr Ouida Sills  . Breast cancer [facility] 2011    Right  Past Surgical History  Procedure Laterality Date  . Total knee arthroplasty     . Shoulder arthroscopy      bilateral  . Breast surgery  06/2007    right mastectomy  . Breast surgery  2011    left  . Kidney stone surgery    . Back surgery    . Foot surgery    . Mastectomy Right 2011    with Radiaiton  . Reduction mammaplasty Left 2012    Family History  Problem Relation Age of Onset  . Osteoporosis Mother   . Heart disease Father   . Cancer Brother   . Breast cancer Brother   . Heart disease Sister     Social History:  reports that she quit smoking about 31 years ago. She has never used smokeless tobacco. She reports that she drinks alcohol. She reports that she does not use illicit drugs.  Review of Systems:  She is being evaluated for pulmonary nodules, awaiting PET scan   Chronic kidney disease: Creatinine has been  upper normal, also followed periodically by nephrologist Recently creatinine has been better at 1.0 in December Last urine microalbumin was high  No significant hypertension  Lab Results  Component Value Date   CREATININE 1.15 03/20/2015    HYPERLIPIDEMIA: The lipid abnormality consists of elevated  triglycerides, has been treated with TriCor, last lipids are improved done by PCP .  04/30/2015 09/27/2014     173 132   136 157   54.1 36.6   92 64   27 31   3.2        Lab Results  Component Value Date   CHOL 265* 03/20/2015   HDL 66.10 03/20/2015   LDLCALC 32 12/05/2013   LDLDIRECT 152.0 03/20/2015   TRIG * 03/20/2015    451.0 Triglyceride is over 400; calculations on Lipids are invalid.   CHOLHDL 4 03/20/2015     She has chronic pains and paresthesia in her feet and lower legs She is asking for refill today for her hydrocodone as usual   Also on gabapentin 600 mg and Cymbalta, she thinks gabapentin helps   Last diabetic foot exam was in /16 with the following findings Decreased/ absent monofilament sensation on her toes and distal plantar surfaces.  Pedal pulses normal on the left and absent on the  right Deformities of left second and third toes present  She has had a multinodular goiter, this has been euthyroid  Lab Results  Component Value Date   TSH 1.14 03/20/2015        Examination:   BP 110/58 mmHg  Pulse 81  Temp(Src) 98.3 F (36.8 C)  Resp 16  Ht 5\' 5"  (1.651 m)  Wt 173 lb 12.8 oz (78.835 kg)  BMI 28.92 kg/m2  SpO2 93%  Body mass index is 28.92 kg/(m^2).     ASSESSMENT/ PLAN:   Diabetes type 2:  See history of present illness for detailed discussion of  current management, blood sugar patterns and problems identified She is  having overall better readings with U-500 insulin compared to Toujeo and Humalog Some of her high readings after supper are probably related to her not taking the insulin 30 measures before eating Fasting readings are still relatively low and because of  possibility of low readings during the night will reduce her suppertime dose by 5 units  She will try to put a reminder on her refrigerator or otherwise to take insulin 30 minute before eating   CKD/nephropathy:  Creatinine has been stable, she will have labs recently done forwarded to [place] .     Patient Instructions  Reduce before supper dose to 55 units  Check blood sugars on waking up 5-6  times a week Also check blood sugars about 2 hours after a meal and do this after different meals by rotation  Recommended blood sugar levels on waking up is 100-130 and about 2 hours after meal is 130-180  Please bring your blood sugar monitor to each visit, thank you  Keep a sticky on fridge to take insulin before preparing meal        Andrea Wallace 07/25/2015, 12:57 PM

## 2015-07-29 DIAGNOSIS — R809 Proteinuria, unspecified: Secondary | ICD-10-CM | POA: Insufficient documentation

## 2015-07-30 ENCOUNTER — Encounter: Payer: Self-pay | Admitting: *Deleted

## 2015-08-05 ENCOUNTER — Ambulatory Visit
Admission: RE | Admit: 2015-08-05 | Discharge: 2015-08-05 | Disposition: A | Discharge status: Home / Self Care | Payer: Medicare Other | Source: Ambulatory Visit | Attending: Oncology | Admitting: Oncology

## 2015-08-05 DIAGNOSIS — R911 Solitary pulmonary nodule: Secondary | ICD-10-CM | POA: Insufficient documentation

## 2015-08-05 DIAGNOSIS — R590 Localized enlarged lymph nodes: Secondary | ICD-10-CM | POA: Insufficient documentation

## 2015-08-05 LAB — POCT I-STAT CREATININE: CREATININE: 0.9 mg/dL (ref 0.44–1.00)

## 2015-08-05 MED ORDER — IOHEXOL 300 MG/ML  SOLN
75.0000 mL | Freq: Once | INTRAMUSCULAR | Status: AC | PRN
  Administered 2015-08-05: 75 mL via INTRAVENOUS

## 2015-08-06 ENCOUNTER — Other Ambulatory Visit: Payer: Self-pay | Admitting: Endocrinology

## 2015-08-07 ENCOUNTER — Ambulatory Visit: Payer: Medicare Other | Admitting: Oncology

## 2015-08-14 ENCOUNTER — Inpatient Hospital Stay: Payer: Medicare Other | Attending: Oncology | Admitting: Oncology

## 2015-08-14 ENCOUNTER — Ambulatory Visit: Payer: Medicare Other

## 2015-08-14 VITALS — BP 141/78 | HR 116 | Temp 96.9°F | Resp 157 | Ht 65.0 in | Wt 168.9 lb

## 2015-08-14 DIAGNOSIS — E785 Hyperlipidemia, unspecified: Secondary | ICD-10-CM | POA: Diagnosis not present

## 2015-08-14 DIAGNOSIS — Z853 Personal history of malignant neoplasm of breast: Secondary | ICD-10-CM | POA: Diagnosis not present

## 2015-08-14 DIAGNOSIS — N200 Calculus of kidney: Secondary | ICD-10-CM | POA: Insufficient documentation

## 2015-08-14 DIAGNOSIS — E1165 Type 2 diabetes mellitus with hyperglycemia: Secondary | ICD-10-CM | POA: Insufficient documentation

## 2015-08-14 DIAGNOSIS — R911 Solitary pulmonary nodule: Secondary | ICD-10-CM | POA: Diagnosis not present

## 2015-08-14 DIAGNOSIS — Z87891 Personal history of nicotine dependence: Secondary | ICD-10-CM

## 2015-08-14 DIAGNOSIS — J45909 Unspecified asthma, uncomplicated: Secondary | ICD-10-CM | POA: Diagnosis not present

## 2015-08-14 DIAGNOSIS — I10 Essential (primary) hypertension: Secondary | ICD-10-CM | POA: Insufficient documentation

## 2015-08-14 DIAGNOSIS — Z7982 Long term (current) use of aspirin: Secondary | ICD-10-CM | POA: Diagnosis not present

## 2015-08-14 DIAGNOSIS — R978 Other abnormal tumor markers: Secondary | ICD-10-CM | POA: Insufficient documentation

## 2015-08-14 DIAGNOSIS — Z79899 Other long term (current) drug therapy: Secondary | ICD-10-CM

## 2015-08-14 DIAGNOSIS — C50911 Malignant neoplasm of unspecified site of right female breast: Secondary | ICD-10-CM

## 2015-08-14 NOTE — Progress Notes (Signed)
Pt reports her cough has improved.  When she coughs up anything its clear medium in amount and medium and thickness.  Nervous about scan.

## 2015-08-15 LAB — CANCER ANTIGEN 27.29: CA 27.29: 40.4 U/mL — AB (ref 0.0–38.6)

## 2015-08-26 NOTE — Progress Notes (Signed)
Andrea Wallace  [phone] [phone]) [phone]  ID: Andrea Wallace OB: [birth year]  MR#: [id number]  [id number]  Patient Care Team: Kirk Ruths, MD as PCP - General (Unknown Physician Specialty)  CHIEF COMPLAINT:  Chief Complaint  Patient presents with  . Follow-up    Pulmonary Nodule    INTERVAL HISTORY: Patient returns to clinic today for further evaluation and discussion of her CT scan results. She is anxious, but otherwise feels well. She had a recent productive cough, but this has since resolved. She denies any chest pain or shortness of breath. She has no neurologic complaints. She denies any recent fevers. She denies any weight loss. She has no nausea, vomiting, constipation, or diarrhea. She has no urinary complaints. Patient otherwise feels well and offers no further specific complaints.  REVIEW OF SYSTEMS:   Review of Systems  Constitutional: Positive for malaise/fatigue. Negative for fever and weight loss.  Respiratory: Negative for cough, hemoptysis, sputum production and shortness of breath.   Cardiovascular: Negative.  Negative for chest pain.  Gastrointestinal: Negative.   Musculoskeletal: Negative.   Neurological: Positive for weakness.  Psychiatric/Behavioral: The patient is nervous/anxious.     As per HPI. Otherwise, a complete review of systems is negatve.  PAST MEDICAL HISTORY: Past Medical History  Diagnosis Date  . Anemia   . Arthritis   . Blood transfusion   . Asthma   . Diabetes mellitus   . Hyperlipidemia   . Neuromuscular disorder ([place])   . Generalized headaches   . Nasal congestion   . Cough   . Cancer ([place])     bilateral breast  . Allergy   . Complication of anesthesia     diff  waking up  . Hypertension     dr Ouida Sills  . Breast cancer [facility]) 2011    Right    PAST SURGICAL HISTORY: Past Surgical History  Procedure Laterality Date  . Total knee arthroplasty    . Shoulder arthroscopy     bilateral  . Breast surgery  06/2007    right mastectomy  . Breast surgery  2011    left  . Kidney stone surgery    . Back surgery    . Foot surgery    . Mastectomy Right 2011    with Radiaiton  . Reduction mammaplasty Left 2012    FAMILY HISTORY Family History  Problem Relation Age of Onset  . Osteoporosis Mother   . Heart disease Father   . Cancer Brother   . Breast cancer Brother   . Heart disease Sister        ADVANCED DIRECTIVES:    HEALTH MAINTENANCE: Social History  Substance Use Topics  . Smoking status: Former Smoker    Quit date: 03/15/1984  . Smokeless tobacco: Never Used  . Alcohol Use: Yes     Comment: occ     Colonoscopy:  PAP:  Bone density:  Lipid panel:  Allergies  Allergen Reactions  . Codeine Sulfate Other (See Comments)  . Etodolac Other (See Comments)  . Oxycodone-Acetaminophen Other (See Comments)  . Sulfamethoxazole-Trimethoprim Other (See Comments)    Current Outpatient Prescriptions  Medication Sig Dispense Refill  . acetaminophen (TYLENOL) 500 MG tablet Take 500 mg by mouth every 6 (six) hours as needed.    Marland Kitchen aspirin 81 MG tablet Take 81 mg by mouth daily.    . Cholecalciferol (VITAMIN D PO) Take 1 tablet by mouth daily.    . CRESTOR 40 MG tablet Take 40  mg by mouth daily.     . Cyanocobalamin (VITAMIN B-12 PO) Take 1 tablet by mouth daily.    . DULoxetine (CYMBALTA) 60 MG capsule 60 mg.    . gabapentin (NEURONTIN) 600 MG tablet TAKE ONE TABLET BY MOUTH THREE TIMES DAILY 90 tablet 5  . glucose blood (FREESTYLE LITE) test strip USE AS INSTRUCTED TO CHECK BLOOD SUGARS THREE TIMES PER DAY. Dx code E11.65 100 each 5  . HYDROcodone-acetaminophen (NORCO) 10-325 MG tablet Take 1 tablet by mouth 2 (two) times daily as needed for moderate pain. 100 tablet 0  . insulin regular human CONCENTRATED (HUMULIN R U-500 KWIKPEN) 500 UNIT/ML kwikpen 40 Units in am and 60 in pm, 30 minutes before each meal 2 pen 2  . montelukast (SINGULAIR) 10 MG  tablet Take 10 mg by mouth at bedtime.      No current facility-administered medications for this visit.    OBJECTIVE: Filed Vitals:   08/14/15 1547  BP: 141/78  Pulse: 116  Temp: 96.9 F (36.1 C)  Resp: 157     Body mass index is 28.1 kg/(m^2).    ECOG FS:1 - Symptomatic but completely ambulatory  General: Well-developed, well-nourished, no acute distress. Eyes: Pink conjunctiva, anicteric sclera. Lungs: Scattered wheezing throughout. Heart: Regular rate and rhythm. No rubs, murmurs, or gallops. Abdomen: Soft, nontender, nondistended. No organomegaly noted, normoactive bowel sounds. Musculoskeletal: No edema, cyanosis, or clubbing. Neuro: Alert, answering all questions appropriately. Cranial nerves grossly intact. Skin: No rashes or petechiae noted. Psych: Normal affect.  LAB RESULTS:  Lab Results  Component Value Date   NA 139 03/20/2015   K 4.8 03/20/2015   CL 103 03/20/2015   CO2 26 03/20/2015   GLUCOSE 205* 03/20/2015   BUN 37* 03/20/2015   CREATININE 0.90 08/05/2015   CALCIUM 9.0 03/20/2015   PROT 6.8 03/20/2015   ALBUMIN 3.6 03/20/2015   AST 25 03/20/2015   ALT 23 03/20/2015   ALKPHOS 76 03/20/2015   BILITOT 0.4 03/20/2015   GFRNONAA 47* 06/29/2012   GFRAA 54* 06/29/2012    Lab Results  Component Value Date   WBC 9.5 06/29/2012   NEUTROABS 6.8 06/21/2012   HGB 9.4* 06/29/2012   HCT 28.5* 06/29/2012   MCV 88.8 06/29/2012   PLT 141* 06/29/2012   Lab Results  Component Value Date   LABCA2 40.4* 08/14/2015     STUDIES: Ct Chest W Contrast  08/05/2015  CLINICAL DATA:  Patient with pulmonary nodule, suspicious for primary pulmonary malignancy. History of right breast cancer. Crawl for 4 months. EXAM: CT CHEST WITH CONTRAST TECHNIQUE: Multidetector CT imaging of the chest was performed during intravenous contrast administration. CONTRAST:  52mL OMNIPAQUE IOHEXOL 300 MG/ML  SOLN COMPARISON:  PET-CT 04/23/2015; CT chest 04/11/2015. FINDINGS:  Mediastinum/Nodes: Grossly unchanged sub cm low-attenuation lesion within the left lobe of the thyroid. Grossly unchanged 9 mm precarinal lymph node (image 19; series 2). Normal heart size. Dense coronary arterial vascular calcifications. Normal esophagus. Lungs/Pleura: Central airways are patent. Re- demonstrated 11 x 8 mm left lower lobe nodule (image 38; series 3). Grossly unchanged 5 mm nodule within the right fissure (image 28 ; series 3) Subpleural reticular opacities and scarring within the lungs bilaterally. No pleural effusion or pneumothorax. Upper abdomen: Unchanged too small to characterize low-attenuation liver lesions (image 46; series 2) and (image 55; series 2). Anterior abdominal wall laxity. Musculoskeletal: Thoracic spine degenerative changes. No aggressive or acute appearing osseous lesions. IMPRESSION: Grossly unchanged 11 mm nodule within the left lower lobe,  which may represent a primary bronchogenic carcinoma, as discussed on prior PET-CT. If no intervention is planned, short-term follow-up as per oncology and/or pulmonology is recommended. Grossly unchanged mildly enlarged mediastinal lymph nodes. Electronically Signed   By: Lovey Newcomer M.D.   On: 08/05/2015 14:35    ASSESSMENT: Left lower lobe lung mass, history of breast cancer.  PLAN:    1. Lung mass: Case previously discussed extensively with Dr. Nestor Lewandowsky. CT results from August 05, 2015 reviewed independently and reported as above with an essentially unchanged 11 mm nodule in the left lower lobe. Given the appearance on CT scan, this may be more consistent with a carcinoid rather than a bronchogenic carcinoma. Given patient's extensive comorbidities, she is not anxious to proceed with percutaneous biopsy. She also does not wish to undergo surgery at this time. We will continue to monitor closely and repeat CT scan in 6 months to assess for interval change. Patient will follow-up 1-2 days later to discuss the results and  further evaluation. 2. History of breast cancer: Continue mammograms as ordered. CA-27-29 mildly elevated, repeat in 3 months. 3. Cough: Resolved.  4. Hyperglycemia: Continue current diabetic medications.   Patient expressed understanding and was in agreement with this plan. She also understands that She can call clinic at any time with any questions, concerns, or complaints.    Lloyd Huger, MD   08/26/2015 5:31 PM

## 2015-09-10 ENCOUNTER — Other Ambulatory Visit: Payer: Self-pay | Admitting: Endocrinology

## 2015-09-24 ENCOUNTER — Ambulatory Visit (INDEPENDENT_AMBULATORY_CARE_PROVIDER_SITE_OTHER): Payer: Medicare Other | Admitting: Endocrinology

## 2015-09-24 ENCOUNTER — Encounter: Payer: Self-pay | Admitting: Endocrinology

## 2015-09-24 VITALS — BP 132/74 | HR 98 | Temp 97.9°F | Resp 14 | Ht 65.0 in | Wt 170.8 lb

## 2015-09-24 DIAGNOSIS — E1165 Type 2 diabetes mellitus with hyperglycemia: Secondary | ICD-10-CM | POA: Diagnosis not present

## 2015-09-24 DIAGNOSIS — Z794 Long term (current) use of insulin: Secondary | ICD-10-CM

## 2015-09-24 DIAGNOSIS — E118 Type 2 diabetes mellitus with unspecified complications: Secondary | ICD-10-CM | POA: Diagnosis not present

## 2015-09-24 DIAGNOSIS — IMO0002 Reserved for concepts with insufficient information to code with codable children: Secondary | ICD-10-CM

## 2015-09-24 LAB — POCT GLYCOSYLATED HEMOGLOBIN (HGB A1C): HEMOGLOBIN A1C: 7.8

## 2015-09-24 MED ORDER — HYDROCODONE-ACETAMINOPHEN 10-325 MG PO TABS: 1.0000 | ORAL_TABLET | Freq: Two times a day (BID) | ORAL | Status: DC | PRN

## 2015-09-24 NOTE — Patient Instructions (Signed)
Increase am INSULIN dose to 50   If eating lunch take 15 units insulin, at least 15 min before  Stay on 55 at supper

## 2015-09-24 NOTE — Progress Notes (Signed)
Patient ID: Andrea Wallace, female   DOB: January 14, 1932, 82 y.o.   MRN: [id number]   Reason for Appointment: Diabetes follow-up   History of Present Illness   Diagnosis: Type 2 DIABETES MELITUS, date of diagnosis 1999  Previous history: She has been on insulin for several years to control her diabetes and usually requires large doses Has been on basal bolus regimen for the last few years, taking Lantus twice a day.  Her blood sugars tend to fluctuate significantly but her A1c is usually at a reasonable level She does not clearly benefit from GLP-1 drugs and had been taking Byetta to help with postprandial hyperglycemia, this has been stopped  She had been tried on Victoza, Byetta and Invokana previously and these were  stopped because of unclear benefit       RECENT history:  Insulin regimen: Humulin R U-500 with Kwikpen 40 at breakfast and 55 at supper time  Her blood sugars  have been difficult to manage and with requiring large doses of mealtime insulin she was switched to U-500 insulin in 1/17  Although her last A1c was 7.4 this is now slightly higher at 7.8  Current management, blood sugar patterns and problems identified:  She has had overall fairly good readings fasting and they are not low with reducing her suppertime dose to 55 units on the last visit   She has had only one low blood sugar in the morning of 36, not clear why  She has checked a couple of readings only at lunchtime and these are over 200  She now says that sometimes she will eat a half sandwich at lunch but not taking any insulin for this  More recently her sugars at suppertime are consistently high  Blood sugars after supper are variable  Continues to take metformin without side effects  However weight is up slightly   Blood sugar readings by  download of her FreeStyle monitor show the following for the last 4 weeks :  Mean values apply above for all meters except median for One  Touch  PRE-MEAL Fasting Lunch Dinner Bedtime Overall  Glucose range: 36-189  250, 285  72-301  154-341    Mean/median: 130   168  240  180    Oral hypoglycemic drugs: Metformin ER, 500 mg 2 a day       Side effects from medications: None  Proper timing of medications in relation to meals: Yes.         Monitors blood glucose:  2-3 times a day on average.    Glucometer: Freestyle          Meals: 3 meals per day.   at breakfast she is eating a  Peanut butter toast.  1/2 Sandwich or boiled egg at lunch, small portions at supper          Physical activity: exercise: none           Dietician visit: Most recent: Years ago       Complications: are: Neuropathy, microalbuminuria     Wt Readings from Last 3 Encounters:  09/24/15 170 lb 12.8 oz (77.474 kg)  08/14/15 168 lb 14 oz (76.6 kg)  07/25/15 173 lb 12.8 oz (78.835 kg)     Lab Results  Component Value Date   HGBA1C 7.8 09/24/2015   HGBA1C 7.4 06/20/2015   HGBA1C 7.4 03/20/2015   Lab Results  Component Value Date   MICROALBUR 3639 07/24/2015   LDLCALC 32 12/05/2013  CREATININE 0.90 08/05/2015   Creatinine level I.0 in 12/16    Medication List       This list is accurate as of: 09/24/15 11:14 AM.  Always use your most recent med list.               acetaminophen 500 MG tablet  Commonly known as:  TYLENOL  Take 500 mg by mouth every 6 (six) hours as needed.     aspirin 81 MG tablet  Take 81 mg by mouth daily.     CRESTOR 40 MG tablet  Generic drug:  rosuvastatin  Take 40 mg by mouth daily.     DULoxetine 60 MG capsule  Commonly known as:  CYMBALTA  60 mg.     FREESTYLE LITE test strip  Generic drug:  glucose blood  USE ONE STRIP TO CHECK GLUCOSE THREE TIMES DAILY     gabapentin 600 MG tablet  Commonly known as:  NEURONTIN  TAKE ONE TABLET BY MOUTH THREE TIMES DAILY     HYDROcodone-acetaminophen 10-325 MG tablet  Commonly known as:  NORCO  Take 1 tablet by mouth 2 (two) times daily as needed for  moderate pain.     insulin regular human CONCENTRATED 500 UNIT/ML kwikpen  Commonly known as:  HUMULIN R U-500 KWIKPEN  40 Units in am and 60 in pm, 30 minutes before each meal     montelukast 10 MG tablet  Commonly known as:  SINGULAIR  Take 10 mg by mouth at bedtime.     VITAMIN B-12 PO  Take 1 tablet by mouth daily.     VITAMIN D PO  Take 1 tablet by mouth daily. Reported on 09/24/2015        Allergies:  Allergies  Allergen Reactions  . Codeine Sulfate Other (See Comments)  . Etodolac Other (See Comments)  . Oxycodone-Acetaminophen Other (See Comments)  . Sulfamethoxazole-Trimethoprim Other (See Comments)    Past Medical History  Diagnosis Date  . Anemia   . Arthritis   . Blood transfusion   . Asthma   . Diabetes mellitus   . Hyperlipidemia   . Neuromuscular disorder ([place])   . Generalized headaches   . Nasal congestion   . Cough   . Cancer ([place])     bilateral breast  . Allergy   . Complication of anesthesia     diff  waking up  . Hypertension     dr Ouida Sills  . Breast cancer [facility]) 2011    Right    Past Surgical History  Procedure Laterality Date  . Total knee arthroplasty    . Shoulder arthroscopy      bilateral  . Breast surgery  06/2007    right mastectomy  . Breast surgery  2011    left  . Kidney stone surgery    . Back surgery    . Foot surgery    . Mastectomy Right 2011    with Radiaiton  . Reduction mammaplasty Left 2012    Family History  Problem Relation Age of Onset  . Osteoporosis Mother   . Heart disease Father   . Cancer Brother   . Breast cancer Brother   . Heart disease Sister     Social History:  reports that she quit smoking about 31 years ago. She has never used smokeless tobacco. She reports that she drinks alcohol. She reports that she does not use illicit drugs.  Review of Systems:  She is asking about treatment for headaches, waking  up with these and has had this chronically  Chronic kidney disease: Creatinine has  been  upper normal, also followed periodically by nephrologist Recently creatinine has been upper normal Last urine microalbumin was high  No significant hypertension  Lab Results  Component Value Date   CREATININE 0.90 08/05/2015    HYPERLIPIDEMIA: The lipid abnormality consists of elevated  triglycerides, has been treated with TriCor, last lipids are improved done by PCP .  04/30/2015 09/27/2014     173 132   136 157   54.1 36.6   92 64   27 31   3.2        Lab Results  Component Value Date   CHOL 265* 03/20/2015   HDL 66.10 03/20/2015   LDLCALC 32 12/05/2013   LDLDIRECT 152.0 03/20/2015   TRIG * 03/20/2015    451.0 Triglyceride is over 400; calculations on Lipids are invalid.   CHOLHDL 4 03/20/2015     She has chronic pains and paresthesia in her feet and lower legs She is asking for refill today for her hydrocodone Again   Also on gabapentin 600 mg and Cymbalta, she thinks gabapentin helps   Last diabetic foot exam was in /16 with the following findings Decreased/ absent monofilament sensation on her toes and distal plantar surfaces.  Pedal pulses normal on the left and absent on the right Deformities of left second and third toes present  She has had a multinodular goiter, this has been euthyroid  Lab Results  Component Value Date   TSH 1.14 03/20/2015        Examination:   BP 132/74 mmHg  Pulse 98  Temp(Src) 97.9 F (36.6 C)  Resp 14  Ht 5\' 5"  (1.651 m)  Wt 170 lb 12.8 oz (77.474 kg)  BMI 28.42 kg/m2  SpO2 94%  Body mass index is 28.42 kg/(m^2).   No ankle edema present  ASSESSMENT/ PLAN:   Diabetes type 2:  See history of present illness for detailed discussion of  current management, blood sugar patterns and problems identified She is on a regimen of U-500 insulin alone with the pen Blood sugars are inconsistent especially later in the day He probably has high readings after breakfast although his monitored only twice Again with her  trying to eat something at lunchtime especially when she is eating a sandwich she appears to get high readings at suppertime also   Will increase her morning dose by 10 units and at 15 units at lunch if she is eating any carbohydrate She will try to take her insulin 15 minutes or more before her planned meals  She will have labs done with her PCP  She'll also discussed with PCP management of her headaches and need for assistive devices for walking Percocet 100 tablets given   Patient Instructions  Increase am INSULIN dose to 50   If eating lunch take 15 units insulin, at least 15 min before  Stay on 55 at supper         [facility] 09/24/2015, 11:14 AM      Note: This office note was prepared with Estate agent. Any transcriptional errors that result from this process are unintentional.

## 2015-10-02 LAB — HM DIABETES EYE EXAM

## 2015-10-03 ENCOUNTER — Other Ambulatory Visit: Payer: Self-pay | Admitting: Internal Medicine

## 2015-10-03 DIAGNOSIS — Z1231 Encounter for screening mammogram for malignant neoplasm of breast: Secondary | ICD-10-CM

## 2015-10-15 ENCOUNTER — Ambulatory Visit: Admission: RE | Admit: 2015-10-15 | Payer: Medicare Other | Source: Ambulatory Visit

## 2015-10-16 ENCOUNTER — Other Ambulatory Visit: Payer: Self-pay | Admitting: Endocrinology

## 2015-10-18 ENCOUNTER — Encounter: Payer: Self-pay | Admitting: *Deleted

## 2015-10-23 ENCOUNTER — Ambulatory Visit
Admission: RE | Admit: 2015-10-23 | Discharge: 2015-10-23 | Disposition: A | Discharge status: Home / Self Care | Payer: Medicare Other | Source: Ambulatory Visit | Attending: Internal Medicine | Admitting: Internal Medicine

## 2015-10-23 ENCOUNTER — Other Ambulatory Visit: Payer: Self-pay | Admitting: Internal Medicine

## 2015-10-23 DIAGNOSIS — Z1231 Encounter for screening mammogram for malignant neoplasm of breast: Secondary | ICD-10-CM

## 2015-12-02 ENCOUNTER — Inpatient Hospital Stay: Payer: Medicare Other | Attending: Oncology

## 2015-12-02 DIAGNOSIS — C50911 Malignant neoplasm of unspecified site of right female breast: Secondary | ICD-10-CM

## 2015-12-02 DIAGNOSIS — Z853 Personal history of malignant neoplasm of breast: Secondary | ICD-10-CM | POA: Diagnosis not present

## 2015-12-02 DIAGNOSIS — R911 Solitary pulmonary nodule: Secondary | ICD-10-CM | POA: Diagnosis not present

## 2015-12-02 DIAGNOSIS — Z87891 Personal history of nicotine dependence: Secondary | ICD-10-CM | POA: Diagnosis not present

## 2015-12-02 DIAGNOSIS — Z79899 Other long term (current) drug therapy: Secondary | ICD-10-CM | POA: Diagnosis not present

## 2015-12-02 DIAGNOSIS — R978 Other abnormal tumor markers: Secondary | ICD-10-CM | POA: Insufficient documentation

## 2015-12-03 LAB — CANCER ANTIGEN 27.29: CA 27.29: 26 U/mL (ref 0.0–38.6)

## 2015-12-05 ENCOUNTER — Other Ambulatory Visit: Payer: Self-pay | Admitting: Endocrinology

## 2015-12-24 ENCOUNTER — Encounter: Payer: Self-pay | Admitting: Endocrinology

## 2015-12-24 ENCOUNTER — Ambulatory Visit (INDEPENDENT_AMBULATORY_CARE_PROVIDER_SITE_OTHER): Payer: Medicare Other | Admitting: Endocrinology

## 2015-12-24 VITALS — BP 122/60 | HR 79 | Ht 65.0 in | Wt 173.0 lb

## 2015-12-24 DIAGNOSIS — E1142 Type 2 diabetes mellitus with diabetic polyneuropathy: Secondary | ICD-10-CM | POA: Diagnosis not present

## 2015-12-24 DIAGNOSIS — E782 Mixed hyperlipidemia: Secondary | ICD-10-CM

## 2015-12-24 DIAGNOSIS — E1165 Type 2 diabetes mellitus with hyperglycemia: Secondary | ICD-10-CM

## 2015-12-24 DIAGNOSIS — E118 Type 2 diabetes mellitus with unspecified complications: Secondary | ICD-10-CM | POA: Diagnosis not present

## 2015-12-24 DIAGNOSIS — Z794 Long term (current) use of insulin: Secondary | ICD-10-CM

## 2015-12-24 DIAGNOSIS — IMO0002 Reserved for concepts with insufficient information to code with codable children: Secondary | ICD-10-CM

## 2015-12-24 LAB — LIPID PANEL
Cholesterol: 141 mg/dL (ref 0–200)
HDL: 42.1 mg/dL (ref >39.00)
NonHDL: 98.65
TRIGLYCERIDES: 206 mg/dL — AB (ref 0.0–149.0)
Total CHOL/HDL Ratio: 3
VLDL: 41.2 mg/dL — AB (ref 0.0–40.0)

## 2015-12-24 LAB — URINALYSIS, ROUTINE W REFLEX MICROSCOPIC
BILIRUBIN URINE: NEGATIVE
Ketones, ur: NEGATIVE
Nitrite: NEGATIVE
Specific Gravity, Urine: 1.025 (ref 1.000–1.030)
Urine Glucose: NEGATIVE
Urobilinogen, UA: 0.2 (ref 0.0–1.0)
pH: 5.5 (ref 5.0–8.0)

## 2015-12-24 LAB — COMPREHENSIVE METABOLIC PANEL
ALBUMIN: 3.9 g/dL (ref 3.5–5.2)
ALK PHOS: 76 U/L (ref 39–117)
ALT: 13 U/L (ref 0–35)
AST: 17 U/L (ref 0–37)
BUN: 30 mg/dL — AB (ref 6–23)
CO2: 29 mEq/L (ref 19–32)
CREATININE: 1.26 mg/dL — AB (ref 0.40–1.20)
Calcium: 9.6 mg/dL (ref 8.4–10.5)
Chloride: 103 mEq/L (ref 96–112)
GFR: 43.22 mL/min — ABNORMAL LOW (ref >60.00)
GLUCOSE: 214 mg/dL — AB (ref 70–99)
Potassium: 4.5 mEq/L (ref 3.5–5.1)
SODIUM: 140 meq/L (ref 135–145)
TOTAL PROTEIN: 7.2 g/dL (ref 6.0–8.3)
Total Bilirubin: 0.4 mg/dL (ref 0.2–1.2)

## 2015-12-24 LAB — POCT GLYCOSYLATED HEMOGLOBIN (HGB A1C): HEMOGLOBIN A1C: 8.3

## 2015-12-24 LAB — MICROALBUMIN / CREATININE URINE RATIO
Creatinine,U: 68.5 mg/dL
MICROALB UR: 122.6 mg/dL — AB (ref 0.0–1.9)
Microalb Creat Ratio: 179 mg/g — ABNORMAL HIGH (ref 0.0–30.0)

## 2015-12-24 LAB — LDL CHOLESTEROL, DIRECT: LDL DIRECT: 70 mg/dL

## 2015-12-24 MED ORDER — HYDROCODONE-ACETAMINOPHEN 10-325 MG PO TABS: 1.0000 | ORAL_TABLET | Freq: Two times a day (BID) | ORAL | 0 refills | Status: DC | PRN

## 2015-12-24 NOTE — Progress Notes (Signed)
Patient ID: Andrea Wallace, female   DOB: 10/26/1931, 82 y.o.   MRN: [id number]   Reason for Appointment: Diabetes follow-up   History of Present Illness   Diagnosis: Type 2 DIABETES MELITUS, date of diagnosis 1999  Previous history: She has been on insulin for several years to control her diabetes and usually requires large doses Has been on basal bolus regimen for the last few years, taking Lantus twice a day.  Her blood sugars tend to fluctuate significantly but her A1c is usually at a reasonable level She does not clearly benefit from GLP-1 drugs and had been taking Byetta to help with postprandial hyperglycemia, this has been stopped  She had been tried on Victoza, Byetta and Invokana previously and these were  stopped because of unclear benefit       RECENT history:  Insulin regimen: Humulin R U-500 with Kwikpen 50 at breakfast and 55 at supper time  Her blood sugars  have been difficult to manage and with requiring large doses of mealtime insulin she was switched to U-500 insulin in 1/17  Although her last A1c was 7.8  this is now 8.3  Current management, blood sugar patterns and problems identified:  She has preferred taking 1 type of insulin instead of 2  However her blood sugars are getting progressively higher now  Even though she was told to take a higher dose of the U-500 in the morning her blood sugars are high at suppertime most of the time  This is despite her blood sugars being usually near normal or slightly low at lunchtime; she is rarely checking blood sugars midday; has had 1 low blood sugar at lunch also  Most of her readings before suppertime are consistently high and also after supper  Fasting blood sugars however are overall good with some readings over 200 and low  only once or twice  Continues to take metformin without side effects  However weight is up slightly again  Blood sugar readings by  download of her FreeStyle monitor show the  following for the last 4 weeks :  Mean values apply above for all meters except median for One Touch  PRE-MEAL Fasting Lunch Dinner Bedtime Overall  Glucose range: 59-309  56-214  107-306  184-393    Mean/median: 155  143  210  245  197    Oral hypoglycemic drugs: Metformin ER, 500 mg 2 a day       Side effects from medications: None  Proper timing of medications in relation to meals: Yes.         Monitors blood glucose:  2-3 times a day on average.    Glucometer: Freestyle          Meals: 3 meals per day.   at breakfast she is eating a  Peanut butter toast.  1/2 Sandwich or boiled egg at lunch, small portions at supper          Physical activity: exercise: none, has back pain            Dietician visit: Most recent: Years ago       Complications: are: Neuropathy, microalbuminuria     Wt Readings from Last 3 Encounters:  12/24/15 173 lb (78.5 kg)  09/24/15 170 lb 12.8 oz (77.5 kg)  08/14/15 168 lb 14 oz (76.6 kg)     Lab Results  Component Value Date   HGBA1C 7.8 09/24/2015   HGBA1C 7.4 06/20/2015   HGBA1C 7.4 03/20/2015  Lab Results  Component Value Date   MICROALBUR 3,639 07/24/2015   LDLCALC 32 12/05/2013   CREATININE 0.90 08/05/2015   Creatinine level I.0 in 12/16    Medication List       Accurate as of 12/24/15 10:59 AM. Always use your most recent med list.          acetaminophen 500 MG tablet Commonly known as:  TYLENOL Take 500 mg by mouth every 6 (six) hours as needed.   aspirin 81 MG tablet Take 81 mg by mouth daily.   CRESTOR 40 MG tablet Generic drug:  rosuvastatin Take 40 mg by mouth daily.   DULoxetine 60 MG capsule Commonly known as:  CYMBALTA 60 mg.   FREESTYLE LITE test strip Generic drug:  glucose blood USE ONE STRIP TO CHECK GLUCOSE THREE TIMES DAILY   gabapentin 600 MG tablet Commonly known as:  NEURONTIN TAKE ONE TABLET BY MOUTH THREE TIMES DAILY   HUMULIN R U-500 KWIKPEN 500 UNIT/ML kwikpen Generic drug:  insulin regular  human CONCENTRATED INJECT 40 UNITS IN THE MORNING AND INJECT 60 UNITS IN THE EVENING. USE 30 MINUTES BEFORE EACH MEAL   HYDROcodone-acetaminophen 10-325 MG tablet Commonly known as:  NORCO Take 1 tablet by mouth 2 (two) times daily as needed for moderate pain.   montelukast 10 MG tablet Commonly known as:  SINGULAIR Take 10 mg by mouth at bedtime.   VITAMIN B-12 PO Take 1 tablet by mouth daily.   VITAMIN D PO Take 1 tablet by mouth daily. Reported on 09/24/2015       Allergies:  Allergies  Allergen Reactions  . Codeine Sulfate Other (See Comments)  . Etodolac Other (See Comments)  . Oxycodone-Acetaminophen Other (See Comments)  . Sulfamethoxazole-Trimethoprim Other (See Comments)    Past Medical History:  Diagnosis Date  . Allergy   . Anemia   . Arthritis   . Asthma   . Blood transfusion   . Breast cancer [facility]) 2011   Right  . Cancer ([place])    right breast  . Complication of anesthesia    diff  waking up  . Cough   . Diabetes mellitus   . Generalized headaches   . Hyperlipidemia   . Hypertension    dr Ouida Sills  . Nasal congestion   . Neuromuscular disorder [facility])     Past Surgical History:  Procedure Laterality Date  . BACK SURGERY    . BREAST SURGERY  06/2007   right mastectomy  . BREAST SURGERY  2011   left  . FOOT SURGERY    . KIDNEY STONE SURGERY    . MASTECTOMY Right 2011   with Radiaiton  . REDUCTION MAMMAPLASTY Left 2012  . SHOULDER ARTHROSCOPY     bilateral  . TOTAL KNEE ARTHROPLASTY      Family History  Problem Relation Age of Onset  . Osteoporosis Mother   . Heart disease Father   . Cancer Brother   . Breast cancer Brother   . Heart disease Sister     Social History:  reports that she quit smoking about 31 years ago. She has never used smokeless tobacco. She reports that she drinks alcohol. She reports that she does not use drugs.  Review of Systems:   Chronic kidney disease: Creatinine has been  upper normal, also followed  periodically by nephrologist Last urine microalbumin was high  No Recent hypertension  Lab Results  Component Value Date   CREATININE 0.90 08/05/2015    HYPERLIPIDEMIA: The lipid  abnormality consists of elevated  triglycerides, has been treated with TriCor, last lipids are improved done by PCP .  04/30/2015 09/27/2014     173 132   136 157   54.1 36.6   92 64   27 31   3.2        Lab Results  Component Value Date   CHOL 265 (H) 03/20/2015   HDL 66.10 03/20/2015   LDLCALC 32 12/05/2013   LDLDIRECT 152.0 03/20/2015   TRIG (H) 03/20/2015    451.0 Triglyceride is over 400; calculations on Lipids are invalid.   CHOLHDL 4 03/20/2015     She has chronic pains and paresthesia in her feet and lower legs She is asking for refill today for her hydrocodone Again, Uses this for her back pain also   Also on gabapentin 600 mg and Cymbalta   Last diabetic foot exam was in 8/17 during some sensory loss She is using comfortable soft shoes which she thinks helps discomfort  She has had a multinodular goiter, this has been euthyroid  Lab Results  Component Value Date   TSH 1.14 03/20/2015        Examination:   BP 122/60 (BP Location: Left Arm, Patient Position: Sitting, Cuff Size: Normal)   Pulse 79   Ht 5\' 5"  (1.651 m)   Wt 173 lb (78.5 kg)   SpO2 92%   BMI 28.79 kg/m   Body mass index is 28.79 kg/m.   No ankle edema present  Diabetic Foot Exam - Simple   Simple Foot Form Diabetic Foot exam was performed with the following findings:  Yes 12/24/2015  9:34 AM  Visual Inspection See comments:  Yes Sensation Testing See comments:  Yes Pulse Check See comments:  Yes Comments She has significant callus formation on the plantar surface of the left medial foot and some on the right. She has nearly absent monofilament sensation distally on the toes but relatively better on the plantar surfaces. Pedal pulses significantly decreased on the left      ASSESSMENT/ PLAN:    Diabetes type 2:  See history of present illness for detailed discussion of  current management, blood sugar patterns and problems identified She is on a regimen of U-500 insulin alone with the pen Blood sugars are Not well controlled with A1c over 8% now  She has consistently high readings before and after supper even without her getting much food or carbohydrate at lunchtime She was told to try taking some insulin at lunchtime but she does not do so because she is eating only a small meal or snack with little carbohydrate Unable to increase her suppertime dose because of her blood sugars in the mornings being overall fairly good  Plan: She will try to take her insulin 15 minutes or more before her planned meals Start taking at least 20 units of insulin at lunchtime regardless of how much she is eating Balanced low fat meals and avoid high carbohydrate foods especially at suppertime If her blood sugars are not controlled after evening meal will consider adding small dose of Regular Insulin also She will have labs done today including lipids Discussed checking feet daily because of neuropathy  Percocet 100 tablets given for her chronic pain and neuropathy and drug screen also done   Patient Instructions  NEW insulin doses:  45 units before breakfast, 20 units before lunch/midday snack regardless of what you are eating and 50 units before evening meal  If eating a sandwich or a  full lunch increase the dose to  30 units  IMPORTANT: If the blood sugar is below 120 reduce the insulin by 5 units  Call if blood sugars are consistently over 200 at bedtime or getting consistently low   Counseling time on subjects discussed above is over 50% of today's 25 minute visit    Rylynn Kobs 12/24/2015, 10:59 AM      Note: This office note was prepared with Estate agent. Any transcriptional errors that result from this process are unintentional.

## 2015-12-24 NOTE — Patient Instructions (Signed)
NEW insulin doses:  45 units before breakfast, 20 units before lunch/midday snack regardless of what you are eating and 50 units before evening meal  If eating a sandwich or a full lunch increase the dose to  30 units  IMPORTANT: If the blood sugar is below 120 reduce the insulin by 5 units  Call if blood sugars are consistently over 200 at bedtime or getting consistently low

## 2016-02-14 ENCOUNTER — Ambulatory Visit
Admission: RE | Admit: 2016-02-14 | Discharge: 2016-02-14 | Disposition: A | Discharge status: Home / Self Care | Payer: Medicare Other | Source: Ambulatory Visit | Attending: Oncology | Admitting: Oncology

## 2016-02-14 DIAGNOSIS — R918 Other nonspecific abnormal finding of lung field: Secondary | ICD-10-CM | POA: Insufficient documentation

## 2016-02-14 DIAGNOSIS — R911 Solitary pulmonary nodule: Secondary | ICD-10-CM | POA: Diagnosis present

## 2016-02-14 DIAGNOSIS — R590 Localized enlarged lymph nodes: Secondary | ICD-10-CM | POA: Diagnosis not present

## 2016-02-14 DIAGNOSIS — C50911 Malignant neoplasm of unspecified site of right female breast: Secondary | ICD-10-CM

## 2016-02-14 DIAGNOSIS — K769 Liver disease, unspecified: Secondary | ICD-10-CM | POA: Diagnosis not present

## 2016-02-14 MED ORDER — IOPAMIDOL (ISOVUE-300) INJECTION 61%
60.0000 mL | Freq: Once | INTRAVENOUS | Status: AC | PRN
  Administered 2016-02-14: 60 mL via INTRAVENOUS

## 2016-02-16 DIAGNOSIS — R918 Other nonspecific abnormal finding of lung field: Secondary | ICD-10-CM | POA: Insufficient documentation

## 2016-02-16 NOTE — Progress Notes (Signed)
[place]  [phone] [phone]  ID: Andrea Wallace OB: November 28, 1931  MR#: [id number]  MAU#:[id number]  Patient Care Team: Kirk Ruths, MD as PCP - General (Unknown Physician Specialty)  CHIEF COMPLAINT: Left lower lobe lung mass.  INTERVAL HISTORY: Patient returns to clinic today for further evaluation and discussion of her CT scan results. She continues to be anxious, but otherwise feels well. She denies any chest pain, cough, hemoptysis, or shortness of breath. She has no neurologic complaints. She denies any recent fevers or illnesses. She has a good appetite and denies any weight loss. She has no nausea, vomiting, constipation, or diarrhea. She has no urinary complaints. Patient offers no specific complaints today.  REVIEW OF SYSTEMS:   Review of Systems  Constitutional: Negative for fever, malaise/fatigue and weight loss.  Respiratory: Negative for cough, hemoptysis, sputum production and shortness of breath.   Cardiovascular: Negative.  Negative for chest pain and leg swelling.  Gastrointestinal: Negative.  Negative for abdominal pain.  Musculoskeletal: Negative.   Neurological: Negative.  Negative for weakness.  Psychiatric/Behavioral: The patient is nervous/anxious.     As per HPI. Otherwise, a complete review of systems is negative.  PAST MEDICAL HISTORY: Past Medical History:  Diagnosis Date  . Allergy   . Anemia   . Arthritis   . Asthma   . Blood transfusion   . Breast cancer [facility]) 2011   Right  . Cancer ([place])    right breast  . Complication of anesthesia    diff  waking up  . Cough   . Diabetes mellitus   . Generalized headaches   . Hyperlipidemia   . Hypertension    dr Ouida Sills  . Nasal congestion   . Neuromuscular disorder ([place])     PAST SURGICAL HISTORY: Past Surgical History:  Procedure Laterality Date  . BACK SURGERY    . BREAST SURGERY  06/2007   right mastectomy  . BREAST SURGERY  2011   left    . FOOT SURGERY    . KIDNEY STONE SURGERY    . MASTECTOMY Right 2011   with Radiaiton  . REDUCTION MAMMAPLASTY Left 2012  . SHOULDER ARTHROSCOPY     bilateral  . TOTAL KNEE ARTHROPLASTY      FAMILY HISTORY Family History  Problem Relation Age of Onset  . Osteoporosis Mother   . Heart disease Father   . Cancer Brother   . Breast cancer Brother   . Heart disease Sister        ADVANCED DIRECTIVES:    HEALTH MAINTENANCE: Social History  Substance Use Topics  . Smoking status: Former Smoker    Quit date: 03/15/1984  . Smokeless tobacco: Never Used  . Alcohol use Yes     Comment: occ     Colonoscopy:  PAP:  Bone density:  Lipid panel:  Allergies  Allergen Reactions  . Codeine Sulfate Other (See Comments)  . Etodolac Other (See Comments)  . Oxycodone-Acetaminophen Other (See Comments)  . Sulfamethoxazole-Trimethoprim Other (See Comments)    Current Outpatient Prescriptions  Medication Sig Dispense Refill  . acetaminophen (TYLENOL) 500 MG tablet Take 500 mg by mouth every 6 (six) hours as needed.    Marland Kitchen aspirin 81 MG tablet Take 81 mg by mouth daily.    . Cholecalciferol (VITAMIN D PO) Take 1 tablet by mouth daily. Reported on 09/24/2015    . CRESTOR 40 MG tablet Take 40 mg by mouth daily.     . Cyanocobalamin (VITAMIN  B-12 PO) Take 1 tablet by mouth daily.    . DULoxetine (CYMBALTA) 60 MG capsule 60 mg.    . FREESTYLE LITE test strip USE ONE STRIP TO CHECK GLUCOSE THREE TIMES DAILY 100 each 5  . gabapentin (NEURONTIN) 600 MG tablet TAKE ONE TABLET BY MOUTH THREE TIMES DAILY 90 tablet 5  . HUMULIN R U-500 KWIKPEN 500 UNIT/ML kwikpen INJECT 40 UNITS IN THE MORNING AND INJECT 60 UNITS IN THE EVENING. USE 30 MINUTES BEFORE EACH MEAL 12 mL 2  . HYDROcodone-acetaminophen (NORCO) 10-325 MG tablet Take 1 tablet by mouth 2 (two) times daily as needed for moderate pain. 100 tablet 0  . montelukast (SINGULAIR) 10 MG tablet Take 10 mg by mouth at bedtime.      No current  facility-administered medications for this visit.     OBJECTIVE: Vitals:   02/17/16 1435  BP: (!) 96/51  Pulse: 74  Resp: 18  Temp: (!) 95 F (35 C)     Body mass index is 28.82 kg/m.    ECOG FS:1 - Symptomatic but completely ambulatory  General: Well-developed, well-nourished, no acute distress. Eyes: Pink conjunctiva, anicteric sclera. Lungs: Scattered wheezing throughout. Heart: Regular rate and rhythm. No rubs, murmurs, or gallops. Abdomen: Soft, nontender, nondistended. No organomegaly noted, normoactive bowel sounds. Musculoskeletal: No edema, cyanosis, or clubbing. Neuro: Alert, answering all questions appropriately. Cranial nerves grossly intact. Skin: No rashes or petechiae noted. Psych: Normal affect.  LAB RESULTS:  Lab Results  Component Value Date   NA 140 12/24/2015   K 4.5 12/24/2015   CL 103 12/24/2015   CO2 29 12/24/2015   GLUCOSE 214 (H) 12/24/2015   BUN 30 (H) 12/24/2015   CREATININE 1.26 (H) 12/24/2015   CALCIUM 9.6 12/24/2015   PROT 7.2 12/24/2015   ALBUMIN 3.9 12/24/2015   AST 17 12/24/2015   ALT 13 12/24/2015   ALKPHOS 76 12/24/2015   BILITOT 0.4 12/24/2015   GFRNONAA 47 (L) 06/29/2012   GFRAA 54 (L) 06/29/2012    Lab Results  Component Value Date   WBC 9.5 06/29/2012   NEUTROABS 6.8 06/21/2012   HGB 9.4 (L) 06/29/2012   HCT 28.5 (L) 06/29/2012   MCV 88.8 06/29/2012   PLT 141 (L) 06/29/2012   Lab Results  Component Value Date   LABCA2 26.0 12/02/2015     STUDIES: Ct Chest W Contrast  Result Date: 02/14/2016 CLINICAL DATA:  Restaging right breast cancer. EXAM: CT CHEST WITH CONTRAST TECHNIQUE: Multidetector CT imaging of the chest was performed during intravenous contrast administration. CONTRAST:  39mL ISOVUE-300 IOPAMIDOL (ISOVUE-300) INJECTION 61% COMPARISON:  08/05/2015. FINDINGS: Cardiovascular: The heart size is normal. No pericardial effusion. Coronary artery calcification is noted. Atherosclerotic calcification is noted in  the wall of the thoracic aorta. Mediastinum/Nodes: 9 mm short axis precarinal lymph node has increased in the interval, now measuring 12 mm. 12 mm short axis subcarinal lymph node was 8 mm short axis when I remeasure on prior study. 10 mm short axis AP window lymph node was 5 mm short axis when I remeasure on prior study. 12 mm short axis right hilar lymph node has progressed in the interval. The esophagus has normal imaging features. Stable 9 mm left thyroid nodule There is no axillary lymphadenopathy. Lungs/Pleura: Stable appearance of chronic interstitial changes in the lungs. Knee 8 x 11 mm left lower lobe nodule seen adjacent to the heart on the previous study measures 8 x 10 mm today, stable. 5 mm anterior right upper lobe nodule, along  the minor fissure, is also unchanged. Several other very tiny pulmonary nodules are stable in the interval. No focal airspace consolidation. No pulmonary edema or pleural effusion. Upper Abdomen: 11 mm hypo attenuating lesion in the inferior right liver was present previously but incompletely visualized on the earlier exam. Liver parenchyma otherwise unremarkable. Abdominal aortic atherosclerosis noted without aneurysm within the visualized abdominal aorta. Musculoskeletal: Bone windows reveal no worrisome lytic or sclerotic osseous lesions. IMPRESSION: 1. Interval progression of mediastinal lymph nodes, now consistent with mild mediastinal and right hilar lymphadenopathy. These lymph nodes were shown to have low level hypermetabolism on PET-CT from 04/23/2015. As metastatic disease is not excluded, close continued attention recommended. 2. Stable appearance of bilateral pulmonary nodules, including the dominant 11-12 mm left lower lobe nodule. No new or progressive nodularity within the lungs. 3. Stable appearance 11 mm hypo attenuating inferior right liver lesion. Electronically Signed   By: Misty Stanley M.D.   On: 02/14/2016 10:52    ASSESSMENT: Left lower lobe lung  mass, history of breast cancer.  PLAN:    1. Left lower lobe lung mass: Case previously discussed extensively with Dr. Nestor Lewandowsky. CT results from February 14, 2016 reviewed independently and reported as above with mild progression of mediastinal and right hilar lymphadenopathy. Patient has stable appearance of her bilateral pulmonary nodules and the 11 mm nodule in the left lower lobe. Given the appearance on CT scan, this may be more consistent with a carcinoid rather than a bronchogenic carcinoma. Given patient's extensive comorbidities, she is not anxious to proceed with percutaneous biopsy. She also does not wish to undergo surgery at this time. Will continue to monitor closely and repeat CT scan in 6 months to assess for interval change. Patient will follow-up 1-2 days later to discuss the results and further evaluation. 2. History of breast cancer: Continue mammograms as ordered. CA-27-29 is within normal limits.  3. Cough: Resolved.  4. Hyperglycemia: Continue current diabetic medications.   Patient expressed understanding and was in agreement with this plan. She also understands that She can call clinic at any time with any questions, concerns, or complaints.    Lloyd Huger, MD   02/19/2016 10:00 PM

## 2016-02-17 ENCOUNTER — Inpatient Hospital Stay: Payer: Medicare Other | Attending: Oncology | Admitting: Oncology

## 2016-02-17 DIAGNOSIS — R918 Other nonspecific abnormal finding of lung field: Secondary | ICD-10-CM | POA: Diagnosis not present

## 2016-02-17 DIAGNOSIS — I10 Essential (primary) hypertension: Secondary | ICD-10-CM | POA: Insufficient documentation

## 2016-02-17 DIAGNOSIS — Z87891 Personal history of nicotine dependence: Secondary | ICD-10-CM | POA: Insufficient documentation

## 2016-02-17 DIAGNOSIS — E785 Hyperlipidemia, unspecified: Secondary | ICD-10-CM | POA: Insufficient documentation

## 2016-02-17 DIAGNOSIS — Z9011 Acquired absence of right breast and nipple: Secondary | ICD-10-CM | POA: Insufficient documentation

## 2016-02-17 DIAGNOSIS — Z853 Personal history of malignant neoplasm of breast: Secondary | ICD-10-CM | POA: Insufficient documentation

## 2016-02-17 DIAGNOSIS — E119 Type 2 diabetes mellitus without complications: Secondary | ICD-10-CM | POA: Insufficient documentation

## 2016-02-17 DIAGNOSIS — Z79899 Other long term (current) drug therapy: Secondary | ICD-10-CM | POA: Diagnosis not present

## 2016-02-17 DIAGNOSIS — M1990 Unspecified osteoarthritis, unspecified site: Secondary | ICD-10-CM | POA: Insufficient documentation

## 2016-02-17 NOTE — Progress Notes (Signed)
States is feeling well. Offers no complaints. 

## 2016-02-25 ENCOUNTER — Other Ambulatory Visit: Payer: Self-pay

## 2016-02-25 ENCOUNTER — Encounter: Payer: Self-pay | Admitting: Endocrinology

## 2016-02-25 ENCOUNTER — Ambulatory Visit (INDEPENDENT_AMBULATORY_CARE_PROVIDER_SITE_OTHER): Payer: Medicare Other | Admitting: Endocrinology

## 2016-02-25 VITALS — BP 96/52 | HR 71 | Wt 170.0 lb

## 2016-02-25 DIAGNOSIS — E1165 Type 2 diabetes mellitus with hyperglycemia: Secondary | ICD-10-CM

## 2016-02-25 DIAGNOSIS — E1142 Type 2 diabetes mellitus with diabetic polyneuropathy: Secondary | ICD-10-CM | POA: Insufficient documentation

## 2016-02-25 DIAGNOSIS — Z23 Encounter for immunization: Secondary | ICD-10-CM | POA: Diagnosis not present

## 2016-02-25 DIAGNOSIS — Z794 Long term (current) use of insulin: Secondary | ICD-10-CM

## 2016-02-25 DIAGNOSIS — R5383 Other fatigue: Secondary | ICD-10-CM

## 2016-02-25 DIAGNOSIS — G629 Polyneuropathy, unspecified: Secondary | ICD-10-CM | POA: Diagnosis not present

## 2016-02-25 MED ORDER — "INSULIN SYRINGE 31G X 5/16"" 1 ML MISC": 3 refills | Status: DC

## 2016-02-25 MED ORDER — INSULIN REGULAR HUMAN 100 UNIT/ML IJ SOLN: INTRAMUSCULAR | 3 refills | Status: DC

## 2016-02-25 MED ORDER — INSULIN NPH (HUMAN) (ISOPHANE) 100 UNIT/ML ~~LOC~~ SUSP: SUBCUTANEOUS | 3 refills | Status: DC

## 2016-02-25 MED ORDER — TRAMADOL HCL 50 MG PO TABS: 50.0000 mg | ORAL_TABLET | Freq: Two times a day (BID) | ORAL | 0 refills | Status: DC | PRN

## 2016-02-25 NOTE — Progress Notes (Signed)
Patient ID: Andrea Wallace, female   DOB: 1931-11-05, 82 y.o.   MRN: [id number]   Reason for Appointment: Diabetes follow-up   History of Present Illness   Diagnosis: Type 2 DIABETES MELITUS, date of diagnosis 1999  Previous history: She has been on insulin for several years to control her diabetes and usually requires large doses Has been on basal bolus regimen for the last few years, taking Lantus twice a day.  Her blood sugars tend to fluctuate significantly but her A1c is usually at a reasonable level She does not clearly benefit from GLP-1 drugs and had been taking Byetta to help with postprandial hyperglycemia, this has been stopped  She had been tried on Victoza, Byetta and Invokana previously and these were  stopped because of unclear benefit       RECENT history:  Insulin regimen: Humulin R U-500 with Kwikpen 50 at breakfast and 20 units at lunch and and 55 at supper time  Her blood sugars  have been difficult to manage and with requiring large doses of mealtime insulin she was switched to U-500 insulin in 1/17  Although her last A1c was 8.3 she was reporting mostly high readings in the afternoon and after supper on the last visit and is here today for short-term follow-up.  On the last visit was told to start taking 20 units of insulin at lunch regardless of what she is eating  Current management, blood sugar patterns and problems identified:  She did not bring her monitor and not clear what her blood sugar patterns are  She has poor recall but she thinks her blood sugars before supper are better than before and are not as significantly high at bedtime also  However she now says that she cannot afford to U-500 insulin because of higher out-of-pocket expense  Has not gained any weight despite increase insulin and better sugars  No hypoglycemia reported  She is generally compliant with taking her insulin before meals also; the timing of insulin is  variable  Blood sugar readings by  recall:   PRE-MEAL Fasting Lunch Dinner Bedtime Overall  Glucose range: 120-200  150-200 200   Mean/median:        Oral hypoglycemic drugs: Metformin ER, 500 mg 2 a day       Side effects from medications: None  Proper timing of medications in relation to meals: Yes.         Monitors blood glucose:  2-3 times a day on average.    Glucometer: Freestyle          Meals: 3 meals per day.   at breakfast she is eating a  Peanut butter toast.  1/2 Sandwich or boiled egg at lunch, small portions at supper          Physical activity: exercise: none, has back pain            Dietician visit: Most recent: Years ago       Complications: are: Neuropathy, microalbuminuria     Wt Readings from Last 3 Encounters:  02/25/16 170 lb (77.1 kg)  02/17/16 173 lb 2.7 oz (78.5 kg)  12/24/15 173 lb (78.5 kg)     Lab Results  Component Value Date   HGBA1C 8.3 12/24/2015   HGBA1C 7.8 09/24/2015   HGBA1C 7.4 06/20/2015   Lab Results  Component Value Date   MICROALBUR 122.6 (H) 12/24/2015   LDLCALC 32 12/05/2013   CREATININE 1.26 (H) 12/24/2015  Medication List       Accurate as of 02/25/16  9:18 PM. Always use your most recent med list.          acetaminophen 500 MG tablet Commonly known as:  TYLENOL Take 500 mg by mouth every 6 (six) hours as needed.   aspirin 81 MG tablet Take 81 mg by mouth daily.   CRESTOR 40 MG tablet Generic drug:  rosuvastatin Take 40 mg by mouth daily.   DULoxetine 60 MG capsule Commonly known as:  CYMBALTA 60 mg.   FREESTYLE LITE test strip Generic drug:  glucose blood USE ONE STRIP TO CHECK GLUCOSE THREE TIMES DAILY   gabapentin 600 MG tablet Commonly known as:  NEURONTIN TAKE ONE TABLET BY MOUTH THREE TIMES DAILY   HUMULIN R U-500 KWIKPEN 500 UNIT/ML kwikpen Generic drug:  insulin regular human CONCENTRATED INJECT 40 UNITS IN THE MORNING AND INJECT 60 UNITS IN THE EVENING. USE 30 MINUTES BEFORE EACH  MEAL   insulin regular 100 units/mL injection Commonly known as:  NOVOLIN R RELION Use 25 units in the morning and 35 units at supper   HYDROcodone-acetaminophen 10-325 MG tablet Commonly known as:  NORCO Take 1 tablet by mouth 2 (two) times daily as needed for moderate pain.   insulin NPH Human 100 UNIT/ML injection Commonly known as:  NOVOLIN N RELION Inject 40 units daily   INSULIN SYRINGE 1CC/31GX5/16" 31G X 5/16" 1 ML Misc Use to inject insulin 4 times daily   montelukast 10 MG tablet Commonly known as:  SINGULAIR Take 10 mg by mouth at bedtime.   traMADol 50 MG tablet Commonly known as:  ULTRAM Take 1 tablet (50 mg total) by mouth every 12 (twelve) hours as needed.   VITAMIN B-12 PO Take 1 tablet by mouth daily.   VITAMIN D PO Take 1 tablet by mouth daily. Reported on 09/24/2015       Allergies:  Allergies  Allergen Reactions  . Codeine Sulfate Other (See Comments)  . Etodolac Other (See Comments)  . Oxycodone-Acetaminophen Other (See Comments)  . Sulfamethoxazole-Trimethoprim Other (See Comments)    Past Medical History:  Diagnosis Date  . Allergy   . Anemia   . Arthritis   . Asthma   . Blood transfusion   . Breast cancer [facility]) 2011   Right  . Cancer ([place])    right breast  . Complication of anesthesia    diff  waking up  . Cough   . Diabetes mellitus   . Generalized headaches   . Hyperlipidemia   . Hypertension    dr Ouida Sills  . Nasal congestion   . Neuromuscular disorder [facility])     Past Surgical History:  Procedure Laterality Date  . BACK SURGERY    . BREAST SURGERY  06/2007   right mastectomy  . BREAST SURGERY  2011   left  . FOOT SURGERY    . KIDNEY STONE SURGERY    . MASTECTOMY Right 2011   with Radiaiton  . REDUCTION MAMMAPLASTY Left 2012  . SHOULDER ARTHROSCOPY     bilateral  . TOTAL KNEE ARTHROPLASTY      Family History  Problem Relation Age of Onset  . Osteoporosis Mother   . Heart disease Father   . Cancer Brother   .  Breast cancer Brother   . Heart disease Sister     Social History:  reports that she quit smoking about 31 years ago. She has never used smokeless tobacco. She reports that she  drinks alcohol. She reports that she does not use drugs.  Review of Systems:  She is today complaining of feeling significantly tired, has not had any specific evaluation or discussion with PCP and no recent labs regarding B12, hemoglobin or thyroid levels available  Chronic kidney disease: Creatinine has been  upper normal, also followed periodically by nephrologist Last urine microalbumin was high PCP checked her creatinine about 6 weeks ago and was 1.1   Lab Results  Component Value Date   CREATININE 1.26 (H) 12/24/2015    BLOOD pressure: Tends to stay low-normal, not on any antihypertensives or diuretics   HYPERLIPIDEMIA: The lipid abnormality consists of elevated  triglycerides, has been treated with TriCor, last lipids are improved here but triglycerides over 400 with PCP, may have been nonfasting   04/30/2015 09/27/2014     173 132   136 157   54.1 36.6   92 64   27 31   3.2        Lab Results  Component Value Date   CHOL 141 12/24/2015   HDL 42.10 12/24/2015   LDLCALC 32 12/05/2013   LDLDIRECT 70.0 12/24/2015   TRIG 206.0 (H) 12/24/2015   CHOLHDL 3 12/24/2015     She has chronic pains and paresthesia in her feet and lower legs She is asking for A trial of Ultram instead of hydrocortisone today Also takes pain medications for her back  Also on gabapentin 600 mg and Cymbalta  Last diabetic foot exam was in 8/17 during some sensory loss She is using comfortable soft shoes which she thinks helps discomfort  She has had a multinodular goiter, this has been euthyroid  Lab Results  Component Value Date   TSH 1.14 03/20/2015        Examination:   BP (!) 96/52   Pulse 71   Wt 170 lb (77.1 kg)   BMI 28.29 kg/m   Body mass index is 28.29 kg/m.   STANDING blood pressure is  108/58  ASSESSMENT/ PLAN:   Diabetes type 2:  See history of present illness for detailed discussion of  current management, blood sugar patterns and problems identified She is on a regimen of U-500 insulin alone with the penBut is now wanting to change to a cheaper insulin  Blood sugars are again overall high with A1c usually over 8%  Difficult to know what her blood sugar patterns are as she does not remember much and did not bring her monitor which she usually does  FATIGUE: We'll check CBC and thyroid level, may need B12 evaluation also, currently not covered by Medicare for neuropathy or long-term metformin use  Plan: She will try the [employer] brand's of NPH and regular insulin. Discussed in detail the differences between this and other insulins and how to take them Discussed timing of taking the 2 types of insulins and how to mix to 2 insulins in the morning She will call about a week after she starts on the new regimen to report her blood sugars and for adjustment She will also call if she tends to have any hypoglycemia She will need to bring her monitor for short-term follow-up on next visit, will also be due for her A1c then  Tramadol 60 tablets given for her chronic pain and neuropathy and she will let us know if this is effective  High-dose influenza vaccine given  Patient Instructions  When the U-500 insulin is finished the following insulin doses will apply Note that breakfast and supper insulin  needs to be taken about 15-30 minutes before eating  MORNING: He can combine the cloudy and clear insulin as follows Take 40 units of N insulin and 25 of regular  At suppertime take 35 units of regular alone  At bedtime take 25 units of NPH insulin  Call blood sugar readings to the office about a week after starting or if having any blood sugars that are below 80   Counseling time on subjects discussed above is over 50% of today's 25 minute  visit    Jourdan Maldonado 02/25/2016, 9:18 PM      Note: This office note was prepared with Dragon voice recognition system technology. Any transcriptional errors that result from this process are unintentional.

## 2016-02-25 NOTE — Patient Instructions (Signed)
When the U-500 insulin is finished the following insulin doses will apply Note that breakfast and supper insulin needs to be taken about 15-30 minutes before eating  MORNING: He can combine the cloudy and clear insulin as follows Take 40 units of N insulin and 25 of regular  At suppertime take 35 units of regular alone  At bedtime take 25 units of NPH insulin  Call blood sugar readings to the office about a week after starting or if having any blood sugars that are below 80

## 2016-03-16 ENCOUNTER — Other Ambulatory Visit: Payer: Self-pay | Admitting: Endocrinology

## 2016-04-07 ENCOUNTER — Ambulatory Visit (INDEPENDENT_AMBULATORY_CARE_PROVIDER_SITE_OTHER): Payer: Medicare Other | Admitting: Endocrinology

## 2016-04-07 ENCOUNTER — Encounter: Payer: Self-pay | Admitting: Endocrinology

## 2016-04-07 VITALS — BP 112/68 | HR 80 | Ht 65.0 in | Wt 172.0 lb

## 2016-04-07 DIAGNOSIS — E1165 Type 2 diabetes mellitus with hyperglycemia: Secondary | ICD-10-CM

## 2016-04-07 DIAGNOSIS — N182 Chronic kidney disease, stage 2 (mild): Secondary | ICD-10-CM | POA: Diagnosis not present

## 2016-04-07 DIAGNOSIS — E1142 Type 2 diabetes mellitus with diabetic polyneuropathy: Secondary | ICD-10-CM | POA: Diagnosis not present

## 2016-04-07 DIAGNOSIS — F320 Major depressive disorder, single episode, mild: Secondary | ICD-10-CM | POA: Diagnosis not present

## 2016-04-07 DIAGNOSIS — Z794 Long term (current) use of insulin: Secondary | ICD-10-CM | POA: Diagnosis not present

## 2016-04-07 DIAGNOSIS — F32A Depression, unspecified: Secondary | ICD-10-CM

## 2016-04-07 MED ORDER — HYDROCODONE-ACETAMINOPHEN 10-325 MG PO TABS: 1.0000 | ORAL_TABLET | Freq: Two times a day (BID) | ORAL | 0 refills | Status: DC | PRN

## 2016-04-07 MED ORDER — DULOXETINE HCL 60 MG PO CPEP: 60.0000 mg | ORAL_CAPSULE | Freq: Every day | ORAL | 2 refills | Status: DC

## 2016-04-07 NOTE — Progress Notes (Signed)
Patient ID: Andrea Wallace, female   DOB: 04/22/1932, 82 y.o.   MRN: [id number]   Reason for Appointment: Diabetes follow-up   History of Present Illness   Diagnosis: Type 2 DIABETES MELITUS, date of diagnosis 1999  Previous history: She has been on insulin for several years to control her diabetes and usually requires large doses Has been on basal bolus regimen for the last few years, taking Lantus twice a day.  Her blood sugars tend to fluctuate significantly but her A1c is usually at a reasonable level She does not clearly benefit from GLP-1 drugs and had been taking Byetta to help with postprandial hyperglycemia, this has been stopped  She had been tried on Victoza, Byetta and Invokana previously and these were  stopped because of unclear benefit       RECENT history:  Insulin regimen: Novolin N 40-45 in a.m.  REGULAR insulin 45 at breakfast, 40 at supper and 25-30 at bedtime  Her blood sugars  have been difficult to manage and with requiring large doses of mealtime insulin  Although her last A1c was 8.3 she was wanting to switch from her U-500 insulin to a cheaper insulin and is now on [employer] brand NPH and regular  Current management, blood sugar patterns and problems identified:  She did  bring her monitor which she had not previously  She was given written instructions on her new insulin regimen but she did not understand this and is taking regular insulin at bedtime instead of NPH  She thinks her blood sugars are higher when she first switched over in early October but on her own she started increasing her insulin  She is still having significant fluctuation in her blood sugar at various times  FASTING blood sugars are not significantly high although was getting excellent readings in the third week of October and now higher  Blood sugars are still mostly high in the afternoon but sporadically will have better readings with only one low reading of 63  Blood  sugars after supper are again mostly high but variably higher compared to the before meals reading; in the last few days she has had some readings below 130 also  No hypoglycemia reported other than above  She is generally compliant with taking her insulin before meals; the timing of insulin is variable  Blood sugar readings by  download as follows  Mean values apply above for all meters except median for One Touch  PRE-MEAL Fasting Lunch Dinner Bedtime Overall  Glucose range: 65-248  63-353  100-342    Mean/median: 150   196  220  187+/-71     Oral hypoglycemic drugs: Metformin ER, 500 mg 2 a day       Side effects from medications: None  Proper timing of medications in relation to meals: Yes.         Monitors blood glucose:  2-3 times a day on average.    Glucometer: Freestyle          Meals: 3 meals per day.   at breakfast she is eating a  Peanut butter toast.  1/2 Sandwich or boiled egg at lunch, small portions at supper          Physical activity: exercise: none, has back pain            Dietician visit: Most recent: Years ago       Complications: are: Neuropathy, microalbuminuria     Wt Readings from Last 3 Encounters:  04/07/16 172 lb (78 kg)  02/25/16 170 lb (77.1 kg)  02/17/16 173 lb 2.7 oz (78.5 kg)     Lab Results  Component Value Date   HGBA1C 8.3 12/24/2015   HGBA1C 7.8 09/24/2015   HGBA1C 7.4 06/20/2015   Lab Results  Component Value Date   MICROALBUR 122.6 (H) 12/24/2015   LDLCALC 32 12/05/2013   CREATININE 1.26 (H) 12/24/2015       Medication List       Accurate as of 04/07/16 11:00 AM. Always use your most recent med list.          acetaminophen 500 MG tablet Commonly known as:  TYLENOL Take 500 mg by mouth every 6 (six) hours as needed.   aspirin 81 MG tablet Take 81 mg by mouth daily.   CRESTOR 40 MG tablet Generic drug:  rosuvastatin Take 40 mg by mouth daily.   DULoxetine 60 MG capsule Commonly known as:  CYMBALTA Take 1  capsule (60 mg total) by mouth daily.   FREESTYLE LITE test strip Generic drug:  glucose blood USE ONE STRIP TO CHECK GLUCOSE THREE TIMES DAILY   gabapentin 600 MG tablet Commonly known as:  NEURONTIN TAKE ONE TABLET BY MOUTH THREE TIMES DAILY   HYDROcodone-acetaminophen 10-325 MG tablet Commonly known as:  NORCO Take 1 tablet by mouth 2 (two) times daily as needed for moderate pain.   insulin NPH Human 100 UNIT/ML injection Commonly known as:  NOVOLIN N RELION Inject 40 units daily   insulin regular 250 units/2.67mL (100 units/mL) injection Commonly known as:  NOVOLIN R RELION Use 25 units in the morning and 35 units at supper   INSULIN SYRINGE 1CC/31GX5/16" 31G X 5/16" 1 ML Misc Use to inject insulin 4 times daily   montelukast 10 MG tablet Commonly known as:  SINGULAIR Take 10 mg by mouth at bedtime.   traMADol 50 MG tablet Commonly known as:  ULTRAM Take 1 tablet (50 mg total) by mouth every 12 (twelve) hours as needed.   VITAMIN B-12 PO Take 1 tablet by mouth daily.   VITAMIN D PO Take 1 tablet by mouth daily. Reported on 09/24/2015       Allergies:  Allergies  Allergen Reactions  . Codeine Sulfate Other (See Comments)  . Etodolac Other (See Comments)  . Oxycodone-Acetaminophen Other (See Comments)  . Sulfamethoxazole-Trimethoprim Other (See Comments)    Past Medical History:  Diagnosis Date  . Allergy   . Anemia   . Arthritis   . Asthma   . Blood transfusion   . Breast cancer [facility]) 2011   Right  . Cancer ([place])    right breast  . Complication of anesthesia    diff  waking up  . Cough   . Diabetes mellitus   . Generalized headaches   . Hyperlipidemia   . Hypertension    dr Ouida Sills  . Nasal congestion   . Neuromuscular disorder [facility])     Past Surgical History:  Procedure Laterality Date  . BACK SURGERY    . BREAST SURGERY  06/2007   right mastectomy  . BREAST SURGERY  2011   left  . FOOT SURGERY    . KIDNEY STONE SURGERY    .  MASTECTOMY Right 2011   with Radiaiton  . REDUCTION MAMMAPLASTY Left 2012  . SHOULDER ARTHROSCOPY     bilateral  . TOTAL KNEE ARTHROPLASTY      Family History  Problem Relation Age of Onset  . Osteoporosis Mother   .  Heart disease Father   . Cancer Brother   . Breast cancer Brother   . Heart disease Sister     Social History:  reports that she quit smoking about 32 years ago. She has never used smokeless tobacco. She reports that she drinks alcohol. She reports that she does not use drugs.  Review of Systems:   Chronic kidney disease: Creatinine has been  upper normal, also followed periodically by nephrologist Last urine microalbumin was high   Lab Results  Component Value Date   CREATININE 1.26 (H) 12/24/2015    BLOOD pressure: Tends to stay low-normal, not on any antihypertensives or diuretics She does not usually complain of lightheadedness on standing up   HYPERLIPIDEMIA: The lipid abnormality consists of elevated  triglycerides, has been treated with TriCor, last lipids are improved here but triglycerides over 400 occasionally    Lab Results  Component Value Date   CHOL 141 12/24/2015   HDL 42.10 12/24/2015   LDLCALC 32 12/05/2013   LDLDIRECT 70.0 12/24/2015   TRIG 206.0 (H) 12/24/2015   CHOLHDL 3 12/24/2015     She has chronic pains and paresthesia in her feet and lower legs Also has had constant back pain She did try tramadol on her last visit but she did not find this helpful, it does help her occasional headaches however  She now said that she is not taking her Cymbalta and not clear why She is saying that she is more irritable and feeling relatively low emotionally, has had difficulty with stress from her husband's decreased memory level  Also on gabapentin 600 mg   Last diabetic foot exam was in 8/17 during some sensory loss She is using comfortable soft shoes which she thinks helps discomfort  She has had a multinodular goiter, this has been  euthyroid  Lab Results  Component Value Date   TSH 1.14 03/20/2015        Examination:   BP (!) 110/55   Pulse 80   Ht 5\' 5"  (1.651 m)   Wt 172 lb (78 kg)   BMI 28.62 kg/m   Body mass index is 28.62 kg/m.     ASSESSMENT/ PLAN:   Diabetes type 2:  See history of present illness for detailed discussion of  current management, blood sugar patterns and problems identified She is on a regimen of NPH and Regular Insulin Although she was supposed to take NPH and instead of regular insulin at bedtime she misunderstood and is taking regular insulin at night Has not had hypoglycemia with this overnight  Blood sugars may be relatively better overall compared to her previous levels but still showing fluctuation  Insulin doses were adjusted based on her blood sugar patterns as below Discussed differences between the 2 insulins and timing of injections, duration of action and adjustment based on blood sugar level She will take a consistent dose of 45 units NPH in the morning and 25 at night If her morning sugars are consistently high or low or she can adjust bedtime dose She will take 40 units of regular in the morning and 40 at suppertime, may adjust based on blood sugar level in meal size  Low-normal blood pressure: She is not orthostatic, will continue to monitor  Hydrocodone 100 tablets given for her chronic pain, she can use her remaining tramadol as needed for headaches Also restart Cymbalta 60 mg daily for pain and depression   Patient Instructions  MORNING: 45  units of N/CLOUDY insulin and 40 of regular  At suppertime take 40-45 units of regular alone BASED ON MEAL SIZE  At bedtime take 25 units of N/CLOUDY ONLY   Counseling time on subjects discussed above is over 50% of today's 25 minute visit    Danford Tat 04/07/2016, 11:00 AM      Note: This office note was prepared with Estate agent. Any transcriptional errors that result from  this process are unintentional.

## 2016-04-07 NOTE — Patient Instructions (Signed)
MORNING: 45  units of N/CLOUDY insulin and 40 of regular  At suppertime take 40-45 units of regular alone BASED ON MEAL SIZE  At bedtime take 25 units of N/CLOUDY ONLY

## 2016-05-01 IMAGING — CT CT CHEST W/O CM
2 of 3 series · 15 of 36 positions shown, 18 images · non-contrast
Comparison: 09/26/2010

CLINICAL DATA: Patient c/o cough with SOB x 9 weeks. She states
some times she coughs to the point that she can't get her breath.
She states she has Left chest soreness. NKI. Hx Right Breast CA with
Mastectomy and Rad tx's.

EXAM:
CT CHEST WITHOUT CONTRAST
TECHNIQUE: Multidetector CT imaging of the chest was performed following the
standard protocol without IV contrast.

[Series 3: routine chest wo · axial · 0.68mm/px · z∈[-568,-314]mm · 12 of 61 slices shown, 15 images]
[im 5/61  mediastinal]
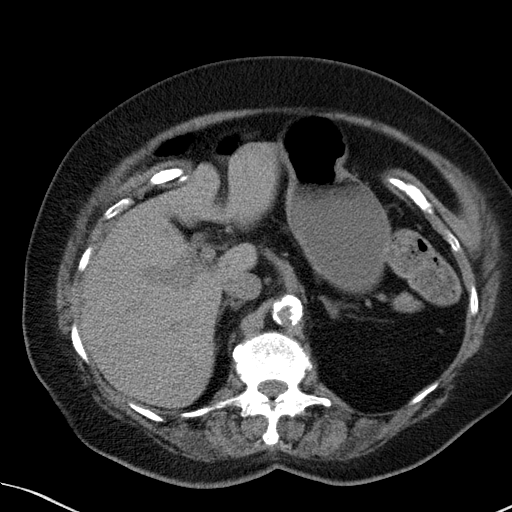
[im 5/61  lung]
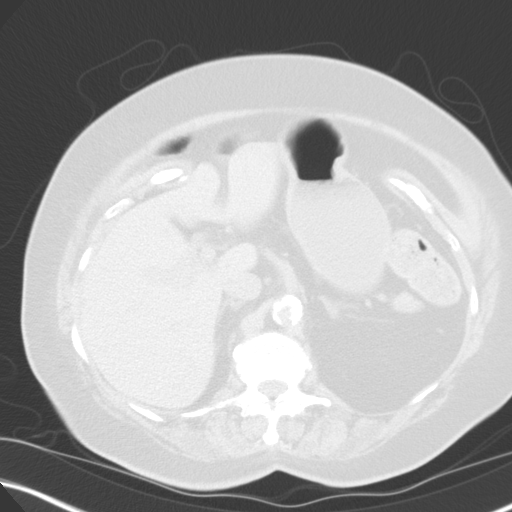
[im 9/61  lung]
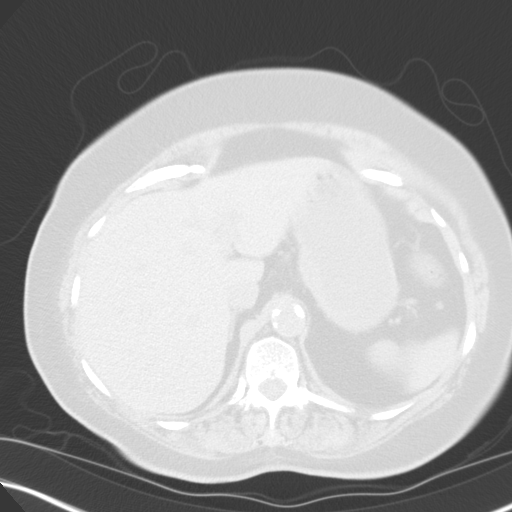
[im 14/61  lung]
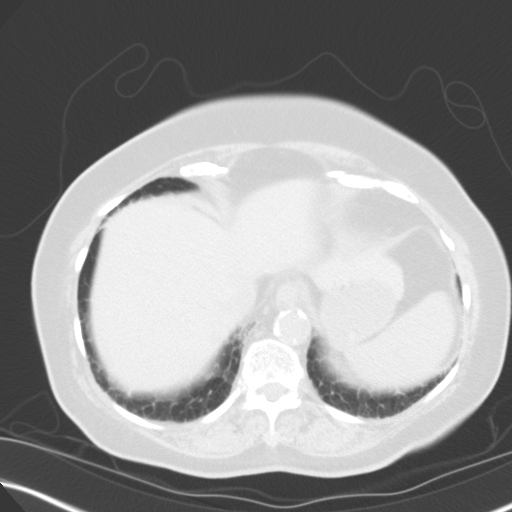
[im 18/61  lung]
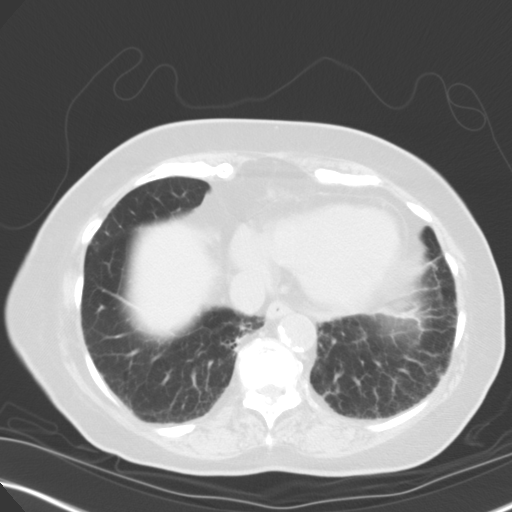
[im 23/61  mediastinal]
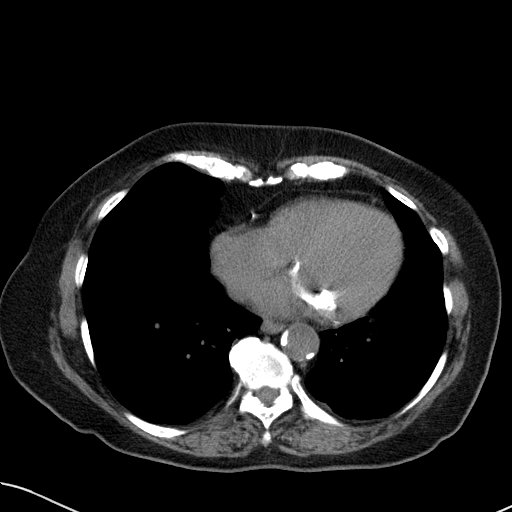
[im 23/61  lung]
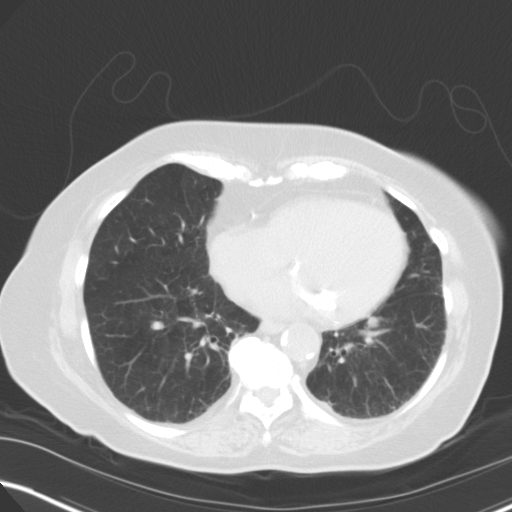
[im 27/61  lung]
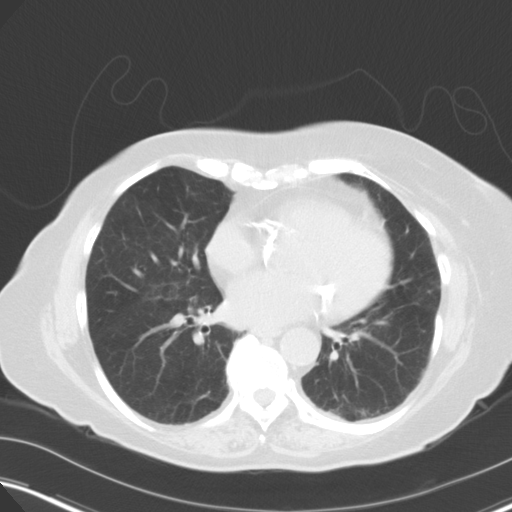
[im 34/61  lung]
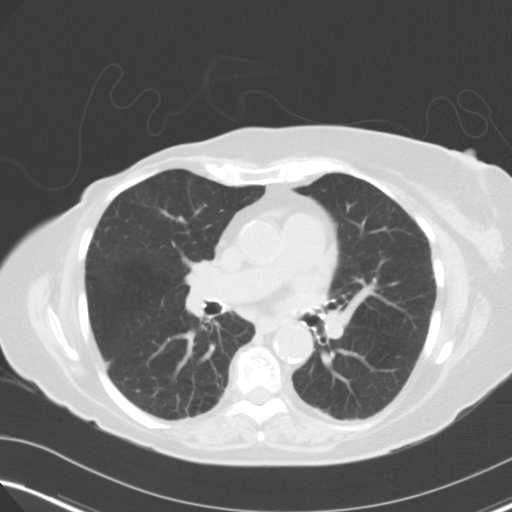
[im 38/61  lung]
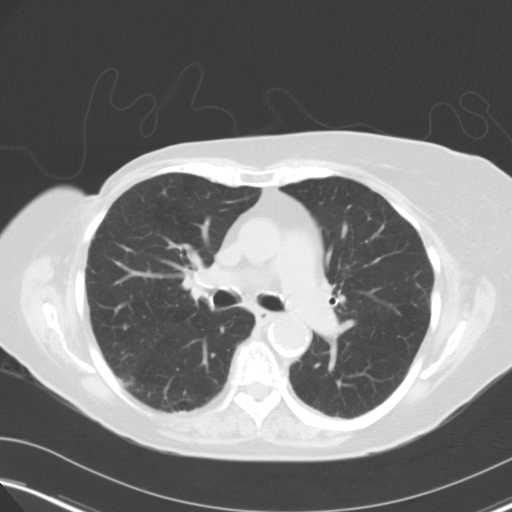
[im 43/61  mediastinal]
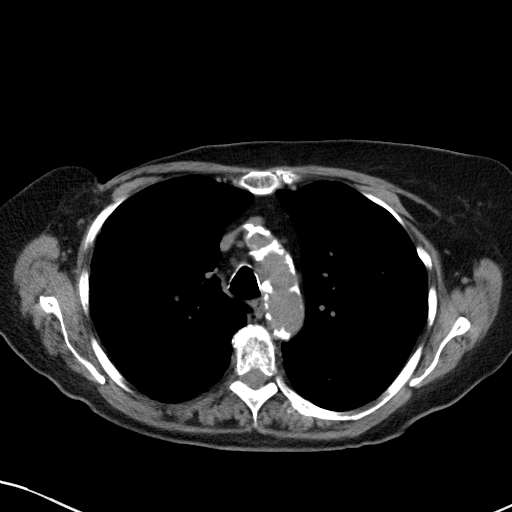
[im 43/61  lung]
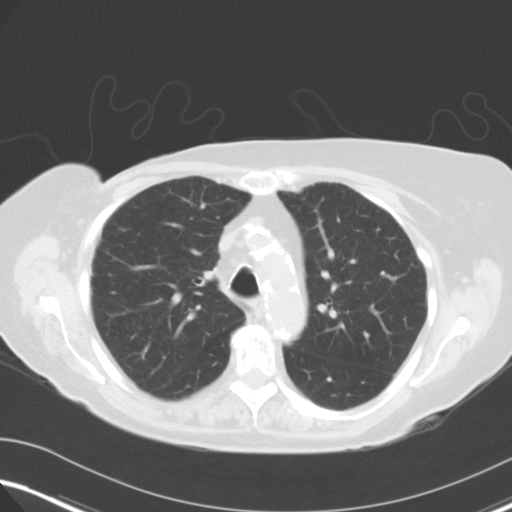
[im 47/61  lung]
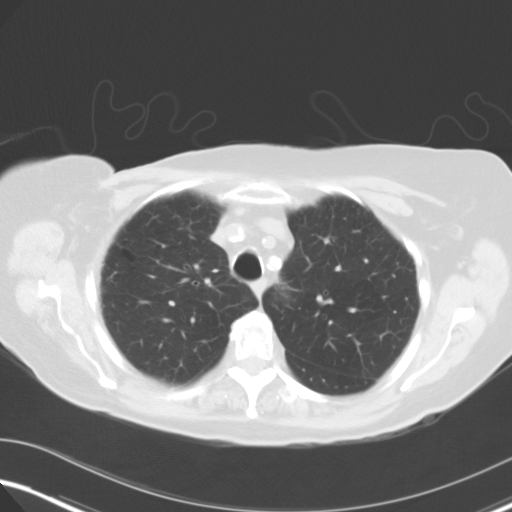
[im 52/61  lung]
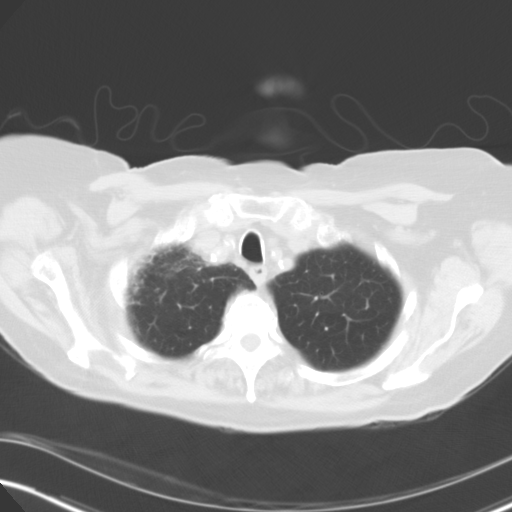
[im 56/61  lung]
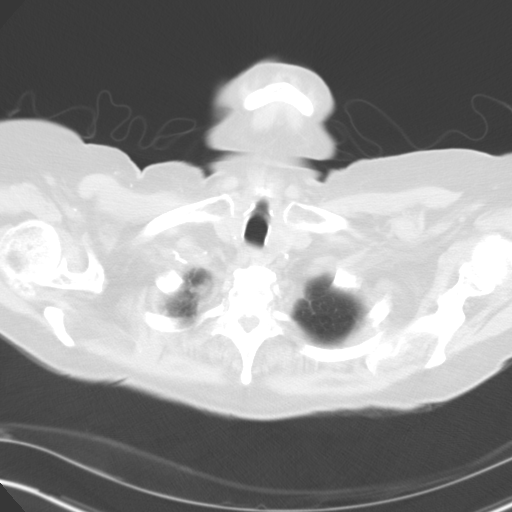

[Series 6: cor routine chest wo · coronal · 0.61mm/px · 3 of 151 slices shown]
[im 31/151  lung]
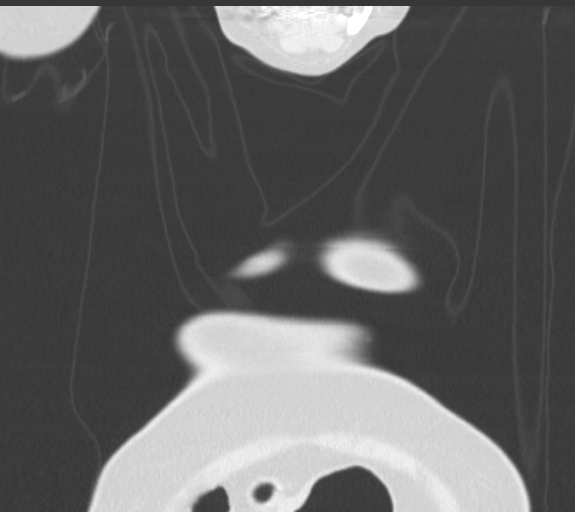
[im 61/151  lung]
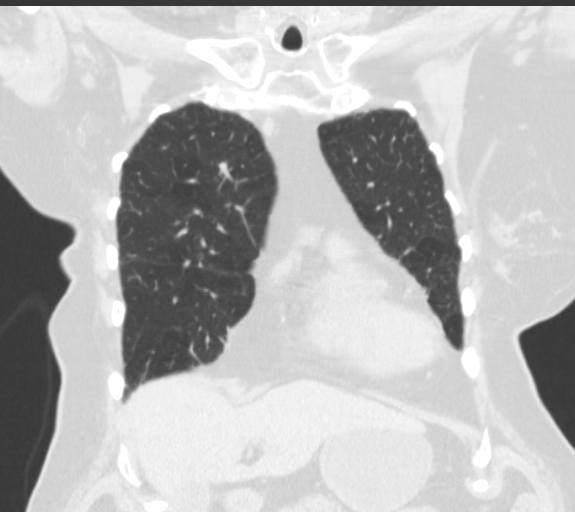
[im 91/151  lung]
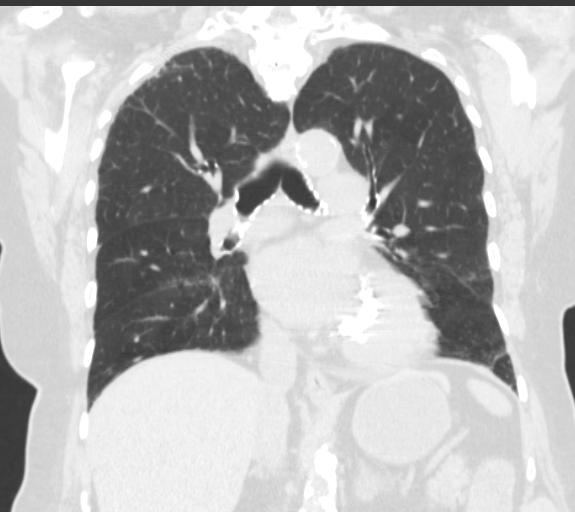

[15 of 36 positions shown; findings below may reference images not displayed]

FINDINGS: Heart: Heart size is normal. There is dense coronary artery
calcification. Dense mitral annulus and aortic arch calcification
present.

Vascular structures: There is dense atherosclerotic calcification of
the thoracic aorta and its branches. No aneurysm.

Mediastinum/thyroid: A benign-appearing low-attenuation left thyroid
nodule measures 9 mm. Small mediastinal lymph nodes are identified.
Precarinal lymph node is 9 mm. Other lymph nodes with are smaller.
No significant hilar or axillary adenopathy.

Lungs/Airways: There is a small fissural lymph node the right major
fissure, best seen on image 33 of series 5. At the left lung base
there is a small nodule measuring 8 x 12 mm. No pleural effusions or
pulmonary infiltrates are identified. There is mild bronchiectasis
in the medial right lung base, consistent with chronic mild
infectious process.

Upper abdomen: Visualized portions of the liver, adrenal glands are
normal in appearance. The gallbladder is present. There is laxity of
the anterior abdominal wall.

Chest wall/osseous structures: There are degenerative changes in the
thoracolumbar spine. Sclerotic change identified in the lower
portion of L1, likely degenerative. No suspicious lytic or blastic
lesions are identified. Status post right mastectomy.
IMPRESSION: 1. No acute abnormality of the chest.
2. Status post right mastectomy.
3. 12 x 8 mm nodule at the left lung base warrants follow-up.
Consider referral to Pulmonary Medicine for further evaluation.
Malignancy cannot be excluded.
4. Extensive coronary artery disease.
5. Mitral annulus and aortic valve disease.
6. Small, benign-appearing left thyroid nodule, not warranting
further evaluation.
7. Nonspecific small mediastinal lymph nodes.

## 2016-05-23 ENCOUNTER — Other Ambulatory Visit: Payer: Self-pay | Admitting: Endocrinology

## 2016-05-27 ENCOUNTER — Other Ambulatory Visit: Payer: Self-pay | Admitting: *Deleted

## 2016-05-27 MED ORDER — GLUCOSE BLOOD VI STRP: ORAL_STRIP | 5 refills | Status: DC

## 2016-06-09 ENCOUNTER — Ambulatory Visit (INDEPENDENT_AMBULATORY_CARE_PROVIDER_SITE_OTHER): Payer: Medicare Other | Admitting: Endocrinology

## 2016-06-09 ENCOUNTER — Other Ambulatory Visit: Payer: Self-pay

## 2016-06-09 ENCOUNTER — Encounter: Payer: Self-pay | Admitting: Endocrinology

## 2016-06-09 VITALS — BP 112/60 | HR 72 | Ht 65.0 in | Wt 167.0 lb

## 2016-06-09 DIAGNOSIS — E1142 Type 2 diabetes mellitus with diabetic polyneuropathy: Secondary | ICD-10-CM | POA: Diagnosis not present

## 2016-06-09 DIAGNOSIS — F320 Major depressive disorder, single episode, mild: Secondary | ICD-10-CM

## 2016-06-09 DIAGNOSIS — Z794 Long term (current) use of insulin: Secondary | ICD-10-CM | POA: Diagnosis not present

## 2016-06-09 DIAGNOSIS — E1165 Type 2 diabetes mellitus with hyperglycemia: Secondary | ICD-10-CM

## 2016-06-09 DIAGNOSIS — F32A Depression, unspecified: Secondary | ICD-10-CM

## 2016-06-09 LAB — POCT GLYCOSYLATED HEMOGLOBIN (HGB A1C): HEMOGLOBIN A1C: 7.3

## 2016-06-09 MED ORDER — FREESTYLE LIBRE READER DEVI: 1.0000 | Freq: Every day | 3 refills | Status: DC

## 2016-06-09 MED ORDER — FREESTYLE LIBRE SENSOR SYSTEM MISC: 1.0000 | 3 refills | Status: DC

## 2016-06-09 MED ORDER — DULOXETINE HCL 60 MG PO CPEP: 60.0000 mg | ORAL_CAPSULE | Freq: Every day | ORAL | 2 refills | Status: DC

## 2016-06-09 NOTE — Patient Instructions (Addendum)
Restart Cymbalta  For now reduce the N insulin/ cloudy insulin down  To 40 units in he morning and 20 at bedtime  R insulin take 35 units, 20-30 min before meals while having difficulty with appetite  When eating normally may increase the N insulin up to 50 units in the morning but continues 25 at bedtime  Call if having excessive low sugar  Try to have a snack with protein at bedtime daily

## 2016-06-09 NOTE — Progress Notes (Signed)
Patient ID: Andrea Wallace, female   DOB: 06/16/1931, 83 y.o.   MRN: [id number]   Reason for Appointment: Diabetes follow-up   History of Present Illness   Diagnosis: Type 2 DIABETES MELITUS, date of diagnosis 1999  Previous history: She has been on insulin for several years to control her diabetes and usually requires large doses Has been on basal bolus regimen for the last few years, taking Lantus twice a day.  Her blood sugars tend to fluctuate significantly but her A1c is usually at a reasonable level She does not clearly benefit from GLP-1 drugs and had been taking Byetta to help with postprandial hyperglycemia, this has been stopped  She had been tried on Victoza, Byetta and Invokana previously and these were  stopped because of unclear benefit       RECENT history:  Insulin regimen: Novolin N 40-45 in a.m.  REGULAR insulin 45 at breakfast, 40 at supper and 25-30 at bedtime  Her blood sugars  have been difficult to manage and with requiring large doses of mealtime insulin  Recommendations on the last visit: .She will take a consistent dose of 45 units NPH in the morning and 25 at night If her morning sugars are consistently high or low or she can adjust bedtime dose She will take 40 units of regular in the morning and 40 at suppertime, may adjust based on blood sugar level in meal size  Current management, blood sugar patterns and problems identified:  Although her blood sugars had been higher a couple of weeks ago they are relatively lower in the last few days  This is likely to be from her having post prandial abdominal pain and she is somewhat afraid to eat now  Still has overall a high average blood sugar of 179  She is still having significant fluctuation in her blood sugar at various times  FASTING blood sugars are recently better and has had a couple of low sugars also at still has some readings that are high, lowest blood sugar 45 last Saturday  Most of  her high readings are before and after supper, more consistently after supper late at night  She is not adjusting her insulin based on what she is eating  Again she does not remember how much insulin she is taking much thinks she is following instructions given on the last visit  In the last week has had a couple of readings in the 70s between 4-7 PM; not clear which readings in the evenings are before or after supper.  Has had a sporadic low sugar at all different times of the day but more in the morning and only twice a day and once in the evening  She is again wanting to continue the generic insulins because of cost  Again generally not eating much in the middle of the day and mostly eating 2 meals a day  She is generally compliant with taking her insulin before meals; the timing of insulin is variable  Blood sugar readings by  download as follows  Mean values apply above for all meters except median for One Touch  PRE-MEAL Fasting Lunch 3-9 PM  Bedtime Overall  Glucose range: 45-263  52-206  53-326  57-354    Mean/median: 152  150  185  208  179+/-74     Oral hypoglycemic drugs: Metformin ER, 500 mg 2 a day       Side effects from medications: None  Proper timing of medications in  relation to meals: Yes.         Monitors blood glucose:  2-3 times a day on average.    Glucometer: Freestyle          Meals: 3 meals per day.   at breakfast she is eating a  Peanut butter toast.  1/2 Sandwich or boiled egg at lunch, small portions at supper          Physical activity: exercise: none, has back pain            Dietician visit: Most recent: Years ago       Complications: are: Neuropathy, microalbuminuria     Wt Readings from Last 3 Encounters:  06/09/16 167 lb (75.8 kg)  04/07/16 172 lb (78 kg)  02/25/16 170 lb (77.1 kg)     Lab Results  Component Value Date   HGBA1C 7.3 06/09/2016   HGBA1C 8.3 12/24/2015   HGBA1C 7.8 09/24/2015   Lab Results  Component Value Date    MICROALBUR 122.6 (H) 12/24/2015   LDLCALC 32 12/05/2013   CREATININE 1.26 (H) 12/24/2015     Allergies as of 06/09/2016      Reactions   Codeine Sulfate Other (See Comments)   Etodolac Other (See Comments)   Oxycodone-acetaminophen Other (See Comments)   Sulfamethoxazole-trimethoprim Other (See Comments)      Medication List       Accurate as of 06/09/16  2:45 PM. Always use your most recent med list.          acetaminophen 500 MG tablet Commonly known as:  TYLENOL Take 500 mg by mouth every 6 (six) hours as needed.   aspirin 81 MG tablet Take 81 mg by mouth daily.   CRESTOR 40 MG tablet Generic drug:  rosuvastatin Take 40 mg by mouth daily.   DULoxetine 60 MG capsule Commonly known as:  CYMBALTA Take 1 capsule (60 mg total) by mouth daily.   FREESTYLE LIBRE READER Devi 1 Device by Does not apply route daily. [place] 1 Device by Does not apply route every 14 (fourteen) days. [id number]   gabapentin 600 MG tablet Commonly known as:  NEURONTIN TAKE ONE TABLET BY MOUTH THREE TIMES DAILY   glucose blood test strip Commonly known as:  FREESTYLE LITE USE ONE STRIP TO CHECK GLUCOSE THREE TIMES DAILY   HYDROcodone-acetaminophen 10-325 MG tablet Commonly known as:  NORCO Take 1 tablet by mouth 2 (two) times daily as needed for moderate pain.   insulin NPH Human 100 UNIT/ML injection Commonly known as:  NOVOLIN N RELION Inject 40 units daily   insulin regular 250 units/2.75mL (100 units/mL) injection Commonly known as:  NOVOLIN R RELION Use 25 units in the morning and 35 units at supper   INSULIN SYRINGE 1CC/31GX5/16" 31G X 5/16" 1 ML Misc Use to inject insulin 4 times daily   montelukast 10 MG tablet Commonly known as:  SINGULAIR Take 10 mg by mouth at bedtime.   traMADol 50 MG tablet Commonly known as:  ULTRAM Take 1 tablet (50 mg total) by mouth every 12 (twelve) hours as needed.   VITAMIN B-12 PO Take  1 tablet by mouth daily.   VITAMIN D PO Take 1 tablet by mouth daily. Reported on 09/24/2015       Allergies:  Allergies  Allergen Reactions  . Codeine Sulfate Other (See Comments)  . Etodolac Other (See Comments)  . Oxycodone-Acetaminophen Other (See Comments)  . Sulfamethoxazole-Trimethoprim Other (See Comments)  Past Medical History:  Diagnosis Date  . Allergy   . Anemia   . Arthritis   . Asthma   . Blood transfusion   . Breast cancer [facility]) 2011   Right  . Cancer ([place])    right breast  . Complication of anesthesia    diff  waking up  . Cough   . Diabetes mellitus   . Generalized headaches   . Hyperlipidemia   . Hypertension    dr [place]  . Nasal congestion   . Neuromuscular disorder [facility])     Past Surgical History:  Procedure Laterality Date  . BACK SURGERY    . BREAST SURGERY  06/2007   right mastectomy  . BREAST SURGERY  2011   left  . FOOT SURGERY    . KIDNEY STONE SURGERY    . MASTECTOMY Right 2011   with Radiaiton  . REDUCTION MAMMAPLASTY Left 2012  . SHOULDER ARTHROSCOPY     bilateral  . TOTAL KNEE ARTHROPLASTY      Family History  Problem Relation Age of Onset  . Osteoporosis Mother   . Heart disease Father   . Cancer Brother   . Breast cancer Brother   . Heart disease Sister     Social History:  reports that she quit smoking about 32 years ago. She has never used smokeless tobacco. She reports that she drinks alcohol. She reports that she does not use drugs.  Review of Systems:  For the last 2-4 weeks is having abdominal pain after eating and is due to see her PCP in about a week  Chronic kidney disease: Creatinine has been  upper normal,  followed periodically by nephrologist Last urine microalbumin was high No change in management recommended by nephrologist  Lab Results  Component Value Date   CREATININE 1.26 (H) 12/24/2015    BLOOD pressure:  not on any antihypertensives or diuretics She does not usually complain of  lightheadedness on standing up   HYPERLIPIDEMIA: The lipid abnormality consists of elevated  triglycerides, has been treated with TriCor Also followed by PCP    Lab Results  Component Value Date   CHOL 141 12/24/2015   HDL 42.10 12/24/2015   LDLCALC 32 12/05/2013   LDLDIRECT 70.0 12/24/2015   TRIG 206.0 (H) 12/24/2015   CHOLHDL 3 12/24/2015     She has chronic pains and paresthesia in her feet and lower legs Also has had constant back pain  She now said that she is not taking her Cymbalta and not clear why the pharmacy did not feel this for her on the last visit Has had some depressive type symptoms also   Also on gabapentin 600 mg   Last diabetic foot exam was in 8/17 during some sensory loss She is using comfortable soft shoes which she thinks helps discomfort  She has had a multinodular goiter, this has been euthyroid  Lab Results  Component Value Date   TSH 1.14 03/20/2015        Examination:   BP 112/60   Pulse 72   Ht 5\' 5"  (1.651 m)   Wt 167 lb (75.8 kg)   SpO2 91%   BMI 27.79 kg/m   Body mass index is 27.79 kg/m.     ASSESSMENT/ PLAN:   Diabetes type 2:  See history of present illness for detailed discussion of  current management, blood sugar patterns and problems identified She is on a regimen of NPH and Regular Insulin  Her blood sugars are fluctuating  significantly at all times especially in the evenings Again requiring relatively large doses of insulin for her weight  She wants to continue NPH and regular despite inconsistent control and cranial hypoglycemia because of the cost She thinks she is following instructions for the doses as given on the last visit  More recently because of her abdominal pain and reduced intake she is requiring lower doses of insulin but only is having rare hypoglycemia including a reading of 45 in the morning  Recommendations:  Temporarily reduce the NPH by 5 units both morning and evening  Reduce regular  insulin to 35 units  Been eating normally will go up to 15 at for the morning NPH since she denies to have high readings at suppertime  Also may need 45 units of regular at suppertime especially sugars are consistently high after eating  She will not increase her NPH at bedtime even when eating normally  More consistent monitoring  Will restart Cymbalta 60 mg daily for pain and depression , new prescription sent  Patient Instructions  Restart Cymbalta  For now reduce the N insulin/ cloudy insulin down  To 40 units in he morning and 20 at bedtime  R insulin take 35 units, 20-30 min before meals while having difficulty with appetite  When eating normally may increase the N insulin up to 50 units in the morning but continues 25 at bedtime  Call if having excessive low sugar  Try to have a snack with protein at bedtime daily       Counseling time on subjects discussed above is over 50% of today's 25 minute visit    [place] 06/09/2016, 2:45 PM      Note: This office note was prepared with Dragon voice recognition system technology. Any transcriptional errors that result from this process are unintentional.

## 2016-06-12 ENCOUNTER — Other Ambulatory Visit: Payer: Self-pay

## 2016-06-12 ENCOUNTER — Telehealth: Payer: Self-pay | Admitting: Endocrinology

## 2016-06-12 MED ORDER — INSULIN REGULAR HUMAN 100 UNIT/ML IJ SOLN: INTRAMUSCULAR | 3 refills | Status: DC

## 2016-06-12 MED ORDER — INSULIN NPH (HUMAN) (ISOPHANE) 100 UNIT/ML ~~LOC~~ SUSP: SUBCUTANEOUS | 3 refills | Status: DC

## 2016-06-12 NOTE — Telephone Encounter (Signed)
Ordered 06/12/16

## 2016-06-12 NOTE — Telephone Encounter (Signed)
Patient need refill of meds, but didn't say which one she needed.

## 2016-06-12 NOTE — Telephone Encounter (Signed)
Pt needs insulin called in please relion and the novolin N, Novolin R called into [facility] # [phone]

## 2016-06-15 ENCOUNTER — Other Ambulatory Visit: Payer: Self-pay | Admitting: Internal Medicine

## 2016-06-15 DIAGNOSIS — R1084 Generalized abdominal pain: Secondary | ICD-10-CM

## 2016-06-24 ENCOUNTER — Ambulatory Visit
Admission: RE | Admit: 2016-06-24 | Discharge: 2016-06-24 | Disposition: A | Discharge status: Home / Self Care | Payer: Medicare Other | Source: Ambulatory Visit | Attending: Internal Medicine | Admitting: Internal Medicine

## 2016-06-24 DIAGNOSIS — R143 Flatulence: Secondary | ICD-10-CM | POA: Diagnosis not present

## 2016-06-24 DIAGNOSIS — R1084 Generalized abdominal pain: Secondary | ICD-10-CM

## 2016-06-24 DIAGNOSIS — K828 Other specified diseases of gallbladder: Secondary | ICD-10-CM | POA: Insufficient documentation

## 2016-06-24 DIAGNOSIS — R93421 Abnormal radiologic findings on diagnostic imaging of right kidney: Secondary | ICD-10-CM | POA: Insufficient documentation

## 2016-06-26 ENCOUNTER — Other Ambulatory Visit: Payer: Self-pay | Admitting: Internal Medicine

## 2016-06-26 DIAGNOSIS — R1084 Generalized abdominal pain: Secondary | ICD-10-CM

## 2016-07-03 ENCOUNTER — Ambulatory Visit
Admission: RE | Admit: 2016-07-03 | Discharge: 2016-07-03 | Disposition: A | Discharge status: Home / Self Care | Payer: Medicare Other | Source: Ambulatory Visit | Attending: Internal Medicine | Admitting: Internal Medicine

## 2016-07-03 DIAGNOSIS — M5135 Other intervertebral disc degeneration, thoracolumbar region: Secondary | ICD-10-CM | POA: Diagnosis not present

## 2016-07-03 DIAGNOSIS — R1084 Generalized abdominal pain: Secondary | ICD-10-CM

## 2016-07-03 DIAGNOSIS — I700 Atherosclerosis of aorta: Secondary | ICD-10-CM | POA: Diagnosis not present

## 2016-07-03 DIAGNOSIS — K429 Umbilical hernia without obstruction or gangrene: Secondary | ICD-10-CM | POA: Diagnosis not present

## 2016-07-03 DIAGNOSIS — N2889 Other specified disorders of kidney and ureter: Secondary | ICD-10-CM | POA: Insufficient documentation

## 2016-07-03 MED ORDER — IOPAMIDOL (ISOVUE-300) INJECTION 61%
85.0000 mL | Freq: Once | INTRAVENOUS | Status: AC | PRN
  Administered 2016-07-03: 85 mL via INTRAVENOUS

## 2016-07-28 ENCOUNTER — Encounter (INDEPENDENT_AMBULATORY_CARE_PROVIDER_SITE_OTHER): Payer: Self-pay

## 2016-07-28 ENCOUNTER — Ambulatory Visit (INDEPENDENT_AMBULATORY_CARE_PROVIDER_SITE_OTHER): Payer: Medicare Other | Admitting: Vascular Surgery

## 2016-07-28 ENCOUNTER — Other Ambulatory Visit: Payer: Self-pay

## 2016-07-28 ENCOUNTER — Encounter (INDEPENDENT_AMBULATORY_CARE_PROVIDER_SITE_OTHER): Payer: Self-pay | Admitting: Vascular Surgery

## 2016-07-28 ENCOUNTER — Telehealth: Payer: Self-pay | Admitting: Endocrinology

## 2016-07-28 VITALS — BP 133/58 | HR 78 | Resp 15 | Ht 65.0 in | Wt 158.0 lb

## 2016-07-28 DIAGNOSIS — E1165 Type 2 diabetes mellitus with hyperglycemia: Secondary | ICD-10-CM | POA: Diagnosis not present

## 2016-07-28 DIAGNOSIS — K551 Chronic vascular disorders of intestine: Secondary | ICD-10-CM | POA: Diagnosis not present

## 2016-07-28 DIAGNOSIS — R109 Unspecified abdominal pain: Secondary | ICD-10-CM | POA: Insufficient documentation

## 2016-07-28 DIAGNOSIS — E118 Type 2 diabetes mellitus with unspecified complications: Secondary | ICD-10-CM

## 2016-07-28 DIAGNOSIS — R1084 Generalized abdominal pain: Secondary | ICD-10-CM

## 2016-07-28 DIAGNOSIS — I10 Essential (primary) hypertension: Secondary | ICD-10-CM

## 2016-07-28 DIAGNOSIS — E785 Hyperlipidemia, unspecified: Secondary | ICD-10-CM | POA: Diagnosis not present

## 2016-07-28 MED ORDER — HYDROCODONE-ACETAMINOPHEN 10-325 MG PO TABS: 1.0000 | ORAL_TABLET | Freq: Two times a day (BID) | ORAL | 0 refills | Status: DC | PRN

## 2016-07-28 NOTE — Telephone Encounter (Signed)
Ordered

## 2016-07-28 NOTE — Patient Instructions (Signed)
Chronic Mesenteric Ischemia Mesenteric ischemia is poor blood flow (circulation) in the vessels that supply blood to the stomach, intestines, and liver (mesenteric organs). Chronic mesenteric ischemia, also called mesenteric angina or intestinal angina, is a long-term (chronic) condition. It happens when an artery or vein that provides blood to the mesenteric organs gradually becomes blocked or narrow, restricting the blood supply to the organs. When the blood supply is severely restricted, the mesenteric organs cannot work properly. What are the causes? This condition is commonly caused by fatty deposits that build up in an artery (plaque), which can narrow the artery and restrict blood flow. Other causes include:  Weakened areas in blood vessel walls (aneurysms).  Conditions that cause twisting or inflammation of blood vessels, such as fibromuscular dysplasia or arteritis.  A disorder in which blood clots form in the veins (venous thrombosis).  Scarring and thickening (fibrosis) of blood vessels caused by radiation therapy.  A tear in the aorta, the body's main artery (aortic dissection).  Blood vessel problems after illegal drug use, such as use of cocaine.  Tumors in the nervous system (neurofibromatosis).  Certain autoimmune diseases, such as lupus.  What increases the risk? The following factors may make you more likely to develop this condition:  Being female.  Being over age 50, especially if you have a history of heart problems.  Smoking.  Congestive heart failure.  Irregular heartbeat (arrhythmia).  Having a history of heart attack or stroke.  Diabetes.  High cholesterol.  High blood pressure (hypertension).  Being overweight or obese.  Kidney disease (renal disease) requiring dialysis.  What are the signs or symptoms? Symptoms of this condition include:  Abdomen (abdominal) pain or cramps that develop 15-60 minutes after a meal. This pain may last for 1-3  hours. Some people may develop a fear of eating because of this symptom.  Weight loss.  Diarrhea.  Bloody stool.  Nausea.  Vomiting.  Bloating.  Abdominal pain after stress or with exercise.  How is this diagnosed? This condition is diagnosed based on:  Your medical history.  A physical exam.  Tests, such as: ? Ultrasound. ? CT scan. ? Blood tests. ? Urine tests. ? An imaging test that involves injecting a dye into your arteries to show blood flow through blood vessels (angiogram). This can help to show if there are any blockages in the vessels that lead to the intestines. ? Passing a small probe through the mouth and into the stomach to measure the output of carbon dioxide (gastric tonometry). This can help to indicate whether there is decreased blood flow to the stomach and intestines.  How is this treated? This condition may be treated with:  Dietary changes such as eating smaller, low-fat, meals more frequently.  Lifestyle changes to treat underlying conditions that contribute to the disease, such as high cholesterol and high blood pressure.  Medicines to reduce blood clotting and increase blood flow.  Surgery to remove the blockage, repair arteries or veins, and restore blood flow. This may involve: ? Angioplasty. This is surgery to widen the affected artery, reduce the blockage, and sometimes insert a small, mesh tube (stent). ? Bypass surgery. This may be done to go around (bypass) the blockage and reconnect healthy arteries or veins. ? Placing a stent in the affected area. This may be done to help keep blocked arteries open.  Follow these instructions at home: Eating and drinking  Eat a heart-healthy diet. This includes fresh fruits and vegetables, whole grains, and lean proteins   like chicken, fish, eggs, and beans.  Avoid foods that contain a lot of: ? Salt (sodium). ? Sugar. ? Saturated fat (such as red meat). ? Trans fat (such as fried foods).  Stay  hydrated. Drink enough fluid to keep your urine clear or pale yellow. Lifestyle  Stay active and get regular exercise as told by your health care provider. Aim for 150 minutes of moderate activity or 75 minutes of vigorous activity a week. Ask your health care provider what activities and forms of exercise are safe for you.  Maintain a healthy weight.  Work with your health care provider to manage your cholesterol.  Manage any other health problems you have, such as high blood pressure, diabetes, or heart rhythm problems.  Do not use any products that contain nicotine or tobacco, such as cigarettes and e-cigarettes. If you need help quitting, ask your health care provider. General instructions  Take over-the-counter and prescription medicines only as told by your health care provider.  Keep all follow-up visits as told by your health care provider. This is important. Contact a health care provider if:  Your symptoms do not improve or they return after treatment.  You have a fever. Get help right away if:  You have severe abdominal pain.  You have severe chest pain.  You have shortness of breath.  You feel weak or dizzy.  You have palpitations.  You have numbness or weakness in your face, arm, or leg.  You are confused.  You have trouble speaking or people have trouble understanding what you are saying.  You are constipated.  You have trouble urinating.  You have blood in your stool.  You have severe nausea, vomiting, or persistent diarrhea. Summary  Mesenteric ischemia is poor circulation in the vessels that supply blood to the the stomach, intestines, and liver (mesenteric organs).  This condition happens when an artery or vein that provides blood to the mesenteric organs gradually becomes blocked or narrow, restricting the blood supply to the organs.  This condition is commonly caused by fatty deposits that build up in an artery (plaque), which can narrow the  artery and restrict blood flow.  You are more likely to develop this condition if you are over age 50 and have a history of heart problems, high blood pressure, diabetes, or high cholesterol.  This condition is usually treated with medicines, dietary and lifestyle changes, and surgery to remove the blockage, repair arteries or veins, and restore blood flow. This information is not intended to replace advice given to you by your health care provider. Make sure you discuss any questions you have with your health care provider. Document Released: 12/29/2010 Document Revised: 04/25/2016 Document Reviewed: 04/25/2016 Elsevier Interactive Patient Education  2017 [employer]  

## 2016-07-28 NOTE — Telephone Encounter (Signed)
Refill  HYDROcodone-acetaminophen (NORCO) 10-325 MG tablet 100 tablet

## 2016-07-28 NOTE — Assessment & Plan Note (Signed)
lipid control important in reducing the progression of atherosclerotic disease. Continue statin therapy  

## 2016-07-28 NOTE — Assessment & Plan Note (Signed)
Although her symptoms are not entirely clear in their etiology, certainly chronic visceral ischemia has to be considered given her clinical scenario of food fear, unintentional weight loss, and abdominal pain which is postprandial in nature. CT scan which I have independently reviewed. This scan was performed with contrast although it was reasonably low quality for the arterial phase. Interestingly, the radiologist only commented that there was aortic atherosclerosis. It took the gastroenterologist reviewing the scan to recognize that potential mesenteric artery disease was present. In my review of the scan the origin of the SMA is not well seen but is highly calcific. The celiac artery appears to have calcific areas at the origin although it does not appear to be highly stenotic. The IMA origin is also very calcific and possibly stenotic. None of this was reported by the radiologist. At this point, given the serious nature of the situation I think it would be reasonable to proceed with mesenteric angiogram with possible revascularization. Risks and benefits were discussed with the patient and she is agreeable to proceed.

## 2016-07-28 NOTE — Assessment & Plan Note (Signed)
blood pressure control important in reducing the progression of atherosclerotic disease. On appropriate oral medications.  

## 2016-07-28 NOTE — Assessment & Plan Note (Signed)
Although not entirely clear at its etiology, certainly chronic visceral ischemia has to be considered given her clinical scenario of food fear, unintentional weight loss, and abdominal pain which is postprandial in nature. Her CT scan is consistent with that being a possibility as well.

## 2016-07-28 NOTE — Progress Notes (Signed)
Patient ID: Andrea Wallace, female   DOB: Mar 18, 1932, 83 y.o.   MRN: [id number]  Chief Complaint  Patient presents with  . New Evaluation    IMA stenosis    HPI Andrea Wallace is a 83 y.o. female.  I am asked to see the patient by J. Woodard, PA-C for evaluation of mesenteric artery stenosis.  The patient reports Several months of postprandial abdominal pain and unintentional weight loss. She is a reasonably small person, but has lost 18 pounds over the past 4-5 months. This was not intentional. She says that after her essentially every meal, she has epigastric abdominal pain that radiates to her lower abdomen. Nothing really makes this better. The matter how much she eats, she gets pain. This has resulted in food fear and a poor appetite. There was no clear inciting event or causative factor the start of her symptoms. She denies fever or chills. She has had less frequent bowel movements but no real change in terms of the consistency of her bowel movements. She had an ultrasound looking for intra-abdominal pathology and when there were questionable findings on the ultrasound for some reason the radiologist recommended a CT scan. She then had a CT scan which I have independently reviewed. This scan was performed with contrast although it was reasonably low quality for the arterial phase. Interestingly, the radiologist only commented that there was aortic atherosclerosis. It took the gastroenterologist reviewing the scan to recognize that potential mesenteric artery disease was present. In my review of the scan the origin of the SMA is not well seen but is highly calcific. The celiac artery appears to have calcific areas at the origin although it does not appear to be highly stenotic. The IMA origin is also very calcific and possibly stenotic. None of this was reported by the radiologist.   Past Medical History:  Diagnosis Date  . Allergy   . Anemia   . Arthritis   . Asthma   . Blood transfusion    . Breast cancer [facility]) 2011   Right  . Cancer ([place])    right breast  . Complication of anesthesia    diff  waking up  . Cough   . Diabetes mellitus   . Generalized headaches   . Hyperlipidemia   . Hypertension    dr [place]  . Nasal congestion   . Neuromuscular disorder [facility])     Past Surgical History:  Procedure Laterality Date  . BACK SURGERY    . BREAST SURGERY  06/2007   right mastectomy  . BREAST SURGERY  2011   left  . FOOT SURGERY    . KIDNEY STONE SURGERY    . MASTECTOMY Right 2011   with Radiaiton  . REDUCTION MAMMAPLASTY Left 2012  . SHOULDER ARTHROSCOPY     bilateral  . TOTAL KNEE ARTHROPLASTY      Family History  Problem Relation Age of Onset  . Osteoporosis Mother   . Heart disease Father   . Cancer Brother   . Breast cancer Brother   . Heart disease Sister     Social History Social History  Substance Use Topics  . Smoking status: Former Smoker    Quit date: 03/15/1984  . Smokeless tobacco: Never Used  . Alcohol use Yes     Comment: occ  No IVDU  Allergies  Allergen Reactions  . Codeine Sulfate Other (See Comments)  . Etodolac Other (See Comments)  . Oxycodone-Acetaminophen Other (See Comments)  .  Sulfamethoxazole-Trimethoprim Other (See Comments)    Current Outpatient Prescriptions  Medication Sig Dispense Refill  . acetaminophen (TYLENOL) 500 MG tablet Take 500 mg by mouth every 6 (six) hours as needed.    Marland Kitchen aspirin 81 MG tablet Take 81 mg by mouth daily.    . Cholecalciferol (VITAMIN D PO) Take 1 tablet by mouth daily. Reported on 09/24/2015    . Continuous Blood Gluc Receiver (FREESTYLE LIBRE READER) DEVI 1 Device by Does not apply route daily. [id number] 1 Device 3  . Continuous Blood Gluc Sensor (FREESTYLE LIBRE SENSOR SYSTEM) MISC 1 Device by Does not apply route every 14 (fourteen) days. [id number] 30 each 3  . CRESTOR 40 MG tablet Take 40 mg by mouth daily.     . Cyanocobalamin (VITAMIN B-12 PO) Take 1 tablet by mouth  daily.    . DULoxetine (CYMBALTA) 60 MG capsule Take 1 capsule (60 mg total) by mouth daily. 30 capsule 2  . gabapentin (NEURONTIN) 600 MG tablet TAKE ONE TABLET BY MOUTH THREE TIMES DAILY 90 tablet 5  . glucose blood (FREESTYLE LITE) test strip USE ONE STRIP TO CHECK GLUCOSE THREE TIMES DAILY 100 each 5  . insulin NPH Human (NOVOLIN N RELION) 100 UNIT/ML injection Inject 40 units daily 10 mL 3  . insulin regular (NOVOLIN R RELION) 250 units/2.97mL (100 units/mL) injection Use 25 units in the morning and 35 units at supper 20 mL 3  . Insulin Syringe-Needle U-100 (INSULIN SYRINGE 1CC/31GX5/16") 31G X 5/16" 1 ML MISC Use to inject insulin 4 times daily 200 each 3  . montelukast (SINGULAIR) 10 MG tablet Take 10 mg by mouth at bedtime.     . traMADol (ULTRAM) 50 MG tablet Take 1 tablet (50 mg total) by mouth every 12 (twelve) hours as needed. 60 tablet 0  . HYDROcodone-acetaminophen (NORCO) 10-325 MG tablet Take 1 tablet by mouth 2 (two) times daily as needed for moderate pain. 60 tablet 0   No current facility-administered medications for this visit.       REVIEW OF SYSTEMS (Negative unless checked)  Constitutional: [] Weight loss  [] Fever  [] Chills Cardiac: [] Chest pain   [] Chest pressure   [] Palpitations   [] Shortness of breath when laying flat   [] Shortness of breath at rest   [] Shortness of breath with exertion. Vascular:  [] Pain in legs with walking   [] Pain in legs at rest   [] Pain in legs when laying flat   [] Claudication   [] Pain in feet when walking  [] Pain in feet at rest  [] Pain in feet when laying flat   [] History of DVT   [] Phlebitis   [] Swelling in legs   [] Varicose veins   [] Non-healing ulcers Pulmonary:   [] Uses home oxygen   [] Productive cough   [] Hemoptysis   [] Wheeze  [] COPD   [] Asthma Neurologic:  [] Dizziness  [] Blackouts   [] Seizures   [] History of stroke   [] History of TIA  [] Aphasia   [] Temporary blindness   [] Dysphagia   [] Weakness or numbness in arms   [] Weakness or numbness  in legs Musculoskeletal:  [] Arthritis   [] Joint swelling   [] Joint pain   [] Low back pain Hematologic:  [] Easy bruising  [] Easy bleeding   [] Hypercoagulable state   [] Anemic  [] Hepatitis Gastrointestinal:  [] Blood in stool   [] Vomiting blood  [] Gastroesophageal reflux/heartburn   [] Abdominal pain Genitourinary:  [] Chronic kidney disease   [] Difficult urination  [] Frequent urination  [] Burning with urination   [] Hematuria Skin:  [] Rashes   [] Ulcers   [] Wounds  Psychological:  [] History of anxiety   []  History of major depression.    Physical Exam BP (!) 133/58 (BP Location: Left Arm)   Pulse 78   Resp 15   Ht 5\' 5"  (1.651 m)   Wt 158 lb (71.7 kg)   BMI 26.29 kg/m  Gen:  WD/WN, NAD. Appears younger than stated age. Head: [place]/AT, No temporalis wasting.  Ear/Nose/Throat: Hearing grossly intact, nares w/o erythema or drainage, oropharynx w/o Erythema/Exudate Eyes: Conjunctiva clear, sclera non-icteric  Neck: trachea midline.  No JVD.  Pulmonary:  Good air movement, respirations not labored, no use of accessory muscles Cardiac: RRR, normal S1, S2 Vascular:  Vessel Right Left  Radial Palpable Palpable  Ulnar Palpable Palpable  Brachial Palpable Palpable  Carotid Palpable, without bruit Palpable, without bruit  Aorta Not palpable N/A  Femoral Palpable Palpable  Popliteal Palpable Palpable  PT Palpable Palpable  DP Palpable Palpable   Gastrointestinal: soft, non-tender/non-distended. No guarding/reflex.  Musculoskeletal: M/S 5/5 throughout.  Extremities without ischemic changes.  No deformity or atrophy.  Neurologic: Sensation grossly intact in extremities.  Symmetrical.  Speech is fluent. Motor exam as listed above. Psychiatric: Judgment intact, Mood & affect appropriate for pt's clinical situation. Dermatologic: No rashes or ulcers noted.  No cellulitis or open wounds. Lymph : No Cervical, Axillary, or Inguinal lymphadenopathy.   Radiology Ct Abdomen Pelvis W Contrast  Result  Date: 07/03/2016 CLINICAL DATA:  Pt c/o after eating she has severe pain in her mid epigastric radiating down to her pelvis in the midline for approximately 1 month. NKI. Hx Right Breast CA in 2011 with mastectomy + rad tx's. Hx Total hysterectomy and Back surg. ^87mL ISOVUE-300 IOPAMIDOL (ISOVUE-300) INJECTION 61% EXAM: CT ABDOMEN AND PELVIS WITH CONTRAST TECHNIQUE: Multidetector CT imaging of the abdomen and pelvis was performed using the standard protocol following bolus administration of intravenous contrast. CONTRAST:  49mL ISOVUE-300 IOPAMIDOL (ISOVUE-300) INJECTION 61% COMPARISON:  CT pelvis dated 06/10/2004.  Chest CT dated 02/14/2016. FINDINGS: Lower chest: Chronic scarring/fibrosis at each lung base. Densely calcified mitral annulus, partially imaged. No acute findings. Hepatobiliary: Stable small cyst within the lower right liver lobe. Liver is low in density suggesting fatty infiltration. Gallbladder appears normal. CBD upper normal in caliber for age. No bile duct stone seen. Pancreas: No acute findings. No evidence of pancreatitis. Stable mild pancreatic duct prominence. Spleen: Normal in size without focal abnormality. Adrenals/Urinary Tract: Adrenal glands appear normal. Chronic appearing partially calcified mass along the lateral margin of the right renal cortex, measuring 4.7 x 2.3 cm, with associated mass-effect, compatible with old subcapsular hematoma. Kidneys otherwise unremarkable without suspicious mass, stone or hydronephrosis. No perinephric inflammation. No ureteral or bladder calculi. Bladder is decompressed. Stomach/Bowel: Bowel is normal in caliber. No bowel wall thickening or evidence of bowel wall inflammation. Stomach appears normal. Appendix is not seen but there are no inflammatory changes about the cecum to suggest acute appendicitis. Vascular/Lymphatic: Aortic atherosclerosis. No enlarged abdominal or pelvic lymph nodes. Reproductive: Presumed hysterectomy.  No adnexal masses.  Other: No free fluid or abscess collection. No free intraperitoneal air. Musculoskeletal: Degenerative changes of the slightly scoliotic thoracolumbar spine. No acute or suspicious osseous finding. Surgical changes of central canal decompression and posterior fusion in the lumbar spine. Small periumbilical abdominal wall hernia which contains fat only. Superficial soft tissues otherwise unremarkable. IMPRESSION: 1. No acute findings. No bowel obstruction or evidence of bowel wall inflammation. No free fluid. No evidence of cholecystitis or pancreatitis. No renal or ureteral calculi. 2. Aortic atherosclerosis.  3. Additional chronic/incidental findings detailed above. Electronically Signed   By: Franki Cabot M.D.   On: 07/03/2016 09:50    Labs Recent Results (from the past 2160 hour(s))  POCT HgB A1C     Status: None   Collection Time: 06/09/16 11:51 AM  Result Value Ref Range   Hemoglobin A1C 7.3     Assessment/Plan:  Type II diabetes mellitus, uncontrolled blood glucose control important in reducing the progression of atherosclerotic disease. Also, involved in wound healing. On appropriate medications.   Essential hypertension, benign blood pressure control important in reducing the progression of atherosclerotic disease. On appropriate oral medications.   Hyperlipidemia lipid control important in reducing the progression of atherosclerotic disease. Continue statin therapy   Abdominal pain Although not entirely clear at its etiology, certainly chronic visceral ischemia has to be considered given her clinical scenario of food fear, unintentional weight loss, and abdominal pain which is postprandial in nature. Her CT scan is consistent with that being a possibility as well.  Chronic mesenteric ischemia (HCC) Although her symptoms are not entirely clear in their etiology, certainly chronic visceral ischemia has to be considered given her clinical scenario of food fear, unintentional weight  loss, and abdominal pain which is postprandial in nature. CT scan which I have independently reviewed. This scan was performed with contrast although it was reasonably low quality for the arterial phase. Interestingly, the radiologist only commented that there was aortic atherosclerosis. It took the gastroenterologist reviewing the scan to recognize that potential mesenteric artery disease was present. In my review of the scan the origin of the SMA is not well seen but is highly calcific. The celiac artery appears to have calcific areas at the origin although it does not appear to be highly stenotic. The IMA origin is also very calcific and possibly stenotic. None of this was reported by the radiologist. At this point, given the serious nature of the situation I think it would be reasonable to proceed with mesenteric angiogram with possible revascularization. Risks and benefits were discussed with the patient and she is agreeable to proceed.      Leotis Pain 07/28/2016, 2:55 PM   This note was created with Dragon medical transcription system.  Any errors from dictation are unintentional.

## 2016-07-28 NOTE — Assessment & Plan Note (Signed)
blood glucose control important in reducing the progression of atherosclerotic disease. Also, involved in wound healing. On appropriate medications.  

## 2016-07-29 ENCOUNTER — Ambulatory Visit: Payer: Medicare Other

## 2016-07-30 ENCOUNTER — Telehealth (INDEPENDENT_AMBULATORY_CARE_PROVIDER_SITE_OTHER): Payer: Self-pay

## 2016-07-30 ENCOUNTER — Other Ambulatory Visit (INDEPENDENT_AMBULATORY_CARE_PROVIDER_SITE_OTHER): Payer: Self-pay | Admitting: Vascular Surgery

## 2016-07-30 NOTE — Telephone Encounter (Signed)
Spoke with patient and per Hezzie Bump Tramadol has been called into patient's pharmacy. See note below.

## 2016-07-30 NOTE — Telephone Encounter (Signed)
I spoke with Dr. Lucky Cowboy. It is ok to give about 15 tramadol to help with the pain. If the tramadol does not help or she finishes it before her mesenteric angiogram on 08/06/16 she should seek medical attention in the ED and have her abdomen assessed.   Tramadol 50mg   One to Two Tabs by Mouth  Every Six Hours As Needed For Pain

## 2016-07-30 NOTE — Telephone Encounter (Signed)
Patient wants pain medication because of her pain level.

## 2016-08-03 ENCOUNTER — Encounter (INDEPENDENT_AMBULATORY_CARE_PROVIDER_SITE_OTHER): Payer: Self-pay | Admitting: Vascular Surgery

## 2016-08-05 ENCOUNTER — Encounter
Admission: RE | Admit: 2016-08-05 | Discharge: 2016-08-05 | Disposition: A | Discharge status: Home / Self Care | Payer: Medicare Other | Source: Ambulatory Visit | Attending: Vascular Surgery | Admitting: Vascular Surgery

## 2016-08-05 DIAGNOSIS — Z853 Personal history of malignant neoplasm of breast: Secondary | ICD-10-CM | POA: Diagnosis not present

## 2016-08-05 DIAGNOSIS — Z803 Family history of malignant neoplasm of breast: Secondary | ICD-10-CM | POA: Diagnosis not present

## 2016-08-05 DIAGNOSIS — Z809 Family history of malignant neoplasm, unspecified: Secondary | ICD-10-CM | POA: Diagnosis not present

## 2016-08-05 DIAGNOSIS — Z87891 Personal history of nicotine dependence: Secondary | ICD-10-CM | POA: Diagnosis not present

## 2016-08-05 DIAGNOSIS — Z8249 Family history of ischemic heart disease and other diseases of the circulatory system: Secondary | ICD-10-CM | POA: Diagnosis not present

## 2016-08-05 DIAGNOSIS — M1990 Unspecified osteoarthritis, unspecified site: Secondary | ICD-10-CM | POA: Diagnosis not present

## 2016-08-05 DIAGNOSIS — Z885 Allergy status to narcotic agent status: Secondary | ICD-10-CM | POA: Diagnosis not present

## 2016-08-05 DIAGNOSIS — K551 Chronic vascular disorders of intestine: Secondary | ICD-10-CM | POA: Diagnosis not present

## 2016-08-05 DIAGNOSIS — R51 Headache: Secondary | ICD-10-CM | POA: Diagnosis not present

## 2016-08-05 DIAGNOSIS — Z888 Allergy status to other drugs, medicaments and biological substances status: Secondary | ICD-10-CM | POA: Diagnosis not present

## 2016-08-05 DIAGNOSIS — Z7982 Long term (current) use of aspirin: Secondary | ICD-10-CM | POA: Diagnosis not present

## 2016-08-05 DIAGNOSIS — Z794 Long term (current) use of insulin: Secondary | ICD-10-CM | POA: Diagnosis not present

## 2016-08-05 DIAGNOSIS — D649 Anemia, unspecified: Secondary | ICD-10-CM | POA: Diagnosis not present

## 2016-08-05 DIAGNOSIS — Z8261 Family history of arthritis: Secondary | ICD-10-CM | POA: Diagnosis not present

## 2016-08-05 DIAGNOSIS — Z9011 Acquired absence of right breast and nipple: Secondary | ICD-10-CM | POA: Diagnosis not present

## 2016-08-05 DIAGNOSIS — Z96659 Presence of unspecified artificial knee joint: Secondary | ICD-10-CM | POA: Diagnosis not present

## 2016-08-05 DIAGNOSIS — E119 Type 2 diabetes mellitus without complications: Secondary | ICD-10-CM | POA: Diagnosis not present

## 2016-08-05 DIAGNOSIS — E785 Hyperlipidemia, unspecified: Secondary | ICD-10-CM | POA: Diagnosis not present

## 2016-08-05 DIAGNOSIS — I10 Essential (primary) hypertension: Secondary | ICD-10-CM | POA: Diagnosis not present

## 2016-08-05 DIAGNOSIS — G709 Myoneural disorder, unspecified: Secondary | ICD-10-CM | POA: Diagnosis not present

## 2016-08-05 DIAGNOSIS — Z882 Allergy status to sulfonamides status: Secondary | ICD-10-CM | POA: Diagnosis not present

## 2016-08-05 HISTORY — DX: Other nonspecific abnormal finding of lung field: R91.8

## 2016-08-05 LAB — CREATININE, SERUM
Creatinine, Ser: 1.11 mg/dL — ABNORMAL HIGH (ref 0.44–1.00)
GFR calc Af Amer: 52 mL/min — ABNORMAL LOW (ref >60)
GFR calc non Af Amer: 45 mL/min — ABNORMAL LOW (ref >60)

## 2016-08-05 LAB — BUN: BUN: 21 mg/dL — ABNORMAL HIGH (ref 6–20)

## 2016-08-05 NOTE — OR Nursing (Signed)
Notified ebony at [facility] that patient's GFR is 45.  sHe is scheduled for a mesenteric angiogram tomorrow and orders did not contain renal protection.  Requested lab results be reviewed by ordering physician and any orders for renal protection be placed.

## 2016-08-06 ENCOUNTER — Encounter
Admission: RE | Disposition: A | Discharge status: Home / Self Care | Payer: Self-pay | Source: Ambulatory Visit | Attending: Vascular Surgery

## 2016-08-06 ENCOUNTER — Ambulatory Visit
Admission: RE | Admit: 2016-08-06 | Discharge: 2016-08-06 | Disposition: A | Discharge status: Home / Self Care | Payer: Medicare Other | Source: Ambulatory Visit | Attending: Vascular Surgery | Admitting: Vascular Surgery

## 2016-08-06 DIAGNOSIS — Z87891 Personal history of nicotine dependence: Secondary | ICD-10-CM | POA: Insufficient documentation

## 2016-08-06 DIAGNOSIS — Z96659 Presence of unspecified artificial knee joint: Secondary | ICD-10-CM | POA: Insufficient documentation

## 2016-08-06 DIAGNOSIS — Z7982 Long term (current) use of aspirin: Secondary | ICD-10-CM | POA: Insufficient documentation

## 2016-08-06 DIAGNOSIS — I10 Essential (primary) hypertension: Secondary | ICD-10-CM | POA: Insufficient documentation

## 2016-08-06 DIAGNOSIS — Z8249 Family history of ischemic heart disease and other diseases of the circulatory system: Secondary | ICD-10-CM | POA: Insufficient documentation

## 2016-08-06 DIAGNOSIS — M1990 Unspecified osteoarthritis, unspecified site: Secondary | ICD-10-CM | POA: Insufficient documentation

## 2016-08-06 DIAGNOSIS — R51 Headache: Secondary | ICD-10-CM | POA: Insufficient documentation

## 2016-08-06 DIAGNOSIS — Z803 Family history of malignant neoplasm of breast: Secondary | ICD-10-CM | POA: Insufficient documentation

## 2016-08-06 DIAGNOSIS — Z888 Allergy status to other drugs, medicaments and biological substances status: Secondary | ICD-10-CM | POA: Insufficient documentation

## 2016-08-06 DIAGNOSIS — E119 Type 2 diabetes mellitus without complications: Secondary | ICD-10-CM | POA: Insufficient documentation

## 2016-08-06 DIAGNOSIS — Z885 Allergy status to narcotic agent status: Secondary | ICD-10-CM | POA: Insufficient documentation

## 2016-08-06 DIAGNOSIS — Z9011 Acquired absence of right breast and nipple: Secondary | ICD-10-CM | POA: Insufficient documentation

## 2016-08-06 DIAGNOSIS — Z853 Personal history of malignant neoplasm of breast: Secondary | ICD-10-CM | POA: Insufficient documentation

## 2016-08-06 DIAGNOSIS — K551 Chronic vascular disorders of intestine: Secondary | ICD-10-CM | POA: Diagnosis not present

## 2016-08-06 DIAGNOSIS — Z794 Long term (current) use of insulin: Secondary | ICD-10-CM | POA: Insufficient documentation

## 2016-08-06 DIAGNOSIS — Z882 Allergy status to sulfonamides status: Secondary | ICD-10-CM | POA: Insufficient documentation

## 2016-08-06 DIAGNOSIS — Z8261 Family history of arthritis: Secondary | ICD-10-CM | POA: Insufficient documentation

## 2016-08-06 DIAGNOSIS — I70212 Atherosclerosis of native arteries of extremities with intermittent claudication, left leg: Secondary | ICD-10-CM

## 2016-08-06 DIAGNOSIS — G709 Myoneural disorder, unspecified: Secondary | ICD-10-CM | POA: Insufficient documentation

## 2016-08-06 DIAGNOSIS — D649 Anemia, unspecified: Secondary | ICD-10-CM | POA: Insufficient documentation

## 2016-08-06 DIAGNOSIS — Z809 Family history of malignant neoplasm, unspecified: Secondary | ICD-10-CM | POA: Insufficient documentation

## 2016-08-06 DIAGNOSIS — E785 Hyperlipidemia, unspecified: Secondary | ICD-10-CM | POA: Insufficient documentation

## 2016-08-06 HISTORY — PX: VISCERAL ANGIOGRAPHY: CATH118276

## 2016-08-06 HISTORY — PX: VISCERAL ARTERY INTERVENTION: CATH118277

## 2016-08-06 LAB — GLUCOSE, CAPILLARY: Glucose-Capillary: 233 mg/dL — ABNORMAL HIGH (ref 65–99)

## 2016-08-06 SURGERY — VISCERAL ANGIOGRAPHY
Anesthesia: Moderate Sedation

## 2016-08-06 MED ORDER — HEPARIN (PORCINE) IN NACL 2-0.9 UNIT/ML-% IJ SOLN
INTRAMUSCULAR | Status: AC
  Filled 2016-08-06: qty 1000

## 2016-08-06 MED ORDER — HYDROMORPHONE HCL 1 MG/ML IJ SOLN: 1.0000 mg | Freq: Once | INTRAMUSCULAR | Status: DC

## 2016-08-06 MED ORDER — ONDANSETRON HCL 4 MG/2ML IJ SOLN: 4.0000 mg | Freq: Four times a day (QID) | INTRAMUSCULAR | Status: DC | PRN

## 2016-08-06 MED ORDER — FAMOTIDINE 20 MG PO TABS
40.0000 mg | ORAL_TABLET | ORAL | Status: DC | PRN
Start: 2016-08-06 — End: 2016-08-06

## 2016-08-06 MED ORDER — MIDAZOLAM HCL 2 MG/2ML IJ SOLN
INTRAMUSCULAR | Status: DC | PRN
  Administered 2016-08-06 (×5): 1 mg via INTRAVENOUS

## 2016-08-06 MED ORDER — LIDOCAINE-EPINEPHRINE (PF) 2 %-1:200000 IJ SOLN
INTRAMUSCULAR | Status: AC
Start: 2016-08-06 — End: 2016-08-06
  Filled 2016-08-06: qty 20

## 2016-08-06 MED ORDER — FENTANYL CITRATE (PF) 100 MCG/2ML IJ SOLN
INTRAMUSCULAR | Status: AC
  Filled 2016-08-06: qty 2

## 2016-08-06 MED ORDER — SODIUM CHLORIDE 0.9 % IV SOLN
Freq: Once | INTRAVENOUS | Status: AC
  Administered 2016-08-06: 250 mL via INTRAVENOUS

## 2016-08-06 MED ORDER — MIDAZOLAM HCL 5 MG/5ML IJ SOLN
INTRAMUSCULAR | Status: AC
  Filled 2016-08-06: qty 5

## 2016-08-06 MED ORDER — SODIUM CHLORIDE 0.9 % IV SOLN: 500.0000 mL | Freq: Once | INTRAVENOUS | Status: DC | PRN

## 2016-08-06 MED ORDER — SODIUM CHLORIDE 0.9 % IV SOLN
Freq: Once | INTRAVENOUS | Status: AC
  Administered 2016-08-06: 09:00:00 via INTRAVENOUS

## 2016-08-06 MED ORDER — HYDROMORPHONE HCL 1 MG/ML IJ SOLN: 0.5000 mg | INTRAMUSCULAR | Status: DC | PRN

## 2016-08-06 MED ORDER — HEPARIN SODIUM (PORCINE) 1000 UNIT/ML IJ SOLN
INTRAMUSCULAR | Status: DC | PRN
  Administered 2016-08-06: 4000 [IU] via INTRAVENOUS

## 2016-08-06 MED ORDER — GUAIFENESIN-DM 100-10 MG/5ML PO SYRP
15.0000 mL | ORAL_SOLUTION | ORAL | Status: DC | PRN
  Filled 2016-08-06: qty 15

## 2016-08-06 MED ORDER — ACETAMINOPHEN 325 MG RE SUPP
325.0000 mg | RECTAL | Status: DC | PRN
  Filled 2016-08-06: qty 2

## 2016-08-06 MED ORDER — ACETAMINOPHEN 325 MG PO TABS: 325.0000 mg | ORAL_TABLET | ORAL | Status: DC | PRN

## 2016-08-06 MED ORDER — HYDRALAZINE HCL 20 MG/ML IJ SOLN: 5.0000 mg | INTRAMUSCULAR | Status: DC | PRN

## 2016-08-06 MED ORDER — PHENOL 1.4 % MT LIQD
1.0000 | OROMUCOSAL | Status: DC | PRN
  Filled 2016-08-06: qty 177

## 2016-08-06 MED ORDER — HEPARIN SODIUM (PORCINE) 1000 UNIT/ML IJ SOLN
INTRAMUSCULAR | Status: AC
  Filled 2016-08-06: qty 1

## 2016-08-06 MED ORDER — METOPROLOL TARTRATE 5 MG/5ML IV SOLN: 2.0000 mg | INTRAVENOUS | Status: DC | PRN

## 2016-08-06 MED ORDER — FENTANYL CITRATE (PF) 100 MCG/2ML IJ SOLN
INTRAMUSCULAR | Status: DC | PRN
  Administered 2016-08-06 (×4): 50 ug via INTRAVENOUS

## 2016-08-06 MED ORDER — SODIUM CHLORIDE 0.9 % IV SOLN
INTRAVENOUS | Status: DC
  Administered 2016-08-06: 09:00:00 via INTRAVENOUS

## 2016-08-06 MED ORDER — METHYLPREDNISOLONE SODIUM SUCC 125 MG IJ SOLR: 125.0000 mg | INTRAMUSCULAR | Status: DC | PRN

## 2016-08-06 MED ORDER — CEFAZOLIN IN D5W 1 GM/50ML IV SOLN
1.0000 g | Freq: Once | INTRAVENOUS | Status: AC
  Administered 2016-08-06: 1 g via INTRAVENOUS

## 2016-08-06 MED ORDER — OXYCODONE-ACETAMINOPHEN 5-325 MG PO TABS: 1.0000 | ORAL_TABLET | ORAL | Status: DC | PRN

## 2016-08-06 MED ORDER — LABETALOL HCL 5 MG/ML IV SOLN: 10.0000 mg | INTRAVENOUS | Status: DC | PRN

## 2016-08-06 SURGICAL SUPPLY — 24 items
BALLN LUTONIX DCB 7X60X130 (BALLOONS) ×3
BALLN ULTRV 018 3X40X75 (BALLOONS) ×3
BALLN ULTRVRSE 5X20X75C (BALLOONS) ×3
BALLOON LUTONIX DCB 7X60X130 (BALLOONS) IMPLANT
BALLOON ULTRV 018 3X40X75 (BALLOONS) IMPLANT
BALLOON ULTRVRSE 5X20X75C (BALLOONS) IMPLANT
CATH 5FR REUT (CATHETERS) ×2 IMPLANT
CATH ANGIO 5F 80CM MHK 2 (CATHETERS) ×2 IMPLANT
CATH BEACON 5.038 65CM KMP-01 (CATHETERS) ×2 IMPLANT
CATH PIG 70CM (CATHETERS) ×2 IMPLANT
DEVICE PRESTO INFLATION (MISCELLANEOUS) ×2 IMPLANT
DEVICE STARCLOSE SE CLOSURE (Vascular Products) ×4 IMPLANT
GLIDECATH 4FR STR (CATHETERS) ×2 IMPLANT
GLIDEWIRE ADV .035X260CM (WIRE) ×2 IMPLANT
PACK ANGIOGRAPHY (CUSTOM PROCEDURE TRAY) ×3 IMPLANT
SHEATH BRITE TIP 5FRX11 (SHEATH) ×5 IMPLANT
SHEATH PINNACLE ST 6F 45CM (SHEATH) ×2 IMPLANT
SHIELD X-DRAPE GOLD 12X17 (MISCELLANEOUS) ×2 IMPLANT
STENT LIFESTREAM 6X26X80 (Permanent Stent) ×2 IMPLANT
SYR MEDRAD MARK V 150ML (SYRINGE) ×3 IMPLANT
TUBING CONTRAST HIGH PRESS 72 (TUBING) ×3 IMPLANT
WIRE G 018X200 V18 (WIRE) ×2 IMPLANT
WIRE J 3MM .035X145CM (WIRE) ×3 IMPLANT
WIRE MAGIC TORQUE 260C (WIRE) ×2 IMPLANT

## 2016-08-06 NOTE — Progress Notes (Signed)
Notified [facility]IR lab of GFR and need for attestation.

## 2016-08-06 NOTE — H&P (Signed)
[facility] History & Physical Update  The patient was interviewed and re-examined.  The patient's previous History and Physical has been reviewed and is unchanged.  There is no change in the plan of care. We plan to proceed with the scheduled procedure.  Leotis Pain, MD  08/06/2016, 9:06 AM

## 2016-08-06 NOTE — Op Note (Signed)
[facility] Percutaneous Study/Intervention Procedural Note   Date: 08/06/2016  Surgeon(s): Leotis Pain, MD  Assistants: none  Pre-operative Diagnosis: 1.  Chronic Mesenteric ischemia 2. Possible mesenteric stenosis on previous CT scan (not CT angiogram so the study was limited)   Post-operative diagnosis: Same  Procedure(s) Performed: 1. Ultrasound guidance for vascular access right femoral artery and left femoral artery 2. Catheter placement into superior mesenteric artery from left femoral approach 3. Aortogram and selective angiogram of the superior mesenteric artery  4.  Percutaneous transluminal angioplasty of the left common iliac artery with 7 mm diameter by 6 cm length Lutonix drug-coated angioplasty balloon 5. Stent to the superior mesenteric artery with 6 mm diameter x 26 mm length balloon expandable Lifestream stent after predilatation with 35 mm diameter balloons 6. StarClose closure device bilateral femoral artery  Contrast: 75  Fluoro time: 13.1  EBL: 10 cc  Anesthesia: Approximately 55 minutes of Moderate conscious sedation using 5 mg of Versed and 200 mcg of Fentanyl  Indications: Patient is a 83 y.o. female who has symptoms consistent with mesenteric ischemia. The patient has a CT scan showing a calcific aorta but this was not a CT and could not really determine any specific mesenteric artery stenosis. The patient is brought in for angiography for further evaluation and potential treatment. Risks and benefits are discussed and informed consent is obtained  Procedure: The patient was identified and appropriate procedural time out was performed. The patient was then placed supine on the table and prepped and draped in the usual sterile fashion.Moderate conscious sedation was administered during a face to face encounter with the patient throughout the procedure  with my supervision of the RN administering medicines and monitoring the patient's vital signs, pulse oximetry, telemetry and mental status throughout from the start of the procedure until the patient was taken to the recovery room. Ultrasound was used to evaluate the right common femoral artery. It was patent . A digital ultrasound image was acquired. A Seldinger needle was used to access the right common femoral artery under direct ultrasound guidance and a permanent image was performed. A 0.035 J wire was advanced and a 5Fr sheath was placed. There was resistance met with attempts to place the pigtail catheter into the aorta from the right.  Imaging demonstrated a near occlusive extremely calcific lesion in the right common iliac artery. After spending several minutes trying to cross this lesion, I elected turned my attention to the left femoral artery. The left femoral artery was visualized with ultrasound and found to be patent. It was accessed under direct ultrasound guidance and a permanent image was recorded. There was still resistance crossing the left common iliac artery. An advantage wire and Kumpe catheter were required and I went ahead and gave the patient 4000 units of intravenous heparin. I was able to cross this lesion and confirm intraluminal flow in the aorta. Imaging demonstrated what appeared to be a greater than 70% highly calcific stenosis in the left common iliac artery. I then selected a 7 mm diameter by 6 cm length Lutonix drug-coated angioplasty balloon and used this to treat the left common iliac artery stenosis. We would not of been able to advance sheaths were treat her mesenteric vessels without treating this iliac lesion first. This was inflated to 12 atm for 1 minute. Completion angiogram showed only about a 15-20% residual stenosis, I was now able to pass catheters without difficulty. Pigtail catheter was placed into the aorta and an AP  aortogram was performed. We transitioned  to the lateral projection to image the celiac and SMA. These images demonstrated a near occlusive stenosis of the celiac artery and superior mesenteric artery which were highly calcific. Mild stenosis of the IMA which appeared to be less than 50% with a large IMA creating a meandering mesenteric artery for collaterals secondary to SMA stenosis. Aorta was highly calcific but not stenotic. Renal arteries appear to be mildly stenotic on the right and no significant stenosis on the left. The iliac disease was described above.We upsized to a 6 Fr sheath.  A VS 1 catheter was used to selectively cannulate the superior mesenteric artery. This demonstrated a greater than 90% stenosis of the superior mesenteric artery. Based on her symptoms and these findings, I elected to treat the superior mesenteric artery to try to improve the patient's clinical course. I crossed the lesion without difficulty with an advantage wire and the VS 1 catheter. Due to the dense calcific nature of the lesion, I elected to predilate this and used a 3 mm diameter by 2 cm length balloon and a 5 mm diameter by 2 cm length balloon. I then used a 6 mm diameter x 26 mm length balloon expandable Lifestream stent to perform treatment of the proximal superior mesenteric artery. I inflated the balloon to 12 atm. The stent was deployed 3-4 cm back into the aorta to make sure that the proximal portion of the lesion was treated. On completion angiogram following this, <10% residual stenosis was identified. At this point, I elected to terminate the procedure. The diagnostic catheter was removed. StarClose closure device was deployed in usual fashion with excellent hemostatic result in both femoral arteries. The patient was taken to the recovery room in stable condition having tolerated the procedure well.     Findings:Near occlusive stenosis of the celiac artery and superior mesenteric artery which were highly calcific. Mild stenosis of the IMA which  appeared to be less than 50% with a large IMA creating a meandering mesenteric artery for collaterals secondary to SMA stenosis. Iliac arteries were extremely calcific and nearly occluded on the right and highly stenotic on the left with a greater than 70% stenosis. Aorta was highly calcific but not stenotic. Renal arteries appear to be mildly stenotic on the right and no significant stenosis on the left.  Disposition: Patient was taken to the recovery room in stable condition having tolerated the procedure well.  Complications: None  Leotis Pain 08/06/2016 10:54 AM   This note was created with Dragon Medical transcription system. Any errors in dictation are purely unintentional.

## 2016-08-07 ENCOUNTER — Encounter: Payer: Self-pay | Admitting: Vascular Surgery

## 2016-08-16 NOTE — Progress Notes (Signed)
[place]  [phone] [phone]  ID: Andrea Wallace OB: Jun 29, 1931  MR#: [id number]  UMP#:[id number]  Patient Care Team: Kirk Ruths, MD as PCP - General (Unknown Physician Specialty)  CHIEF COMPLAINT: Left lower lobe lung mass.  INTERVAL HISTORY: Patient returns to clinic today for further evaluation and discussion of her CT scan results. She continues to be anxious, but otherwise feels well. She denies any chest pain, cough, hemoptysis, or shortness of breath. She has no neurologic complaints. She denies any recent fevers or illnesses. She has a good appetite and denies any weight loss. She has no nausea, vomiting, constipation, or diarrhea. She has no urinary complaints. Patient offers no specific complaints today.  REVIEW OF SYSTEMS:   Review of Systems  Constitutional: Negative for fever, malaise/fatigue and weight loss.  Respiratory: Negative for cough, hemoptysis, sputum production and shortness of breath.   Cardiovascular: Negative.  Negative for chest pain and leg swelling.  Gastrointestinal: Negative.  Negative for abdominal pain.  Musculoskeletal: Negative.   Neurological: Negative.  Negative for weakness.  Psychiatric/Behavioral: The patient is nervous/anxious.     As per HPI. Otherwise, a complete review of systems is negative.  PAST MEDICAL HISTORY: Past Medical History:  Diagnosis Date  . Allergy   . Anemia   . Arthritis   . Asthma   . Blood transfusion   . Breast cancer [facility]) 2011   Right  . Cancer ([place])    right breast  . Complication of anesthesia    diff  waking up  . Cough   . Diabetes mellitus   . Generalized headaches   . Hyperlipidemia   . Lung nodules   . Nasal congestion   . Neuromuscular disorder ([place])     PAST SURGICAL HISTORY: Past Surgical History:  Procedure Laterality Date  . BACK SURGERY    . BREAST SURGERY  06/2007   right mastectomy  . BREAST SURGERY  2011   left  . FOOT  SURGERY    . KIDNEY STONE SURGERY    . MASTECTOMY Right 2011   with Radiaiton  . REDUCTION MAMMAPLASTY Left 2012  . SHOULDER ARTHROSCOPY     bilateral  . TOTAL KNEE ARTHROPLASTY    . VISCERAL ANGIOGRAPHY N/A 08/06/2016   Procedure: Visceral Angiography;  Surgeon: Algernon Huxley, MD;  Location: [place] [facility];  Service: Cardiovascular;  Laterality: N/A;  . VISCERAL ARTERY INTERVENTION N/A 08/06/2016   Procedure: Visceral Artery Intervention;  Surgeon: Algernon Huxley, MD;  Location: [place] [facility];  Service: Cardiovascular;  Laterality: N/A;    FAMILY HISTORY Family History  Problem Relation Age of Onset  . Osteoporosis Mother   . Heart disease Father   . Cancer Brother   . Breast cancer Brother   . Heart disease Sister        ADVANCED DIRECTIVES:    HEALTH MAINTENANCE: Social History  Substance Use Topics  . Smoking status: Former Smoker    Quit date: 03/15/1984  . Smokeless tobacco: Never Used  . Alcohol use Yes     Comment: occ     Colonoscopy:  PAP:  Bone density:  Lipid panel:  Allergies  Allergen Reactions  . Codeine Sulfate Itching    Current Outpatient Prescriptions  Medication Sig Dispense Refill  . acetaminophen (TYLENOL) 500 MG tablet Take 500 mg by mouth daily.     Marland Kitchen aspirin 81 MG tablet Take 81 mg by mouth at bedtime.     Marland Kitchen  Cholecalciferol (VITAMIN D PO) Take 1 tablet by mouth daily. Reported on 09/24/2015    . Continuous Blood Gluc Sensor (FREESTYLE LIBRE SENSOR SYSTEM) MISC 1 Device by Does not apply route every 14 (fourteen) days. [id number] each 3  . CRESTOR 40 MG tablet Take 40 mg by mouth at bedtime.     . Cyanocobalamin (VITAMIN B-12 PO) Take 1 tablet by mouth daily.    . DULoxetine (CYMBALTA) 60 MG capsule Take 1 capsule (60 mg total) by mouth daily. (Patient taking differently: Take 60 mg by mouth at bedtime. ) 30 capsule 2  . gabapentin (NEURONTIN) 600 MG tablet TAKE ONE TABLET BY MOUTH THREE TIMES DAILY 90 tablet 5  .  HYDROcodone-acetaminophen (NORCO) 10-325 MG tablet Take 1 tablet by mouth 2 (two) times daily as needed for moderate pain. 60 tablet 0  . hydroxypropyl methylcellulose / hypromellose (ISOPTO TEARS / GONIOVISC) 2.5 % ophthalmic solution Place 1 drop into both eyes 3 (three) times daily as needed for dry eyes.    . insulin NPH Human (NOVOLIN N RELION) 100 UNIT/ML injection Inject 40 units daily 10 mL 3  . insulin regular (NOVOLIN R RELION) 250 units/2.75mL (100 units/mL) injection Use 25 units in the morning and 35 units at supper 20 mL 3  . montelukast (SINGULAIR) 10 MG tablet Take 10 mg by mouth at bedtime.     . propranolol (INDERAL) 40 MG tablet Take 40 mg by mouth at bedtime.    . traMADol (ULTRAM) 50 MG tablet Take 1 tablet (50 mg total) by mouth every 12 (twelve) hours as needed. (Patient taking differently: Take 50 mg by mouth every 12 (twelve) hours as needed for moderate pain. ) 60 tablet 0   No current facility-administered medications for this visit.     OBJECTIVE: Vitals:   08/18/16 1133  BP: 140/79  Pulse: (!) 122  Resp: 18  Temp: 98.3 F (36.8 C)     Body mass index is 26.36 kg/m.    ECOG FS:1 - Symptomatic but completely ambulatory  General: Well-developed, well-nourished, no acute distress. Eyes: Pink conjunctiva, anicteric sclera. Lungs: Scattered wheezing throughout. Heart: Regular rate and rhythm. No rubs, murmurs, or gallops. Abdomen: Soft, nontender, nondistended. No organomegaly noted, normoactive bowel sounds. Musculoskeletal: No edema, cyanosis, or clubbing. Neuro: Alert, answering all questions appropriately. Cranial nerves grossly intact. Skin: No rashes or petechiae noted. Psych: Normal affect.  LAB RESULTS:  Lab Results  Component Value Date   NA 140 12/24/2015   K 4.5 12/24/2015   CL 103 12/24/2015   CO2 29 12/24/2015   GLUCOSE 214 (H) 12/24/2015   BUN 21 (H) 08/05/2016   CREATININE 1.11 (H) 08/05/2016   CALCIUM 9.6 12/24/2015   PROT 7.2  12/24/2015   ALBUMIN 3.9 12/24/2015   AST 17 12/24/2015   ALT 13 12/24/2015   ALKPHOS 76 12/24/2015   BILITOT 0.4 12/24/2015   GFRNONAA 45 (L) 08/05/2016   GFRAA 52 (L) 08/05/2016    Lab Results  Component Value Date   WBC 9.5 06/29/2012   NEUTROABS 6.8 06/21/2012   HGB 9.4 (L) 06/29/2012   HCT 28.5 (L) 06/29/2012   MCV 88.8 06/29/2012   PLT 141 (L) 06/29/2012   Lab Results  Component Value Date   LABCA2 26.0 12/02/2015     STUDIES: Ct Chest W Contrast  Result Date: 08/17/2016 CLINICAL DATA:  Follow-up lung nodules EXAM: CT CHEST WITH CONTRAST TECHNIQUE: Multidetector CT imaging of the chest was performed during intravenous contrast administration. CONTRAST:  55mL ISOVUE-300 IOPAMIDOL (ISOVUE-300) INJECTION 61% COMPARISON:  CT 02/14/2016 FINDINGS: Cardiovascular: Coronary artery calcification and aortic atherosclerotic calcification. Mediastinum/Nodes: Prominent mediastinal and RIGHT hilar lymph nodes are unchanged. Example prevascular lymph node measuring 14 mm unchanged from 12 mm. RIGHT hilar lymph node measuring 12 mm not changed from 12 mm. No new adenopathy. No axillary or supraclavicular adenopathy. Lungs/Pleura: LEFT lower lobe nodule along the LEFT heart border measuring 10 mm (image 96, series 3) compared to 10 mm on prior. More inferior nodule has since resolved. No LEFT upper lobe nodule. Subpleural nodule along the horizontal fissure measuring 4 mm (image 76, series 3) compares to 5 mm. There is interlobular septal thickening at the lung bases similar to prior. No bronchiectasis Upper Abdomen: Limited view of the liver, kidneys, pancreas are unremarkable. Normal adrenal glands. Musculoskeletal: No aggressive osseous lesion. Postoperative change in the upper lumbar spine again noted IMPRESSION: 1. Dominant solid nodule in the LEFT lower lobe is not changed. 2. Resolution of secondary nodule in the LEFT lower lobe. 3. Interlobular septal thickening and mild mediastinal  adenopathy not changed. Electronically Signed   By: Suzy Bouchard M.D.   On: 08/17/2016 09:47    ASSESSMENT: Left lower lobe lung mass, history of breast cancer.  PLAN:    1. Left lower lobe lung mass: CT results from August 17, 2016 reviewed independently and reported as above with left lower lobe nodule essentially unchanged. He secondary nodule in the left lower lobe is resolved. Given the appearance on CT scan, this may be more consistent with a carcinoid rather than a bronchogenic carcinoma. Given patient's extensive comorbidities, she is not anxious to proceed with percutaneous biopsy. She also does not wish to undergo surgery at this time. Will continue to monitor closely and repeat CT scan in 6 months to assess for interval change. If nodule remains stable at that time, patient could possibly be discharged from clinic with continued follow-up with primary care.  2. History of breast cancer: Continue mammograms as ordered. CA-27-29 is within normal limits.  3. Cough: Resolved.  4. Hyperglycemia: Continue current diabetic medications.   Patient expressed understanding and was in agreement with this plan. She also understands that She can call clinic at any time with any questions, concerns, or complaints.    Lloyd Huger, MD   08/24/2016 10:57 PM

## 2016-08-17 ENCOUNTER — Ambulatory Visit
Admission: RE | Admit: 2016-08-17 | Discharge: 2016-08-17 | Disposition: A | Discharge status: Home / Self Care | Payer: Medicare Other | Source: Ambulatory Visit | Attending: Oncology | Admitting: Oncology

## 2016-08-17 DIAGNOSIS — J984 Other disorders of lung: Secondary | ICD-10-CM | POA: Diagnosis not present

## 2016-08-17 DIAGNOSIS — R918 Other nonspecific abnormal finding of lung field: Secondary | ICD-10-CM | POA: Insufficient documentation

## 2016-08-17 MED ORDER — IOPAMIDOL (ISOVUE-300) INJECTION 61%
60.0000 mL | Freq: Once | INTRAVENOUS | Status: AC | PRN
  Administered 2016-08-17: 60 mL via INTRAVENOUS

## 2016-08-18 ENCOUNTER — Inpatient Hospital Stay: Payer: Medicare Other | Attending: Oncology | Admitting: Oncology

## 2016-08-18 VITALS — BP 140/79 | HR 122 | Temp 98.3°F | Resp 18 | Wt 158.4 lb

## 2016-08-18 DIAGNOSIS — Z853 Personal history of malignant neoplasm of breast: Secondary | ICD-10-CM | POA: Insufficient documentation

## 2016-08-18 DIAGNOSIS — Z9011 Acquired absence of right breast and nipple: Secondary | ICD-10-CM | POA: Diagnosis not present

## 2016-08-18 DIAGNOSIS — E119 Type 2 diabetes mellitus without complications: Secondary | ICD-10-CM | POA: Insufficient documentation

## 2016-08-18 DIAGNOSIS — R911 Solitary pulmonary nodule: Secondary | ICD-10-CM | POA: Diagnosis not present

## 2016-08-18 DIAGNOSIS — Z87891 Personal history of nicotine dependence: Secondary | ICD-10-CM | POA: Insufficient documentation

## 2016-08-18 DIAGNOSIS — E785 Hyperlipidemia, unspecified: Secondary | ICD-10-CM | POA: Insufficient documentation

## 2016-08-18 DIAGNOSIS — R918 Other nonspecific abnormal finding of lung field: Secondary | ICD-10-CM

## 2016-08-18 NOTE — Progress Notes (Signed)
Offers no complaints. States is feeling well. 

## 2016-08-24 ENCOUNTER — Ambulatory Visit: Payer: Medicare Other | Admitting: Oncology

## 2016-09-02 ENCOUNTER — Other Ambulatory Visit (INDEPENDENT_AMBULATORY_CARE_PROVIDER_SITE_OTHER): Payer: Self-pay | Admitting: Vascular Surgery

## 2016-09-02 DIAGNOSIS — K551 Chronic vascular disorders of intestine: Secondary | ICD-10-CM

## 2016-09-02 DIAGNOSIS — Z95820 Peripheral vascular angioplasty status with implants and grafts: Secondary | ICD-10-CM

## 2016-09-03 ENCOUNTER — Ambulatory Visit (INDEPENDENT_AMBULATORY_CARE_PROVIDER_SITE_OTHER): Payer: Medicare Other

## 2016-09-03 ENCOUNTER — Encounter (INDEPENDENT_AMBULATORY_CARE_PROVIDER_SITE_OTHER): Payer: Self-pay | Admitting: Vascular Surgery

## 2016-09-03 ENCOUNTER — Ambulatory Visit (INDEPENDENT_AMBULATORY_CARE_PROVIDER_SITE_OTHER): Payer: Medicare Other | Admitting: Vascular Surgery

## 2016-09-03 VITALS — BP 86/49 | HR 77 | Resp 16 | Ht 65.0 in | Wt 155.0 lb

## 2016-09-03 DIAGNOSIS — I10 Essential (primary) hypertension: Secondary | ICD-10-CM | POA: Diagnosis not present

## 2016-09-03 DIAGNOSIS — E785 Hyperlipidemia, unspecified: Secondary | ICD-10-CM

## 2016-09-03 DIAGNOSIS — Z95820 Peripheral vascular angioplasty status with implants and grafts: Secondary | ICD-10-CM

## 2016-09-03 DIAGNOSIS — K551 Chronic vascular disorders of intestine: Secondary | ICD-10-CM

## 2016-09-03 DIAGNOSIS — Z959 Presence of cardiac and vascular implant and graft, unspecified: Secondary | ICD-10-CM | POA: Diagnosis not present

## 2016-09-03 DIAGNOSIS — E118 Type 2 diabetes mellitus with unspecified complications: Secondary | ICD-10-CM | POA: Diagnosis not present

## 2016-09-10 ENCOUNTER — Encounter: Payer: Self-pay | Admitting: Endocrinology

## 2016-09-10 ENCOUNTER — Ambulatory Visit (INDEPENDENT_AMBULATORY_CARE_PROVIDER_SITE_OTHER): Payer: Medicare Other | Admitting: Endocrinology

## 2016-09-10 VITALS — BP 104/52 | HR 76 | Ht 65.0 in | Wt 155.0 lb

## 2016-09-10 DIAGNOSIS — E1165 Type 2 diabetes mellitus with hyperglycemia: Secondary | ICD-10-CM

## 2016-09-10 DIAGNOSIS — E1142 Type 2 diabetes mellitus with diabetic polyneuropathy: Secondary | ICD-10-CM

## 2016-09-10 DIAGNOSIS — Z794 Long term (current) use of insulin: Secondary | ICD-10-CM | POA: Diagnosis not present

## 2016-09-10 LAB — POCT GLYCOSYLATED HEMOGLOBIN (HGB A1C): HEMOGLOBIN A1C: 8

## 2016-09-10 MED ORDER — HYDROCODONE-ACETAMINOPHEN 10-325 MG PO TABS: 1.0000 | ORAL_TABLET | Freq: Two times a day (BID) | ORAL | 0 refills | Status: DC | PRN

## 2016-09-10 MED ORDER — HYDROCODONE-ACETAMINOPHEN 10-325 MG PO TABS: 1.0000 | ORAL_TABLET | Freq: Three times a day (TID) | ORAL | 0 refills | Status: DC | PRN

## 2016-09-10 NOTE — Patient Instructions (Signed)
Novolin N 45 in a.m,  30 at bedtime  REGULAR insulin 25 at breakfast, 40  BEFORE SUPPER   Do some sugars mid day and less at bedtime

## 2016-09-10 NOTE — Addendum Note (Signed)
Addended by: Nile Riggs on: 09/10/2016 12:11 PM   Modules accepted: Orders

## 2016-09-10 NOTE — Progress Notes (Signed)
Patient ID: Andrea Wallace, female   DOB: 01-05-1932, 83 y.o.   MRN: [id number]   Reason for Appointment: Diabetes follow-up   History of Present Illness   Diagnosis: Type 2 DIABETES MELITUS, date of diagnosis 1999  Previous history: She has been on insulin for several years to control her diabetes and usually requires large doses Has been on basal bolus regimen for the last few years, taking Lantus twice a day.  Her blood sugars tend to fluctuate significantly but her A1c is usually at a reasonable level She does not clearly benefit from GLP-1 drugs and had been taking Byetta to help with postprandial hyperglycemia, this has been stopped  She had been tried on Victoza, Byetta and Invokana previously and these were  stopped because of unclear benefit       RECENT history:  Insulin regimen: Novolin N 40 in a.m, 40 hs.  REGULAR insulin 25 at breakfast, 25-30 at supper  Her A1c has gone up to 8%, previously 7.3  Current management, blood sugar patterns and problems identified:  Although she was having relatively low sugars on the last visit because of decreased appetite they are now mostly high  She has a normal appetite since her abdominal angina was treated  However she is mixed up with her evening insulin doses and is taking the lower dose of Regular Insulin before supper and a higher dose of the NPH at bedtime  This is causing occasional low sugars fasting and most of the morning readings are around normal lately  She is having fairly consistently high readings late at night however  Sometimes when she has a normal sugar in the morning she will not take her insulin until after eating and today she did not take any insulin at all in the morning  Despite reminders she does not check her sugars after breakfast or midday and mostly checking twice a day now  Last episode of hypoglycemia was on 4/4, she had 2 consecutive readings in the mornings of 53 and 52 and 1 other low  sugars in the morning  Blood sugar readings by  download as follows  Mean values apply above for all meters except median for One Touch  PRE-MEAL Fasting Lunch Dinner Bedtime Overall  Glucose range:  52-240  180, 222   126-373    Mean/median: 132    254  198     Oral hypoglycemic drugs: Metformin ER, 500 mg 2 a day       Side effects from medications: None  Proper timing of medications in relation to meals: Yes.         Monitors blood glucose:  2-3 times a day on average.    Glucometer: Freestyle          Meals: 3 meals per day.   at breakfast she is eating a Half bagel now.  Mostly eating cheese crackers at lunch and full meal at dinner Usually avoiding all drinks with sugar          Physical activity: exercise: none, has back pain            Dietician visit: Most recent: Years ago       Complications: are: Neuropathy, microalbuminuria     Wt Readings from Last 3 Encounters:  09/10/16 155 lb (70.3 kg)  09/03/16 155 lb (70.3 kg)  08/18/16 158 lb 6.4 oz (71.8 kg)     Lab Results  Component Value Date   HGBA1C 7.3 06/09/2016  HGBA1C 8.3 12/24/2015   HGBA1C 7.8 09/24/2015   Lab Results  Component Value Date   MICROALBUR 122.6 (H) 12/24/2015   LDLCALC 32 12/05/2013   CREATININE 1.11 (H) 08/05/2016    OTHER problems discussed today: See review of systems   Allergies as of 09/10/2016      Reactions   Codeine Sulfate Itching      Medication List       Accurate as of 09/10/16 10:52 AM. Always use your most recent med list.          acetaminophen 500 MG tablet Commonly known as:  TYLENOL Take 500 mg by mouth daily.   aspirin 81 MG tablet Take 81 mg by mouth at bedtime.   CRESTOR 40 MG tablet Generic drug:  rosuvastatin Take 40 mg by mouth at bedtime.   DULoxetine 60 MG capsule Commonly known as:  CYMBALTA Take 1 capsule (60 mg total) by mouth daily.   FREESTYLE LIBRE SENSOR SYSTEM Misc 1 Device by Does not apply route every 14 (fourteen) days.  [id number]   gabapentin 600 MG tablet Commonly known as:  NEURONTIN TAKE ONE TABLET BY MOUTH THREE TIMES DAILY   HYDROcodone-acetaminophen 10-325 MG tablet Commonly known as:  NORCO Take 1 tablet by mouth 2 (two) times daily as needed for moderate pain.   hydroxypropyl methylcellulose / hypromellose 2.5 % ophthalmic solution Commonly known as:  ISOPTO TEARS / GONIOVISC Place 1 drop into both eyes 3 (three) times daily as needed for dry eyes.   insulin NPH Human 100 UNIT/ML injection Commonly known as:  NOVOLIN N RELION Inject 40 units daily   insulin regular 250 units/2.41mL (100 units/mL) injection Commonly known as:  NOVOLIN R RELION Use 25 units in the morning and 35 units at supper   montelukast 10 MG tablet Commonly known as:  SINGULAIR Take 10 mg by mouth at bedtime.   propranolol 40 MG tablet Commonly known as:  INDERAL Take 40 mg by mouth at bedtime.   traMADol 50 MG tablet Commonly known as:  ULTRAM Take 1 tablet (50 mg total) by mouth every 12 (twelve) hours as needed.   VITAMIN B-12 PO Take 1 tablet by mouth daily.   VITAMIN D PO Take 1 tablet by mouth daily. Reported on 09/24/2015       Allergies:  Allergies  Allergen Reactions  . Codeine Sulfate Itching    Past Medical History:  Diagnosis Date  . Allergy   . Anemia   . Arthritis   . Asthma   . Blood transfusion   . Breast cancer [facility]) 2011   Right  . Cancer ([place])    right breast  . Complication of anesthesia    diff  waking up  . Cough   . Diabetes mellitus   . Generalized headaches   . Hyperlipidemia   . Lung nodules   . Nasal congestion   . Neuromuscular disorder [facility])     Past Surgical History:  Procedure Laterality Date  . BACK SURGERY    . BREAST SURGERY  06/2007   right mastectomy  . BREAST SURGERY  2011   left  . FOOT SURGERY    . KIDNEY STONE SURGERY    . MASTECTOMY Right 2011   with Radiaiton  . REDUCTION MAMMAPLASTY Left 2012  . SHOULDER ARTHROSCOPY      bilateral  . TOTAL KNEE ARTHROPLASTY    . VISCERAL ANGIOGRAPHY N/A 08/06/2016   Procedure: Visceral Angiography;  Surgeon: Algernon Huxley, MD;  Location:  [place] [facility];  Service: Cardiovascular;  Laterality: N/A;  . VISCERAL ARTERY INTERVENTION N/A 08/06/2016   Procedure: Visceral Artery Intervention;  Surgeon: Algernon Huxley, MD;  Location: [place] [facility];  Service: Cardiovascular;  Laterality: N/A;    Family History  Problem Relation Age of Onset  . Osteoporosis Mother   . Heart disease Father   . Cancer Brother   . Breast cancer Brother   . Heart disease Sister     Social History:  reports that she quit smoking about 32 years ago. She has never used smokeless tobacco. She reports that she drinks alcohol. She reports that she does not use drugs.  Review of Systems:   Chronic kidney disease: Creatinine has been  upper normal,  followed periodically by nephrologist Last urine microalbumin was high, no treatment has been recommended with her blood pressure being low-normal   Lab Results  Component Value Date   CREATININE 1.11 (H) 08/05/2016    BLOOD pressure:  not on any antihypertensives or diuretics   HYPERLIPIDEMIA: The lipid abnormality consists of elevated  triglycerides, has been treated with TriCor Also followed by PCP Last triglycerides from PCP: 254    Lab Results  Component Value Date   CHOL 141 12/24/2015   HDL 42.10 12/24/2015   LDLCALC 32 12/05/2013   LDLDIRECT 70.0 12/24/2015   TRIG 206.0 (H) 12/24/2015   CHOLHDL 3 12/24/2015     She has chronic pains and paresthesia in her feet and lower legs Recently having some tingling and pains in her lower forearms and hands also Also has had back pain  She now said that she is taking her Cymbalta     Also on gabapentin 600 mg   Last diabetic foot exam was in 8/17 during some sensory loss She is using comfortable soft shoes which she thinks helps discomfort  She has had a multinodular goiter, this  has been euthyroid, Last TSH normal in 2/18 from PCP  Lab Results  Component Value Date   TSH 1.14 03/20/2015        Examination:   BP (!) 104/52   Pulse 76   Ht 5\' 5"  (1.651 m)   Wt 155 lb (70.3 kg)   BMI 25.79 kg/m   Body mass index is 25.79 kg/m.     ASSESSMENT/ PLAN:   Diabetes type 2:  See history of present illness for detailed discussion of  current management, blood sugar patterns and problems identified  Her A1c is now 8%, previously 7.3  She is on a regimen of NPH and Regular Insulin twice a day with low-dose metformin  Her blood sugars are much higher after supper since she has started eating better but also is taking a much lower dose of Regular Insulin at suppertime than prescribed previously Also taking more insulin at bedtime for the NPH causing low normal readings and 3 readings in the 50s also She probably has some high readings after breakfast or before lunch but checking only sporadically She is not eating excessive  amounts of carbohydrates at suppertime and appears to require more insulin at that time Currently has minimal physical activity because of pain  Recommendations:  Temporarily reduce the NPH by 5 units both morning and evening  Reduce regular insulin to 35 units  Been eating normally will go up to 15 at for the morning NPH since she denies to have high readings at suppertime  Also may need 45 units of regular at suppertime especially sugars are  consistently high after eating  She will not increase her NPH at bedtime even when eating normally  More consistent monitoring  NEUROPATHY: She is having some symptoms in her hands also none Will continue Cymbalta 60 mg daily for pain and depression She can take hydrocodone as needed, new prescription given for 100 tablets  Patient Instructions   Novolin N 45 in a.m,  30 at bedtime  REGULAR insulin 25 at breakfast, 40  BEFORE SUPPER   Do some sugars mid day and less at bedtime     Counseling time on subjects discussed above is over 50% of today's 25 minute visit    Timur Nibert 09/10/2016, 10:52 AM      Note: This office note was prepared with Estate agent. Any transcriptional errors that result from this process are unintentional.

## 2016-09-16 ENCOUNTER — Other Ambulatory Visit: Payer: Self-pay | Admitting: Endocrinology

## 2016-10-15 LAB — HM DIABETES EYE EXAM

## 2016-10-25 ENCOUNTER — Emergency Department (HOSPITAL_COMMUNITY)
Admission: EM | Admit: 2016-10-25 | Discharge: 2016-10-25 | Disposition: A | Discharge status: Home / Self Care | Payer: Medicare Other | Attending: Emergency Medicine | Admitting: Emergency Medicine

## 2016-10-25 ENCOUNTER — Encounter (HOSPITAL_COMMUNITY): Payer: Self-pay | Admitting: Emergency Medicine

## 2016-10-25 ENCOUNTER — Emergency Department (HOSPITAL_COMMUNITY): Payer: Medicare Other

## 2016-10-25 DIAGNOSIS — Z96653 Presence of artificial knee joint, bilateral: Secondary | ICD-10-CM | POA: Diagnosis not present

## 2016-10-25 DIAGNOSIS — E1142 Type 2 diabetes mellitus with diabetic polyneuropathy: Secondary | ICD-10-CM | POA: Insufficient documentation

## 2016-10-25 DIAGNOSIS — Z79899 Other long term (current) drug therapy: Secondary | ICD-10-CM | POA: Diagnosis not present

## 2016-10-25 DIAGNOSIS — N183 Chronic kidney disease, stage 3 (moderate): Secondary | ICD-10-CM | POA: Diagnosis not present

## 2016-10-25 DIAGNOSIS — Z853 Personal history of malignant neoplasm of breast: Secondary | ICD-10-CM | POA: Insufficient documentation

## 2016-10-25 DIAGNOSIS — Z794 Long term (current) use of insulin: Secondary | ICD-10-CM | POA: Insufficient documentation

## 2016-10-25 DIAGNOSIS — Y9301 Activity, walking, marching and hiking: Secondary | ICD-10-CM | POA: Insufficient documentation

## 2016-10-25 DIAGNOSIS — Z87891 Personal history of nicotine dependence: Secondary | ICD-10-CM | POA: Diagnosis not present

## 2016-10-25 DIAGNOSIS — W19XXXA Unspecified fall, initial encounter: Secondary | ICD-10-CM

## 2016-10-25 DIAGNOSIS — J45909 Unspecified asthma, uncomplicated: Secondary | ICD-10-CM | POA: Diagnosis not present

## 2016-10-25 DIAGNOSIS — I129 Hypertensive chronic kidney disease with stage 1 through stage 4 chronic kidney disease, or unspecified chronic kidney disease: Secondary | ICD-10-CM | POA: Insufficient documentation

## 2016-10-25 DIAGNOSIS — S52021A Displaced fracture of olecranon process without intraarticular extension of right ulna, initial encounter for closed fracture: Secondary | ICD-10-CM | POA: Diagnosis not present

## 2016-10-25 DIAGNOSIS — S59901A Unspecified injury of right elbow, initial encounter: Secondary | ICD-10-CM | POA: Diagnosis present

## 2016-10-25 DIAGNOSIS — Z7982 Long term (current) use of aspirin: Secondary | ICD-10-CM | POA: Insufficient documentation

## 2016-10-25 DIAGNOSIS — W0110XA Fall on same level from slipping, tripping and stumbling with subsequent striking against unspecified object, initial encounter: Secondary | ICD-10-CM | POA: Diagnosis not present

## 2016-10-25 DIAGNOSIS — Y929 Unspecified place or not applicable: Secondary | ICD-10-CM | POA: Diagnosis not present

## 2016-10-25 DIAGNOSIS — E1122 Type 2 diabetes mellitus with diabetic chronic kidney disease: Secondary | ICD-10-CM | POA: Insufficient documentation

## 2016-10-25 DIAGNOSIS — Y999 Unspecified external cause status: Secondary | ICD-10-CM | POA: Insufficient documentation

## 2016-10-25 MED ORDER — HYDROCODONE-ACETAMINOPHEN 5-325 MG PO TABS: 1.0000 | ORAL_TABLET | Freq: Four times a day (QID) | ORAL | 0 refills | Status: DC | PRN

## 2016-10-25 MED ORDER — BISACODYL 5 MG PO TBEC: 5.0000 mg | DELAYED_RELEASE_TABLET | Freq: Two times a day (BID) | ORAL | 0 refills | Status: DC

## 2016-10-25 MED ORDER — FENTANYL CITRATE (PF) 100 MCG/2ML IJ SOLN
25.0000 ug | Freq: Once | INTRAMUSCULAR | Status: AC
  Administered 2016-10-25: 25 ug via INTRAMUSCULAR
  Filled 2016-10-25: qty 2

## 2016-10-25 NOTE — ED Notes (Signed)
51mcg of Fentanyl wasted with Callie, RN in sharps.

## 2016-10-25 NOTE — ED Provider Notes (Signed)
[facility] DEPT Provider Note   CSN: [id number] Arrival date & time: 10/25/16  4944     History   Chief Complaint Chief Complaint  Patient presents with  . Fall    HPI Andrea Wallace is a 83 y.o. female.  HPI  Patient presents after a mechanical fall.  She recalls the entirety of the fall.  She typically walks with a cane, but left it aside and went to help her husband.  She recalls slipping, and falling and hitting her head as well as her UE and back. She has been ambulatory since the fall, but has persistent pain in the aforementioned areas.  The pain is sore, and seemingly worst in the R elbow.  No medication taken for pain relief thus far.  Past Medical History:  Diagnosis Date  . Allergy   . Anemia   . Arthritis   . Asthma   . Blood transfusion   . Breast cancer [facility]) 2011   Right  . Cancer ([place])    right breast  . Complication of anesthesia    diff  waking up  . Cough   . Diabetes mellitus   . Generalized headaches   . Hyperlipidemia   . Lung nodules   . Nasal congestion   . Neuromuscular disorder [facility])     Patient Active Problem List   Diagnosis Date Noted  . Essential hypertension, benign 07/28/2016  . Hyperlipidemia 07/28/2016  . Abdominal pain 07/28/2016  . Chronic mesenteric ischemia ([place]) 07/28/2016  . Diabetic polyneuropathy associated with type 2 diabetes mellitus ([place]) 02/25/2016  . Mass of lower lobe of left lung 02/16/2016  . Calculus of kidney 08/14/2015  . Abnormal presence of protein in urine 07/29/2015  . Osteopenia 12/18/2014  . Carotid arterial disease ([place]) 01/06/2014  . Headache, migraine 01/06/2014  . Lumbar canal stenosis 01/06/2014  . Carotid artery disease ([place]) 01/06/2014  . Back pain, chronic 10/30/2013  . Chronic kidney disease, stage III (moderate) 05/03/2013  . Type II diabetes mellitus, uncontrolled ([place]) 01/11/2013  . Other and unspecified hyperlipidemia 12/28/2012  . Multinodular goiter 11/01/2012  . Lumbar  stenosis with neurogenic claudication 06/29/2012  . Anemia associated with acute blood loss 06/29/2012  . Breast cancer ([place]) 03/16/2011    Past Surgical History:  Procedure Laterality Date  . BACK SURGERY    . BREAST SURGERY  06/2007   right mastectomy  . BREAST SURGERY  2011   left  . FOOT SURGERY    . KIDNEY STONE SURGERY    . MASTECTOMY Right 2011   with Radiaiton  . REDUCTION MAMMAPLASTY Left 2012  . SHOULDER ARTHROSCOPY     bilateral  . TOTAL KNEE ARTHROPLASTY    . VISCERAL ANGIOGRAPHY N/A 08/06/2016   Procedure: Visceral Angiography;  Surgeon: Algernon Huxley, MD;  Location: [place] CV LAB;  Service: Cardiovascular;  Laterality: N/A;  . VISCERAL ARTERY INTERVENTION N/A 08/06/2016   Procedure: Visceral Artery Intervention;  Surgeon: Algernon Huxley, MD;  Location: [place] [facility];  Service: Cardiovascular;  Laterality: N/A;    OB History    No data available       Home Medications    Prior to Admission medications   Medication Sig Start Date End Date Taking? Authorizing Provider  acetaminophen (TYLENOL) 500 MG tablet Take 500 mg by mouth daily.     [provider]  aspirin 81 MG tablet Take 81 mg by mouth at bedtime.     [provider]  Cholecalciferol (  VITAMIN D PO) Take 1 tablet by mouth daily. Reported on 09/24/2015    [provider]  Continuous Blood Gluc Sensor (FREESTYLE LIBRE SENSOR SYSTEM) MISC 1 Device by Does not apply route every 14 (fourteen) days. [id number] 06/09/16   Elayne Snare, MD  CRESTOR 40 MG tablet Take 40 mg by mouth at bedtime.  02/17/11   [provider]  Cyanocobalamin (VITAMIN B-12 PO) Take 1 tablet by mouth daily.    [provider]  DULoxetine (CYMBALTA) 60 MG capsule Take 1 capsule (60 mg total) by mouth daily. Patient taking differently: Take 60 mg by mouth at bedtime.  06/09/16   Elayne Snare, MD  gabapentin (NEURONTIN) 600 MG tablet TAKE ONE TABLET BY MOUTH THREE TIMES DAILY 03/16/16    Elayne Snare, MD  HYDROcodone-acetaminophen [facility]) 10-325 MG tablet Take 1 tablet by mouth 3 (three) times daily with meals as needed for moderate pain. 09/10/16   Elayne Snare, MD  hydroxypropyl methylcellulose / hypromellose (ISOPTO TEARS / GONIOVISC) 2.5 % ophthalmic solution Place 1 drop into both eyes 3 (three) times daily as needed for dry eyes.    [provider]  insulin regular (NOVOLIN R RELION) 250 units/2.29mL (100 units/mL) injection Use 25 units in the morning and 35 units at supper Patient taking differently: Use 22 units in the morning and 30 to 35 units at supper 06/12/16   Elayne Snare, MD  montelukast (SINGULAIR) 10 MG tablet Take 10 mg by mouth at bedtime.  03/02/11   [provider]  NOVOLIN N RELION 100 UNIT/ML injection INJECT 40 UNITS SUBCUTANEOUSLY ONCE DAILY 09/16/16   Elayne Snare, MD  propranolol (INDERAL) 40 MG tablet Take 40 mg by mouth at bedtime.    [provider]  traMADol (ULTRAM) 50 MG tablet Take 1 tablet (50 mg total) by mouth every 12 (twelve) hours as needed. Patient taking differently: Take 50 mg by mouth every 12 (twelve) hours as needed for moderate pain.  02/25/16   Elayne Snare, MD    Family History Family History  Problem Relation Age of Onset  . Osteoporosis Mother   . Heart disease Father   . Cancer Brother   . Breast cancer Brother   . Heart disease Sister     Social History Social History  Substance Use Topics  . Smoking status: Former Smoker    Quit date: 03/15/1984  . Smokeless tobacco: Never Used  . Alcohol use Yes     Comment: occ     Allergies   Codeine sulfate   Review of Systems Review of Systems  Constitutional:       Per HPI, otherwise negative  HENT:       Per HPI, otherwise negative  Respiratory:       Per HPI, otherwise negative  Cardiovascular:       Per HPI, otherwise negative  Gastrointestinal: Negative for nausea and vomiting.  Endocrine:       Negative aside from HPI  Genitourinary:         Neg aside from HPI   Musculoskeletal:       Per HPI, otherwise negative  Skin: Positive for wound.  Allergic/Immunologic: Negative for immunocompromised state.  Neurological: Negative for syncope and weakness.       Ambulates w cane at baseline  Hematological:       Prior breast CA, now w mass adjacent to heart     Physical Exam Updated Vital Signs BP (!) 143/49 (BP Location: Right Arm)   Pulse  81   Temp 98.6 F (37 C) (Oral)   Resp 14   Ht 5\' 5"  (1.651 m)   Wt 74.8 kg (165 lb)   SpO2 97%   BMI 27.46 kg/m   Physical Exam  Constitutional: She is oriented to person, place, and time. She appears well-developed and well-nourished. No distress.  HENT:  Head: Normocephalic.    Eyes: Conjunctivae and EOM are normal.  Neck: No spinous process tenderness and no muscular tenderness present. No neck rigidity. Normal range of motion present.  Cardiovascular: Normal rate and regular rhythm.   Pulmonary/Chest: Effort normal and breath sounds normal. No stridor. No respiratory distress.  Abdominal: She exhibits no distension.  Musculoskeletal: She exhibits no edema.  Neurological: She is alert and oriented to person, place, and time. She displays no tremor. No cranial nerve deficit. She exhibits normal muscle tone.  Skin: Skin is warm and dry.  Psychiatric: She has a normal mood and affect.  Nursing note and vitals reviewed.    ED Treatments / Results   Radiology Dg Lumbar Spine Complete  Result Date: 10/25/2016 CLINICAL DATA:  Status post fall, with lower back pain. Initial encounter. EXAM: LUMBAR SPINE - COMPLETE 4+ VIEW COMPARISON:  CT the abdomen and pelvis from 07/03/2016 FINDINGS: There is no evidence of fracture or subluxation. The patient is status post lumbar spinal fusion at L2-S1, with surrounding degenerative change. Vertebral bodies demonstrate normal height and alignment. The visualized bowel gas pattern is unremarkable in appearance; air and stool are noted  within the colon. The sacroiliac joints are within normal limits. Diffuse vascular calcifications are seen. A vascular stent is noted arising from the proximal abdominal aorta. IMPRESSION: 1. No evidence of fracture or subluxation along the lumbar spine. 2. Status post lumbar spinal fusion at L2-S1, with surrounding degenerative change. 3. Diffuse aortic atherosclerosis. Electronically Signed   By: Garald Balding M.D.   On: 10/25/2016 20:11   Dg Pelvis 1-2 Views  Result Date: 10/25/2016 CLINICAL DATA:  Status post fall, with concern for pelvic injury. Initial encounter. EXAM: PELVIS - 1-2 VIEW COMPARISON:  CT of the abdomen and pelvis performed 07/03/2016 FINDINGS: There is no evidence of fracture or dislocation. Lumbosacral spinal fusion hardware is partially imaged. Both femoral heads are seated normally within their respective acetabula. Mild sclerosis is noted at the right sacroiliac joint. The visualized bowel gas pattern is grossly unremarkable in appearance. IMPRESSION: No evidence of fracture or dislocation. Electronically Signed   By: Garald Balding M.D.   On: 10/25/2016 19:49   Dg Shoulder Right  Result Date: 10/25/2016 CLINICAL DATA:  Status post fall onto outstretched hand, with right shoulder pain. Initial encounter. EXAM: RIGHT SHOULDER - 2+ VIEW COMPARISON:  None. FINDINGS: There is no evidence of fracture or dislocation. There is chronic superior subluxation of the right humeral head. Mild degenerative change is noted at the right acromioclavicular joint. No significant soft tissue abnormalities are seen. The visualized portions of the right lung are clear. IMPRESSION: 1. No evidence of fracture or dislocation. 2. 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 Electronically Signed   By: Garald Balding M.D.   On: 10/25/2016 20:20   Dg Elbow 2 Views Right  Result Date: 10/25/2016 CLINICAL DATA:  Status post fall backwards, with right elbow pain. Initial encounter. EXAM: RIGHT ELBOW -  2 VIEW COMPARISON:  None. FINDINGS: There is a comminuted fracture of the olecranon, with proximal displacement of the olecranon fragment. An associated elbow joint effusion is noted. Diffuse  surrounding soft tissue swelling is noted. IMPRESSION: Comminuted fracture of the olecranon, with proximal displacement of the olecranon fragment. Associated elbow joint effusion is noted. Electronically Signed   By: Garald Balding M.D.   On: 10/25/2016 19:48   Dg Wrist Complete Right  Result Date: 10/25/2016 CLINICAL DATA:  Status post fall onto outstretched hand, with right wrist pain. Initial encounter. EXAM: RIGHT WRIST - COMPLETE 3+ VIEW COMPARISON:  None. FINDINGS: There is chronic deformity of the distal radius, and a chronically displaced ulnar styloid fracture. No definite acute fracture is seen. Soft tissue swelling is noted about the wrist. The carpal bones appear grossly intact, and demonstrate normal alignment. Mild degenerative change is noted at the first carpometacarpal joint. IMPRESSION: Chronic deformity of the distal radius, and chronically displaced ulnar styloid fracture. No acute fracture seen. Electronically Signed   By: Garald Balding M.D.   On: 10/25/2016 20:19   Dg Shoulder Left  Result Date: 10/25/2016 CLINICAL DATA:  Status post fall, with left shoulder pain. Initial encounter. EXAM: LEFT SHOULDER - 2+ VIEW COMPARISON:  None. FINDINGS: There is no evidence of fracture or dislocation. The left humeral head is seated within the glenoid fossa. Mild degenerative change is noted at the left acromioclavicular joint. No significant soft tissue abnormalities are seen. The visualized portions of the left lung are clear. IMPRESSION: No evidence of fracture or dislocation. Electronically Signed   By: Garald Balding M.D.   On: 10/25/2016 20:13   Dg Humerus Left  Result Date: 10/25/2016 CLINICAL DATA:  Status post fall onto outstretched hands, with left arm pain. Initial encounter. EXAM: LEFT HUMERUS -  2+ VIEW COMPARISON:  None. FINDINGS: There is no evidence of fracture or dislocation. The left humerus appears intact. The elbow joint is grossly unremarkable. No elbow joint effusion is identified. A small osseous fragment volar to the elbow joint is likely degenerative in nature. The left humeral head remains seated at the glenoid fossa. Mild degenerative change is noted at the left acromioclavicular joint. No definite soft tissue abnormalities are characterized on radiograph. IMPRESSION: No evidence of fracture or dislocation. Electronically Signed   By: Garald Balding M.D.   On: 10/25/2016 20:18    Procedures Procedures (including critical care time)  Medications Ordered in ED Medications  fentaNYL (SUBLIMAZE) injection 25 mcg (25 mcg Intramuscular Given 10/25/16 7858)   SPLINT APPLICATION Date/Time: 8:50 PM Authorized by: Carmin Muskrat Consent: Verbal consent obtained. Risks and benefits: risks, benefits and alternatives were discussed Consent given by: patient Splint applied by: orthopedic technician and myself with nursing assistance. Location details: R elbow- long arm posterior Splint type: orthoglass Supplies used: orthoglass Post-procedure: The splinted body part was neurovascularly unchanged following the procedure. Patient tolerance: Patient tolerated the procedure well with no immediate complications.  I discussed patient's case with our orthopedic colleagues. With concern for comminuted olecranon fracture the patient will have posterior splint applied, follow-up in the office. Patient is aware of this, consents to the procedure.  Update:, Patient states that she feels somewhat better after the arm has been immobilized, has received analgesia. She and family express understanding of the need to follow-up with orthopedics in 2 days.   Initial Impression / Assessment and Plan / ED Course  I have reviewed the triage vital signs and the nursing notes.  Pertinent labs &  imaging results that were available during my care of the patient were reviewed by me and considered in my medical decision making (see chart for details).  This elderly  female presents after mechanical fall. Patient's full recall of the event. Patient does have minor abrasions, but no actively bleeding wounds. Most notably, the patient found to have a comminuted olecranon fracture on the right. Patient tolerated splinting well per No other fractures identified, and the patient remained well for hours of monitoring in the emergency department.   Final Clinical Impressions(s) / ED Diagnoses  Fall, initial encounter Olecranon fracture, right, initial encounter   Carmin Muskrat, MD 10/25/16 2131

## 2016-10-25 NOTE — ED Notes (Signed)
Patient transported to X-ray 

## 2016-10-25 NOTE — ED Triage Notes (Signed)
Report from [facility]> Pt was walking without cane down 2-3 porch steps.  States she lost her balance, spun around, and fell backwards.  C/o quarter size hematoma to back of head, pain to R wrist, elbow, and shoulder.  Also reports L wrist pain that isn't as bad as R wrist.  Denies LOC.

## 2016-10-25 NOTE — Progress Notes (Signed)
Orthopedic Tech Progress Note Patient Details:  Andrea Wallace August 09, 1931 [id number]  Ortho Devices Type of Ortho Device: Arm sling, Post (long arm) splint Ortho Device/Splint Location: rue Ortho Device/Splint Interventions: Ordered, Application, Adjustment   [name] 10/25/2016, 9:25 PM

## 2016-10-27 ENCOUNTER — Other Ambulatory Visit: Payer: Self-pay | Admitting: Orthopedic Surgery

## 2016-10-28 ENCOUNTER — Inpatient Hospital Stay (HOSPITAL_COMMUNITY): Payer: Medicare Other | Admitting: Anesthesiology

## 2016-10-28 ENCOUNTER — Encounter (HOSPITAL_COMMUNITY): Payer: Self-pay | Admitting: *Deleted

## 2016-10-28 ENCOUNTER — Encounter (HOSPITAL_COMMUNITY)
Admission: RE | Disposition: A | Discharge status: Home / Self Care | Payer: Self-pay | Source: Ambulatory Visit | Attending: Orthopedic Surgery

## 2016-10-28 ENCOUNTER — Observation Stay (HOSPITAL_COMMUNITY)
Admission: RE | Admit: 2016-10-28 | Discharge: 2016-10-29 | Disposition: A | Discharge status: Home / Self Care | Payer: Medicare Other | Source: Ambulatory Visit | Attending: Orthopedic Surgery | Admitting: Orthopedic Surgery

## 2016-10-28 DIAGNOSIS — Z882 Allergy status to sulfonamides status: Secondary | ICD-10-CM | POA: Insufficient documentation

## 2016-10-28 DIAGNOSIS — E785 Hyperlipidemia, unspecified: Secondary | ICD-10-CM | POA: Insufficient documentation

## 2016-10-28 DIAGNOSIS — S52021A Displaced fracture of olecranon process without intraarticular extension of right ulna, initial encounter for closed fracture: Secondary | ICD-10-CM | POA: Diagnosis present

## 2016-10-28 DIAGNOSIS — Z79899 Other long term (current) drug therapy: Secondary | ICD-10-CM | POA: Diagnosis not present

## 2016-10-28 DIAGNOSIS — Z96659 Presence of unspecified artificial knee joint: Secondary | ICD-10-CM | POA: Diagnosis not present

## 2016-10-28 DIAGNOSIS — Z853 Personal history of malignant neoplasm of breast: Secondary | ICD-10-CM | POA: Diagnosis not present

## 2016-10-28 DIAGNOSIS — Z794 Long term (current) use of insulin: Secondary | ICD-10-CM | POA: Insufficient documentation

## 2016-10-28 DIAGNOSIS — Z809 Family history of malignant neoplasm, unspecified: Secondary | ICD-10-CM | POA: Diagnosis not present

## 2016-10-28 DIAGNOSIS — Z9011 Acquired absence of right breast and nipple: Secondary | ICD-10-CM | POA: Diagnosis not present

## 2016-10-28 DIAGNOSIS — Z8489 Family history of other specified conditions: Secondary | ICD-10-CM | POA: Insufficient documentation

## 2016-10-28 DIAGNOSIS — W19XXXA Unspecified fall, initial encounter: Secondary | ICD-10-CM | POA: Diagnosis not present

## 2016-10-28 DIAGNOSIS — M1990 Unspecified osteoarthritis, unspecified site: Secondary | ICD-10-CM | POA: Insufficient documentation

## 2016-10-28 DIAGNOSIS — Z87891 Personal history of nicotine dependence: Secondary | ICD-10-CM | POA: Diagnosis not present

## 2016-10-28 DIAGNOSIS — J45909 Unspecified asthma, uncomplicated: Secondary | ICD-10-CM | POA: Insufficient documentation

## 2016-10-28 DIAGNOSIS — E119 Type 2 diabetes mellitus without complications: Secondary | ICD-10-CM | POA: Insufficient documentation

## 2016-10-28 DIAGNOSIS — Z803 Family history of malignant neoplasm of breast: Secondary | ICD-10-CM | POA: Diagnosis not present

## 2016-10-28 DIAGNOSIS — S52031A Displaced fracture of olecranon process with intraarticular extension of right ulna, initial encounter for closed fracture: Secondary | ICD-10-CM | POA: Diagnosis present

## 2016-10-28 HISTORY — PX: ORIF ELBOW FRACTURE: SHX5031

## 2016-10-28 HISTORY — DX: Family history of other specified conditions: Z84.89

## 2016-10-28 HISTORY — DX: Personal history of urinary calculi: Z87.442

## 2016-10-28 LAB — BASIC METABOLIC PANEL
ANION GAP: 13 (ref 5–15)
BUN: 20 mg/dL (ref 6–20)
CALCIUM: 9.1 mg/dL (ref 8.9–10.3)
CO2: 23 mmol/L (ref 22–32)
Chloride: 97 mmol/L — ABNORMAL LOW (ref 101–111)
Creatinine, Ser: 1.06 mg/dL — ABNORMAL HIGH (ref 0.44–1.00)
GFR, EST AFRICAN AMERICAN: 55 mL/min — AB (ref >60)
GFR, EST NON AFRICAN AMERICAN: 48 mL/min — AB (ref >60)
GLUCOSE: 417 mg/dL — AB (ref 65–99)
POTASSIUM: 3.7 mmol/L (ref 3.5–5.1)
Sodium: 133 mmol/L — ABNORMAL LOW (ref 135–145)

## 2016-10-28 LAB — CBC
HEMATOCRIT: 30 % — AB (ref 36.0–46.0)
Hemoglobin: 9.3 g/dL — ABNORMAL LOW (ref 12.0–15.0)
MCH: 27 pg (ref 26.0–34.0)
MCHC: 31 g/dL (ref 30.0–36.0)
MCV: 87.2 fL (ref 78.0–100.0)
Platelets: 233 10*3/uL (ref 150–400)
RBC: 3.44 MIL/uL — AB (ref 3.87–5.11)
RDW: 16.3 % — AB (ref 11.5–15.5)
WBC: 7.1 10*3/uL (ref 4.0–10.5)

## 2016-10-28 LAB — GLUCOSE, CAPILLARY
GLUCOSE-CAPILLARY: 332 mg/dL — AB (ref 65–99)
GLUCOSE-CAPILLARY: 249 mg/dL — AB (ref 65–99)
Glucose-Capillary: 399 mg/dL — ABNORMAL HIGH (ref 65–99)
Glucose-Capillary: 326 mg/dL — ABNORMAL HIGH (ref 65–99)
Glucose-Capillary: 173 mg/dL — ABNORMAL HIGH (ref 65–99)
Glucose-Capillary: 163 mg/dL — ABNORMAL HIGH (ref 65–99)

## 2016-10-28 SURGERY — OPEN REDUCTION INTERNAL FIXATION (ORIF) ELBOW/OLECRANON FRACTURE
Anesthesia: Regional | Laterality: Right

## 2016-10-28 MED ORDER — ONDANSETRON HCL 4 MG/2ML IJ SOLN: 4.0000 mg | Freq: Once | INTRAMUSCULAR | Status: DC | PRN

## 2016-10-28 MED ORDER — HYDROMORPHONE HCL 1 MG/ML IJ SOLN
0.5000 mg | INTRAMUSCULAR | Status: DC | PRN
  Administered 2016-10-29: 0.5 mg via INTRAVENOUS
  Filled 2016-10-28: qty 1

## 2016-10-28 MED ORDER — MIDAZOLAM HCL 2 MG/2ML IJ SOLN
0.5000 mg | Freq: Once | INTRAMUSCULAR | Status: AC
  Administered 2016-10-28: 0.5 mg via INTRAVENOUS
  Filled 2016-10-28: qty 0.5

## 2016-10-28 MED ORDER — PHENYLEPHRINE HCL 10 MG/ML IJ SOLN
INTRAVENOUS | Status: DC | PRN
  Administered 2016-10-28: 25 ug/min via INTRAVENOUS

## 2016-10-28 MED ORDER — FENTANYL CITRATE (PF) 100 MCG/2ML IJ SOLN
INTRAMUSCULAR | Status: AC
  Administered 2016-10-28: 50 ug via INTRAVENOUS
  Filled 2016-10-28: qty 2

## 2016-10-28 MED ORDER — GABAPENTIN 600 MG PO TABS
600.0000 mg | ORAL_TABLET | Freq: Three times a day (TID) | ORAL | Status: DC
  Administered 2016-10-28 – 2016-10-29 (×3): 600 mg via ORAL
  Filled 2016-10-28 (×3): qty 1

## 2016-10-28 MED ORDER — INSULIN ASPART 100 UNIT/ML ~~LOC~~ SOLN
SUBCUTANEOUS | Status: AC
  Filled 2016-10-28: qty 1

## 2016-10-28 MED ORDER — ASPIRIN EC 81 MG PO TBEC
81.0000 mg | DELAYED_RELEASE_TABLET | Freq: Every day | ORAL | Status: DC
  Administered 2016-10-28: 81 mg via ORAL
  Filled 2016-10-28 (×2): qty 1

## 2016-10-28 MED ORDER — LACTATED RINGERS IV SOLN
Freq: Once | INTRAVENOUS | Status: AC
  Administered 2016-10-28: 09:00:00 via INTRAVENOUS

## 2016-10-28 MED ORDER — FENTANYL CITRATE (PF) 100 MCG/2ML IJ SOLN
25.0000 ug | INTRAMUSCULAR | Status: DC | PRN
Start: 2016-10-28 — End: 2016-10-28

## 2016-10-28 MED ORDER — FENTANYL CITRATE (PF) 100 MCG/2ML IJ SOLN: 25.0000 ug | INTRAMUSCULAR | Status: DC | PRN

## 2016-10-28 MED ORDER — ONDANSETRON HCL 4 MG/2ML IJ SOLN: 4.0000 mg | Freq: Four times a day (QID) | INTRAMUSCULAR | Status: DC | PRN

## 2016-10-28 MED ORDER — MIDAZOLAM HCL 2 MG/2ML IJ SOLN
INTRAMUSCULAR | Status: AC
  Administered 2016-10-28: 0.5 mg via INTRAVENOUS
  Filled 2016-10-28: qty 2

## 2016-10-28 MED ORDER — PROPRANOLOL HCL 40 MG PO TABS
40.0000 mg | ORAL_TABLET | Freq: Once | ORAL | Status: AC
  Administered 2016-10-28: 40 mg via ORAL
  Filled 2016-10-28 (×2): qty 1

## 2016-10-28 MED ORDER — INSULIN ASPART 100 UNIT/ML ~~LOC~~ SOLN
SUBCUTANEOUS | Status: AC
  Administered 2016-10-28: 10 [IU] via SUBCUTANEOUS
  Filled 2016-10-28: qty 1

## 2016-10-28 MED ORDER — CHLORHEXIDINE GLUCONATE 4 % EX LIQD: 60.0000 mL | Freq: Once | CUTANEOUS | Status: DC

## 2016-10-28 MED ORDER — ACETAMINOPHEN 325 MG PO TABS
650.0000 mg | ORAL_TABLET | Freq: Four times a day (QID) | ORAL | Status: DC | PRN
  Administered 2016-10-28 – 2016-10-29 (×2): 650 mg via ORAL
  Filled 2016-10-28 (×2): qty 2

## 2016-10-28 MED ORDER — PROPRANOLOL HCL 20 MG PO TABS
40.0000 mg | ORAL_TABLET | Freq: Every day | ORAL | Status: DC
  Administered 2016-10-28: 40 mg via ORAL
  Filled 2016-10-28: qty 2

## 2016-10-28 MED ORDER — INSULIN ASPART 100 UNIT/ML ~~LOC~~ SOLN
SUBCUTANEOUS | Status: AC
  Administered 2016-10-28: 10 [IU]
  Filled 2016-10-28: qty 1

## 2016-10-28 MED ORDER — INSULIN ASPART 100 UNIT/ML ~~LOC~~ SOLN
10.0000 [IU] | Freq: Once | SUBCUTANEOUS | Status: AC
  Administered 2016-10-28: 10 [IU] via SUBCUTANEOUS

## 2016-10-28 MED ORDER — INSULIN ASPART 100 UNIT/ML ~~LOC~~ SOLN
0.0000 [IU] | Freq: Three times a day (TID) | SUBCUTANEOUS | Status: DC
  Administered 2016-10-28: 3 [IU] via SUBCUTANEOUS
  Administered 2016-10-29: 5 [IU] via SUBCUTANEOUS

## 2016-10-28 MED ORDER — INSULIN NPH (HUMAN) (ISOPHANE) 100 UNIT/ML ~~LOC~~ SUSP
20.0000 [IU] | Freq: Every day | SUBCUTANEOUS | Status: DC
  Administered 2016-10-28: 20 [IU] via SUBCUTANEOUS
  Filled 2016-10-28: qty 10

## 2016-10-28 MED ORDER — CEFAZOLIN SODIUM-DEXTROSE 1-4 GM/50ML-% IV SOLN
1.0000 g | Freq: Four times a day (QID) | INTRAVENOUS | Status: AC
  Administered 2016-10-28 – 2016-10-29 (×3): 1 g via INTRAVENOUS
  Filled 2016-10-28 (×3): qty 50

## 2016-10-28 MED ORDER — ACETAMINOPHEN 650 MG RE SUPP: 650.0000 mg | Freq: Four times a day (QID) | RECTAL | Status: DC | PRN

## 2016-10-28 MED ORDER — CEFAZOLIN SODIUM-DEXTROSE 2-4 GM/100ML-% IV SOLN
2.0000 g | INTRAVENOUS | Status: AC
  Administered 2016-10-28: 2 g via INTRAVENOUS
  Filled 2016-10-28: qty 100

## 2016-10-28 MED ORDER — 0.9 % SODIUM CHLORIDE (POUR BTL) OPTIME
TOPICAL | Status: DC | PRN
  Administered 2016-10-28: 1000 mL

## 2016-10-28 MED ORDER — FENTANYL CITRATE (PF) 250 MCG/5ML IJ SOLN
INTRAMUSCULAR | Status: AC
  Filled 2016-10-28: qty 5

## 2016-10-28 MED ORDER — INSULIN ASPART 100 UNIT/ML ~~LOC~~ SOLN
5.0000 [IU] | Freq: Once | SUBCUTANEOUS | Status: AC
  Administered 2016-10-28: 5 [IU] via SUBCUTANEOUS

## 2016-10-28 MED ORDER — PROPOFOL 10 MG/ML IV BOLUS
INTRAVENOUS | Status: AC
  Filled 2016-10-28: qty 20

## 2016-10-28 MED ORDER — METHOCARBAMOL 500 MG PO TABS
500.0000 mg | ORAL_TABLET | Freq: Four times a day (QID) | ORAL | Status: DC | PRN
  Administered 2016-10-29 (×2): 500 mg via ORAL
  Filled 2016-10-28 (×2): qty 1

## 2016-10-28 MED ORDER — SODIUM CHLORIDE 0.9 % IV SOLN
INTRAVENOUS | Status: DC
  Administered 2016-10-28: 16:00:00 via INTRAVENOUS

## 2016-10-28 MED ORDER — PROPOFOL 500 MG/50ML IV EMUL
INTRAVENOUS | Status: DC | PRN
  Administered 2016-10-28: 100 ug/kg/min via INTRAVENOUS

## 2016-10-28 MED ORDER — FENTANYL CITRATE (PF) 100 MCG/2ML IJ SOLN
50.0000 ug | Freq: Once | INTRAMUSCULAR | Status: AC
  Administered 2016-10-28: 50 ug via INTRAVENOUS
  Filled 2016-10-28: qty 1

## 2016-10-28 MED ORDER — HYDROCODONE-ACETAMINOPHEN 5-325 MG PO TABS
1.0000 | ORAL_TABLET | Freq: Four times a day (QID) | ORAL | Status: DC | PRN
  Administered 2016-10-28 – 2016-10-29 (×3): 2 via ORAL
  Filled 2016-10-28 (×4): qty 2

## 2016-10-28 MED ORDER — MONTELUKAST SODIUM 10 MG PO TABS
10.0000 mg | ORAL_TABLET | Freq: Every day | ORAL | Status: DC
  Administered 2016-10-28: 10 mg via ORAL
  Filled 2016-10-28: qty 1

## 2016-10-28 MED ORDER — POLYETHYLENE GLYCOL 3350 17 G PO PACK: 17.0000 g | PACK | Freq: Every day | ORAL | Status: DC | PRN

## 2016-10-28 MED ORDER — METHOCARBAMOL 1000 MG/10ML IJ SOLN
500.0000 mg | Freq: Four times a day (QID) | INTRAVENOUS | Status: DC | PRN
  Filled 2016-10-28: qty 5

## 2016-10-28 MED ORDER — BISACODYL 5 MG PO TBEC
10.0000 mg | DELAYED_RELEASE_TABLET | Freq: Every day | ORAL | Status: DC
  Administered 2016-10-28: 10 mg via ORAL
  Filled 2016-10-28: qty 2

## 2016-10-28 MED ORDER — ONDANSETRON HCL 4 MG PO TABS: 4.0000 mg | ORAL_TABLET | Freq: Four times a day (QID) | ORAL | Status: DC | PRN

## 2016-10-28 SURGICAL SUPPLY — 67 items
BANDAGE ACE 3X5.8 VEL STRL LF (GAUZE/BANDAGES/DRESSINGS) IMPLANT
BANDAGE ACE 4X5 VEL STRL LF (GAUZE/BANDAGES/DRESSINGS) ×2 IMPLANT
BIT DRILL 2.5X2.75 QC CALB (BIT) ×2 IMPLANT
BIT DRILL CALIBRATED 2.7 (BIT) ×1 IMPLANT
BIT DRILL CALIBRATED 2.7MM (BIT) ×1
BNDG CMPR 9X4 STRL LF SNTH (GAUZE/BANDAGES/DRESSINGS) ×1
BNDG COHESIVE 4X5 TAN STRL (GAUZE/BANDAGES/DRESSINGS) ×3 IMPLANT
BNDG ESMARK 4X9 LF (GAUZE/BANDAGES/DRESSINGS) ×3 IMPLANT
BNDG GAUZE ELAST 4 BULKY (GAUZE/BANDAGES/DRESSINGS) IMPLANT
CORDS BIPOLAR (ELECTRODE) ×1 IMPLANT
COVER MAYO STAND STRL (DRAPES) ×3 IMPLANT
COVER SURGICAL LIGHT HANDLE (MISCELLANEOUS) ×3 IMPLANT
CUFF TOURNIQUET SINGLE 18IN (TOURNIQUET CUFF) ×3 IMPLANT
CUFF TOURNIQUET SINGLE 24IN (TOURNIQUET CUFF) IMPLANT
DRAPE INCISE IOBAN 66X45 STRL (DRAPES) ×3 IMPLANT
DRAPE OEC MINIVIEW 54X84 (DRAPES) ×2 IMPLANT
DRSG PAD ABDOMINAL 8X10 ST (GAUZE/BANDAGES/DRESSINGS) ×6 IMPLANT
GAUZE SPONGE 4X4 12PLY STRL (GAUZE/BANDAGES/DRESSINGS) ×2 IMPLANT
GAUZE XEROFORM 1X8 LF (GAUZE/BANDAGES/DRESSINGS) ×2 IMPLANT
GLOVE BIOGEL PI IND STRL 8 (GLOVE) ×2 IMPLANT
GLOVE BIOGEL PI INDICATOR 8 (GLOVE) ×4
GLOVE ECLIPSE 7.5 STRL STRAW (GLOVE) ×6 IMPLANT
GOWN STRL REUS W/ TWL LRG LVL3 (GOWN DISPOSABLE) ×1 IMPLANT
GOWN STRL REUS W/ TWL XL LVL3 (GOWN DISPOSABLE) ×2 IMPLANT
GOWN STRL REUS W/TWL LRG LVL3 (GOWN DISPOSABLE) ×3
GOWN STRL REUS W/TWL XL LVL3 (GOWN DISPOSABLE) ×6
K-WIRE FIXATION 2.0X6 (WIRE) ×6
KIT BASIN OR (CUSTOM PROCEDURE TRAY) ×3 IMPLANT
KIT ROOM TURNOVER OR (KITS) ×3 IMPLANT
KWIRE FIXATION 2.0X6 (WIRE) IMPLANT
LOOP VESSEL MAXI BLUE (MISCELLANEOUS) ×2 IMPLANT
MANIFOLD NEPTUNE II (INSTRUMENTS) ×3 IMPLANT
NDL HYPO 25GX1X1/2 BEV (NEEDLE) IMPLANT
NEEDLE HYPO 25GX1X1/2 BEV (NEEDLE) ×3 IMPLANT
NS IRRIG 1000ML POUR BTL (IV SOLUTION) ×3 IMPLANT
PACK ORTHO EXTREMITY (CUSTOM PROCEDURE TRAY) ×3 IMPLANT
PAD ARMBOARD 7.5X6 YLW CONV (MISCELLANEOUS) ×6 IMPLANT
PAD CAST 4YDX4 CTTN HI CHSV (CAST SUPPLIES) IMPLANT
PADDING CAST COTTON 4X4 STRL (CAST SUPPLIES)
PLATE OLECRANON LRG (Plate) ×2 IMPLANT
SCREW CORT T15 24X3.5XST LCK (Screw) IMPLANT
SCREW CORTICAL 3.5X24MM (Screw) ×3 IMPLANT
SCREW LOCK CORT STAR 3.5X10 (Screw) ×2 IMPLANT
SCREW LOCK CORT STAR 3.5X16 (Screw) ×2 IMPLANT
SCREW LOCK CORT STAR 3.5X18 (Screw) ×6 IMPLANT
SCREW LOCK CORT STAR 3.5X22 (Screw) ×2 IMPLANT
SCREW LOW PROFILE 3.5X48MM (Screw) ×2 IMPLANT
SCRUB BETADINE 4OZ XXX (MISCELLANEOUS) ×2 IMPLANT
SLING ARM IMMOBILIZER LRG (SOFTGOODS) ×2 IMPLANT
SOL PREP POV-IOD 4OZ 10% (MISCELLANEOUS) ×3 IMPLANT
SPECIMEN JAR SMALL (MISCELLANEOUS) ×1 IMPLANT
SUCTION FRAZIER HANDLE 10FR (MISCELLANEOUS)
SUCTION TUBE FRAZIER 10FR DISP (MISCELLANEOUS) IMPLANT
SUT ETHILON 2 0 FSLX (SUTURE) ×2 IMPLANT
SUT ETHILON 3 0 PS 1 (SUTURE) ×2 IMPLANT
SUT MERSILENE 4 0 P 3 (SUTURE) IMPLANT
SUT PROLENE 4 0 PS 2 18 (SUTURE) IMPLANT
SUT VIC AB 2-0 CT1 27 (SUTURE) ×3
SUT VIC AB 2-0 CT1 TAPERPNT 27 (SUTURE) IMPLANT
SYR CONTROL 10ML LL (SYRINGE) ×2 IMPLANT
TOWEL OR 17X24 6PK STRL BLUE (TOWEL DISPOSABLE) ×3 IMPLANT
TOWEL OR 17X26 10 PK STRL BLUE (TOWEL DISPOSABLE) ×6 IMPLANT
TUBE CONNECTING 12'X1/4 (SUCTIONS) ×1
TUBE CONNECTING 12X1/4 (SUCTIONS) ×1 IMPLANT
UNDERPAD 30X30 (UNDERPADS AND DIAPERS) ×3 IMPLANT
WASHER 3.5MM (Orthopedic Implant) ×2 IMPLANT
WATER STERILE IRR 1000ML POUR (IV SOLUTION) ×3 IMPLANT

## 2016-10-28 NOTE — Anesthesia Preprocedure Evaluation (Addendum)
Anesthesia Evaluation  Patient identified by MRN, date of birth, ID band Patient awake    Reviewed: Allergy & Precautions, NPO status , Patient's Chart, lab work & pertinent test results  Airway Mallampati: II  TM Distance: >3 FB     Dental  (+) Teeth Intact, Dental Advisory Given   Pulmonary former smoker,     + decreased breath sounds      Cardiovascular  Rhythm:Regular Rate:Normal     Neuro/Psych    GI/Hepatic   Endo/Other  diabetes  Renal/GU      Musculoskeletal   Abdominal   Peds  Hematology   Anesthesia Other Findings   Reproductive/Obstetrics                            Anesthesia Physical Anesthesia Plan  ASA: III  Anesthesia Plan: General and Regional   Post-op Pain Management:    Induction: Intravenous  PONV Risk Score and Plan: Ondansetron  Airway Management Planned: Oral ETT  Additional Equipment:   Intra-op Plan:   Post-operative Plan:   Informed Consent: I have reviewed the patients History and Physical, chart, labs and discussed the procedure including the risks, benefits and alternatives for the proposed anesthesia with the patient or authorized representative who has indicated his/her understanding and acceptance.     Plan Discussed with: CRNA and Anesthesiologist  Anesthesia Plan Comments:         Anesthesia Quick Evaluation

## 2016-10-28 NOTE — Brief Op Note (Signed)
10/28/2016  11:27 AM  PATIENT:  Andrea Wallace  83 y.o. female  PRE-OPERATIVE DIAGNOSIS:  DISPLACED RIGHT OLECRANON FRACTURE  POST-OPERATIVE DIAGNOSIS:  DISPLACED RIGHT OLECRANON FRACTURE  PROCEDURE:  Procedure(s): OPEN REDUCTION INTERNAL FIXATION (ORIF) ELBOW/OLECRANON FRACTURE (Right)  SURGEON:  Surgeon(s) and Role:    Dorna Leitz, MD - Primary  PHYSICIAN ASSISTANT:   ASSISTANTS: bethune   ANESTHESIA:   regional  EBL:  No intake/output data recorded.  BLOOD ADMINISTERED:none  DRAINS: none   LOCAL MEDICATIONS USED:  NONE  SPECIMEN:  No Specimen  DISPOSITION OF SPECIMEN:  N/A  COUNTS:  YES  TOURNIQUET:  * Missing tourniquet times found for documented tourniquets in log:  [id number] *  DICTATION: .Other Dictation: Dictation Number [id number]  PLAN OF CARE: Admit for overnight observation  PATIENT DISPOSITION:  PACU - hemodynamically stable.   Delay start of Pharmacological VTE agent (>24hrs) due to surgical blood loss or risk of bleeding: no

## 2016-10-28 NOTE — Anesthesia Postprocedure Evaluation (Signed)
Anesthesia Post Note  Patient: Andrea Wallace  Procedure(s) Performed: Procedure(s) (LRB): OPEN REDUCTION INTERNAL FIXATION (ORIF) ELBOW/OLECRANON FRACTURE (Right)     Patient location during evaluation: PACU Anesthesia Type: MAC Level of consciousness: awake, awake and alert and oriented Pain management: pain level controlled Vital Signs Assessment: post-procedure vital signs reviewed and stable Respiratory status: spontaneous breathing, nonlabored ventilation and respiratory function stable Cardiovascular status: blood pressure returned to baseline Postop Assessment: no headache Anesthetic complications: no    Last Vitals:  Vitals:   10/28/16 1333 10/28/16 1407  BP: (!) 123/59 (!) 126/46  Pulse: 82 80  Resp: 19 16  Temp: 36.7 C 36.6 C    Last Pain:  Vitals:   10/28/16 1605  TempSrc:   PainSc: 4                  Ksean Vale COKER

## 2016-10-28 NOTE — Transfer of Care (Signed)
Immediate Anesthesia Transfer of Care Note  Patient: Andrea Wallace  Procedure(s) Performed: Procedure(s): OPEN REDUCTION INTERNAL FIXATION (ORIF) ELBOW/OLECRANON FRACTURE (Right)  Patient Location: PACU  Anesthesia Type:MAC combined with regional for post-op pain  Level of Consciousness: awake, alert  and oriented  Airway & Oxygen Therapy: Patient Spontanous Breathing and Patient connected to nasal cannula oxygen  Post-op Assessment: Report given to RN and Post -op Vital signs reviewed and stable  Post vital signs: Reviewed and stable  Last Vitals:  Vitals:   10/28/16 1230 10/28/16 1235  BP:  (!) 141/49  Pulse: 80 82  Resp: 17 20  Temp:      Last Pain:  Vitals:   10/28/16 1233  TempSrc:   PainSc: 0-No pain      Patients Stated Pain Goal: 3 (81/15/72 6203)  Complications: No apparent anesthesia complications

## 2016-10-28 NOTE — Evaluation (Signed)
Occupational Therapy Evaluation Patient Details Name: Andrea Wallace MRN: [id number] DOB: [birth year] Today's Date: 10/28/2016    History of Present Illness  83 y.o. Female admitted to ED for mechanical fall on 10/25/16 and sustained a right olecranon fracture. Underwent ORIF of elbow on 10/28/16. PMH of breast cancer, DM, kidney stones, back surgery (2009), TKA, and shoulder arthroscopy (2012).    Clinical Impression   PTA, pt lived with her husband and was independent. Pt currently requires Min-Mod A for ADLs and Min A for functional mobility. Pt performed stand pivot to [facility] with Min A for safe standing balance. Pt performed toilet hygiene and clothing management with Min A to pull underwear over hips. Pt would benefit from acute OT to increase her safety and independence with ADLs and functional mobility. At this time, recommend dc to SNF for further OT to optimize pt independence prior to transitioning home.     Follow Up Recommendations  SNF;Supervision/Assistance - 24 hour;Other (comment) (Pending pt progress)    Equipment Recommendations  Other (comment) (Pending pt progress)    Recommendations for Other Services PT consult     Precautions / Restrictions Precautions Precautions: Fall Restrictions Weight Bearing Restrictions: Yes RUE Weight Bearing: Non weight bearing      Mobility Bed Mobility                  Transfers Overall transfer level: Needs assistance Equipment used: 1 person hand held assist Transfers: Sit to/from Stand;Stand Pivot Transfers Sit to Stand: Min assist Stand pivot transfers: Min assist       General transfer comment: Min A for standing balance    Balance Overall balance assessment: History of Falls;Needs assistance Sitting-balance support: Feet supported;No upper extremity supported Sitting balance-Leahy Scale: Fair Sitting balance - Comments: Maintains sitting at EOB   Standing balance support: No upper extremity supported;During  functional activity Standing balance-Leahy Scale: Fair Standing balance comment: Able to maintain static balance without physical A. However, Min A for dynamic and safety                           ADL either performed or assessed with clinical judgement   ADL Overall ADL's : Needs assistance/impaired Eating/Feeding: Sitting;Minimal assistance   Grooming: Sitting;Minimal assistance   Upper Body Bathing: Sitting;Moderate assistance   Lower Body Bathing: Sit to/from stand;Moderate assistance   Upper Body Dressing : Moderate assistance;Sitting   Lower Body Dressing: Moderate assistance;Sit to/from stand   Toilet Transfer: Stand-pivot;BSC;Cueing for sequencing;Cueing for safety;Minimal assistance Toilet Transfer Details (indicate cue type and reason): Min A for standing balance Toileting- Clothing Manipulation and Hygiene: Minimal assistance;Sit to/from stand Toileting - Clothing Manipulation Details (indicate cue type and reason): Min A for standing balance     Functional mobility during ADLs: Minimal assistance General ADL Comments: Pt demonstrating decreased function due to fx on dominant arm.  Pt may be in less pain at this session since spinal block still in place.      Vision         Perception     Praxis      Pertinent Vitals/Pain Pain Assessment: Faces Faces Pain Scale: Hurts even more Pain Location: R arm Pain Descriptors / Indicators: Constant;Discomfort;Grimacing;Guarding;Shooting Pain Intervention(s): Limited activity within patient's tolerance;Monitored during session;Repositioned     Hand Dominance Right   Extremity/Trunk Assessment Upper Extremity Assessment Upper Extremity Assessment: RUE deficits/detail RUE Deficits / Details: R olecranon fracture RUE: Unable to fully assess due to immobilization;Unable  to fully assess due to pain RUE Coordination: decreased fine motor;decreased gross motor   Lower Extremity Assessment Lower Extremity  Assessment: Defer to PT evaluation;Generalized weakness   Cervical / Trunk Assessment Cervical / Trunk Assessment: Kyphotic   Communication Communication Communication: No difficulties   Cognition Arousal/Alertness: Awake/alert Behavior During Therapy: WFL for tasks assessed/performed Overall Cognitive Status: Within Functional Limits for tasks assessed                                     General Comments  SpO2 in low 90s. Placed pt back on 2L O2    Exercises     Shoulder Instructions      Home Living Family/patient expects to be discharged to:: Private residence Living Arrangements: Spouse/significant other Available Help at Discharge: Family;Available 24 hours/day Type of Home: House Home Access: Stairs to enter [employer] of Steps: 2 Entrance Stairs-Rails: None Home Layout: One level     Bathroom Shower/Tub: Tub/shower unit (door?)   Bathroom Toilet: Standard     Home Equipment: Bedside commode;Cane - single point Pt states "I have one of those potty things. But it is in the attic somewhere. And I refuse to let me husband go up there to get it."          Prior Functioning/Environment Level of Independence: Independent                 OT Problem List: Decreased strength;Decreased range of motion;Decreased activity tolerance;Impaired balance (sitting and/or standing);Decreased safety awareness;Decreased knowledge of use of DME or AE;Decreased knowledge of precautions;Pain      OT Treatment/Interventions: Self-care/ADL training;Therapeutic exercise;Energy conservation;DME and/or AE instruction;Therapeutic activities;Patient/family education    OT Goals(Current goals can be found in the care plan section) Acute Rehab OT Goals Patient Stated Goal: Stop pain OT Goal Formulation: With patient Time For Goal Achievement: 11/11/16 Potential to Achieve Goals: Good ADL Goals Pt Will Perform Grooming: with min guard assist;standing Pt  Will Perform Upper Body Bathing: with set-up;with supervision;with caregiver independent in assisting;sitting Pt Will Perform Lower Body Bathing: with min guard assist;sit to/from stand Pt Will Perform Upper Body Dressing: with set-up;with supervision;with caregiver independent in assisting;sitting Pt Will Perform Lower Body Dressing: with min guard assist;sit to/from stand Pt Will Transfer to Toilet: with min guard assist;ambulating;bedside commode Pt Will Perform Toileting - Clothing Manipulation and hygiene: with min guard assist;sit to/from stand Pt Will Perform Tub/Shower Transfer: Tub transfer;3 in 1;with min assist;ambulating  OT Frequency: Min 3X/week   Barriers to D/C:            Co-evaluation              AM-PAC PT "6 Clicks" Daily Activity     Outcome Measure Help from another person eating meals?: None Help from another person taking care of personal grooming?: A Little Help from another person toileting, which includes using toliet, bedpan, or urinal?: A Little Help from another person bathing (including washing, rinsing, drying)?: A Lot Help from another person to put on and taking off regular upper body clothing?: A Lot Help from another person to put on and taking off regular lower body clothing?: A Lot 6 Click Score: 16   End of Session Equipment Utilized During Treatment: Gait belt;Oxygen Nurse Communication: Mobility status;Weight bearing status;Precautions  Activity Tolerance: Patient tolerated treatment well Patient left: in bed;with call bell/phone within reach;with family/visitor present;with nursing/sitter in room;with bed alarm set  OT Visit Diagnosis: Unsteadiness on feet (R26.81);Other abnormalities of gait and mobility (R26.89);Muscle weakness (generalized) (M62.81);Pain;History of falling (Z91.81) Pain - Right/Left: Right Pain - part of body: Arm                Time: 7017-7939 OT Time Calculation (min): 18 min Charges:  OT General Charges $OT  Visit: 1 Procedure OT Evaluation $OT Eval Moderate Complexity: 1 Procedure G-Codes: OT G-codes **NOT FOR INPATIENT CLASS** Functional Assessment Tool Used: Clinical judgement Functional Limitation: Self care Self Care Current Status (Q3009): At least 20 percent but less than 40 percent impaired, limited or restricted Self Care Goal Status (Q3300): At least 1 percent but less than 20 percent impaired, limited or restricted   [facility], OTR/L [place] 10/28/2016, 5:08 PM

## 2016-10-28 NOTE — H&P (Signed)
PREOPERATIVE H&P  Chief Complaint: r elbow pain  HPI: Andrea Wallace is a 83 y.o. female who presents for evaluation of r elbow pain. It has been present for 5 days and has been worsening. She has failed conservative measures. Pain is rated as moderate.  Past Medical History:  Diagnosis Date  . Allergy   . Anemia   . Arthritis   . Asthma    stress related per pt  . Blood transfusion   . Breast cancer [facility]) 2011   Right  . Cancer ([place])    right breast  . Complication of anesthesia    diff  waking up  . Cough   . Diabetes mellitus   . Generalized headaches   . History of kidney stones   . Hyperlipidemia   . Lung nodules   . Nasal congestion   . Neuromuscular disorder ([place])    neuropathy feet/legs/hands   Past Surgical History:  Procedure Laterality Date  . BACK SURGERY    . BREAST SURGERY  06/2007   right mastectomy  . BREAST SURGERY  2011   left  . FOOT SURGERY    . KIDNEY STONE SURGERY    . LITHOTRIPSY    . MASTECTOMY Right 2011   with Radiaiton  . REDUCTION MAMMAPLASTY Left 2012  . SHOULDER ARTHROSCOPY     bilateral  . TOTAL KNEE ARTHROPLASTY    . VISCERAL ANGIOGRAPHY N/A 08/06/2016   Procedure: Visceral Angiography;  Surgeon: Algernon Huxley, MD;  Location: [facility];  Service: Cardiovascular;  Laterality: N/A;  . VISCERAL ARTERY INTERVENTION N/A 08/06/2016   Procedure: Visceral Artery Intervention;  Surgeon: Algernon Huxley, MD;  Location: [place] [facility];  Service: Cardiovascular;  Laterality: N/A;   Social History   Social History  . Marital status: Married    Spouse name: N/A  . Number of children: N/A  . Years of education: N/A   Social History Main Topics  . Smoking status: Former Smoker    Quit date: 03/15/1984  . Smokeless tobacco: Never Used  . Alcohol use Yes     Comment: occ  . Drug use: No  . Sexual activity: Not Asked   Other Topics Concern  . None   Social History Narrative  . None   Family History  Problem Relation Age  of Onset  . Osteoporosis Mother   . Heart disease Father   . Cancer Brother   . Breast cancer Brother   . Heart disease Sister    Allergies  Allergen Reactions  . Codeine Sulfate Itching   Prior to Admission medications   Medication Sig Start Date End Date Taking? Authorizing Provider  acetaminophen (TYLENOL) 500 MG tablet Take 500 mg by mouth daily.    Yes [provider]  aspirin 81 MG tablet Take 81 mg by mouth at bedtime.    Yes [provider]  bisacodyl (DULCOLAX) 5 MG EC tablet Take 1 tablet (5 mg total) by mouth 2 (two) times daily. Patient taking differently: Take 10 mg by mouth at bedtime.  10/25/16  Yes Carmin Muskrat, MD  CRESTOR 40 MG tablet Take 40 mg by mouth at bedtime.  02/17/11  Yes [provider]  DULoxetine (CYMBALTA) 60 MG capsule Take 1 capsule (60 mg total) by mouth daily. Patient taking differently: Take 60 mg by mouth at bedtime.  06/09/16  Yes Elayne Snare, MD  gabapentin (NEURONTIN) 600 MG tablet TAKE ONE TABLET BY MOUTH THREE TIMES DAILY Patient taking  differently: TAKE 1200 mg BY MOUTH DAILY 03/16/16  Yes Elayne Snare, MD  HYDROcodone-acetaminophen [facility]) 10-325 MG tablet Take 1 tablet by mouth every 6 (six) hours as needed for moderate pain.   Yes [provider]  insulin regular (NOVOLIN R RELION) 250 units/2.68mL (100 units/mL) injection Use 25 units in the morning and 35 units at supper Patient taking differently: Inject 22-35 Units into the skin See admin instructions. Use 22 units in the morning and 30 to 35 units at supper 06/12/16  Yes Elayne Snare, MD  montelukast (SINGULAIR) 10 MG tablet Take 10 mg by mouth at bedtime.  03/02/11  Yes [provider]  NOVOLIN N RELION 100 UNIT/ML injection INJECT 40 UNITS SUBCUTANEOUSLY ONCE DAILY 09/16/16  Yes Elayne Snare, MD  propranolol (INDERAL) 40 MG tablet Take 40 mg by mouth at bedtime.   Yes [provider]  Continuous Blood Gluc Sensor (FREESTYLE LIBRE SENSOR  SYSTEM) MISC 1 Device by Does not apply route every 14 (fourteen) days. [id number] Patient not taking: Reported on 10/28/2016 06/09/16   Elayne Snare, MD  HYDROcodone-acetaminophen (NORCO/VICODIN) 5-325 MG tablet Take 1 tablet by mouth every 6 (six) hours as needed for severe pain. Patient not taking: Reported on 10/28/2016 10/25/16   Carmin Muskrat, MD  hydroxypropyl methylcellulose / hypromellose (ISOPTO TEARS / GONIOVISC) 2.5 % ophthalmic solution Place 1 drop into both eyes 3 (three) times daily as needed for dry eyes.    [provider]     Positive ROS: none  All other systems have been reviewed and were otherwise negative with the exception of those mentioned in the HPI and as above.  Physical Exam: Vitals:   10/28/16 0841  BP: (!) 164/57  Pulse: (!) 104  Resp: 17    General: Alert, no acute distress Cardiovascular: No pedal edema Respiratory: No cyanosis, no use of accessory musculature GI: No organomegaly, abdomen is soft and non-tender Skin: No lesions in the area of chief complaint Neurologic: Sensation intact distally Psychiatric: Patient is competent for consent with normal mood and affect Lymphatic: No axillary or cervical lymphadenopathy  MUSCULOSKELETAL: r arm swollen and in splint. nvi distally  XRAY: comminuted and displaced olecranon fracture  Assessment/Plan: DISPLACED RIGHT OLECRANON FRACTURE Plan for Procedure(s): OPEN REDUCTION INTERNAL FIXATION (ORIF) ELBOW/OLECRANON FRACTURE  The risks benefits and alternatives were discussed with the patient including but not limited to the risks of nonoperative treatment, versus surgical intervention including infection, bleeding, nerve injury, malunion, nonunion, hardware prominence, hardware failure, need for hardware removal, blood clots, cardiopulmonary complications, morbidity, mortality, among others, and they were willing to proceed.  Predicted outcome is good, although there will be at least a six to  nine month expected recovery.  Josefine Fuhr L, MD 10/28/2016 9:46 AM

## 2016-10-28 NOTE — Anesthesia Procedure Notes (Addendum)
Anesthesia Regional Block: Supraclavicular block   Pre-Anesthetic Checklist: ,, timeout performed, Correct Patient, Correct Site, Correct Laterality, Correct Procedure, Correct Position, site marked, Risks and benefits discussed,  Surgical consent,  Pre-op evaluation,  At surgeon's request and post-op pain management  Laterality: Right  Prep: chloraprep       Needles:   Needle Type: Echogenic Stimulator Needle          Additional Needles:   Procedures: ultrasound guided,,,,,,,,  Narrative:  Start time: 10/28/2016 9:45 AM End time: 10/28/2016 9:50 AM Injection made incrementally with aspirations every 5 mL.  Performed by: Personally  Anesthesiologist: Alcee Sipos  Additional Notes: 30 cc 0.5% Bupivacaine with 1:200 epi injected easily

## 2016-10-28 NOTE — Op Note (Signed)
NAME:  Andrea Wallace, Andrea Wallace                     ACCOUNT NO.:  MEDICAL RECORD NO.:  [id number]  LOCATION:                                 FACILITY:  PHYSICIAN:  Alta Corning, M.D.   DATE OF BIRTH:  [birth year]  DATE OF PROCEDURE:  10/28/2016 DATE OF DISCHARGE:                              OPERATIVE REPORT   PREOPERATIVE DIAGNOSIS:  Comminuted displaced right olecranon fracture.  POSTOPERATIVE DIAGNOSES: 1. Comminuted displaced right olecranon fracture. 2. Complex contusion to the skin.  PROCEDURE: 1. Excision of highly contused skin by way of a Z-plasty at the site     of an open fracture. 2. Open reduction and internal fixation of comminuted olecranon     fracture. 3. Interpretation of multiple intraoperative fluoroscopic images.  SURGEON:  Alta Corning, M.D.  Terrence DupontModena Slater.  ANESTHESIA:  General.  BRIEF HISTORY:  Andrea Wallace is an 83 year old female with a history of having falling on her right olecranon.  She was seen in the office and noted to have a severely comminuted displaced olecranon fracture.  We were concerned about the integrity of her triceps mechanism and felt that she needed open reduction and internal fixation.  We were obviously concerned about the articular issues which would be there but felt that not as critical as just getting the triceps re-attached to the olecranon.  She was brought to the operating room for this surgical intervention.  DESCRIPTION OF PROCEDURE:  The patient was brought to the operating room after adequate anesthesia obtained with IV regional.  The patient placed supine on the operating table.  The right arm was then taken out of the splint, and at that time was noted to have severely areas of contusion and abrasion and essentially full-thickness areas.  At this point, she was prepped and draped in usual sterile fashion.  Following this, we spent some time with a marking pen, needing to excise these areas of full-thickness  contused skin, and we did a Z-plasty outlined on the arm, and once this at length, we figured out where the appropriate incisions would go.  We ultimately made these incisions with Z-plasty and exposed the fracture.  The fracture was irrigated, comminuted pieces unfortunately were removed, and they were not able to be kept because there was no attachment to any tissue.  These comminuted pieces were removed which was going to leave a little bit of a gap in the articular surface which we knew was fine going in. once this was done, we did a manipulative closed reduction of the olecranon.  I felt like I needed to use a larger plate just to get the 2 tabs proximally because it was really the main portion of the surgery, was going to make it little bit prominent distally and we understood that.  We did a manipulative reduction, put the plate on, basically put a hole in the gliding, screw in the gliding hole, and then did a compression and locked the screw in place.  Checked the home-run screw, and it was well positioned below the articular surface.  Once this was done, we used fluoro to check this imaging and put  the 2 proximal screws in the proximal piece and then compressed this down with the home-run screw.  Once that was done, the distal screws were placed, 4 distal screws and 1 proximal screw in the very top tab.  Once this was done, we covered the plate with soft tissue, and then did an approximation of Z-plasty with corner tip stitches and once that was accomplished, the final skin was closed with nylon interrupted sutures.  Sterile compressive dressing was applied, and the patient was taken to the Recovery, and she was noted to be in satisfactory condition.  Multiple intraoperative fluoroscopic images were taken to ensure placement of screw, adequacy, and length.  She was placed in a well-padded posterior splint and taken to the recovery room, and she was noted to be in satisfactory  condition.  Estimated blood loss for procedure was minimal.     Alta Corning, M.D.     Corliss Skains  D:  10/28/2016  T:  10/28/2016  Job:  [id number]

## 2016-10-28 NOTE — Anesthesia Procedure Notes (Signed)
Procedure Name: MAC Date/Time: 10/28/2016 10:05 AM Performed by: Eligha Bridegroom Pre-anesthesia Checklist: Patient identified, Emergency Drugs available, Suction available, Patient being monitored and Timeout performed Patient Re-evaluated:Patient Re-evaluated prior to inductionPreoxygenation: Pre-oxygenation with 100% oxygen Intubation Type: IV induction

## 2016-10-29 ENCOUNTER — Encounter (HOSPITAL_COMMUNITY): Payer: Self-pay | Admitting: Orthopedic Surgery

## 2016-10-29 DIAGNOSIS — S52021A Displaced fracture of olecranon process without intraarticular extension of right ulna, initial encounter for closed fracture: Secondary | ICD-10-CM | POA: Diagnosis not present

## 2016-10-29 LAB — GLUCOSE, CAPILLARY
GLUCOSE-CAPILLARY: 213 mg/dL — AB (ref 65–99)
Glucose-Capillary: 373 mg/dL — ABNORMAL HIGH (ref 65–99)

## 2016-10-29 MED ORDER — HYDROCODONE-ACETAMINOPHEN 10-325 MG PO TABS: 1.0000 | ORAL_TABLET | Freq: Four times a day (QID) | ORAL | 0 refills | Status: DC | PRN

## 2016-10-29 NOTE — Care Management Note (Signed)
Case Management Note  Patient Details  Name: ITZAYANA PARDY MRN: [id number] Date of Birth: Jul 14, 1931  Subjective/Objective:  83 yr old female admitted with fracture of right ulna. Patient underwent  ORIF of right Ulna.   Action/Plan: Case manager spoke with patient and her husband concerning discharge plan and DME needs. Choice was offered for [place]. Referral was called to Christa See, [facility]. Patient says she cares for her husband, but her daughter will be assisting at discharge. DME has been ordered.   Expected Discharge Date:  10/29/16               Expected Discharge Plan:  [place]  In-House Referral:  NA  Discharge planning Services  CM Consult  Post Acute Care Choice:  Durable Medical Equipment, Home Health Choice offered to:  Patient  DME Arranged:  3-N-1, [place] rolling DME Agency:  [place]:  PT, OT, Nurse's Aide [place] Agency:  [facility]  Status of Service:  Completed, signed off  If discussed at [employer], dates discussed:    Additional Comments:  Ninfa Meeker, RN 10/29/2016, 11:40 AM

## 2016-10-29 NOTE — Progress Notes (Signed)
Subjective: 1 Day Post-Op Procedure(s) (LRB): OPEN REDUCTION INTERNAL FIXATION (ORIF) ELBOW/OLECRANON FRACTURE (Right) Patient reports pain as moderate. Use some pain medicine at home. Taking by mouth without difficulty. "I'm ready to go home "    Objective: Vital signs in last 24 hours: Temp:  [97.9 F (36.6 C)-100.2 F (37.9 C)] 100.2 F (37.9 C) (06/07 0442) Pulse Rate:  [74-104] 82 (06/07 0442) Resp:  [15-21] 18 (06/07 0442) BP: (93-175)/(46-78) 112/48 (06/07 0442) SpO2:  [90 %-100 %] 98 % (06/07 0442)  Intake/Output from previous day: 06/06 0701 - 06/07 0700 In: 403.5 [P.O.:236; I.V.:67.5; IV Piggyback:100] Out: -  Intake/Output this shift: No intake/output data recorded.   Recent Labs  10/28/16 0802  HGB 9.3*    Recent Labs  10/28/16 0802  WBC 7.1  RBC 3.44*  HCT 30.0*  PLT 233    Recent Labs  10/28/16 0802  NA 133*  K 3.7  CL 97*  CO2 23  BUN 20  CREATININE 1.06*  GLUCOSE 417*  CALCIUM 9.1   No results for input(s): LABPT, INR in the last 72 hours. Right upper extremity exam: Posterior splint is intact. Moves fingers actively. Good sensation in fingers. Good capillary refill. Moderate swelling of her thumb and other fingers.   Assessment/Plan: 1 Day Post-Op Procedure(s) (LRB): OPEN REDUCTION INTERNAL FIXATION (ORIF) ELBOW/OLECRANON FRACTURE (Right) Plan: Discharge home today after physical therapy/occupational therapy. Rx for Norco 10 mg as needed for pain. I loosened her splint around her fingers and wrist today. It was much more comfortable after this. Recheck with Dr. Berenice Primas in 2 weeks. Will need close diabetes management at home.   Clifton Kovacic G 10/29/2016, 8:19 AM

## 2016-10-29 NOTE — Progress Notes (Signed)
Occupational Therapy Treatment Patient Details Name: Andrea Wallace MRN: [id number] DOB: [birth year] Today's Date: 10/29/2016    History of present illness 83 y.o. Female admitted to ED for mechanical fall on 10/25/16 and sustained a right olecranon fracture. Underwent ORIF of elbow on 10/28/16. PMH of breast cancer, DM, kidney stones, back surgery (2009), TKA, and shoulder arthroscopy (2012).    OT comments  Pt able to perform toilet transfer and functional mobility with min guard-min assist today with use of quad cane. Pt requires min assist for LB dressing-reports husband can assist as needed upon return home. Updated d/c plan to home with [facility] for follow up to maximize independence and safety with ADL and functional mobility upon return home; pt agreeable to [facility] thearpy follow up. Will continue to follow acutely.   Follow Up Recommendations  Home health OT;Supervision/Assistance - 24 hour    Equipment Recommendations  3 in 1 bedside commode    Recommendations for Other Services PT consult    Precautions / Restrictions Precautions Precautions: Fall Restrictions Weight Bearing Restrictions: Yes RUE Weight Bearing: Non weight bearing       Mobility Bed Mobility               General bed mobility comments: Pt OOB in chair upon arrival.  Transfers Overall transfer level: Needs assistance Equipment used: Quad cane Transfers: Sit to/from Stand Sit to Stand: Min guard         General transfer comment: Min guard for safety with sit to stand from chair x1, toilet x1. Pt with good hand placement and technique    Balance Overall balance assessment: History of Falls;Needs assistance Sitting-balance support: Feet supported;No upper extremity supported Sitting balance-Leahy Scale: Good     Standing balance support: Single extremity supported;No upper extremity supported;During functional activity Standing balance-Leahy Scale: Fair                             ADL  either performed or assessed with clinical judgement   ADL Overall ADL's : Needs assistance/impaired Eating/Feeding: Set up;Sitting Eating/Feeding Details (indicate cue type and reason): pt set up with breakfast tray at end of session Grooming: Min guard;Standing;Wash/dry hands               Lower Body Dressing: Minimal assistance;Sit to/from stand Lower Body Dressing Details (indicate cue type and reason): Pt able to adjust socks in sitting, needs assist for pulling down and up underwear in standing. Pt reports husband can assist with this as needed upon return home Toilet Transfer: Minimal assistance;Ambulation;Comfort height toilet (quad cane)   Toileting- Clothing Manipulation and Hygiene: Minimal assistance;Sit to/from stand Toileting - Clothing Manipulation Details (indicate cue type and reason): set up for peri care, min assist for clothing     Functional mobility during ADLs: Minimal assistance;Cane General ADL Comments: Educated on R hand digit ROM frequently throughout day for edema control. Removed supplemental O2; SpO2 >90% throughout session, mainly in mid 90s     Vision       Perception     Praxis      Cognition Arousal/Alertness: Awake/alert Behavior During Therapy: WFL for tasks assessed/performed Overall Cognitive Status: Within Functional Limits for tasks assessed                                          Exercises  Shoulder Instructions       General Comments      Pertinent Vitals/ Pain       Pain Assessment: Faces Faces Pain Scale: Hurts even more Pain Location: R arm Pain Descriptors / Indicators: Aching;Sore Pain Intervention(s): Monitored during session;Patient requesting pain meds-RN notified;Ice applied  Home Living                                          Prior Functioning/Environment              Frequency  Min 3X/week        Progress Toward Goals  OT Goals(current goals can now be  found in the care plan section)  Progress towards OT goals: Progressing toward goals  Acute Rehab OT Goals Patient Stated Goal: go home OT Goal Formulation: With patient/family  Plan Discharge plan needs to be updated    Co-evaluation                 AM-PAC PT "6 Clicks" Daily Activity     Outcome Measure   Help from another person eating meals?: None Help from another person taking care of personal grooming?: A Little Help from another person toileting, which includes using toliet, bedpan, or urinal?: A Little Help from another person bathing (including washing, rinsing, drying)?: A Lot Help from another person to put on and taking off regular upper body clothing?: A Lot Help from another person to put on and taking off regular lower body clothing?: A Little 6 Click Score: 17    End of Session Equipment Utilized During Treatment: Gait belt;Other (comment) (quad cane, sling)  OT Visit Diagnosis: Unsteadiness on feet (R26.81);Other abnormalities of gait and mobility (R26.89);Muscle weakness (generalized) (M62.81);Pain;History of falling (Z91.81) Pain - Right/Left: Right Pain - part of body: Arm   Activity Tolerance Patient tolerated treatment well   Patient Left in chair;with call bell/phone within reach;with family/visitor present   Nurse Communication Patient requests pain meds;Other (comment);Mobility status (removed supplemental O2)        Time: 3086-5784 OT Time Calculation (min): 17 min  Charges: OT General Charges $OT Visit: 1 Procedure OT Treatments $Self Care/Home Management : 8-22 mins  Jarita Raval A. Ulice Brilliant, M.S., OTR/L Pager: [place] 10/29/2016, 8:52 AM

## 2016-10-29 NOTE — Discharge Instructions (Signed)
° °  Wear sling. Apply ice to elbow. Move fingers as tolerated.

## 2016-10-29 NOTE — Discharge Summary (Signed)
Patient ID: Andrea Wallace MRN: [id number] DOB/AGE: 1931/06/29 83 y.o.  Admit date: 10/28/2016 Discharge date: 10/29/2016  Admission Diagnoses:  Principal Problem:   Displaced fracture of olecranon process of right ulna with intra-articular extension Active Problems:   Displaced fracture of olecranon process with intraarticular extension of right ulna, initial encounter for closed fracture   Discharge Diagnoses:  Same  Past Medical History:  Diagnosis Date  . Allergy   . Anemia   . Arthritis   . Asthma    stress related per pt  . Blood transfusion   . Breast cancer [place]) 2011   Right  . Cancer ([employer])    right breast  . Complication of anesthesia    diff  waking up  . Cough   . Diabetes mellitus   . Family history of adverse reaction to anesthesia    " SISTER HAS DIFFICULTY WAKING "  . Generalized headaches   . History of kidney stones   . Hyperlipidemia   . Lung nodules   . Nasal congestion   . Neuromuscular disorder ([place])    neuropathy feet/legs/hands    Surgeries: Procedure(s):Right OPEN REDUCTION INTERNAL FIXATION (ORIF) ELBOW/OLECRANON FRACTURE on 10/28/2016   Consultants:   Discharged Condition: Improved  Hospital Course: Andrea Wallace is an 83 y.o. female who was admitted 10/28/2016 for operative treatment ofDisplaced fracture of olecranon process of right ulna with intra-articular extension. Patient has severe unremitting pain that affects sleep, daily activities, and work/hobbies. After pre-op clearance the patient was taken to the operating room on 10/28/2016 and underwent  Procedure(s):right OPEN REDUCTION INTERNAL FIXATION (ORIF) ELBOW/OLECRANON FRACTURE.    Patient was given perioperative antibiotics: Anti-infectives    Start     Dose/Rate Route Frequency Ordered Stop   10/28/16 1600  ceFAZolin (ANCEF) IVPB 1 g/50 mL premix     1 g 100 mL/hr over 30 Minutes Intravenous Every 6 hours 10/28/16 1408 10/29/16 0516   10/28/16 0815  ceFAZolin (ANCEF) IVPB 2g/100  mL premix     2 g 200 mL/hr over 30 Minutes Intravenous On call to O.R. 10/28/16 4098 10/28/16 1000       Patient was given sequential compression devices, early ambulation, and chemoprophylaxis to prevent DVT.  Patient benefited maximally from hospital stay and there were no complications.    Recent vital signs: Patient Vitals for the past 24 hrs:  BP Temp Temp src Pulse Resp SpO2  10/29/16 0442 (!) 112/48 100.2 F (37.9 C) Oral 82 18 98 %  10/29/16 0033 (!) 121/51 99 F (37.2 C) Oral 78 16 98 %  10/28/16 2056 (!) 154/54 98.2 F (36.8 C) Oral 82 17 100 %  10/28/16 1407 (!) 126/46 97.9 F (36.6 C) Oral 80 16 100 %  10/28/16 1333 (!) 123/59 98 F (36.7 C) - 82 19 100 %  10/28/16 1330 - - - 81 20 100 %  10/28/16 1318 93/78 - - 80 18 98 %  10/28/16 1315 - - - 82 18 99 %  10/28/16 1303 (!) 109/49 - - 78 15 100 %  10/28/16 1300 - - - 78 15 100 %     Recent laboratory studies:  Recent Labs  10/28/16 0802  WBC 7.1  HGB 9.3*  HCT 30.0*  PLT 233  NA 133*  K 3.7  CL 97*  CO2 23  BUN 20  CREATININE 1.06*  GLUCOSE 417*  CALCIUM 9.1     Discharge Medications:   Allergies as of 10/29/2016  Reactions   Codeine Sulfate Itching      Medication List    TAKE these medications   acetaminophen 500 MG tablet Commonly known as:  TYLENOL Take 500 mg by mouth daily.   aspirin 81 MG tablet Take 81 mg by mouth at bedtime.   bisacodyl 5 MG EC tablet Commonly known as:  DULCOLAX Take 1 tablet (5 mg total) by mouth 2 (two) times daily. What changed:  how much to take  when to take this   CRESTOR 40 MG tablet Generic drug:  rosuvastatin Take 40 mg by mouth at bedtime.   DULoxetine 60 MG capsule Commonly known as:  CYMBALTA Take 1 capsule (60 mg total) by mouth daily. What changed:  when to take this   [place] 1 Device by Does not apply route every 14 (fourteen) days. [id number]   gabapentin 600 MG tablet Commonly known as:   NEURONTIN TAKE ONE TABLET BY MOUTH THREE TIMES DAILY What changed:  See the new instructions.   HYDROcodone-acetaminophen 5-325 MG tablet Commonly known as:  NORCO/VICODIN Take 1 tablet by mouth every 6 (six) hours as needed for severe pain. What changed:  Another medication with the same name was changed. Make sure you understand how and when to take each.   HYDROcodone-acetaminophen 10-325 MG tablet Commonly known as:  NORCO Take 1-2 tablets by mouth every 6 (six) hours as needed for severe pain. What changed:  how much to take  reasons to take this   hydroxypropyl methylcellulose / hypromellose 2.5 % ophthalmic solution Commonly known as:  ISOPTO TEARS / GONIOVISC Place 1 drop into both eyes 3 (three) times daily as needed for dry eyes.   insulin regular 100 units/mL injection Commonly known as:  NOVOLIN R RELION Use 25 units in the morning and 35 units at supper What changed:  how much to take  how to take this  when to take this  additional instructions   montelukast 10 MG tablet Commonly known as:  SINGULAIR Take 10 mg by mouth at bedtime.   NOVOLIN N RELION 100 UNIT/ML injection Generic drug:  insulin NPH Human INJECT 40 UNITS SUBCUTANEOUSLY ONCE DAILY   propranolol 40 MG tablet Commonly known as:  INDERAL Take 40 mg by mouth at bedtime.            Durable Medical Equipment        Start     Ordered   10/29/16 1137  For home use only DME 3 n 1  Once     10/29/16 1137      Diagnostic Studies: Dg Lumbar Spine Complete  Result Date: 10/25/2016 CLINICAL DATA:  Status post fall, with lower back pain. Initial encounter. EXAM: LUMBAR SPINE - COMPLETE 4+ VIEW COMPARISON:  CT the abdomen and pelvis from 07/03/2016 FINDINGS: There is no evidence of fracture or subluxation. The patient is status post lumbar spinal fusion at L2-S1, with surrounding degenerative change. Vertebral bodies demonstrate normal height and alignment. The visualized bowel gas pattern  is unremarkable in appearance; air and stool are noted within the colon. The sacroiliac joints are within normal limits. Diffuse vascular calcifications are seen. A vascular stent is noted arising from the proximal abdominal aorta. IMPRESSION: 1. No evidence of fracture or subluxation along the lumbar spine. 2. Status post lumbar spinal fusion at L2-S1, with surrounding degenerative change. 3. Diffuse aortic atherosclerosis. Electronically Signed   By: Garald Balding M.D.   On: 10/25/2016 20:11   Dg Pelvis  1-2 Views  Result Date: 10/25/2016 CLINICAL DATA:  Status post fall, with concern for pelvic injury. Initial encounter. EXAM: PELVIS - 1-2 VIEW COMPARISON:  CT of the abdomen and pelvis performed 07/03/2016 FINDINGS: There is no evidence of fracture or dislocation. Lumbosacral spinal fusion hardware is partially imaged. Both femoral heads are seated normally within their respective acetabula. Mild sclerosis is noted at the right sacroiliac joint. The visualized bowel gas pattern is grossly unremarkable in appearance. IMPRESSION: No evidence of fracture or dislocation. Electronically Signed   By: Garald Balding M.D.   On: 10/25/2016 19:49   Dg Shoulder Right  Result Date: 10/25/2016 CLINICAL DATA:  Status post fall onto outstretched hand, with right shoulder pain. Initial encounter. EXAM: RIGHT SHOULDER - 2+ VIEW COMPARISON:  None. FINDINGS: There is no evidence of fracture or dislocation. There is chronic superior subluxation of the right humeral head. Mild degenerative change is noted at the right acromioclavicular joint. No significant soft tissue abnormalities are seen. The visualized portions of the right lung are clear. IMPRESSION: 1. No evidence of fracture or dislocation. 2. 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 Electronically Signed   By: Garald Balding M.D.   On: 10/25/2016 20:20   Dg Elbow 2 Views Right  Result Date: 10/25/2016 CLINICAL DATA:  Status post fall backwards, with  right elbow pain. Initial encounter. EXAM: RIGHT ELBOW - 2 VIEW COMPARISON:  None. FINDINGS: There is a comminuted fracture of the olecranon, with proximal displacement of the olecranon fragment. An associated elbow joint effusion is noted. Diffuse surrounding soft tissue swelling is noted. IMPRESSION: Comminuted fracture of the olecranon, with proximal displacement of the olecranon fragment. Associated elbow joint effusion is noted. Electronically Signed   By: Garald Balding M.D.   On: 10/25/2016 19:48   Dg Wrist Complete Right  Result Date: 10/25/2016 CLINICAL DATA:  Status post fall onto outstretched hand, with right wrist pain. Initial encounter. EXAM: RIGHT WRIST - COMPLETE 3+ VIEW COMPARISON:  None. FINDINGS: There is chronic deformity of the distal radius, and a chronically displaced ulnar styloid fracture. No definite acute fracture is seen. Soft tissue swelling is noted about the wrist. The carpal bones appear grossly intact, and demonstrate normal alignment. Mild degenerative change is noted at the first carpometacarpal joint. IMPRESSION: Chronic deformity of the distal radius, and chronically displaced ulnar styloid fracture. No acute fracture seen. Electronically Signed   By: Garald Balding M.D.   On: 10/25/2016 20:19   Dg Shoulder Left  Result Date: 10/25/2016 CLINICAL DATA:  Status post fall, with left shoulder pain. Initial encounter. EXAM: LEFT SHOULDER - 2+ VIEW COMPARISON:  None. FINDINGS: There is no evidence of fracture or dislocation. The left humeral head is seated within the glenoid fossa. Mild degenerative change is noted at the left acromioclavicular joint. No significant soft tissue abnormalities are seen. The visualized portions of the left lung are clear. IMPRESSION: No evidence of fracture or dislocation. Electronically Signed   By: Garald Balding M.D.   On: 10/25/2016 20:13   Dg Humerus Left  Result Date: 10/25/2016 CLINICAL DATA:  Status post fall onto outstretched hands, with  left arm pain. Initial encounter. EXAM: LEFT HUMERUS - 2+ VIEW COMPARISON:  None. FINDINGS: There is no evidence of fracture or dislocation. The left humerus appears intact. The elbow joint is grossly unremarkable. No elbow joint effusion is identified. A small osseous fragment volar to the elbow joint is likely degenerative in nature. The left humeral head remains seated at the  glenoid fossa. Mild degenerative change is noted at the left acromioclavicular joint. No definite soft tissue abnormalities are characterized on radiograph. IMPRESSION: No evidence of fracture or dislocation. Electronically Signed   By: Garald Balding M.D.   On: 10/25/2016 20:18    Disposition: 01-Home or Self Care  Discharge Instructions    Call MD / Call 911    Complete by:  As directed    If you experience chest pain or shortness of breath, CALL 911 and be transported to the hospital emergency room.  If you develope a fever above 101 F, pus (white drainage) or increased drainage or redness at the wound, or calf pain, call your surgeon's office.   Diet Carb Modified    Complete by:  As directed    Increase activity slowly as tolerated    Complete by:  As directed       Follow-up Information    Dorna Leitz, MD. Schedule an appointment as soon as possible for a visit in 2 week(s).   Specialty:  Orthopedic Surgery Contact information: [place] [address] [phone]        [facility] Follow up.   Specialty:  [place] Why:  A representative from [facility] will contact you to arrange start date and time for your therapy. Contact information: [address] [place] [address] [phone]            Signed: Erlene Senters 10/29/2016, 12:59 PM

## 2016-10-29 NOTE — Evaluation (Signed)
Physical Therapy Evaluation Patient Details Name: Andrea Wallace MRN: [id number] DOB: 11/10/1931 Today's Date: 10/29/2016   History of Present Illness  83 y.o. Female admitted to ED for mechanical fall on 10/25/16 and sustained a right olecranon fracture. Underwent ORIF of elbow on 10/28/16. PMH of breast cancer, DM, kidney stones, back surgery (2009), TKA, and shoulder arthroscopy (2012).   Clinical Impression  Pt admitted with above diagnosis. Pt currently with functional limitations due to the deficits listed below (see PT Problem List). Pt was able to ambulate in hallway in controlled environment with min guard assist with cane.  Husband aware that pt will need 24 hour care and to assist her on steps and with ambulation at all times initially.  HHPT,HHOT and HHaide will be needed.  Will follow acutely.  Pt will benefit from skilled PT to increase their independence and safety with mobility to allow discharge to the venue listed below.      Follow Up Recommendations Home health PT (HHOT, HHAide)    Equipment Recommendations  None recommended by PT    Recommendations for Other Services       Precautions / Restrictions Precautions Precautions: Fall Restrictions Weight Bearing Restrictions: Yes RUE Weight Bearing: Non weight bearing      Mobility  Bed Mobility               General bed mobility comments: Pt OOB in chair upon arrival.  Transfers Overall transfer level: Needs assistance Equipment used:  (hoverround cane) Transfers: Sit to/from Stand Sit to Stand: Min guard         General transfer comment: Min guard for safety with sit to stand from chair x3. Pt with good hand placement and technique  Ambulation/Gait Ambulation/Gait assistance: Min guard;Min assist Ambulation Distance (Feet): 200 Feet Assistive device:  (hoverround cane) Gait Pattern/deviations: Step-to pattern;Decreased stride length;Antalgic;Staggering right;Staggering left;Drifts right/left;Trunk  flexed;Wide base of support   Gait velocity interpretation: Below normal speed for age/gender General Gait Details: Pt was able to ambulate with hoverround cane with decr safety at times as she does not place it all the way on floor for support.  Pt never lost her balance to where she could not self correct but pt is in controlled environment and she was not challenged.  Pt c/o pain in neck as the "sling weights me down".  Pt with extreme forward head and neck.  Husband made aware that he needs to stay with pt at all times at home.  Husband is also feeble and is 33 years old and uses a cane as well.    Stairs Stairs: Yes Stairs assistance: Min guard Stair Management: One rail Left;Step to pattern;Forwards;With cane Number of Stairs: 3 General stair comments: Pt went up and down stairs with rail and did not need help.  Pt does not have a rail at home therfore used cane up and down steps and needed cues and assist for steadying but no LOB. Husband made aware to assist pt on steps at all times.  Of note, pt fell on steps due to not using cane and this is how she broke her arm.   Wheelchair Mobility    Modified Rankin (Stroke Patients Only)       Balance Overall balance assessment: History of Falls;Needs assistance Sitting-balance support: Feet supported;No upper extremity supported Sitting balance-Leahy Scale: Good Sitting balance - Comments: Maintains sitting at EOB   Standing balance support: Single extremity supported;No upper extremity supported;During functional activity Standing balance-Leahy Scale: Fair Standing balance  comment: Able to maintain static balance without physical A. However, Min A with cane for dynamic and safety                             Pertinent Vitals/Pain Pain Assessment: Faces Faces Pain Scale: Hurts even more Pain Location: R arm Pain Descriptors / Indicators: Aching;Sore Pain Intervention(s): Limited activity within patient's  tolerance;Repositioned;Monitored during session;Patient requesting pain meds-RN notified;Ice applied (ice to neck and right shoulder)    Home Living Family/patient expects to be discharged to:: Private residence Living Arrangements: Spouse/significant other Available Help at Discharge: Family;Available 24 hours/day Type of Home: House Home Access: Stairs to enter Entrance Stairs-Rails: None Entrance Stairs-Number of Steps: 2 Home Layout: One level Home Equipment: Bedside commode;Cane - single point;Cane - quad (hoverround cane)      Prior Function Level of Independence: Independent;Independent with assistive device(s)         Comments: used cane PTA at times.  Did not have cane when she fell.     Hand Dominance   Dominant Hand: Right    Extremity/Trunk Assessment   Upper Extremity Assessment Upper Extremity Assessment: Defer to OT evaluation RUE Deficits / Details: R olecranon fracture RUE Coordination: decreased fine motor;decreased gross motor    Lower Extremity Assessment Lower Extremity Assessment: LLE deficits/detail;RLE deficits/detail RLE Deficits / Details: grossly 3/5 LLE Deficits / Details: grossly 3/5    Cervical / Trunk Assessment Cervical / Trunk Assessment: Kyphotic  Communication   Communication: No difficulties  Cognition Arousal/Alertness: Awake/alert Behavior During Therapy: WFL for tasks assessed/performed Overall Cognitive Status: Within Functional Limits for tasks assessed                                        General Comments General comments (skin integrity, edema, etc.): O2 discharged prior to PT arrival.     Exercises     Assessment/Plan    PT Assessment Patient needs continued PT services  PT Problem List Decreased strength;Decreased range of motion;Decreased activity tolerance;Decreased balance;Decreased mobility;Decreased knowledge of use of DME;Decreased safety awareness;Decreased knowledge of  precautions;Pain       PT Treatment Interventions DME instruction;Gait training;Stair training;Functional mobility training;Therapeutic activities;Therapeutic exercise;Balance training;Patient/family education    PT Goals (Current goals can be found in the Care Plan section)  Acute Rehab PT Goals Patient Stated Goal: go home PT Goal Formulation: With patient Time For Goal Achievement: 11/12/16 Potential to Achieve Goals: Good    Frequency Min 6X/week   Barriers to discharge        Co-evaluation               AM-PAC PT "6 Clicks" Daily Activity  Outcome Measure Difficulty turning over in bed (including adjusting bedclothes, sheets and blankets)?: Total Difficulty moving from lying on back to sitting on the side of the bed? : Total Difficulty sitting down on and standing up from a chair with arms (e.g., wheelchair, bedside commode, etc,.)?: Total Help needed moving to and from a bed to chair (including a wheelchair)?: A Little Help needed walking in hospital room?: A Little Help needed climbing 3-5 steps with a railing? : A Lot 6 Click Score: 11    End of Session Equipment Utilized During Treatment: Gait belt;Other (comment) (sling right UE) Activity Tolerance: Patient limited by fatigue;Patient limited by pain Patient left: in chair;with call bell/phone within reach;with family/visitor  present Nurse Communication: Mobility status;Patient requests pain meds PT Visit Diagnosis: Unsteadiness on feet (R26.81);Muscle weakness (generalized) (M62.81);Pain Pain - Right/Left: Right Pain - part of body: Arm    Time: 0300-9233 PT Time Calculation (min) (ACUTE ONLY): 34 min   Charges:   PT Evaluation $PT Eval Moderate Complexity: 1 Procedure PT Treatments $Gait Training: 8-22 mins   PT G Codes:   PT G-Codes **NOT FOR INPATIENT CLASS** Functional Assessment Tool Used: AM-PAC 6 Clicks Basic Mobility Functional Limitation: Mobility: Walking and moving around Mobility:  Walking and Moving Around Current Status (A0762): At least 20 percent but less than 40 percent impaired, limited or restricted Mobility: Walking and Moving Around Goal Status [phone]): At least 1 percent but less than 20 percent impaired, limited or restricted    [place] [phone] (pager)   Denice Paradise 10/29/2016, 10:07 AM

## 2016-11-11 ENCOUNTER — Encounter: Payer: Self-pay | Admitting: Endocrinology

## 2016-11-11 ENCOUNTER — Ambulatory Visit (INDEPENDENT_AMBULATORY_CARE_PROVIDER_SITE_OTHER): Payer: Medicare Other | Admitting: Endocrinology

## 2016-11-11 VITALS — BP 130/66 | HR 100 | Ht 65.0 in | Wt 158.4 lb

## 2016-11-11 DIAGNOSIS — Z794 Long term (current) use of insulin: Secondary | ICD-10-CM | POA: Diagnosis not present

## 2016-11-11 DIAGNOSIS — E1165 Type 2 diabetes mellitus with hyperglycemia: Secondary | ICD-10-CM | POA: Diagnosis not present

## 2016-11-11 MED ORDER — HYDROCODONE-ACETAMINOPHEN 10-325 MG PO TABS: 1.0000 | ORAL_TABLET | Freq: Four times a day (QID) | ORAL | 0 refills | Status: DC | PRN

## 2016-11-11 NOTE — Progress Notes (Signed)
Patient ID: Andrea Wallace, female   DOB: 08/19/1931, 83 y.o.   MRN: [id number]   Reason for Appointment: Diabetes follow-up   History of Present Illness   Diagnosis: Type 2 DIABETES MELITUS, date of diagnosis 1999  Previous history: She has been on insulin for several years to control her diabetes and usually requires large doses Has been on basal bolus regimen for the last few years, taking Lantus twice a day.  Her blood sugars tend to fluctuate significantly but her A1c is usually at a reasonable level She does not clearly benefit from GLP-1 drugs and had been taking Byetta to help with postprandial hyperglycemia, this has been stopped  She had been tried on Victoza, Byetta and Invokana previously and these were  stopped because of unclear benefit       RECENT history:  Insulin regimen:    Novolin N 45 in a.m,  30 at bedtime  REGULAR insulin 25 at breakfast, 40  BEFORE SUPPER   Her A1c on her last visit had gone up to 8%, previously 7.3  Current management, blood sugar patterns and problems identified:  She was told to come back for short-term follow-up because of her having tendency to low sugars in the mornings and significantly high readings after her evening meal  Insulin doses were adjusted as above and NPH increased in the morning and reduced at bedtime  She forgot her blood sugar monitor and cannot remember her readings well  Her weight has gone down significantly and not clear if she has cut back on her portions since she had her elbow fracture a couple of weeks ago  HYPOGLYCEMIA: She thinks this has happened only once with the blood sugar around 50 and most likely in the morning, patient has a difficult time remembering her blood sugars  She thinks her blood sugars are not low otherwise the mornings or any other time  As before she probably checking blood sugars only fasting and bedtime  She thinks her blood sugars are averaging about 200 at bedtime and  only once over 300  Has not been adjusting her suppertime dose based on how much she is eating  Blood sugar readings by recall as above    Oral hypoglycemic drugs: Metformin ER, 500 mg 2 a day       Side effects from medications: None  Proper timing of medications in relation to meals: Yes.         Monitors blood glucose:  2-3 times a day on average.    Glucometer: Freestyle          Meals: 3 meals per day.   at breakfast she is eating a Half bagel now.  Mostly eating cheese crackers at lunch and full meal at dinner Usually avoiding all drinks with sugar          Physical activity: exercise: none, has back pain            Dietician visit: Most recent: Years ago       Complications: are: Neuropathy, microalbuminuria     Wt Readings from Last 3 Encounters:  11/11/16 158 lb 6.4 oz (71.8 kg)  10/25/16 165 lb (74.8 kg)  09/10/16 155 lb (70.3 kg)     Lab Results  Component Value Date   HGBA1C 8.0 09/10/2016   HGBA1C 7.3 06/09/2016   HGBA1C 8.3 12/24/2015   Lab Results  Component Value Date   MICROALBUR 122.6 (H) 12/24/2015   LDLCALC 32 12/05/2013  CREATININE 1.06 (H) 10/28/2016    OTHER problems discussed today: See review of systems   Allergies as of 11/11/2016      Reactions   Codeine Sulfate Itching      Medication List       Accurate as of 11/11/16  1:08 PM. Always use your most recent med list.          acetaminophen 500 MG tablet Commonly known as:  TYLENOL Take 500 mg by mouth daily.   aspirin 81 MG tablet Take 81 mg by mouth at bedtime.   bisacodyl 5 MG EC tablet Commonly known as:  DULCOLAX Take 1 tablet (5 mg total) by mouth 2 (two) times daily.   CRESTOR 40 MG tablet Generic drug:  rosuvastatin Take 40 mg by mouth at bedtime.   DULoxetine 60 MG capsule Commonly known as:  CYMBALTA Take 1 capsule (60 mg total) by mouth daily.   FREESTYLE LIBRE SENSOR SYSTEM Misc 1 Device by Does not apply route every 14 (fourteen) days. [id number]     gabapentin 600 MG tablet Commonly known as:  NEURONTIN TAKE ONE TABLET BY MOUTH THREE TIMES DAILY   HYDROcodone-acetaminophen 10-325 MG tablet Commonly known as:  NORCO Take 1-2 tablets by mouth every 6 (six) hours as needed for severe pain.   hydroxypropyl methylcellulose / hypromellose 2.5 % ophthalmic solution Commonly known as:  ISOPTO TEARS / GONIOVISC Place 1 drop into both eyes 3 (three) times daily as needed for dry eyes.   insulin regular 100 units/mL injection Commonly known as:  NOVOLIN R RELION Use 25 units in the morning and 35 units at supper   MEGARED OMEGA-3 KRILL OIL 500 MG Caps Take by mouth. Take one daily   montelukast 10 MG tablet Commonly known as:  SINGULAIR Take 10 mg by mouth at bedtime.   NOVOLIN N RELION 100 UNIT/ML injection Generic drug:  insulin NPH Human INJECT 40 UNITS SUBCUTANEOUSLY ONCE DAILY   propranolol 40 MG tablet Commonly known as:  INDERAL Take 40 mg by mouth at bedtime.       Allergies:  Allergies  Allergen Reactions  . Codeine Sulfate Itching    Past Medical History:  Diagnosis Date  . Allergy   . Anemia   . Arthritis   . Asthma    stress related per pt  . Blood transfusion   . Breast cancer [facility]) 2011   Right  . Cancer ([place])    right breast  . Complication of anesthesia    diff  waking up  . Cough   . Diabetes mellitus   . Family history of adverse reaction to anesthesia    " SISTER HAS DIFFICULTY WAKING "  . Generalized headaches   . History of kidney stones   . Hyperlipidemia   . Lung nodules   . Nasal congestion   . Neuromuscular disorder ([place])    neuropathy feet/legs/hands    Past Surgical History:  Procedure Laterality Date  . BACK SURGERY    . BREAST SURGERY  06/2007   right mastectomy  . BREAST SURGERY  2011   left  . FOOT SURGERY    . KIDNEY STONE SURGERY    . LITHOTRIPSY    . MASTECTOMY Right 2011   with Radiaiton  . ORIF ELBOW FRACTURE Right 10/28/2016   Procedure: OPEN REDUCTION  INTERNAL FIXATION (ORIF) ELBOW/OLECRANON FRACTURE;  Surgeon: Dorna Leitz, MD;  Location: [place];  Service: Orthopedics;  Laterality: Right;  . REDUCTION MAMMAPLASTY Left 2012  .  SHOULDER ARTHROSCOPY     bilateral  . TOTAL KNEE ARTHROPLASTY    . VISCERAL ANGIOGRAPHY N/A 08/06/2016   Procedure: Visceral Angiography;  Surgeon: Algernon Huxley, MD;  Location: [place] [facility];  Service: Cardiovascular;  Laterality: N/A;  . VISCERAL ARTERY INTERVENTION N/A 08/06/2016   Procedure: Visceral Artery Intervention;  Surgeon: Algernon Huxley, MD;  Location: [place] [facility];  Service: Cardiovascular;  Laterality: N/A;    Family History  Problem Relation Age of Onset  . Osteoporosis Mother   . Heart disease Father   . Cancer Brother   . Breast cancer Brother   . Heart disease Sister     Social History:  reports that she quit smoking about 32 years ago. She has never used smokeless tobacco. She reports that she drinks alcohol. She reports that she does not use drugs.  Review of Systems:   Chronic kidney disease: Creatinine has been  upper normal,  followed periodically by nephrologist Last urine microalbumin was high Blood pressure tends to be low normal   Lab Results  Component Value Date   CREATININE 1.06 (H) 10/28/2016     HYPERLIPIDEMIA: The lipid abnormality consists of elevated  triglycerides, has been treated with TriCor Also followed by PCP Last triglycerides from PCP: 254    Lab Results  Component Value Date   CHOL 141 12/24/2015   HDL 42.10 12/24/2015   LDLCALC 32 12/05/2013   LDLDIRECT 70.0 12/24/2015   TRIG 206.0 (H) 12/24/2015   CHOLHDL 3 12/24/2015     She has chronic pains and paresthesia in her feet and lower legs Also has had back pain  She is taking her Cymbalta    Also on gabapentin 600 mg  Asking for refill on her hydrocodone 10 mg  Last diabetic foot exam was in 8/17 during some sensory loss She is using comfortable soft shoes which she thinks helps  discomfort  She has had a multinodular goiter, this has been euthyroid, Last TSH normal in 2/18 from PCP  Lab Results  Component Value Date   TSH 1.14 03/20/2015        Examination:   BP 130/66   Pulse 100   Ht 5\' 5"  (1.651 m)   Wt 158 lb 6.4 oz (71.8 kg)   SpO2 95%   BMI 26.36 kg/m   Body mass index is 26.36 kg/m.     ASSESSMENT/ PLAN:   Diabetes type 2:  See history of present illness for detailed discussion of  current management, blood sugar patterns and problems identified  Her A1c is Recently 8%, previously 7.3  She is on a regimen of NPH and Regular Insulin twice a day with low-dose metformin  Her blood sugars are difficult to assess with her not bringing her meter but she does not think she has had as many low sugars in the morning and not as high at night after supper as before with insulin changes  Recommendations:  Again reduce the NPH by 5 units at bedtime  Continue other doses unchanged  More consistent monitoring at various times  NEUROPATHY:  She can take hydrocodone as needed, new prescription given for 100 tablets today  Patient Instructions   Novolin N 45 in a.m,  25 at bedtime  REGULAR insulin 25 at breakfast, 40  BEFORE SUPPER   Check blood sugars on waking up 3/7 days   Also check blood sugars about 2 hours after a meal and do this after different meals by rotation  Recommended blood sugar levels on waking up is 90-130 and about 2 hours after meal is 140-200  Please bring your blood sugar monitor to each visit, thank you       [facility] 11/11/2016, 1:08 PM      Note: This office note was prepared with Dragon voice recognition system technology. Any transcriptional errors that result from this process are unintentional.

## 2016-11-11 NOTE — Patient Instructions (Signed)
Novolin N 45 in a.m,  25 at bedtime  REGULAR insulin 25 at breakfast, 40  BEFORE SUPPER   Check blood sugars on waking up 3/7 days   Also check blood sugars about 2 hours after a meal and do this after different meals by rotation  Recommended blood sugar levels on waking up is 90-130 and about 2 hours after meal is 140-200  Please bring your blood sugar monitor to each visit, thank you

## 2016-11-17 NOTE — Progress Notes (Signed)
Subjective:    Patient ID: Andrea Wallace, female    DOB: 1931-12-15, 83 y.o.   MRN: [id number] Chief Complaint  Patient presents with  . Re-evaluation    Mesenteric follow up   Patient presents for her first post procedure follow-up. The patient is s/p a mesenteric angiogram on 08/06/2016 and underwent stent placement to the superior mesenteric and left common iliac artery. The patient has had an unremarkable post procedure course. The patient presents today without complaint. Patient states her abdominal pain has improved. The patient underwent a mesenteric artery duplex which was notable for a patent mesenteric artery stent with Doppler velocities suggesting greater than 70% stenosis of the proximal celiac and superior mesenteric arteries. Patient denies any fever, nausea or vomiting.   Review of Systems  Constitutional: Negative.   HENT: Negative.   Eyes: Negative.   Respiratory: Negative.   Cardiovascular: Negative.   Gastrointestinal: Negative.   Endocrine: Negative.   Genitourinary: Negative.   Musculoskeletal: Negative.   Skin: Negative.   Allergic/Immunologic: Negative.   Neurological: Negative.   Hematological: Negative.   Psychiatric/Behavioral: Negative.       Objective:   Physical Exam  Constitutional: She is oriented to person, place, and time. She appears well-developed and well-nourished. No distress.  HENT:  Head: Normocephalic and atraumatic.  Eyes: Conjunctivae are normal. Pupils are equal, round, and reactive to light.  Neck: Normal range of motion.  Cardiovascular: Normal rate, regular rhythm, normal heart sounds and intact distal pulses.   Pulses:      Radial pulses are 2+ on the right side, and 2+ on the left side.  Pulmonary/Chest: Effort normal and breath sounds normal.  Abdominal: Soft. Bowel sounds are normal. She exhibits no distension. There is no tenderness. There is no rebound and no guarding.  Musculoskeletal: Normal range of motion. She  exhibits no edema.  Neurological: She is alert and oriented to person, place, and time.  Skin: Skin is warm and dry. She is not diaphoretic.  Groin access site is healed  Psychiatric: She has a normal mood and affect. Her behavior is normal. Judgment and thought content normal.  Vitals reviewed.  BP (!) 86/49 (BP Location: Right Arm)   Pulse 77   Resp 16   Ht 5\' 5"  (1.651 m)   Wt 155 lb (70.3 kg)   BMI 25.79 kg/m   Past Medical History:  Diagnosis Date  . Allergy   . Anemia   . Arthritis   . Asthma    stress related per pt  . Blood transfusion   . Breast cancer [facility]) 2011   Right  . Cancer ([place])    right breast  . Complication of anesthesia    diff  waking up  . Cough   . Diabetes mellitus   . Family history of adverse reaction to anesthesia    " SISTER HAS DIFFICULTY WAKING "  . Generalized headaches   . History of kidney stones   . Hyperlipidemia   . Lung nodules   . Nasal congestion   . Neuromuscular disorder ([place])    neuropathy feet/legs/hands   Social History   Social History  . Marital status: Married    Spouse name: N/A  . Number of children: N/A  . Years of education: N/A   Occupational History  . Not on file.   Social History Main Topics  . Smoking status: Former Smoker    Quit date: 03/15/1984  . Smokeless tobacco: Never Used  . Alcohol  use Yes     Comment: occ  . Drug use: No  . Sexual activity: Not on file   Other Topics Concern  . Not on file   Social History Narrative  . No narrative on file   Past Surgical History:  Procedure Laterality Date  . BACK SURGERY    . BREAST SURGERY  06/2007   right mastectomy  . BREAST SURGERY  2011   left  . FOOT SURGERY    . KIDNEY STONE SURGERY    . LITHOTRIPSY    . MASTECTOMY Right 2011   with Radiaiton  . ORIF ELBOW FRACTURE Right 10/28/2016   Procedure: OPEN REDUCTION INTERNAL FIXATION (ORIF) ELBOW/OLECRANON FRACTURE;  Surgeon: Dorna Leitz, MD;  Location: [place];  Service: Orthopedics;   Laterality: Right;  . REDUCTION MAMMAPLASTY Left 2012  . SHOULDER ARTHROSCOPY     bilateral  . TOTAL KNEE ARTHROPLASTY    . VISCERAL ANGIOGRAPHY N/A 08/06/2016   Procedure: Visceral Angiography;  Surgeon: Algernon Huxley, MD;  Location: [place] [facility];  Service: Cardiovascular;  Laterality: N/A;  . VISCERAL ARTERY INTERVENTION N/A 08/06/2016   Procedure: Visceral Artery Intervention;  Surgeon: Algernon Huxley, MD;  Location: [place] [facility];  Service: Cardiovascular;  Laterality: N/A;   Family History  Problem Relation Age of Onset  . Osteoporosis Mother   . Heart disease Father   . Cancer Brother   . Breast cancer Brother   . Heart disease Sister    Allergies  Allergen Reactions  . Codeine Sulfate Itching      Assessment & Plan:  Patient presents for her first post procedure follow-up. The patient is s/p a mesenteric angiogram on 08/06/2016 and underwent stent placement to the superior mesenteric and left common iliac artery. The patient has had an unremarkable post procedure course. The patient presents today without complaint. Patient states her abdominal pain has improved. The patient underwent a mesenteric artery duplex which was notable for a patent mesenteric artery stent with Doppler velocities suggesting greater than 70% stenosis of the proximal celiac and superior mesenteric arteries. Patient denies any fever, nausea or vomiting.  1. Chronic mesenteric ischemia (HCC) - Stable Patient with improved abdominal pain status post intervention Patient with patent stent to the superior mesenteric artery No indication for intervention at this time Patient to follow up in 3 months with repeat mesenteric artery duplex to continue to monitor her chronic mesenteric issues Patient knows to call the office sooner if she should start to experience the return for symptoms such as abdominal pain  - VAS [place] MESENTERIC DUPLEX; Future  2. Essential hypertension, benign - stable Encouraged  good control as its slows the progression of atherosclerotic disease  3. Type 2 diabetes mellitus with complication, unspecified whether long term insulin use (HCC) - stable Encouraged good control as its slows the progression of atherosclerotic disease  4. Hyperlipidemia, unspecified hyperlipidemia type - stable Encouraged good control as its slows the progression of atherosclerotic disease  Current Outpatient Prescriptions on File Prior to Visit  Medication Sig Dispense Refill  . acetaminophen (TYLENOL) 500 MG tablet Take 500 mg by mouth daily.     Marland Kitchen aspirin 81 MG tablet Take 81 mg by mouth at bedtime.     . Continuous Blood Gluc Sensor (FREESTYLE LIBRE SENSOR SYSTEM) MISC 1 Device by Does not apply route every 14 (fourteen) days. [phone] (Patient not taking: Reported on 10/28/2016) 30 each 3  . CRESTOR 40 MG tablet Take 40 mg by  mouth at bedtime.     . DULoxetine (CYMBALTA) 60 MG capsule Take 1 capsule (60 mg total) by mouth daily. (Patient taking differently: Take 60 mg by mouth at bedtime. ) 30 capsule 2  . gabapentin (NEURONTIN) 600 MG tablet TAKE ONE TABLET BY MOUTH THREE TIMES DAILY (Patient taking differently: TAKE 1200 mg BY MOUTH DAILY) 90 tablet 5  . hydroxypropyl methylcellulose / hypromellose (ISOPTO TEARS / GONIOVISC) 2.5 % ophthalmic solution Place 1 drop into both eyes 3 (three) times daily as needed for dry eyes.    . insulin regular (NOVOLIN R RELION) 250 units/2.87mL (100 units/mL) injection Use 25 units in the morning and 35 units at supper (Patient taking differently: Inject 22-35 Units into the skin See admin instructions. Use 22 units in the morning and 30 to 35 units at supper) 20 mL 3  . montelukast (SINGULAIR) 10 MG tablet Take 10 mg by mouth at bedtime.     . propranolol (INDERAL) 40 MG tablet Take 40 mg by mouth at bedtime.     No current facility-administered medications on file prior to visit.     There are no Patient Instructions on file for this  visit. No Follow-up on file.   Exa Bomba A Anthony Roland, PA-C

## 2016-12-07 ENCOUNTER — Other Ambulatory Visit: Payer: Self-pay | Admitting: Endocrinology

## 2016-12-25 ENCOUNTER — Encounter (INDEPENDENT_AMBULATORY_CARE_PROVIDER_SITE_OTHER): Payer: Self-pay

## 2016-12-25 ENCOUNTER — Ambulatory Visit (INDEPENDENT_AMBULATORY_CARE_PROVIDER_SITE_OTHER): Payer: Medicare Other

## 2016-12-25 ENCOUNTER — Ambulatory Visit (INDEPENDENT_AMBULATORY_CARE_PROVIDER_SITE_OTHER): Payer: Medicare Other | Admitting: Vascular Surgery

## 2017-01-12 ENCOUNTER — Ambulatory Visit (INDEPENDENT_AMBULATORY_CARE_PROVIDER_SITE_OTHER): Payer: Medicare Other | Admitting: Endocrinology

## 2017-01-12 ENCOUNTER — Encounter: Payer: Self-pay | Admitting: Endocrinology

## 2017-01-12 VITALS — BP 136/78 | HR 93 | Ht 65.0 in | Wt 150.0 lb

## 2017-01-12 DIAGNOSIS — E1142 Type 2 diabetes mellitus with diabetic polyneuropathy: Secondary | ICD-10-CM

## 2017-01-12 DIAGNOSIS — E1165 Type 2 diabetes mellitus with hyperglycemia: Secondary | ICD-10-CM

## 2017-01-12 DIAGNOSIS — Z794 Long term (current) use of insulin: Secondary | ICD-10-CM

## 2017-01-12 DIAGNOSIS — R634 Abnormal weight loss: Secondary | ICD-10-CM

## 2017-01-12 LAB — POCT GLYCOSYLATED HEMOGLOBIN (HGB A1C): HEMOGLOBIN A1C: 10.2

## 2017-01-12 MED ORDER — HYDROCODONE-ACETAMINOPHEN 10-325 MG PO TABS: 1.0000 | ORAL_TABLET | Freq: Three times a day (TID) | ORAL | 0 refills | Status: DC | PRN

## 2017-01-12 NOTE — Progress Notes (Signed)
Patient ID: Andrea Wallace, female   DOB: [birth year], 83 y.o.   MRN: [id number]   Reason for Appointment: Diabetes follow-up   History of Present Illness   Diagnosis: Type 2 DIABETES MELITUS, date of diagnosis 1999  Previous history: She has been on insulin for several years to control her diabetes and usually requires large doses Has been on basal bolus regimen for the last few years, taking Lantus twice a day.  Her blood sugars tend to fluctuate significantly but her A1c is usually at a reasonable level She does not clearly benefit from GLP-1 drugs and had been taking Byetta to help with postprandial hyperglycemia, this has been stopped  She had been tried on Victoza, Byetta and Invokana previously and these were  stopped because of unclear benefit       RECENT history:  Insulin regimen:    Novolin N 40 in a.m,  40 at bedtime  REGULAR insulin 20 at breakfast, 20  BEFORE SUPPER   Her A1c has gone up to 10.2 today, generally around 7-8%  Current management, blood sugar patterns and problems identified:  She says that she has had a lot of stress with her husband illness and her elbow fracture and she has not been able to control her diabetes  She appears to be taking very arbitrary doses of insulin and not following instructions given on her visit  She is taking relatively more NPH insulin using 40 units at night instead of reducing it down to 25  Also taking only 20 units twice a day of Regular Insulin even though she is supposed to take more at suppertime  Most likely she is not taking her mealtime insulin before eating consistently since she has had readings around 500 at times at night about 3-4 weeks ago  Also has a couple of readings that are low late at night possibly from postprandial insulin doses  LOWEST blood sugars are fasting although only has had one low blood sugar of 61 last month  Again checking blood sugars mostly in the mornings and around 9-10 PM  at night  Blood sugars in the late morning and afternoons are variable but still mostly high  Readings late in the evenings in the last 3 or 4 days are relatively better around 200  She has lost a significant amount of weight again  She says that she is having difficulty operating her FreeStyle meter because of not getting a result and needs to repeat the test a few times sometimes  Blood sugar readings by monitor download  Mean values apply above for all meters except median for One Touch  PRE-MEAL Fasting Lunch Dinner Bedtime Overall  Glucose range:  61-211   130-271  142-323  39-500    Mean/median: 125    250 198    Oral hypoglycemic drugs: Metformin ER, 500 mg 2 a day       Side effects from medications: None  Proper timing of medications in relation to meals: Yes.         Monitors blood glucose:  2-3 times a day on average.    Glucometer: Freestyle          Meals: 3 meals per day.   at breakfast she is eating a Half bagel.  Mostly eating cheese crackers or yogurt at lunch and full meal at dinner Usually avoiding all drinks with sugar          Physical activity: exercise: none, has back pain  Dietician visit: Most recent: Years ago       Complications: are: Neuropathy, microalbuminuria     Wt Readings from Last 3 Encounters:  01/12/17 150 lb (68 kg)  11/11/16 158 lb 6.4 oz (71.8 kg)  10/25/16 165 lb (74.8 kg)     Lab Results  Component Value Date   HGBA1C 10.2 01/12/2017   HGBA1C 8.0 09/10/2016   HGBA1C 7.3 06/09/2016   Lab Results  Component Value Date   MICROALBUR 122.6 (H) 12/24/2015   LDLCALC 32 12/05/2013   CREATININE 1.06 (H) 10/28/2016    OTHER problems discussed today: See review of systems   Allergies as of 01/12/2017      Reactions   Codeine Sulfate Itching      Medication List       Accurate as of 01/12/17  8:45 PM. Always use your most recent med list.          acetaminophen 500 MG tablet Commonly known as:  TYLENOL Take 500  mg by mouth daily.   aspirin 81 MG tablet Take 81 mg by mouth at bedtime.   bisacodyl 5 MG EC tablet Commonly known as:  DULCOLAX Take 1 tablet (5 mg total) by mouth 2 (two) times daily.   CRESTOR 40 MG tablet Generic drug:  rosuvastatin Take 40 mg by mouth at bedtime.   DULoxetine 60 MG capsule Commonly known as:  CYMBALTA Take 1 capsule (60 mg total) by mouth daily.   FREESTYLE LIBRE SENSOR SYSTEM Misc 1 Device by Does not apply route every 14 (fourteen) days. [id number]   gabapentin 600 MG tablet Commonly known as:  NEURONTIN TAKE ONE TABLET BY MOUTH THREE TIMES DAILY   HYDROcodone-acetaminophen 10-325 MG tablet Commonly known as:  NORCO Take 1-2 tablets by mouth every 8 (eight) hours as needed for severe pain.   hydroxypropyl methylcellulose / hypromellose 2.5 % ophthalmic solution Commonly known as:  ISOPTO TEARS / GONIOVISC Place 1 drop into both eyes 3 (three) times daily as needed for dry eyes.   insulin regular 100 units/mL injection Commonly known as:  NOVOLIN R RELION Use 25 units in the morning and 35 units at supper   MEGARED OMEGA-3 KRILL OIL 500 MG Caps Take by mouth. Take one daily   montelukast 10 MG tablet Commonly known as:  SINGULAIR Take 10 mg by mouth at bedtime.   NOVOLIN N RELION 100 UNIT/ML injection Generic drug:  insulin NPH Human INJECT 40 UNITS SUBCUTANEOUSLY ONCE DAILY   propranolol 40 MG tablet Commonly known as:  INDERAL Take 40 mg by mouth at bedtime.       Allergies:  Allergies  Allergen Reactions  . Codeine Sulfate Itching    Past Medical History:  Diagnosis Date  . Allergy   . Anemia   . Arthritis   . Asthma    stress related per pt  . Blood transfusion   . Breast cancer [facility]) 2011   Right  . Cancer ([place])    right breast  . Complication of anesthesia    diff  waking up  . Cough   . Diabetes mellitus   . Family history of adverse reaction to anesthesia    " SISTER HAS DIFFICULTY WAKING "  .  Generalized headaches   . History of kidney stones   . Hyperlipidemia   . Lung nodules   . Nasal congestion   . Neuromuscular disorder ([place])    neuropathy feet/legs/hands    Past Surgical History:  Procedure Laterality Date  .  BACK SURGERY    . BREAST SURGERY  06/2007   right mastectomy  . BREAST SURGERY  2011   left  . FOOT SURGERY    . KIDNEY STONE SURGERY    . LITHOTRIPSY    . MASTECTOMY Right 2011   with Radiaiton  . ORIF ELBOW FRACTURE Right 10/28/2016   Procedure: OPEN REDUCTION INTERNAL FIXATION (ORIF) ELBOW/OLECRANON FRACTURE;  Surgeon: Dorna Leitz, MD;  Location: [place];  Service: Orthopedics;  Laterality: Right;  . REDUCTION MAMMAPLASTY Left 2012  . SHOULDER ARTHROSCOPY     bilateral  . TOTAL KNEE ARTHROPLASTY    . VISCERAL ANGIOGRAPHY N/A 08/06/2016   Procedure: Visceral Angiography;  Surgeon: Algernon Huxley, MD;  Location: [place] [facility];  Service: Cardiovascular;  Laterality: N/A;  . VISCERAL ARTERY INTERVENTION N/A 08/06/2016   Procedure: Visceral Artery Intervention;  Surgeon: Algernon Huxley, MD;  Location: [place] [facility];  Service: Cardiovascular;  Laterality: N/A;    Family History  Problem Relation Age of Onset  . Osteoporosis Mother   . Heart disease Father   . Cancer Brother   . Breast cancer Brother   . Heart disease Sister     Social History:  reports that she quit smoking about 32 years ago. She has never used smokeless tobacco. She reports that she drinks alcohol. She reports that she does not use drugs.  Review of Systems:   Chronic kidney disease: Creatinine has been  upper normal,  followed periodically by nephrologist Last urine microalbumin was high Again not on any medications or ARB drugs Lab results reviewed from PCP office  Lab Results  Component Value Date   CREATININE 1.06 (H) 10/28/2016     HYPERLIPIDEMIA: The lipid abnormality consists of elevated  triglycerides, has been treated with TriCor Also followed by PCP Last  triglycerides from PCP: 254    Lab Results  Component Value Date   CHOL 141 12/24/2015   HDL 42.10 12/24/2015   LDLCALC 32 12/05/2013   LDLDIRECT 70.0 12/24/2015   TRIG 206.0 (H) 12/24/2015   CHOLHDL 3 12/24/2015     She has chronic pains and paresthesia in her feet and lower legs Also has had Chronic back pain  She is taking her Cymbalta    Also on gabapentin 600 mg  Asking for refill on her hydrocodone 10 mg, She thinks she has taken more tablets than usual because of dealing with her elbow fracture Generally taking hydrocodone at bedtime  Last diabetic foot exam was in 8/17 during some sensory loss  She has had a multinodular goiter, this has been euthyroid, Last TSH normal in 2/18 from PCP  Lab Results  Component Value Date   TSH 1.14 03/20/2015        Examination:   BP 136/78   Pulse 93   Ht 5\' 5"  (1.651 m)   Wt 150 lb (68 kg)   SpO2 97%   BMI 24.96 kg/m   Body mass index is 24.96 kg/m.     ASSESSMENT/ PLAN:   Diabetes type 2:  See history of present illness for detailed discussion of  current management, blood sugar patterns and problems identified  Her A1c is much higher at 10.2%, otherwise has been previously around 8% With her age and difficulties with memory she is not able to keep up with her insulin regimen as before and taking arbitrary doses of insulin as well as not taking her insulin on time especially at suppertime She appears to be remembering  only simple numbers and not following instructions given on her visit summary insulin doses Also because of stress and her husband's illness she has had readings as high as 500 last month at night  Recommendations:  Given written instructions for insulin again and advised her to follow these  She does need to reduce her NPH at bedtime to avoid overnight hypoglycemia and increase morning dose at least 5 units afternoon readings are high  Suppertime dose will also be increased because of mostly high  readings after evening meal  Reminded her to take her mealtime insulin before she is eating consistently  Discussed in detail the use of the V-go pump as a possible option that may simplify her treatment and provide better control with smaller amounts of insulin  She will need a 30 unit basal to start with and she will take 10 units bolus at lunchtime and 14 at dinnertime using Regular Insulin  She will verify with the company about the insurance coverage and if approved and she can afford the pump she will start this as soon as possible after instructions from nurse educator  NEUROPATHY:  She can take hydrocodone as needed for pains in her legs and other significant discomfort including elbow pain, new prescription given for 100 tablets today  Patient Instructions    Novolin N 45 in a.m, 30 at bedtime  REGULAR insulin 20 at breakfast, 30  BEFORE SUPPER   Check blood sugars on waking up 5/7 days   Also check blood sugars about 2 hours after a meal and do this after different meals by rotation  Recommended blood sugar levels on waking up is 90-130 and about 2 hours after meal is 130-160  Please bring your blood sugar monitor to each visit, thank you    Counseling time on subjects discussed in assessment and plan sections is over 50% of today's 25 minute visit    Ulas Zuercher 01/12/2017, 8:45 PM      Note: This office note was prepared with Dragon voice recognition system technology. Any transcriptional errors that result from this process are unintentional.

## 2017-01-12 NOTE — Patient Instructions (Signed)
   Novolin N 45 in a.m, 30 at bedtime  REGULAR insulin 20 at breakfast, 30  BEFORE SUPPER   Check blood sugars on waking up 5/7 days   Also check blood sugars about 2 hours after a meal and do this after different meals by rotation  Recommended blood sugar levels on waking up is 90-130 and about 2 hours after meal is 130-160  Please bring your blood sugar monitor to each visit, thank you

## 2017-01-22 ENCOUNTER — Telehealth: Payer: Self-pay | Admitting: Endocrinology

## 2017-01-22 ENCOUNTER — Other Ambulatory Visit: Payer: Self-pay

## 2017-01-22 MED ORDER — INSULIN NPH (HUMAN) (ISOPHANE) 100 UNIT/ML ~~LOC~~ SUSP: SUBCUTANEOUS | 3 refills | Status: DC

## 2017-01-22 MED ORDER — GLUCOSE BLOOD VI STRP: ORAL_STRIP | 5 refills | Status: DC

## 2017-01-22 NOTE — Telephone Encounter (Signed)
MEDICATION: NOVOLIN N RELION 100 UNIT/ML injection                         Freestyle light test strips  PHARMACY:  [facility] [address], [place] [address] [phone] (Phone) [phone] (Fax)       IS THIS A 90 DAY SUPPLY : N  IS PATIENT OUT OF MEDICATION:  Y  IF NOT; HOW MUCH IS LEFT:   LAST APPOINTMENT DATE: 01/12/17  NEXT APPOINTMENT DATE: 02/11/17  OTHER COMMENTS:    **Let patient know to contact pharmacy at the end of the day to make sure medication is ready. **  ** Please notify patient to allow 48-72 hours to process**  **Encourage patient to contact the pharmacy for refills or they can request refills through [facility]**

## 2017-01-22 NOTE — Telephone Encounter (Signed)
Ordered

## 2017-01-26 ENCOUNTER — Encounter: Payer: Medicare Other | Attending: Endocrinology | Admitting: Nutrition

## 2017-01-26 DIAGNOSIS — Z794 Long term (current) use of insulin: Secondary | ICD-10-CM | POA: Diagnosis not present

## 2017-01-26 DIAGNOSIS — Z713 Dietary counseling and surveillance: Secondary | ICD-10-CM | POA: Diagnosis not present

## 2017-01-26 DIAGNOSIS — E1165 Type 2 diabetes mellitus with hyperglycemia: Secondary | ICD-10-CM | POA: Diagnosis present

## 2017-01-26 DIAGNOSIS — E1142 Type 2 diabetes mellitus with diabetic polyneuropathy: Secondary | ICD-10-CM

## 2017-01-27 NOTE — Patient Instructions (Signed)
Call when you hear from [facility], to see if you would like to try this for free for 6 days. Read brochure and call if questions.

## 2017-01-27 NOTE — Progress Notes (Signed)
Pt. Was here with a friend.  She was shown how the sensor works, and how to fill it.  She does not want to start/learn this before she knows how much it is going to cost her.  We fill out the insurance form and it was faxed to [facility] for verification, and she was told that she will get a call with how much it will cost her.  She will then call me, to let me know if she wants to do this.  We discussed the advantages of this device and she seemed very interested in it.  She was given a brochure on it, and told to call me if she has any questions. She did not bring her meter, and says she did not test today 'as yet".  Stressed the need for this, and the need to test ac and HS.  She could not tell me the doses of insulin she is taking, or the kinds.  Michela Pitcher it is written down at home.  We reviewed the insulins in terms of cloudy, and clear and we reviewed again the doses.  She had no final questions.

## 2017-01-28 ENCOUNTER — Other Ambulatory Visit: Payer: Self-pay

## 2017-01-28 MED ORDER — GLUCOSE BLOOD VI STRP: ORAL_STRIP | 5 refills | Status: DC

## 2017-02-03 ENCOUNTER — Other Ambulatory Visit: Payer: Self-pay | Admitting: Endocrinology

## 2017-02-05 ENCOUNTER — Ambulatory Visit (INDEPENDENT_AMBULATORY_CARE_PROVIDER_SITE_OTHER): Payer: Medicare Other | Admitting: Vascular Surgery

## 2017-02-05 ENCOUNTER — Encounter (INDEPENDENT_AMBULATORY_CARE_PROVIDER_SITE_OTHER): Payer: Medicare Other

## 2017-02-08 ENCOUNTER — Telehealth: Payer: Self-pay | Admitting: Endocrinology

## 2017-02-08 ENCOUNTER — Other Ambulatory Visit: Payer: Self-pay

## 2017-02-08 ENCOUNTER — Other Ambulatory Visit: Payer: Self-pay | Admitting: Oncology

## 2017-02-08 DIAGNOSIS — Z1231 Encounter for screening mammogram for malignant neoplasm of breast: Secondary | ICD-10-CM

## 2017-02-08 MED ORDER — INSULIN REGULAR HUMAN 100 UNIT/ML IJ SOLN: INTRAMUSCULAR | 3 refills | Status: DC

## 2017-02-08 NOTE — Telephone Encounter (Signed)
Read note below- this was sent

## 2017-02-08 NOTE — Telephone Encounter (Signed)
Pt. Called again, saying husband is at pharmacy, and no order for insulin.  Please resend.  Sent to [facility] in high priority

## 2017-02-08 NOTE — Telephone Encounter (Signed)
MEDICATION: insulin NPH Human (NOVOLIN N RELION) 100 UNIT/ML injection  PHARMACY:   [facility], [place] [phone] (Phone) [phone] (Fax)   IS THIS A 90 DAY SUPPLY : no  IS PATIENT OUT OF MEDICATION: yes  Other: Patient is not able to find her medication

## 2017-02-08 NOTE — Telephone Encounter (Signed)
This has been ordered 

## 2017-02-10 ENCOUNTER — Ambulatory Visit
Admission: RE | Admit: 2017-02-10 | Discharge: 2017-02-10 | Disposition: A | Discharge status: Home / Self Care | Payer: Medicare Other | Source: Ambulatory Visit | Attending: Oncology | Admitting: Oncology

## 2017-02-10 DIAGNOSIS — Z1231 Encounter for screening mammogram for malignant neoplasm of breast: Secondary | ICD-10-CM | POA: Diagnosis not present

## 2017-02-10 NOTE — Progress Notes (Signed)
Patient ID: Andrea Wallace, female   DOB: 26-Jul-1931, 83 y.o.   MRN: [id number]   Reason for Appointment: Diabetes follow-up   History of Present Illness   Diagnosis: Type 2 DIABETES MELITUS, date of diagnosis 1999  Previous history: She has been on insulin for several years to control her diabetes and usually requires large doses Has been on basal bolus regimen for the last few years, taking Lantus twice a day.  Her blood sugars tend to fluctuate significantly but her A1c is usually at a reasonable level She does not clearly benefit from GLP-1 drugs and had been taking Byetta to help with postprandial hyperglycemia, this has been stopped  She had been tried on Victoza, Byetta and Invokana previously and these were  stopped because of unclear benefit       RECENT history:  Insulin regimen:    Novolin N 40 in a.m,  40 at dinnertime  REGULAR insulin 20 at breakfast, 20  BEFORE SUPPER   Her A1c has gone up to 10.2 recently, generally around 7-8%  Current management, blood sugar patterns and problems identified:  She again says that she has had a lot of stress with her husband illness and her personal health problems and this is making her sugar go up  She still does not follow instructions for her insulin doses that are written out for her on each visit  Since she cannot remember well she takes the same doses of insulin at breakfast and suppertime 40 NPH and 20 regular  With this her blood sugars are tending to be LOW overnight and significantly high late in the day  Her blood sugars averaging higher than on her last visit mostly with blood sugars being high in the evenings and occasionally over 400  Also not clear if she is remembering to take her insulin BEFORE eating  She may be missing occasional insulin doses also since about 10 days ago showed blood sugars are high over 300 for nearly 24 hours  She does think that she is usually eating smaller portions and not  going off her diet or consuming drinks with sugar; not more thirsty even with her sugars  Her weight has leveled off  Monitors blood glucose:  2-3 times a day on average.    Glucometer: Freestyle          Blood sugar readings by monitor download, using FreeStyle meter  Mean values apply above for all meters except median for One Touch  PRE-MEAL Fasting Lunch Dinner PCS  Overall  Glucose range: 49-334  222  145-403  240-430    Mean/median:   265   320  244    Oral hypoglycemic drugs: Metformin ER, 500 mg 2 a day       Side effects from medications: None  Proper timing of medications in relation to meals: Yes.          Meals: 3 meals per day.   at breakfast she is eating a Half bagel.  Mostly eating cheese crackers or yogurt at lunch and full meal at dinner Usually avoiding all drinks with sugar including juices          Physical activity: exercise: none, has back pain            Dietician visit: Most recent: Years ago       Complications: are: Neuropathy, microalbuminuria     Wt Readings from Last 3 Encounters:  02/11/17 151 lb 12.8 oz (68.9 kg)  01/12/17 150 lb (68 kg)  11/11/16 158 lb 6.4 oz (71.8 kg)     Lab Results  Component Value Date   HGBA1C 10.2 01/12/2017   HGBA1C 8.0 09/10/2016   HGBA1C 7.3 06/09/2016   Lab Results  Component Value Date   MICROALBUR 122.6 (H) 12/24/2015   LDLCALC 32 12/05/2013   CREATININE 1.06 (H) 10/28/2016    OTHER problems discussed today: See review of systems   Allergies as of 02/11/2017      Reactions   Codeine Sulfate Itching      Medication List       Accurate as of 02/11/17 10:03 AM. Always use your most recent med list.          acetaminophen 500 MG tablet Commonly known as:  TYLENOL Take 500 mg by mouth daily.   aspirin 81 MG tablet Take 81 mg by mouth at bedtime.   bisacodyl 5 MG EC tablet Commonly known as:  DULCOLAX Take 1 tablet (5 mg total) by mouth 2 (two) times daily.   CRESTOR 40 MG tablet Generic  drug:  rosuvastatin Take 40 mg by mouth at bedtime.   DULoxetine 60 MG capsule Commonly known as:  CYMBALTA Take 1 capsule (60 mg total) by mouth daily.   DULoxetine 60 MG capsule Commonly known as:  CYMBALTA TAKE ONE CAPSULE BY MOUTH ONCE DAILY   FREESTYLE LIBRE SENSOR SYSTEM Misc 1 Device by Does not apply route every 14 (fourteen) days. [id number]   gabapentin 600 MG tablet Commonly known as:  NEURONTIN TAKE ONE TABLET BY MOUTH THREE TIMES DAILY   glucose blood test strip Commonly known as:  FREESTYLE LITE Use to test blood sugar 3 times daily dx code. E11.9   HYDROcodone-acetaminophen 10-325 MG tablet Commonly known as:  NORCO Take 1-2 tablets by mouth every 8 (eight) hours as needed for severe pain.   hydroxypropyl methylcellulose / hypromellose 2.5 % ophthalmic solution Commonly known as:  ISOPTO TEARS / GONIOVISC Place 1 drop into both eyes 3 (three) times daily as needed for dry eyes.   insulin NPH Human 100 UNIT/ML injection Commonly known as:  NOVOLIN N RELION INJECT 40 UNITS SUBCUTANEOUSLY ONCE DAILY   insulin regular 100 units/mL injection Commonly known as:  NOVOLIN R RELION Use 20 units in the morning and 30 units at supper   MEGARED OMEGA-3 KRILL OIL 500 MG Caps Take by mouth. Take one daily   montelukast 10 MG tablet Commonly known as:  SINGULAIR Take 10 mg by mouth at bedtime.   propranolol 40 MG tablet Commonly known as:  INDERAL Take 40 mg by mouth at bedtime.            Discharge Care Instructions        Start     Ordered   02/11/17 0000  Flu vaccine HIGH DOSE PF     02/11/17 0946      Allergies:  Allergies  Allergen Reactions  . Codeine Sulfate Itching    Past Medical History:  Diagnosis Date  . Allergy   . Anemia   . Arthritis   . Asthma    stress related per pt  . Blood transfusion   . Breast cancer [facility]) 2011   Right  . Cancer ([place])    right breast  . Complication of anesthesia    diff  waking up  . Cough    . Diabetes mellitus   . Family history of adverse reaction to anesthesia    " SISTER HAS  DIFFICULTY WAKING "  . Generalized headaches   . History of kidney stones   . Hyperlipidemia   . Lung nodules   . Nasal congestion   . Neuromuscular disorder ([place])    neuropathy feet/legs/hands    Past Surgical History:  Procedure Laterality Date  . BACK SURGERY    . BREAST SURGERY  06/2007   right mastectomy  . BREAST SURGERY  2011   left  . FOOT SURGERY    . KIDNEY STONE SURGERY    . LITHOTRIPSY    . MASTECTOMY Right 2011   with Radiaiton  . ORIF ELBOW FRACTURE Right 10/28/2016   Procedure: OPEN REDUCTION INTERNAL FIXATION (ORIF) ELBOW/OLECRANON FRACTURE;  Surgeon: Dorna Leitz, MD;  Location: [place];  Service: Orthopedics;  Laterality: Right;  . REDUCTION MAMMAPLASTY Left 2012  . SHOULDER ARTHROSCOPY     bilateral  . TOTAL KNEE ARTHROPLASTY    . VISCERAL ANGIOGRAPHY N/A 08/06/2016   Procedure: Visceral Angiography;  Surgeon: Algernon Huxley, MD;  Location: [place] CV LAB;  Service: Cardiovascular;  Laterality: N/A;  . VISCERAL ARTERY INTERVENTION N/A 08/06/2016   Procedure: Visceral Artery Intervention;  Surgeon: Algernon Huxley, MD;  Location: [place] CV LAB;  Service: Cardiovascular;  Laterality: N/A;    Family History  Problem Relation Age of Onset  . Osteoporosis Mother   . Heart disease Father   . Cancer Brother   . Breast cancer Brother   . Heart disease Sister     Social History:  reports that she quit smoking about 32 years ago. She has never used smokeless tobacco. She reports that she drinks alcohol. She reports that she does not use drugs.  Review of Systems:   Chronic kidney disease: Creatinine has been  upper normal,  followed periodically by nephrologist Last urine microalbumin was high Again not on any medications or ARB drugs No recent labs from PCP office available  Lab Results  Component Value Date   CREATININE 1.06 (H) 10/28/2016      HYPERLIPIDEMIA: The lipid abnormality consists of elevated  triglycerides, has been treated with TriCor Also followed by PCP Last triglycerides from PCP: 254    Lab Results  Component Value Date   CHOL 141 12/24/2015   HDL 42.10 12/24/2015   LDLCALC 32 12/05/2013   LDLDIRECT 70.0 12/24/2015   TRIG 206.0 (H) 12/24/2015   CHOLHDL 3 12/24/2015     She has chronic pains and paresthesia in her feet and lower legs Also has had Chronic back pain  She is taking her Cymbalta    Also on gabapentin 600 mg  Refill on hydrocodone was given in August, she also had 30 tablets from an orthopedic doctor before  Last diabetic foot exam was in 8/17 during some sensory loss  She has had a multinodular goiter, this has been euthyroid, Last TSH normal in 2/18 from PCP  Lab Results  Component Value Date   TSH 1.14 03/20/2015        Examination:   BP 110/62 (BP Location: Left Arm, Patient Position: Sitting, Cuff Size: Normal)   Temp 98.8 F (37.1 C) (Oral)   Ht 5\' 5"  (1.651 m)   Wt 151 lb 12.8 oz (68.9 kg)   BMI 25.26 kg/m   Body mass index is 25.26 kg/m.     ASSESSMENT/ PLAN:   Diabetes type 2:  See history of present illness for detailed discussion of  current management, blood sugar patterns and problems identified  She is still not  having adequate control of her blood sugars as discussed above with requiring significant amount of insulin However most of her insulin requirement appears to be during the daytime regardless of her food intake She does not follow instructions given her insulin doses, using the same dose twice a day because of difficulty remembering Also not taking her NPH in the evening at bedtime but doing it at suppertime Blood sugars are mostly over 300  in the evening before and after dinner  Recommendations:  Given written instructions for insulin in a diary  She will keep her diary where she does her insulin injections  She will write down her  insulin doses every day at the time of the day she is taking them  She does need to get significantly higher doses of insulin for her NPH in the morning and reduce appropriately at bedtime  Will also increase her suppertime dose back up to 35 instead of 20  Discussed that she cannot take the same doses of insulin both morning and evening  Also since she is requiring large doses of insulin for her meals she will not be able to adequately controlled her insulin requirement with the V-go pump  Consider follow-up with nurse educator  Microalbuminuria: Will recheck today  Patient Instructions   Novolin N 50 in a.m, 25 at bedtime  REGULAR insulin 20 at breakfast, 30  BEFORE SUPPER    Counseling time on subjects discussed in assessment and plan sections is over 50% of today's 25 minute visit    Andrea Wallace 02/11/2017, 10:03 AM      Note: This office note was prepared with Estate agent. Any transcriptional errors that result from this process are unintentional.

## 2017-02-11 ENCOUNTER — Encounter: Payer: Self-pay | Admitting: Endocrinology

## 2017-02-11 ENCOUNTER — Ambulatory Visit (INDEPENDENT_AMBULATORY_CARE_PROVIDER_SITE_OTHER): Payer: Medicare Other | Admitting: Endocrinology

## 2017-02-11 VITALS — BP 110/62 | Temp 98.8°F | Ht 65.0 in | Wt 151.8 lb

## 2017-02-11 DIAGNOSIS — Z794 Long term (current) use of insulin: Secondary | ICD-10-CM | POA: Diagnosis not present

## 2017-02-11 DIAGNOSIS — Z23 Encounter for immunization: Secondary | ICD-10-CM | POA: Diagnosis not present

## 2017-02-11 DIAGNOSIS — E1165 Type 2 diabetes mellitus with hyperglycemia: Secondary | ICD-10-CM

## 2017-02-11 NOTE — Patient Instructions (Addendum)
Novolin N 50 in a.m, 25 at bedtime  REGULAR insulin 20 at breakfast, 35  BEFORE SUPPER at least 15 min before the meal  Write down doses in diary

## 2017-02-15 ENCOUNTER — Ambulatory Visit
Admission: RE | Admit: 2017-02-15 | Discharge: 2017-02-15 | Disposition: A | Discharge status: Home / Self Care | Payer: Medicare Other | Source: Ambulatory Visit | Attending: Oncology | Admitting: Oncology

## 2017-02-15 DIAGNOSIS — R918 Other nonspecific abnormal finding of lung field: Secondary | ICD-10-CM | POA: Insufficient documentation

## 2017-02-15 DIAGNOSIS — J849 Interstitial pulmonary disease, unspecified: Secondary | ICD-10-CM | POA: Insufficient documentation

## 2017-02-15 DIAGNOSIS — R911 Solitary pulmonary nodule: Secondary | ICD-10-CM | POA: Insufficient documentation

## 2017-02-15 DIAGNOSIS — I700 Atherosclerosis of aorta: Secondary | ICD-10-CM | POA: Insufficient documentation

## 2017-02-15 LAB — POCT I-STAT CREATININE: CREATININE: 1.2 mg/dL — AB (ref 0.44–1.00)

## 2017-02-15 MED ORDER — IOPAMIDOL (ISOVUE-300) INJECTION 61%
75.0000 mL | Freq: Once | INTRAVENOUS | Status: AC | PRN
  Administered 2017-02-15: 60 mL via INTRAVENOUS

## 2017-03-23 ENCOUNTER — Encounter (INDEPENDENT_AMBULATORY_CARE_PROVIDER_SITE_OTHER): Payer: Self-pay | Admitting: Vascular Surgery

## 2017-03-23 ENCOUNTER — Ambulatory Visit (INDEPENDENT_AMBULATORY_CARE_PROVIDER_SITE_OTHER): Payer: Medicare Other

## 2017-03-23 ENCOUNTER — Ambulatory Visit (INDEPENDENT_AMBULATORY_CARE_PROVIDER_SITE_OTHER): Payer: Medicare Other | Admitting: Vascular Surgery

## 2017-03-23 VITALS — BP 111/53 | HR 60 | Resp 16 | Ht 63.0 in | Wt 149.0 lb

## 2017-03-23 DIAGNOSIS — I771 Stricture of artery: Secondary | ICD-10-CM

## 2017-03-23 DIAGNOSIS — E785 Hyperlipidemia, unspecified: Secondary | ICD-10-CM

## 2017-03-23 DIAGNOSIS — E1142 Type 2 diabetes mellitus with diabetic polyneuropathy: Secondary | ICD-10-CM | POA: Diagnosis not present

## 2017-03-23 DIAGNOSIS — I739 Peripheral vascular disease, unspecified: Secondary | ICD-10-CM | POA: Diagnosis not present

## 2017-03-23 DIAGNOSIS — K551 Chronic vascular disorders of intestine: Secondary | ICD-10-CM | POA: Insufficient documentation

## 2017-03-23 DIAGNOSIS — I10 Essential (primary) hypertension: Secondary | ICD-10-CM

## 2017-03-23 NOTE — Assessment & Plan Note (Signed)
Duplex today shows moderately elevated velocities in the celiac artery which are stable, her SMA stent has some mildly elevated velocities but remains patent.  The IMA is not well-visualized.  This is stable from her previous study several months ago. At this point, she should continue her antiplatelet and statin therapy.  I will plan to see her back in 6 months with noninvasive studies or sooner if problems develop in the interim.

## 2017-03-23 NOTE — Patient Instructions (Signed)
Chronic Mesenteric Ischemia Mesenteric ischemia is poor blood flow (circulation) in the vessels that supply blood to the stomach, intestines, and liver (mesenteric organs). Chronic mesenteric ischemia, also called mesenteric angina or intestinal angina, is a long-term (chronic) condition. It happens when an artery or vein that provides blood to the mesenteric organs gradually becomes blocked or narrow, restricting the blood supply to the organs. When the blood supply is severely restricted, the mesenteric organs cannot work properly. What are the causes? This condition is commonly caused by fatty deposits that build up in an artery (plaque), which can narrow the artery and restrict blood flow. Other causes include:  Weakened areas in blood vessel walls (aneurysms).  Conditions that cause twisting or inflammation of blood vessels, such as fibromuscular dysplasia or arteritis.  A disorder in which blood clots form in the veins (venous thrombosis).  Scarring and thickening (fibrosis) of blood vessels caused by radiation therapy.  A tear in the aorta, the body's main artery (aortic dissection).  Blood vessel problems after illegal drug use, such as use of cocaine.  Tumors in the nervous system (neurofibromatosis).  Certain autoimmune diseases, such as lupus.  What increases the risk? The following factors may make you more likely to develop this condition:  Being female.  Being over age 50, especially if you have a history of heart problems.  Smoking.  Congestive heart failure.  Irregular heartbeat (arrhythmia).  Having a history of heart attack or stroke.  Diabetes.  High cholesterol.  High blood pressure (hypertension).  Being overweight or obese.  Kidney disease (renal disease) requiring dialysis.  What are the signs or symptoms? Symptoms of this condition include:  Abdomen (abdominal) pain or cramps that develop 15-60 minutes after a meal. This pain may last for 1-3  hours. Some people may develop a fear of eating because of this symptom.  Weight loss.  Diarrhea.  Bloody stool.  Nausea.  Vomiting.  Bloating.  Abdominal pain after stress or with exercise.  How is this diagnosed? This condition is diagnosed based on:  Your medical history.  A physical exam.  Tests, such as: ? Ultrasound. ? CT scan. ? Blood tests. ? Urine tests. ? An imaging test that involves injecting a dye into your arteries to show blood flow through blood vessels (angiogram). This can help to show if there are any blockages in the vessels that lead to the intestines. ? Passing a small probe through the mouth and into the stomach to measure the output of carbon dioxide (gastric tonometry). This can help to indicate whether there is decreased blood flow to the stomach and intestines.  How is this treated? This condition may be treated with:  Dietary changes such as eating smaller, low-fat, meals more frequently.  Lifestyle changes to treat underlying conditions that contribute to the disease, such as high cholesterol and high blood pressure.  Medicines to reduce blood clotting and increase blood flow.  Surgery to remove the blockage, repair arteries or veins, and restore blood flow. This may involve: ? Angioplasty. This is surgery to widen the affected artery, reduce the blockage, and sometimes insert a small, mesh tube (stent). ? Bypass surgery. This may be done to go around (bypass) the blockage and reconnect healthy arteries or veins. ? Placing a stent in the affected area. This may be done to help keep blocked arteries open.  Follow these instructions at home: Eating and drinking  Eat a heart-healthy diet. This includes fresh fruits and vegetables, whole grains, and lean proteins   like chicken, fish, eggs, and beans.  Avoid foods that contain a lot of: ? Salt (sodium). ? Sugar. ? Saturated fat (such as red meat). ? Trans fat (such as fried foods).  Stay  hydrated. Drink enough fluid to keep your urine clear or pale yellow. Lifestyle  Stay active and get regular exercise as told by your health care provider. Aim for 150 minutes of moderate activity or 75 minutes of vigorous activity a week. Ask your health care provider what activities and forms of exercise are safe for you.  Maintain a healthy weight.  Work with your health care provider to manage your cholesterol.  Manage any other health problems you have, such as high blood pressure, diabetes, or heart rhythm problems.  Do not use any products that contain nicotine or tobacco, such as cigarettes and e-cigarettes. If you need help quitting, ask your health care provider. General instructions  Take over-the-counter and prescription medicines only as told by your health care provider.  Keep all follow-up visits as told by your health care provider. This is important. Contact a health care provider if:  Your symptoms do not improve or they return after treatment.  You have a fever. Get help right away if:  You have severe abdominal pain.  You have severe chest pain.  You have shortness of breath.  You feel weak or dizzy.  You have palpitations.  You have numbness or weakness in your face, arm, or leg.  You are confused.  You have trouble speaking or people have trouble understanding what you are saying.  You are constipated.  You have trouble urinating.  You have blood in your stool.  You have severe nausea, vomiting, or persistent diarrhea. Summary  Mesenteric ischemia is poor circulation in the vessels that supply blood to the the stomach, intestines, and liver (mesenteric organs).  This condition happens when an artery or vein that provides blood to the mesenteric organs gradually becomes blocked or narrow, restricting the blood supply to the organs.  This condition is commonly caused by fatty deposits that build up in an artery (plaque), which can narrow the  artery and restrict blood flow.  You are more likely to develop this condition if you are over age 50 and have a history of heart problems, high blood pressure, diabetes, or high cholesterol.  This condition is usually treated with medicines, dietary and lifestyle changes, and surgery to remove the blockage, repair arteries or veins, and restore blood flow. This information is not intended to replace advice given to you by your health care provider. Make sure you discuss any questions you have with your health care provider. Document Released: 12/29/2010 Document Revised: 04/25/2016 Document Reviewed: 04/25/2016 Elsevier Interactive Patient Education  2017 [employer]  

## 2017-03-23 NOTE — Progress Notes (Signed)
MRN : [id number]  Andrea Wallace is a 83 y.o. (1931-07-28) female who presents with chief complaint of  Chief Complaint  Patient presents with  . Follow-up    3 Mos. Mesenteric  .  History of Present Illness: Patient returns today in follow up of mesenteric disease.  She is status post SMA stent earlier this year.  Symptomatically, she is significantly improved after this intervention.  She does not have significant postprandial abdominal pain.  She does not have weight loss.  She says she has not really gained a lot of weight since her procedure, but she has not lost it either.  She is currently having some issues with her dentition which is limiting her eating at the time being. Duplex today shows moderately elevated velocities in the celiac artery which are stable, her SMA stent has some mildly elevated velocities but remains patent.  The IMA is not well-visualized.  This is stable from her previous study several months ago.  Current Outpatient Prescriptions  Medication Sig Dispense Refill  . acetaminophen (TYLENOL) 500 MG tablet Take 500 mg by mouth daily.     Marland Kitchen aspirin 81 MG tablet Take 81 mg by mouth at bedtime.     . bisacodyl (DULCOLAX) 5 MG EC tablet Take 1 tablet (5 mg total) by mouth 2 (two) times daily. (Patient taking differently: Take 10 mg by mouth at bedtime. ) 14 tablet 0  . Continuous Blood Gluc Sensor (FREESTYLE LIBRE SENSOR SYSTEM) MISC 1 Device by Does not apply route every 14 (fourteen) days. [id number] each 3  . CRESTOR 40 MG tablet Take 40 mg by mouth at bedtime.     . DULoxetine (CYMBALTA) 60 MG capsule Take 1 capsule (60 mg total) by mouth daily. (Patient taking differently: Take 60 mg by mouth at bedtime. ) 30 capsule 2  . DULoxetine (CYMBALTA) 60 MG capsule TAKE ONE CAPSULE BY MOUTH ONCE DAILY 30 capsule 2  . gabapentin (NEURONTIN) 600 MG tablet TAKE ONE TABLET BY MOUTH THREE TIMES DAILY 90 tablet 5  . glucose blood (FREESTYLE LITE) test strip Use to test  blood sugar 3 times daily dx code. E11.9 100 each 5  . HYDROcodone-acetaminophen (NORCO) 10-325 MG tablet Take 1-2 tablets by mouth every 8 (eight) hours as needed for severe pain. 100 tablet 0  . hydroxypropyl methylcellulose / hypromellose (ISOPTO TEARS / GONIOVISC) 2.5 % ophthalmic solution Place 1 drop into both eyes 3 (three) times daily as needed for dry eyes.    . insulin NPH Human (NOVOLIN N RELION) 100 UNIT/ML injection INJECT 40 UNITS SUBCUTANEOUSLY ONCE DAILY 10 mL 3  . insulin regular (NOVOLIN R RELION) 100 units/mL injection Use 20 units in the morning and 30 units at supper 20 mL 3  . MEGARED OMEGA-3 KRILL OIL 500 MG CAPS Take by mouth. Take one daily    . montelukast (SINGULAIR) 10 MG tablet Take 10 mg by mouth at bedtime.     . propranolol (INDERAL) 40 MG tablet Take 40 mg by mouth at bedtime.     No current facility-administered medications for this visit.     Past Medical History:  Diagnosis Date  . Allergy   . Anemia   . Arthritis   . Asthma    stress related per pt  . Blood transfusion   . Breast cancer [facility]) 2011   Right  . Cancer ([place])    right breast  . Complication of anesthesia    diff  waking up  .  Cough   . Diabetes mellitus   . Family history of adverse reaction to anesthesia    " SISTER HAS DIFFICULTY WAKING "  . Generalized headaches   . History of kidney stones   . Hyperlipidemia   . Lung nodules   . Nasal congestion   . Neuromuscular disorder ([place])    neuropathy feet/legs/hands    Past Surgical History:  Procedure Laterality Date  . BACK SURGERY    . BREAST SURGERY  06/2007   right mastectomy  . BREAST SURGERY  2011   left  . FOOT SURGERY    . KIDNEY STONE SURGERY    . LITHOTRIPSY    . MASTECTOMY Right 2011   with Radiaiton  . ORIF ELBOW FRACTURE Right 10/28/2016   Procedure: OPEN REDUCTION INTERNAL FIXATION (ORIF) ELBOW/OLECRANON FRACTURE;  Surgeon: Dorna Leitz, MD;  Location: [place];  Service: Orthopedics;  Laterality: Right;  .  REDUCTION MAMMAPLASTY Left 2012  . SHOULDER ARTHROSCOPY     bilateral  . TOTAL KNEE ARTHROPLASTY    . VISCERAL ANGIOGRAPHY N/A 08/06/2016   Procedure: Visceral Angiography;  Surgeon: Algernon Huxley, MD;  Location: [place] CV LAB;  Service: Cardiovascular;  Laterality: N/A;  . VISCERAL ARTERY INTERVENTION N/A 08/06/2016   Procedure: Visceral Artery Intervention;  Surgeon: Algernon Huxley, MD;  Location: [place] CV LAB;  Service: Cardiovascular;  Laterality: N/A;           Family History  Problem Relation Age of Onset  . Osteoporosis Mother   . Heart disease Father   . Cancer Brother   . Breast cancer Brother   . Heart disease Sister     Social History        Social History  Substance Use Topics  . Smoking status: Former Smoker    Quit date: 03/15/1984  . Smokeless tobacco: Never Used  . Alcohol use Yes      Comment: occ  No IVDU      Allergies  Allergen Reactions  . Codeine Sulfate Other (See Comments)  . Etodolac Other (See Comments)  . Oxycodone-Acetaminophen Other (See Comments)  . Sulfamethoxazole-Trimethoprim Other (See Comments)       REVIEW OF SYSTEMS (Negative unless checked)  Constitutional: [] Weight loss  [] Fever  [] Chills Cardiac: [] Chest pain   [] Chest pressure   [] Palpitations   [] Shortness of breath when laying flat   [] Shortness of breath at rest   [] Shortness of breath with exertion. Vascular:  [x] Pain in legs with walking   [] Pain in legs at rest   [] Pain in legs when laying flat   [] Claudication   [x] Pain in feet when walking  [] Pain in feet at rest  [] Pain in feet when laying flat   [] History of DVT   [] Phlebitis   [] Swelling in legs   [] Varicose veins   [] Non-healing ulcers Pulmonary:   [] Uses home oxygen   [] Productive cough   [] Hemoptysis   [] Wheeze  [] COPD   [] Asthma Neurologic:  [] Dizziness  [] Blackouts   [] Seizures   [] History of stroke   [] History of TIA  [] Aphasia   [] Temporary blindness   [] Dysphagia   [x] Weakness or  numbness in arms   [x] Weakness or numbness in legs Musculoskeletal:  [x] Arthritis   [] Joint swelling   [] Joint pain   [] Low back pain Hematologic:  [] Easy bruising  [] Easy bleeding   [] Hypercoagulable state   [] Anemic  [] Hepatitis Gastrointestinal:  [] Blood in stool   [] Vomiting blood  [x] Gastroesophageal reflux/heartburn   [x] Abdominal pain Genitourinary:  []   Chronic kidney disease   [] Difficult urination  [] Frequent urination  [] Burning with urination   [] Hematuria Skin:  [] Rashes   [] Ulcers   [] Wounds Psychological:  [] History of anxiety   []  History of major depression.    Physical Examination  BP (!) 111/53 (BP Location: Left Wrist)   Pulse 60   Resp 16   Ht 5\' 3"  (1.6 m)   Wt 67.6 kg (149 lb)   BMI 26.39 kg/m  Gen:  WD/WN, NAD Head: /AT, No temporalis wasting. Ear/Nose/Throat: Hearing grossly intact, nares w/o erythema or drainage, trachea midline Eyes: Conjunctiva clear. Sclera non-icteric Neck: Supple.  No JVD.  Pulmonary:  Good air movement, no use of accessory muscles.  Cardiac: RRR, normal S1, S2 Vascular:  Vessel Right Left  Radial Palpable Palpable                                   Gastrointestinal: soft, non-tender/non-distended.  Musculoskeletal: Walks with a cane. No LE swelling Neurologic: Sensation grossly intact in extremities.  Symmetrical.  Speech is fluent.  Psychiatric: Judgment intact, Mood & affect appropriate for pt's clinical situation. Dermatologic: No rashes or ulcers noted.  No cellulitis or open wounds.       Labs Recent Results (from the past 2160 hour(s))  POCT glycosylated hemoglobin (Hb A1C)     Status: None   Collection Time: 01/12/17  1:39 PM  Result Value Ref Range   Hemoglobin A1C 10.2   I-STAT creatinine     Status: Abnormal   Collection Time: 02/15/17 11:42 AM  Result Value Ref Range   Creatinine, Ser 1.20 (H) 0.44 - 1.00 mg/dL    Radiology No results found.    Assessment/Plan Type II diabetes mellitus,  uncontrolled blood glucose control important in reducing the progression of atherosclerotic disease. Also, involved in wound healing. On appropriate medications.   Essential hypertension, benign blood pressure control important in reducing the progression of atherosclerotic disease. On appropriate oral medications.   Hyperlipidemia lipid control important in reducing the progression of atherosclerotic disease. Continue statin therapy  PAD (peripheral artery disease) ([place]) Had to have left iliac intervention as part of her SMA stent.  Does say her legs feel much better after the procedure.  Check ABIs on her next visit.  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  The IMA is not well-visualized.  This is stable from her previous study several months ago. At this point, she should continue her antiplatelet and statin therapy.  I will plan to see her back in 6 months with noninvasive studies or sooner if problems develop in the interim.    Leotis Pain, MD  03/23/2017 12:22 PM    This note was created with Dragon medical transcription system.  Any errors from dictation are purely unintentional

## 2017-03-23 NOTE — Assessment & Plan Note (Signed)
Had to have left iliac intervention as part of her SMA stent.  Does say her legs feel much better after the procedure.  Check ABIs on her next visit.

## 2017-03-30 ENCOUNTER — Ambulatory Visit (INDEPENDENT_AMBULATORY_CARE_PROVIDER_SITE_OTHER): Payer: Medicare Other | Admitting: Endocrinology

## 2017-03-30 ENCOUNTER — Encounter: Payer: Self-pay | Admitting: Endocrinology

## 2017-03-30 VITALS — BP 124/76 | HR 83 | Ht 63.0 in | Wt 149.4 lb

## 2017-03-30 DIAGNOSIS — E042 Nontoxic multinodular goiter: Secondary | ICD-10-CM | POA: Diagnosis not present

## 2017-03-30 DIAGNOSIS — E782 Mixed hyperlipidemia: Secondary | ICD-10-CM | POA: Diagnosis not present

## 2017-03-30 DIAGNOSIS — Z794 Long term (current) use of insulin: Secondary | ICD-10-CM

## 2017-03-30 DIAGNOSIS — E1165 Type 2 diabetes mellitus with hyperglycemia: Secondary | ICD-10-CM | POA: Diagnosis not present

## 2017-03-30 LAB — HEMOGLOBIN A1C: HEMOGLOBIN A1C: 9.7 % — AB (ref 4.6–6.5)

## 2017-03-30 LAB — URINALYSIS, ROUTINE W REFLEX MICROSCOPIC
BILIRUBIN URINE: NEGATIVE
HGB URINE DIPSTICK: NEGATIVE
Ketones, ur: NEGATIVE
LEUKOCYTES UA: NEGATIVE
NITRITE: NEGATIVE
RBC / HPF: NONE SEEN (ref 0–?)
Specific Gravity, Urine: 1.025 (ref 1.000–1.030)
Total Protein, Urine: >=300 — AB
Urine Glucose: 100 — AB
Urobilinogen, UA: 0.2 (ref 0.0–1.0)
pH: 5.5 (ref 5.0–8.0)

## 2017-03-30 LAB — LIPID PANEL
Cholesterol: 212 mg/dL — ABNORMAL HIGH (ref 0–200)
HDL: 35.5 mg/dL — AB (ref >39.00)
NonHDL: 176.41
TRIGLYCERIDES: 349 mg/dL — AB (ref 0.0–149.0)
Total CHOL/HDL Ratio: 6
VLDL: 69.8 mg/dL — AB (ref 0.0–40.0)

## 2017-03-30 LAB — MICROALBUMIN / CREATININE URINE RATIO
CREATININE, U: 78.7 mg/dL
Microalb Creat Ratio: 120 mg/g — ABNORMAL HIGH (ref 0.0–30.0)
Microalb, Ur: 94.4 mg/dL — ABNORMAL HIGH (ref 0.0–1.9)

## 2017-03-30 LAB — COMPREHENSIVE METABOLIC PANEL
ALBUMIN: 3.7 g/dL (ref 3.5–5.2)
ALK PHOS: 87 U/L (ref 39–117)
ALT: 12 U/L (ref 0–35)
AST: 14 U/L (ref 0–37)
BUN: 34 mg/dL — AB (ref 6–23)
CALCIUM: 9.7 mg/dL (ref 8.4–10.5)
CO2: 25 mEq/L (ref 19–32)
Chloride: 104 mEq/L (ref 96–112)
Creatinine, Ser: 1.26 mg/dL — ABNORMAL HIGH (ref 0.40–1.20)
GFR: 43.08 mL/min — ABNORMAL LOW (ref >60.00)
Glucose, Bld: 187 mg/dL — ABNORMAL HIGH (ref 70–99)
POTASSIUM: 4 meq/L (ref 3.5–5.1)
SODIUM: 137 meq/L (ref 135–145)
TOTAL PROTEIN: 7 g/dL (ref 6.0–8.3)
Total Bilirubin: 0.3 mg/dL (ref 0.2–1.2)

## 2017-03-30 LAB — TSH: TSH: 1.06 u[IU]/mL (ref 0.35–4.50)

## 2017-03-30 LAB — LDL CHOLESTEROL, DIRECT: Direct LDL: 116 mg/dL

## 2017-03-30 MED ORDER — HYDROCODONE-ACETAMINOPHEN 10-325 MG PO TABS: 1.0000 | ORAL_TABLET | Freq: Three times a day (TID) | ORAL | 0 refills | Status: DC | PRN

## 2017-03-30 NOTE — Patient Instructions (Addendum)
  Novolin N 55 in a.m, 20 at bedtime  REGULAR insulin 20 at breakfast, 30  BEFORE SUPPER   Must check sugar at bedtime also  Check blood sugars on waking up  5/7  Also check blood sugars about 2 hours after a meal and do this after different meals by rotation  Recommended blood sugar levels on waking up is 90-130 and about 2 hours after meal is 130-180  Please bring your blood sugar monitor to each visit, thank you

## 2017-03-30 NOTE — Progress Notes (Signed)
Patient ID: Andrea Wallace, female   DOB: 07-Feb-1932, 83 y.o.   MRN: [id number]   Reason for Appointment: Diabetes follow-up   History of Present Illness   Diagnosis: Type 2 DIABETES MELITUS, date of diagnosis 1999  Previous history: She has been on insulin for several years to control her diabetes and usually requires large doses Has been on basal bolus regimen for the last few years, taking Lantus twice a day.  Her blood sugars tend to fluctuate significantly but her A1c is usually at a reasonable level She does not clearly benefit from GLP-1 drugs and had been taking Byetta to help with postprandial hyperglycemia, this has been stopped  She had been tried on Victoza, Byetta and Invokana previously and these were  stopped because of unclear benefit       RECENT history:  Insulin regimen:    Novolin N 50 in a.m, 25 at bedtime REGULAR insulin 20 at breakfast, 30  BEFORE SUPPER    Her A1c has been 10.2 recently, generally around 7-8%  Current management, blood sugar patterns and problems identified:  She appears to have the same pattern of blood sugars before despite adjusting her insulin  Difficult to know why she has fluctuation in her blood sugars especially in the mornings even though she thinks she is trying to take her insulin regularly most of the time  She was given a diary to keep her blood sugar records and her insulin doses but not clear if she is doing this and has not brought this today  More recently her FASTING blood sugars have been closer to normal and not as consistently high; however it was 31 about 3 days ago and 321 the next day in the morning  Blood sugars are fairly consistently high in the evenings with only a couple of readings below 200  Difficult to know whether she is checking her sugar before or after eating in the evening but appears to be checking it either right before, during our soon after her meals around 6-8 PM  No readings at  bedtime  HYPOGLYCEMIA has been documented only once  Again blood sugars are higher than expected at suppertime despite her not eating much at lunchtime  Her diet has been about the same as also her weight  She will has been on metformin before and appears to be not taking it now  Monitors blood glucose:  2-3 times a day on average.    Glucometer: Freestyle          Blood sugar readings by monitor download, using FreeStyle meter  Mean values apply above for all meters except median for One Touch  PRE-MEAL Fasting Lunch Dinner Bedtime Overall  Glucose range: 31-366 ?  256  129-424    Mean/median: 200   320   238    Oral hypoglycemic drugs: Metformin ER, 500 mg 2 a day       Side effects from medications: None  Proper timing of medications in relation to meals: Yes.          Meals: 3 meals per day.   at breakfast she is eating a Half bagel.  Mostly eating cheese crackers or yogurt at lunch and full meal at dinner Usually avoiding all drinks with sugar including juices          Physical activity: exercise: none, has back pain            Dietician visit: Most recent: Years ago  Complications: are: Neuropathy, microalbuminuria     Wt Readings from Last 3 Encounters:  03/30/17 149 lb 6.4 oz (67.8 kg)  03/23/17 149 lb (67.6 kg)  02/11/17 151 lb 12.8 oz (68.9 kg)     Lab Results  Component Value Date   HGBA1C 10.2 01/12/2017   HGBA1C 8.0 09/10/2016   HGBA1C 7.3 06/09/2016   Lab Results  Component Value Date   MICROALBUR 122.6 (H) 12/24/2015   LDLCALC 32 12/05/2013   CREATININE 1.20 (H) 02/15/2017    OTHER problems discussed today: See review of systems   Allergies as of 03/30/2017      Reactions   Codeine Sulfate Itching      Medication List        Accurate as of 03/30/17 12:14 PM. Always use your most recent med list.          acetaminophen 500 MG tablet Commonly known as:  TYLENOL Take 500 mg by mouth daily.   aspirin 81 MG tablet Take 81 mg by  mouth at bedtime.   bisacodyl 5 MG EC tablet Commonly known as:  DULCOLAX Take 1 tablet (5 mg total) by mouth 2 (two) times daily.   CRESTOR 40 MG tablet Generic drug:  rosuvastatin Take 40 mg by mouth at bedtime.   DULoxetine 60 MG capsule Commonly known as:  CYMBALTA TAKE ONE CAPSULE BY MOUTH ONCE DAILY   FREESTYLE LIBRE SENSOR SYSTEM Misc 1 Device by Does not apply route every 14 (fourteen) days. [id number]   gabapentin 600 MG tablet Commonly known as:  NEURONTIN TAKE ONE TABLET BY MOUTH THREE TIMES DAILY   glucose blood test strip Commonly known as:  FREESTYLE LITE Use to test blood sugar 3 times daily dx code. E11.9   HYDROcodone-acetaminophen 10-325 MG tablet Commonly known as:  NORCO Take 1 tablet 3 (three) times daily as needed by mouth for severe pain.   hydroxypropyl methylcellulose / hypromellose 2.5 % ophthalmic solution Commonly known as:  ISOPTO TEARS / GONIOVISC Place 1 drop into both eyes 3 (three) times daily as needed for dry eyes.   insulin NPH Human 100 UNIT/ML injection Commonly known as:  NOVOLIN N RELION INJECT 40 UNITS SUBCUTANEOUSLY ONCE DAILY   insulin regular 100 units/mL injection Commonly known as:  NOVOLIN R RELION Use 20 units in the morning and 30 units at supper   MEGARED OMEGA-3 KRILL OIL 500 MG Caps Take by mouth. Take one daily   mirtazapine 7.5 MG tablet Commonly known as:  REMERON Take 7.5 mg by mouth.   montelukast 10 MG tablet Commonly known as:  SINGULAIR Take 10 mg by mouth at bedtime.   pantoprazole 40 MG tablet Commonly known as:  PROTONIX Take by mouth.   propranolol 40 MG tablet Commonly known as:  INDERAL Take 40 mg by mouth at bedtime.       Allergies:  Allergies  Allergen Reactions  . Codeine Sulfate Itching    Past Medical History:  Diagnosis Date  . Allergy   . Anemia   . Arthritis   . Asthma    stress related per pt  . Blood transfusion   . Breast cancer [facility]) 2011   Right  . Cancer  ([place])    right breast  . Complication of anesthesia    diff  waking up  . Cough   . Diabetes mellitus   . Family history of adverse reaction to anesthesia    " SISTER HAS DIFFICULTY WAKING "  . Generalized headaches   .  History of kidney stones   . Hyperlipidemia   . Lung nodules   . Nasal congestion   . Neuromuscular disorder ([place])    neuropathy feet/legs/hands    Past Surgical History:  Procedure Laterality Date  . BACK SURGERY    . BREAST SURGERY  06/2007   right mastectomy  . BREAST SURGERY  2011   left  . FOOT SURGERY    . KIDNEY STONE SURGERY    . LITHOTRIPSY    . MASTECTOMY Right 2011   with Radiaiton  . REDUCTION MAMMAPLASTY Left 2012  . SHOULDER ARTHROSCOPY     bilateral  . TOTAL KNEE ARTHROPLASTY      Family History  Problem Relation Age of Onset  . Osteoporosis Mother   . Heart disease Father   . Cancer Brother   . Breast cancer Brother   . Heart disease Sister     Social History:  reports that she quit smoking about 33 years ago. she has never used smokeless tobacco. She reports that she drinks alcohol. She reports that she does not use drugs.  Review of Systems:   Chronic kidney disease: Creatinine has been  upper normal,  followed periodically by nephrologist Last urine microalbumin was high, needs follow-up  Again not on any medications or ARB drugs   Lab Results  Component Value Date   CREATININE 1.20 (H) 02/15/2017     HYPERLIPIDEMIA: The lipid abnormality consists of elevated  triglycerides, has been treated with TriCor Also followed by PCP Last triglycerides from PCP: 254, not checked recently    Lab Results  Component Value Date   CHOL 141 12/24/2015   HDL 42.10 12/24/2015   LDLCALC 32 12/05/2013   LDLDIRECT 70.0 12/24/2015   TRIG 206.0 (H) 12/24/2015   CHOLHDL 3 12/24/2015     She has chronic pains and paresthesia in her feet and lower legs Also has had Chronic back pain  She is taking her Cymbalta    Also on  gabapentin 600 mg   Refill on hydrocodone was given in August, this was given again She says that she mostly takes this at bedtime and occasionally when she is going out and being active during the day  Last diabetic foot exam was in 8/17 during some sensory loss  She has had a multinodular goiter, this has been euthyroid, Last TSH normal in 2/18 from PCP  Lab Results  Component Value Date   TSH 1.14 03/20/2015        Examination:   BP 124/76   Pulse 83   Ht 5\' 3"  (1.6 m)   Wt 149 lb 6.4 oz (67.8 kg)   SpO2 98%   BMI 26.47 kg/m   Body mass index is 26.47 kg/m.     ASSESSMENT/ PLAN:   Diabetes type 2:  See history of present illness for detailed discussion of  current management, blood sugar patterns and problems identified  Her blood sugars are about the same despite increasing her insulin in the morning and reducing the bedtime dose She usually prefers to have Generic or inexpensive medications and although she may do better with U-500 insulin she probably cannot afford this  She has unusually high readings around suppertime, some of these may be right after eating also This is despite her being fairly compliant with the morning injection and increasing her NPH by 10 units on the last visit Her diet has been fairly good and she is not getting excessive carbohydrate or drinks with sugar  Recommendations:  Will increase her NPH by 5 units in the morning and reduce it by 5 at bedtime to reduce potential for hypoglycemia overnight again  Check A1c today  Balanced meals at all times  Given written instructions for insulin   Reminded her to keep a diary of her insulin doses  Also reminded her to take her insulin at least 15 minutes before her plan meals  Instead of checking blood sugar of right after eating she will need to check it at bedtime at night  Consider consultation with dietitian  Microalbuminuria: Will recheck today  Goiter: Recheck thyroid  levels  100 tablets of hydrocodone given as requested for pain, she can try and take only a half a tablet during the daytime when she is planning to be active  Patient Instructions   Novolin N 55 in a.m, 20 at bedtime  REGULAR insulin 20 at breakfast, 30  BEFORE SUPPER   Must check sugar at bedtime also  Check blood sugars on waking up  5/7  Also check blood sugars about 2 hours after a meal and do this after different meals by rotation  Recommended blood sugar levels on waking up is 90-130 and about 2 hours after meal is 130-180  Please bring your blood sugar monitor to each visit, thank you    Counseling time on subjects discussed in assessment and plan sections is over 50% of today's 25 minute visit    Andrea Wallace 03/30/2017, 12:14 PM      Note: This office note was prepared with Estate agent. Any transcriptional errors that result from this process are unintentional.

## 2017-05-15 ENCOUNTER — Other Ambulatory Visit: Payer: Self-pay | Admitting: Endocrinology

## 2017-06-01 ENCOUNTER — Ambulatory Visit (INDEPENDENT_AMBULATORY_CARE_PROVIDER_SITE_OTHER): Payer: Medicare Other | Admitting: Endocrinology

## 2017-06-01 ENCOUNTER — Encounter: Payer: Self-pay | Admitting: Endocrinology

## 2017-06-01 VITALS — BP 124/70 | HR 85 | Ht 63.0 in | Wt 154.0 lb

## 2017-06-01 DIAGNOSIS — M792 Neuralgia and neuritis, unspecified: Secondary | ICD-10-CM | POA: Diagnosis not present

## 2017-06-01 DIAGNOSIS — Z794 Long term (current) use of insulin: Secondary | ICD-10-CM | POA: Diagnosis not present

## 2017-06-01 DIAGNOSIS — E1165 Type 2 diabetes mellitus with hyperglycemia: Secondary | ICD-10-CM | POA: Diagnosis not present

## 2017-06-01 DIAGNOSIS — D539 Nutritional anemia, unspecified: Secondary | ICD-10-CM

## 2017-06-01 LAB — COMPREHENSIVE METABOLIC PANEL
ALK PHOS: 97 U/L (ref 39–117)
ALT: 14 U/L (ref 0–35)
AST: 18 U/L (ref 0–37)
Albumin: 3.5 g/dL (ref 3.5–5.2)
BILIRUBIN TOTAL: 0.3 mg/dL (ref 0.2–1.2)
BUN: 28 mg/dL — ABNORMAL HIGH (ref 6–23)
CO2: 24 mEq/L (ref 19–32)
CREATININE: 1.07 mg/dL (ref 0.40–1.20)
Calcium: 8.6 mg/dL (ref 8.4–10.5)
Chloride: 100 mEq/L (ref 96–112)
GFR: 52.01 mL/min — AB (ref >60.00)
Glucose, Bld: 424 mg/dL — ABNORMAL HIGH (ref 70–99)
Potassium: 4.7 mEq/L (ref 3.5–5.1)
Sodium: 133 mEq/L — ABNORMAL LOW (ref 135–145)
TOTAL PROTEIN: 6.5 g/dL (ref 6.0–8.3)

## 2017-06-01 LAB — CBC WITH DIFFERENTIAL/PLATELET
BASOS ABS: 0.1 10*3/uL (ref 0.0–0.1)
Basophils Relative: 1.1 % (ref 0.0–3.0)
Eosinophils Absolute: 0.3 10*3/uL (ref 0.0–0.7)
Eosinophils Relative: 3.3 % (ref 0.0–5.0)
HCT: 24.5 % — ABNORMAL LOW (ref 36.0–46.0)
LYMPHS PCT: 12.5 % (ref 12.0–46.0)
Lymphs Abs: 1.2 10*3/uL (ref 0.7–4.0)
MCHC: 29.9 g/dL — ABNORMAL LOW (ref 30.0–36.0)
MCV: 78.3 fl (ref 78.0–100.0)
MONOS PCT: 6 % (ref 3.0–12.0)
Monocytes Absolute: 0.6 10*3/uL (ref 0.1–1.0)
NEUTROS ABS: 7.2 10*3/uL (ref 1.4–7.7)
Neutrophils Relative %: 77.1 % — ABNORMAL HIGH (ref 43.0–77.0)
Platelets: 308 10*3/uL (ref 150.0–400.0)
RBC: 3.13 Mil/uL — ABNORMAL LOW (ref 3.87–5.11)
RDW: 19.4 % — ABNORMAL HIGH (ref 11.5–15.5)
WBC: 9.3 10*3/uL (ref 4.0–10.5)

## 2017-06-01 LAB — VITAMIN B12: Vitamin B-12: 199 pg/mL — ABNORMAL LOW (ref 211–911)

## 2017-06-01 LAB — IBC PANEL
Iron: 24 ug/dL — ABNORMAL LOW (ref 42–145)
Saturation Ratios: 5.8 % — ABNORMAL LOW (ref 20.0–50.0)
TRANSFERRIN: 295 mg/dL (ref 212.0–360.0)

## 2017-06-01 MED ORDER — HYDROCODONE-ACETAMINOPHEN 10-325 MG PO TABS: 1.0000 | ORAL_TABLET | Freq: Three times a day (TID) | ORAL | 0 refills | Status: DC | PRN

## 2017-06-01 NOTE — Patient Instructions (Addendum)
Take 35 units R insulin unless eating no starchy food  Check blood sugars on waking up  4/7  Also check blood sugars about 2 hours after a meal and do this after different meals by rotation  Recommended blood sugar levels on waking up is 90-130 and about 2 hours after meal is 130-200  Please bring your blood sugar monitor to each visit, thank you

## 2017-06-01 NOTE — Progress Notes (Signed)
Patient ID: Andrea Wallace, female   DOB: 12/22/1931, 84 y.o.   MRN: [id number]   Reason for Appointment: Diabetes follow-up   History of Present Illness   Diagnosis: Type 2 DIABETES MELITUS, date of diagnosis 1999  Previous history: She has been on insulin for several years to control her diabetes and usually requires large doses Has been on basal bolus regimen for the last few years, taking Lantus twice a day.  Her blood sugars tend to fluctuate significantly but her A1c is usually at a reasonable level She does not clearly benefit from GLP-1 drugs and had been taking Byetta to help with postprandial hyperglycemia, this has been stopped  She had been tried on Victoza, Byetta and Invokana previously and these were  stopped because of unclear benefit       RECENT history:  Insulin regimen:    Novolin N 50 in a.m, 25 at bedtime REGULAR insulin 20 at breakfast, 30  BEFORE SUPPER   Her A1c was last 9.7 and has been consistently high recently  Current management, blood sugar patterns and problems identified:  She still is not sure how much insulin she is taking  She ran out of test strips last Friday and has not checked since then  Otherwise checking blood sugars mostly fasting and occasionally in the evenings probably before supper time, her mealtimes are variable in the evenings in  On an average her blood sugars are appearing better although she has inconsistent readings  She has a couple of good readings both in the morning and at night but they are mostly high  She is complaining about the cost of insulin even though she is getting a prescription for the [employer] brand  Her appetite has improved and she is gaining back some weight  She does not adjust her insulin based on what she is eating, her carbohydrate intake is not consistent  Monitors blood glucose:  2-3 times a day on average.    Glucometer: Freestyle          Blood sugar readings by monitor download,  using FreeStyle meter   Mean values apply above for all meters except median for One Touch  PRE-MEAL Fasting Lunch Dinner Bedtime Overall  Glucose range: 59-342   77-300     Mean/median: 200+     209    POST-MEAL PC Breakfast PC Lunch PC Dinner  Glucose range:   155-263   Mean/median:   217      Oral hypoglycemic drugs: None     Side effects from medications: None  Proper timing of medications in relation to meals: Yes.          Meals: 3 meals per day.   at breakfast she is eating a Half bagel.  Mostly eating cheese crackers or yogurt at lunch and full meal at dinner Usually avoiding  drinks with sugar including juices          Physical activity: exercise: none, has back and leg pain            Dietician visit: Most recent: Years ago       Complications: are: Neuropathy, microalbuminuria     Wt Readings from Last 3 Encounters:  06/01/17 154 lb (69.9 kg)  03/30/17 149 lb 6.4 oz (67.8 kg)  03/23/17 149 lb (67.6 kg)     Lab Results  Component Value Date   HGBA1C 9.7 (H) 03/30/2017   HGBA1C 10.2 01/12/2017   HGBA1C 8.0 09/10/2016  Lab Results  Component Value Date   MICROALBUR 94.4 (H) 03/30/2017   LDLCALC 32 12/05/2013   CREATININE 1.26 (H) 03/30/2017    OTHER problems discussed today: See review of systems   Allergies as of 06/01/2017      Reactions   Codeine Sulfate Itching      Medication List        Accurate as of 06/01/17 12:14 PM. Always use your most recent med list.          acetaminophen 500 MG tablet Commonly known as:  TYLENOL Take 500 mg by mouth daily.   aspirin 81 MG tablet Take 81 mg by mouth at bedtime.   bisacodyl 5 MG EC tablet Commonly known as:  DULCOLAX Take 1 tablet (5 mg total) by mouth 2 (two) times daily.   CRESTOR 40 MG tablet Generic drug:  rosuvastatin Take 40 mg by mouth at bedtime.   DULoxetine 60 MG capsule Commonly known as:  CYMBALTA TAKE ONE CAPSULE BY MOUTH ONCE DAILY   FREESTYLE LIBRE SENSOR SYSTEM Misc 1  Device by Does not apply route every 14 (fourteen) days. [id number]   gabapentin 600 MG tablet Commonly known as:  NEURONTIN TAKE ONE TABLET BY MOUTH THREE TIMES DAILY   glucose blood test strip Commonly known as:  FREESTYLE LITE Use to test blood sugar 3 times daily dx code. E11.9   HYDROcodone-acetaminophen 10-325 MG tablet Commonly known as:  NORCO Take 1 tablet 3 (three) times daily as needed by mouth for severe pain.   hydroxypropyl methylcellulose / hypromellose 2.5 % ophthalmic solution Commonly known as:  ISOPTO TEARS / GONIOVISC Place 1 drop into both eyes 3 (three) times daily as needed for dry eyes.   insulin NPH Human 100 UNIT/ML injection Commonly known as:  NOVOLIN N RELION Inject 55 units before breakfast and 20 units at bedtime   insulin regular 100 units/mL injection Commonly known as:  NOVOLIN R RELION Use 20 units in the morning and 30 units at supper   MEGARED OMEGA-3 KRILL OIL 500 MG Caps Take by mouth. Take one daily   mirtazapine 7.5 MG tablet Commonly known as:  REMERON Take 7.5 mg by mouth.   montelukast 10 MG tablet Commonly known as:  SINGULAIR Take 10 mg by mouth at bedtime.   pantoprazole 40 MG tablet Commonly known as:  PROTONIX Take by mouth.   propranolol 40 MG tablet Commonly known as:  INDERAL Take 40 mg by mouth at bedtime.       Allergies:  Allergies  Allergen Reactions  . Codeine Sulfate Itching    Past Medical History:  Diagnosis Date  . Allergy   . Anemia   . Arthritis   . Asthma    stress related per pt  . Blood transfusion   . Breast cancer [facility]) 2011   Right  . Cancer ([place])    right breast  . Complication of anesthesia    diff  waking up  . Cough   . Diabetes mellitus   . Family history of adverse reaction to anesthesia    " SISTER HAS DIFFICULTY WAKING "  . Generalized headaches   . History of kidney stones   . Hyperlipidemia   . Lung nodules   . Nasal congestion   . Neuromuscular disorder  ([place])    neuropathy feet/legs/hands    Past Surgical History:  Procedure Laterality Date  . BACK SURGERY    . BREAST SURGERY  06/2007   right mastectomy  .  BREAST SURGERY  2011   left  . FOOT SURGERY    . KIDNEY STONE SURGERY    . LITHOTRIPSY    . MASTECTOMY Right 2011   with Radiaiton  . ORIF ELBOW FRACTURE Right 10/28/2016   Procedure: OPEN REDUCTION INTERNAL FIXATION (ORIF) ELBOW/OLECRANON FRACTURE;  Surgeon: Dorna Leitz, MD;  Location: [facility];  Service: Orthopedics;  Laterality: Right;  . REDUCTION MAMMAPLASTY Left 2012  . SHOULDER ARTHROSCOPY     bilateral  . TOTAL KNEE ARTHROPLASTY    . VISCERAL ANGIOGRAPHY N/A 08/06/2016   Procedure: Visceral Angiography;  Surgeon: Algernon Huxley, MD;  Location: [place] [facility];  Service: Cardiovascular;  Laterality: N/A;  . VISCERAL ARTERY INTERVENTION N/A 08/06/2016   Procedure: Visceral Artery Intervention;  Surgeon: Algernon Huxley, MD;  Location: [place] [facility];  Service: Cardiovascular;  Laterality: N/A;    Family History  Problem Relation Age of Onset  . Osteoporosis Mother   . Heart disease Father   . Cancer Brother   . Breast cancer Brother   . Heart disease Sister     Social History:  reports that she quit smoking about 33 years ago. she has never used smokeless tobacco. She reports that she drinks alcohol. She reports that she does not use drugs.  Review of Systems:   Chronic kidney disease: Creatinine has been  upper normal,  followed periodically by nephrologist Last urine microalbumin was high, needs follow-up  Again not on any medications or ARB drugs   Lab Results  Component Value Date   CREATININE 1.26 (H) 03/30/2017     HYPERLIPIDEMIA: The lipid abnormality consists of elevated  triglycerides, has been treated with TriCor Also followed by PCP Last triglycerides from PCP: 254, not checked recently    Lab Results  Component Value Date   CHOL 212 (H) 03/30/2017   HDL 35.50 (L) 03/30/2017   LDLCALC  32 12/05/2013   LDLDIRECT 116.0 03/30/2017   TRIG 349.0 (H) 03/30/2017   CHOLHDL 6 03/30/2017     She has chronic pains and paresthesia in her feet and lower legs Also has had Chronic back pain  She is taking her Cymbalta    Also on gabapentin 600 mg   Refill on hydrocodone was given in August, this was given again She says that she mostly takes this at bedtime and occasionally when she is going out and being active during the day  Last diabetic foot exam was in 8/17 during some sensory loss  She has had a multinodular goiter, this has been euthyroid, Last TSH normal in 2/18 from PCP  Lab Results  Component Value Date   TSH 1.06 03/30/2017        Examination:   BP 124/70   Pulse 85   Ht 5\' 3"  (1.6 m)   Wt 154 lb (69.9 kg)   SpO2 98%   BMI 27.28 kg/m   Body mass index is 27.28 kg/m.     ASSESSMENT/ PLAN:   Diabetes type 2:  See history of present illness for detailed discussion of  current management, blood sugar patterns and problems identified  Her blood sugars are about the same despite increasing her insulin in the morning and reducing the bedtime dose She usually prefers to have Generic or inexpensive medications and although she may do better with U-500 insulin she probably cannot afford this  She has unusually high readings around suppertime, some of these may be right after eating also This is despite her being  fairly compliant with the morning injection and increasing her NPH by 10 units on the last visit Her diet has been fairly good and she is not getting excessive carbohydrate or drinks with sugar   Recommendations:  Will increase her NPH by 5 units in the morning and reduce it by 5 at bedtime to reduce potential for hypoglycemia overnight again  Check A1c today  Balanced meals at all times  Given written instructions for insulin   Reminded her to keep a diary of her insulin doses  Also reminded her to take her insulin at least 15 minutes  before her plan meals  Instead of checking blood sugar of right after eating she will need to check it at bedtime at night  Consider consultation with dietitian  Microalbuminuria: Will recheck today  Goiter: Recheck thyroid levels  100 tablets of hydrocodone given as requested for pain, she can try and take only a half a tablet during the daytime when she is planning to be active  Patient Instructions  Take 35 units R insulin unless eating no starchy food  Check blood sugars on waking up  4/7  Also check blood sugars about 2 hours after a meal and do this after different meals by rotation  Recommended blood sugar levels on waking up is 90-130 and about 2 hours after meal is 130-200  Please bring your blood sugar monitor to each visit, thank you     Counseling time on subjects discussed in assessment and plan sections is over 50% of today's 25 minute visit    Elayne Snare 06/01/2017, 12:14 PM      Note: This office note was prepared with Dragon voice recognition system technology. Any transcriptional errors that result from this process are unintentional.             Patient ID: Andrea Wallace, female   DOB: Oct 09, 1931, 84 y.o.   MRN: [id number]   Reason for Appointment: Diabetes follow-up   History of Present Illness   Diagnosis: Type 2 DIABETES MELITUS, date of diagnosis 1999  Previous history: She has been on insulin for several years to control her diabetes and usually requires large doses Has been on basal bolus regimen for the last few years, taking Lantus twice a day.  Her blood sugars tend to fluctuate significantly but her A1c is usually at a reasonable level She does not clearly benefit from GLP-1 drugs and had been taking Byetta to help with postprandial hyperglycemia, this has been stopped  She had been tried on Victoza, Byetta and Invokana previously and these were  stopped because of unclear benefit       RECENT history:  Insulin regimen:    Novolin  N 50 in a.m, 25 at bedtime REGULAR insulin 20 at breakfast, 30  BEFORE SUPPER    Her A1c has been 10.2 recently, generally around 7-8%  Current management, blood sugar patterns and problems identified:  She appears to have the same pattern of blood sugars before despite adjusting her insulin  Difficult to know why she has fluctuation in her blood sugars especially in the mornings even though she thinks she is trying to take her insulin regularly most of the time  She was given a diary to keep her blood sugar records and her insulin doses but not clear if she is doing this and has not brought this today  More recently her FASTING blood sugars have been closer to normal and not as consistently high; however it was 31  about 3 days ago and 321 the next day in the morning  Blood sugars are fairly consistently high in the evenings with only a couple of readings below 200  Difficult to know whether she is checking her sugar before or after eating in the evening but appears to be checking it either right before, during our soon after her meals around 6-8 PM  No readings at bedtime  HYPOGLYCEMIA has been documented only once  Again blood sugars are higher than expected at suppertime despite her not eating much at lunchtime  Her diet has been about the same as also her weight  She will has been on metformin before and appears to be not taking it now  Monitors blood glucose:  2-3 times a day on average.    Glucometer: Freestyle          Blood sugar readings by monitor download, using FreeStyle meter  Mean values apply above for all meters except median for One Touch  PRE-MEAL Fasting Lunch Dinner Bedtime Overall  Glucose range: 31-366 ?  256  129-424    Mean/median: 200   320   238    Oral hypoglycemic drugs: Metformin ER, 500 mg 2 a day       Side effects from medications: None  Proper timing of medications in relation to meals: Yes.          Meals: 3 meals per day.   at breakfast  she is eating a Half bagel.  Mostly eating cheese crackers or yogurt at lunch and full meal at dinner Usually avoiding all drinks with sugar including juices          Physical activity: exercise: none, has back pain            Dietician visit: Most recent: Years ago       Complications: are: Neuropathy, microalbuminuria     Wt Readings from Last 3 Encounters:  06/01/17 154 lb (69.9 kg)  03/30/17 149 lb 6.4 oz (67.8 kg)  03/23/17 149 lb (67.6 kg)     Lab Results  Component Value Date   HGBA1C 9.7 (H) 03/30/2017   HGBA1C 10.2 01/12/2017   HGBA1C 8.0 09/10/2016   Lab Results  Component Value Date   MICROALBUR 94.4 (H) 03/30/2017   LDLCALC 32 12/05/2013   CREATININE 1.26 (H) 03/30/2017    OTHER problems discussed today: See review of systems   Allergies as of 06/01/2017      Reactions   Codeine Sulfate Itching      Medication List        Accurate as of 06/01/17 12:14 PM. Always use your most recent med list.          acetaminophen 500 MG tablet Commonly known as:  TYLENOL Take 500 mg by mouth daily.   aspirin 81 MG tablet Take 81 mg by mouth at bedtime.   bisacodyl 5 MG EC tablet Commonly known as:  DULCOLAX Take 1 tablet (5 mg total) by mouth 2 (two) times daily.   CRESTOR 40 MG tablet Generic drug:  rosuvastatin Take 40 mg by mouth at bedtime.   DULoxetine 60 MG capsule Commonly known as:  CYMBALTA TAKE ONE CAPSULE BY MOUTH ONCE DAILY   FREESTYLE LIBRE SENSOR SYSTEM Misc 1 Device by Does not apply route every 14 (fourteen) days. [id number]   gabapentin 600 MG tablet Commonly known as:  NEURONTIN TAKE ONE TABLET BY MOUTH THREE TIMES DAILY   glucose blood test strip Commonly known as:  FREESTYLE LITE Use to test blood sugar 3 times daily dx code. E11.9   HYDROcodone-acetaminophen 10-325 MG tablet Commonly known as:  NORCO Take 1 tablet 3 (three) times daily as needed by mouth for severe pain.   hydroxypropyl methylcellulose / hypromellose  2.5 % ophthalmic solution Commonly known as:  ISOPTO TEARS / GONIOVISC Place 1 drop into both eyes 3 (three) times daily as needed for dry eyes.   insulin NPH Human 100 UNIT/ML injection Commonly known as:  NOVOLIN N RELION Inject 55 units before breakfast and 20 units at bedtime   insulin regular 100 units/mL injection Commonly known as:  NOVOLIN R RELION Use 20 units in the morning and 30 units at supper   MEGARED OMEGA-3 KRILL OIL 500 MG Caps Take by mouth. Take one daily   mirtazapine 7.5 MG tablet Commonly known as:  REMERON Take 7.5 mg by mouth.   montelukast 10 MG tablet Commonly known as:  SINGULAIR Take 10 mg by mouth at bedtime.   pantoprazole 40 MG tablet Commonly known as:  PROTONIX Take by mouth.   propranolol 40 MG tablet Commonly known as:  INDERAL Take 40 mg by mouth at bedtime.       Allergies:  Allergies  Allergen Reactions  . Codeine Sulfate Itching    Past Medical History:  Diagnosis Date  . Allergy   . Anemia   . Arthritis   . Asthma    stress related per pt  . Blood transfusion   . Breast cancer [facility]) 2011   Right  . Cancer ([place])    right breast  . Complication of anesthesia    diff  waking up  . Cough   . Diabetes mellitus   . Family history of adverse reaction to anesthesia    " SISTER HAS DIFFICULTY WAKING "  . Generalized headaches   . History of kidney stones   . Hyperlipidemia   . Lung nodules   . Nasal congestion   . Neuromuscular disorder ([place])    neuropathy feet/legs/hands    Past Surgical History:  Procedure Laterality Date  . BACK SURGERY    . BREAST SURGERY  06/2007   right mastectomy  . BREAST SURGERY  2011   left  . FOOT SURGERY    . KIDNEY STONE SURGERY    . LITHOTRIPSY    . MASTECTOMY Right 2011   with Radiaiton  . ORIF ELBOW FRACTURE Right 10/28/2016   Procedure: OPEN REDUCTION INTERNAL FIXATION (ORIF) ELBOW/OLECRANON FRACTURE;  Surgeon: Dorna Leitz, MD;  Location: [facility];  Service: Orthopedics;   Laterality: Right;  . REDUCTION MAMMAPLASTY Left 2012  . SHOULDER ARTHROSCOPY     bilateral  . TOTAL KNEE ARTHROPLASTY    . VISCERAL ANGIOGRAPHY N/A 08/06/2016   Procedure: Visceral Angiography;  Surgeon: Algernon Huxley, MD;  Location: [place] [facility];  Service: Cardiovascular;  Laterality: N/A;  . VISCERAL ARTERY INTERVENTION N/A 08/06/2016   Procedure: Visceral Artery Intervention;  Surgeon: Algernon Huxley, MD;  Location: [place] [facility];  Service: Cardiovascular;  Laterality: N/A;    Family History  Problem Relation Age of Onset  . Osteoporosis Mother   . Heart disease Father   . Cancer Brother   . Breast cancer Brother   . Heart disease Sister     Social History:  reports that she quit smoking about 33 years ago. she has never used smokeless tobacco. She reports that she drinks alcohol. She reports that she does not use drugs.  Review of Systems:  FATIGUE: She is complaining of increasing tiredness and she says she cannot do much and feels like she has to sit down and lied down.  Her PCP says that she is stressed However she had anemia about a year ago and this has not been followed up  Chronic kidney disease: Creatinine has been  upper normal,  followed periodically by PCP Last urine microalbumin was high,   Again not on any medications or ARB drugs because of her normal blood pressure   Lab Results  Component Value Date   CREATININE 1.26 (H) 03/30/2017     HYPERLIPIDEMIA: The lipid abnormality consists of elevated  triglycerides, has been treated with TriCor Also followed by PCP    Lab Results  Component Value Date   CHOL 212 (H) 03/30/2017   HDL 35.50 (L) 03/30/2017   LDLCALC 32 12/05/2013   LDLDIRECT 116.0 03/30/2017   TRIG 349.0 (H) 03/30/2017   CHOLHDL 6 03/30/2017     She has chronic pains and paresthesia in her feet and lower legs Also has had Chronic back pain  She is taking her Cymbalta  and is Also on gabapentin 600 mg   Refill on  hydrocodone was given in November with 100 tablets and another 100 tablets sent today, she thinks she is using it regularly and is out of it now  Last diabetic foot exam was in 8/17 during some sensory loss  She has had a multinodular goiter, this has been euthyroid, Last TSH normal in 10/18 from PCP  Lab Results  Component Value Date   TSH 1.06 03/30/2017        Examination:   BP 124/70   Pulse 85   Ht 5\' 3"  (1.6 m)   Wt 154 lb (69.9 kg)   SpO2 98%   BMI 27.28 kg/m   Body mass index is 27.28 kg/m.     ASSESSMENT/ PLAN:   Diabetes type 2:  See history of present illness for detailed discussion of  current management, blood sugar patterns and problems identified  Her blood sugars are still poorly controlled She is not checking her blood sugars quite as often and difficult to know what her insulin doses are since she does not remember them Also not clear if her evening readings are before or after eating Today she did not take insulin before breakfast and cannot judge her postprandial reading in the morning also  Although she is complaining about the cost of insulin she is getting the [employer] brand and explained to her that she needs to discuss this with the pharmacy  For now will continue her insulin dose unchanged since she is not having any consistent pattern and her average blood sugar is just over 200 currently However recommended that she take 35 units of regular insulin at suppertime unless she is eating a small or low carbohydrate meal.  She can continue same doses of NPH for now More consistent monitoring especially after meals   FATIGUE and history of anemia: Will recheck her labs and forward them to PCP  Also will need reassessment of her renal function  100 tablets of hydrocodone given as requested for pain, urine drug screen to be done for pain management purposes   Patient Instructions  Take 35 units R insulin unless eating no starchy food  Check blood  sugars on waking up  4/7  Also check blood sugars about 2 hours after a meal and do this after different meals by rotation  Recommended blood sugar levels on waking up is 90-130 and about 2 hours after meal is 130-200  Please bring your blood sugar monitor to each visit, thank you     Counseling time on subjects discussed in assessment and plan sections is over 50% of today's 25 minute visit    Elayne Snare 06/01/2017, 12:14 PM      Note: This office note was prepared with Dragon voice recognition system technology. Any transcriptional errors that result from this process are unintentional.

## 2017-06-02 LAB — FRUCTOSAMINE: FRUCTOSAMINE: 383 umol/L — AB (ref 0–285)

## 2017-06-04 LAB — PAIN MGMT, PROFILE 8 W/CONF, U
6 Acetylmorphine: NEGATIVE ng/mL (ref <10)
Alcohol Metabolites: NEGATIVE ng/mL (ref <500)
Amphetamines: NEGATIVE ng/mL (ref <500)
Benzodiazepines: NEGATIVE ng/mL (ref <100)
Buprenorphine, Urine: NEGATIVE ng/mL (ref <5)
CREATININE: 32.6 mg/dL
Cocaine Metabolite: NEGATIVE ng/mL (ref <150)
Codeine: NEGATIVE ng/mL (ref <50)
HYDROMORPHONE: 108 ng/mL — AB (ref <50)
Hydrocodone: 326 ng/mL — ABNORMAL HIGH (ref <50)
MARIJUANA METABOLITE: NEGATIVE ng/mL (ref <20)
MDMA: NEGATIVE ng/mL (ref <500)
Morphine: NEGATIVE ng/mL (ref <50)
Norhydrocodone: 383 ng/mL — ABNORMAL HIGH (ref <50)
OXIDANT: NEGATIVE ug/mL (ref <200)
Opiates: POSITIVE ng/mL — AB (ref <100)
Oxycodone: NEGATIVE ng/mL (ref <100)
PH: 6.1 (ref 4.5–9.0)

## 2017-06-06 NOTE — Progress Notes (Signed)
[place]  Telephone:(336) [phone] Fax:(336) [phone]  ID: Andrea Wallace OB: Jan 10, 1932  MR#: [id number]  DJM#:[id number]  Patient Care Team: Kirk Ruths, MD as PCP - General (Unknown Physician Specialty)  CHIEF COMPLAINT: Anemia associated with acute blood loss. Left lower lobe lung mass, history of breast cancer.  INTERVAL HISTORY: Patient was last evaluated in March of 2018.  She is referred by for worsening symptomatic anemia. Currently, she has increased weakness and fatigue, but otherwise feels well. She denies any chest pain, cough, hemoptysis, or shortness of breath. She has no neurologic complaints. She denies any recent fevers or illnesses. She has a good appetite and denies any weight loss. She has no nausea, vomiting, constipation, or diarrhea.  She denies any melena or hematochezia.  She has no urinary complaints. Patient offers no further specific complaints today.  REVIEW OF SYSTEMS:   Review of Systems  Constitutional: Positive for malaise/fatigue. Negative for fever and weight loss.  Respiratory: Negative for cough, hemoptysis, sputum production and shortness of breath.   Cardiovascular: Negative.  Negative for chest pain and leg swelling.  Gastrointestinal: Negative.  Negative for abdominal pain, blood in stool and melena.  Genitourinary: Negative.   Musculoskeletal: Negative.   Skin: Negative.  Negative for rash.  Neurological: Positive for weakness.  Psychiatric/Behavioral: The patient is nervous/anxious.     As per HPI. Otherwise, a complete review of systems is negative.  PAST MEDICAL HISTORY: Past Medical History:  Diagnosis Date  . Allergy   . Anemia   . Arthritis   . Asthma    stress related per pt  . Blood transfusion   . Breast cancer [facility]) 2011   Right  . Cancer ([place])    right breast  . Complication of anesthesia    diff  waking up  . Cough   . Diabetes mellitus   . Family history of adverse reaction to anesthesia     " SISTER HAS DIFFICULTY WAKING "  . Generalized headaches   . History of kidney stones   . Hyperlipidemia   . Lung nodules   . Nasal congestion   . Neuromuscular disorder ([place])    neuropathy feet/legs/hands    PAST SURGICAL HISTORY: Past Surgical History:  Procedure Laterality Date  . BACK SURGERY    . BREAST SURGERY  06/2007   right mastectomy  . BREAST SURGERY  2011   left  . FOOT SURGERY    . KIDNEY STONE SURGERY    . LITHOTRIPSY    . MASTECTOMY Right 2011   with Radiaiton  . ORIF ELBOW FRACTURE Right 10/28/2016   Procedure: OPEN REDUCTION INTERNAL FIXATION (ORIF) ELBOW/OLECRANON FRACTURE;  Surgeon: Dorna Leitz, MD;  Location: [place];  Service: Orthopedics;  Laterality: Right;  . REDUCTION MAMMAPLASTY Left 2012  . SHOULDER ARTHROSCOPY     bilateral  . TOTAL KNEE ARTHROPLASTY    . VISCERAL ANGIOGRAPHY N/A 08/06/2016   Procedure: Visceral Angiography;  Surgeon: Algernon Huxley, MD;  Location: [place] CV LAB;  Service: Cardiovascular;  Laterality: N/A;  . VISCERAL ARTERY INTERVENTION N/A 08/06/2016   Procedure: Visceral Artery Intervention;  Surgeon: Algernon Huxley, MD;  Location: [place] [facility];  Service: Cardiovascular;  Laterality: N/A;    FAMILY HISTORY Family History  Problem Relation Age of Onset  . Osteoporosis Mother   . Heart disease Father   . Cancer Brother   . Breast cancer Brother   . Heart disease Sister  ADVANCED DIRECTIVES:    HEALTH MAINTENANCE: Social History   Tobacco Use  . Smoking status: Former Smoker    Last attempt to quit: 03/15/1984    Years since quitting: 33.2  . Smokeless tobacco: Never Used  Substance Use Topics  . Alcohol use: Yes    Comment: occ  . Drug use: No     Colonoscopy:  PAP:  Bone density:  Lipid panel:  Allergies  Allergen Reactions  . Ciprofloxacin Diarrhea and Swelling  . Codeine Sulfate Itching    Current Outpatient Medications  Medication Sig Dispense Refill  . acetaminophen (TYLENOL)  500 MG tablet Take 500 mg by mouth daily.     Marland Kitchen aspirin 81 MG tablet Take 81 mg by mouth at bedtime.     Marland Kitchen atorvastatin (LIPITOR) 80 MG tablet TAKE 1 TABLET BY MOUTH ONCE DAILY    . bisacodyl (DULCOLAX) 5 MG EC tablet Take 1 tablet (5 mg total) by mouth 2 (two) times daily. (Patient taking differently: Take 10 mg by mouth at bedtime. ) 14 tablet 0  . Continuous Blood Gluc Sensor (FREESTYLE LIBRE SENSOR SYSTEM) MISC 1 Device by Does not apply route every 14 (fourteen) days. [id number] 19 each 3  . CRESTOR 40 MG tablet Take 40 mg by mouth at bedtime.     . Cyanocobalamin (B-12 TR) 2000 MCG TBCR Take by mouth.    . diclofenac sodium (VOLTAREN) 1 % GEL Apply topically.    . DULoxetine (CYMBALTA) 60 MG capsule TAKE ONE CAPSULE BY MOUTH ONCE DAILY 30 capsule 2  . gabapentin (NEURONTIN) 600 MG tablet TAKE ONE TABLET BY MOUTH THREE TIMES DAILY 90 tablet 5  . glucose blood (FREESTYLE LITE) test strip Use to test blood sugar 3 times daily dx code. E11.9 100 each 5  . HYDROcodone-acetaminophen (NORCO) 10-325 MG tablet Take 1 tablet by mouth 3 (three) times daily as needed for severe pain. 100 tablet 0  . hydroxypropyl methylcellulose / hypromellose (ISOPTO TEARS / GONIOVISC) 2.5 % ophthalmic solution Place 1 drop into both eyes 3 (three) times daily as needed for dry eyes.    . insulin NPH Human (NOVOLIN N RELION) 100 UNIT/ML injection Inject 55 units before breakfast and 20 units at bedtime 20 mL 3  . insulin regular (NOVOLIN R RELION) 100 units/mL injection Use 20 units in the morning and 30 units at supper 20 mL 3  . MEGARED OMEGA-3 KRILL OIL 500 MG CAPS Take by mouth. Take one daily    . mirtazapine (REMERON) 7.5 MG tablet Take 7.5 mg by mouth.    . montelukast (SINGULAIR) 10 MG tablet Take 10 mg by mouth at bedtime.     . pantoprazole (PROTONIX) 40 MG tablet Take by mouth.    . propranolol (INDERAL) 40 MG tablet Take 40 mg by mouth at bedtime.    . traMADol (ULTRAM) 50 MG tablet Take 50 mg by  mouth.     No current facility-administered medications for this visit.     OBJECTIVE: Vitals:   06/08/17 0922  BP: 139/76  Pulse: (!) 110  Temp: 97.6 F (36.4 C)     Body mass index is 27.05 kg/m.    ECOG FS:1 - Symptomatic but completely ambulatory  General: Well-developed, well-nourished, no acute distress. Eyes: Pink conjunctiva, anicteric sclera. Lungs: Scattered wheezing throughout. Heart: Regular rate and rhythm. No rubs, murmurs, or gallops. Abdomen: Soft, nontender, nondistended. No organomegaly noted, normoactive bowel sounds. Musculoskeletal: No edema, cyanosis, or clubbing. Neuro: Alert, answering all questions appropriately.  Cranial nerves grossly intact. Skin: No rashes or petechiae noted. Psych: Normal affect.  LAB RESULTS:  Lab Results  Component Value Date   NA 133 (L) 06/01/2017   K 4.7 06/01/2017   CL 100 06/01/2017   CO2 24 06/01/2017   GLUCOSE 424 (H) 06/01/2017   BUN 28 (H) 06/01/2017   CREATININE 1.07 06/01/2017   CALCIUM 8.6 06/01/2017   PROT 6.5 06/01/2017   ALBUMIN 3.5 06/01/2017   AST 18 06/01/2017   ALT 14 06/01/2017   ALKPHOS 97 06/01/2017   BILITOT 0.3 06/01/2017   GFRNONAA 48 (L) 10/28/2016   GFRAA 55 (L) 10/28/2016    Lab Results  Component Value Date   WBC 8.3 06/08/2017   NEUTROABS 6.1 06/08/2017   HGB 7.4 (L) 06/08/2017   HCT 23.8 (L) 06/08/2017   MCV 76.5 (L) 06/08/2017   PLT 347 06/08/2017   Lab Results  Component Value Date   LABCA2 26.0 12/02/2015     STUDIES: No results found.  ASSESSMENT: Anemia associated with acute blood loss. Left lower lobe lung mass, history of breast cancer.  PLAN:    1. Anemia associated with acute blood loss: Patient's hemoglobin and iron stores have significantly decreased.  The etiology is unclear, but thought to be secondary to chronic GI blood loss.  Patient is symptomatic therefore will proceed with 1 unit of packed red blood cells today.  Patient may benefit from IV Feraheme in  the future if her hemoglobin is improved.  Return to clinic in 2 weeks with repeat laboratory work and further evaluation. 3. Left lower lobe lung mass: CT results from February 15, 2017 reviewed independently with left lower lobe nodule unchanged from previous.  She was noted to have slowly progressive mediastinal and hilar adenopathy of indeterminate etiology.  No intervention is needed at this time.  Consider repeat CT scan in March 2019 to assess for any interval change of her lymphadenopathy.   3. History of breast cancer: Continue mammograms as ordered. CA-27-29 is within normal limits.  4. Hyperglycemia: Continue current diabetic medications.  Approximately 30 minutes spent in discussion of which greater than 50% was consultation.   Patient expressed understanding and was in agreement with this plan. She also understands that She can call clinic at any time with any questions, concerns, or complaints.    Lloyd Huger, MD   06/09/2017 4:22 PM

## 2017-06-08 ENCOUNTER — Other Ambulatory Visit: Payer: Self-pay

## 2017-06-08 ENCOUNTER — Encounter: Payer: Self-pay | Admitting: Oncology

## 2017-06-08 ENCOUNTER — Inpatient Hospital Stay: Payer: Medicare Other | Attending: Oncology

## 2017-06-08 ENCOUNTER — Inpatient Hospital Stay (HOSPITAL_BASED_OUTPATIENT_CLINIC_OR_DEPARTMENT_OTHER): Payer: Medicare Other | Admitting: Oncology

## 2017-06-08 ENCOUNTER — Inpatient Hospital Stay: Payer: Medicare Other

## 2017-06-08 VITALS — BP 139/76 | HR 110 | Temp 97.6°F | Wt 152.7 lb

## 2017-06-08 DIAGNOSIS — Z853 Personal history of malignant neoplasm of breast: Secondary | ICD-10-CM | POA: Insufficient documentation

## 2017-06-08 DIAGNOSIS — R5383 Other fatigue: Secondary | ICD-10-CM | POA: Diagnosis not present

## 2017-06-08 DIAGNOSIS — R531 Weakness: Secondary | ICD-10-CM | POA: Insufficient documentation

## 2017-06-08 DIAGNOSIS — D62 Acute posthemorrhagic anemia: Secondary | ICD-10-CM

## 2017-06-08 DIAGNOSIS — E1165 Type 2 diabetes mellitus with hyperglycemia: Secondary | ICD-10-CM | POA: Insufficient documentation

## 2017-06-08 DIAGNOSIS — R918 Other nonspecific abnormal finding of lung field: Secondary | ICD-10-CM | POA: Diagnosis not present

## 2017-06-08 LAB — SAMPLE TO BLOOD BANK

## 2017-06-08 LAB — CBC WITH DIFFERENTIAL/PLATELET
BASOS PCT: 3 %
Basophils Absolute: 0.2 10*3/uL — ABNORMAL HIGH (ref 0–0.1)
EOS ABS: 0.3 10*3/uL (ref 0–0.7)
EOS PCT: 4 %
HCT: 23.8 % — ABNORMAL LOW (ref 35.0–47.0)
Hemoglobin: 7.4 g/dL — ABNORMAL LOW (ref 12.0–16.0)
Lymphocytes Relative: 14 %
Lymphs Abs: 1.1 10*3/uL (ref 1.0–3.6)
MCH: 23.8 pg — AB (ref 26.0–34.0)
MCHC: 31 g/dL — AB (ref 32.0–36.0)
MCV: 76.5 fL — ABNORMAL LOW (ref 80.0–100.0)
Monocytes Absolute: 0.6 10*3/uL (ref 0.2–0.9)
Monocytes Relative: 7 %
Neutro Abs: 6.1 10*3/uL (ref 1.4–6.5)
Neutrophils Relative %: 72 %
PLATELETS: 347 10*3/uL (ref 150–440)
RBC: 3.11 MIL/uL — AB (ref 3.80–5.20)
RDW: 18.9 % — ABNORMAL HIGH (ref 11.5–14.5)
WBC: 8.3 10*3/uL (ref 3.6–11.0)

## 2017-06-08 LAB — RETICULOCYTES
RBC.: 3.19 MIL/uL — AB (ref 3.80–5.20)
RETIC COUNT ABSOLUTE: 102.1 10*3/uL (ref 19.0–183.0)
RETIC CT PCT: 3.2 % — AB (ref 0.4–3.1)

## 2017-06-08 LAB — LACTATE DEHYDROGENASE: LDH: 199 U/L — ABNORMAL HIGH (ref 98–192)

## 2017-06-08 LAB — FERRITIN: Ferritin: 8 ng/mL — ABNORMAL LOW (ref 11–307)

## 2017-06-08 LAB — IRON AND TIBC
IRON: 27 ug/dL — AB (ref 28–170)
Saturation Ratios: 7 % — ABNORMAL LOW (ref 10.4–31.8)
TIBC: 372 ug/dL (ref 250–450)
UIBC: 345 ug/dL

## 2017-06-08 LAB — ABO/RH: ABO/RH(D): A POS

## 2017-06-08 LAB — PREPARE RBC (CROSSMATCH)

## 2017-06-08 MED ORDER — ACETAMINOPHEN 325 MG PO TABS
650.0000 mg | ORAL_TABLET | Freq: Once | ORAL | Status: AC
  Administered 2017-06-08: 650 mg via ORAL
  Filled 2017-06-08: qty 2

## 2017-06-08 MED ORDER — SODIUM CHLORIDE 0.9 % IV SOLN
250.0000 mL | Freq: Once | INTRAVENOUS | Status: AC
  Administered 2017-06-08: 250 mL via INTRAVENOUS
  Filled 2017-06-08: qty 250

## 2017-06-08 MED ORDER — DIPHENHYDRAMINE HCL 50 MG/ML IJ SOLN
25.0000 mg | Freq: Once | INTRAMUSCULAR | Status: AC
  Administered 2017-06-08: 25 mg via INTRAVENOUS
  Filled 2017-06-08: qty 1

## 2017-06-09 LAB — BPAM RBC
Blood Product Expiration Date: 201901292359
ISSUE DATE / TIME: 201901151232
Unit Type and Rh: 6200

## 2017-06-09 LAB — TYPE AND SCREEN
ABO/RH(D): A POS
Antibody Screen: NEGATIVE
Unit division: 0

## 2017-06-20 NOTE — Progress Notes (Signed)
[place]  Telephone:(336) [phone] Fax:(336) [phone]  ID: Dolly Rias OB: [birth year]  MR#: [id number]  HTD#:[id number]  Patient Care Team: Kirk Ruths, MD as PCP - General (Unknown Physician Specialty)  CHIEF COMPLAINT: Anemia associated with acute blood loss. Left lower lobe lung mass, history of breast cancer.  INTERVAL HISTORY: Patient returns to clinic today for repeat laboratory work and further evaluation.  She feels mildly improved after receiving 1 unit of packed red blood cells recently.  She continues to complain of chronic weakness and fatigue.  She has had a difficult time over the past week as her husband recently broke his hip.  She denies any chest pain, cough, hemoptysis, or shortness of breath. She has no neurologic complaints. She denies any recent fevers or illnesses. She has a good appetite and denies any weight loss. She has no nausea, vomiting, constipation, or diarrhea.  She denies any melena or hematochezia.  She has no urinary complaints. Patient offers no further specific complaints today.  REVIEW OF SYSTEMS:   Review of Systems  Constitutional: Positive for malaise/fatigue. Negative for fever and weight loss.  Respiratory: Negative for cough, hemoptysis, sputum production and shortness of breath.   Cardiovascular: Negative.  Negative for chest pain and leg swelling.  Gastrointestinal: Negative.  Negative for abdominal pain, blood in stool and melena.  Genitourinary: Negative.   Musculoskeletal: Negative.   Skin: Negative.  Negative for rash.  Neurological: Positive for weakness.  Psychiatric/Behavioral: The patient is nervous/anxious.     As per HPI. Otherwise, a complete review of systems is negative.  PAST MEDICAL HISTORY: Past Medical History:  Diagnosis Date  . Allergy   . Anemia   . Arthritis   . Asthma    stress related per pt  . Blood transfusion   . Breast cancer [facility]) 2011   Right  . Cancer ([place])    right  breast  . Complication of anesthesia    diff  waking up  . Cough   . Diabetes mellitus   . Family history of adverse reaction to anesthesia    " SISTER HAS DIFFICULTY WAKING "  . Generalized headaches   . History of kidney stones   . Hyperlipidemia   . Lung nodules   . Nasal congestion   . Neuromuscular disorder ([place])    neuropathy feet/legs/hands    PAST SURGICAL HISTORY: Past Surgical History:  Procedure Laterality Date  . BACK SURGERY    . BREAST SURGERY  06/2007   right mastectomy  . BREAST SURGERY  2011   left  . FOOT SURGERY    . KIDNEY STONE SURGERY    . LITHOTRIPSY    . MASTECTOMY Right 2011   with Radiaiton  . ORIF ELBOW FRACTURE Right 10/28/2016   Procedure: OPEN REDUCTION INTERNAL FIXATION (ORIF) ELBOW/OLECRANON FRACTURE;  Surgeon: Dorna Leitz, MD;  Location: [place];  Service: Orthopedics;  Laterality: Right;  . REDUCTION MAMMAPLASTY Left 2012  . SHOULDER ARTHROSCOPY     bilateral  . TOTAL KNEE ARTHROPLASTY    . VISCERAL ANGIOGRAPHY N/A 08/06/2016   Procedure: Visceral Angiography;  Surgeon: Algernon Huxley, MD;  Location: [place] CV LAB;  Service: Cardiovascular;  Laterality: N/A;  . VISCERAL ARTERY INTERVENTION N/A 08/06/2016   Procedure: Visceral Artery Intervention;  Surgeon: Algernon Huxley, MD;  Location: [place] CV LAB;  Service: Cardiovascular;  Laterality: N/A;    FAMILY HISTORY Family History  Problem Relation Age of Onset  . Osteoporosis Mother   .  Heart disease Father   . Cancer Brother   . Breast cancer Brother   . Heart disease Sister        ADVANCED DIRECTIVES:    HEALTH MAINTENANCE: Social History   Tobacco Use  . Smoking status: Former Smoker    Last attempt to quit: 03/15/1984    Years since quitting: 33.2  . Smokeless tobacco: Never Used  Substance Use Topics  . Alcohol use: Yes    Comment: occ  . Drug use: No     Colonoscopy:  PAP:  Bone density:  Lipid panel:  Allergies  Allergen Reactions  . Ciprofloxacin  Diarrhea and Swelling  . Codeine Sulfate Itching    Current Outpatient Medications  Medication Sig Dispense Refill  . acetaminophen (TYLENOL) 500 MG tablet Take 500 mg by mouth daily.     Marland Kitchen aspirin 81 MG tablet Take 81 mg by mouth at bedtime.     Marland Kitchen atorvastatin (LIPITOR) 80 MG tablet TAKE 1 TABLET BY MOUTH ONCE DAILY    . bisacodyl (DULCOLAX) 5 MG EC tablet Take 1 tablet (5 mg total) by mouth 2 (two) times daily. (Patient taking differently: Take 10 mg by mouth at bedtime. ) 14 tablet 0  . Continuous Blood Gluc Sensor (FREESTYLE LIBRE SENSOR SYSTEM) MISC 1 Device by Does not apply route every 14 (fourteen) days. [id number] 40 each 3  . CRESTOR 40 MG tablet Take 40 mg by mouth at bedtime.     . Cyanocobalamin (B-12 TR) 2000 MCG TBCR Take by mouth.    . diclofenac sodium (VOLTAREN) 1 % GEL Apply topically.    . DULoxetine (CYMBALTA) 60 MG capsule TAKE ONE CAPSULE BY MOUTH ONCE DAILY 30 capsule 2  . gabapentin (NEURONTIN) 600 MG tablet TAKE ONE TABLET BY MOUTH THREE TIMES DAILY 90 tablet 5  . glucose blood (FREESTYLE LITE) test strip Use to test blood sugar 3 times daily dx code. E11.9 100 each 5  . HYDROcodone-acetaminophen (NORCO) 10-325 MG tablet Take 1 tablet by mouth 3 (three) times daily as needed for severe pain. 100 tablet 0  . hydroxypropyl methylcellulose / hypromellose (ISOPTO TEARS / GONIOVISC) 2.5 % ophthalmic solution Place 1 drop into both eyes 3 (three) times daily as needed for dry eyes.    . insulin NPH Human (NOVOLIN N RELION) 100 UNIT/ML injection Inject 55 units before breakfast and 20 units at bedtime 20 mL 3  . insulin regular (NOVOLIN R RELION) 100 units/mL injection Use 20 units in the morning and 30 units at supper 20 mL 3  . MEGARED OMEGA-3 KRILL OIL 500 MG CAPS Take by mouth. Take one daily    . mirtazapine (REMERON) 7.5 MG tablet Take 7.5 mg by mouth.    . montelukast (SINGULAIR) 10 MG tablet Take 10 mg by mouth at bedtime.     . pantoprazole (PROTONIX) 40 MG  tablet Take by mouth.    . propranolol (INDERAL) 40 MG tablet Take 40 mg by mouth at bedtime.    . traMADol (ULTRAM) 50 MG tablet Take 50 mg by mouth.     No current facility-administered medications for this visit.     OBJECTIVE: Vitals:   06/22/17 0926  BP: 117/62  Pulse: (!) 104  Resp: 18  Temp: 97.8 F (36.6 C)     Body mass index is 26.38 kg/m.    ECOG FS:1 - Symptomatic but completely ambulatory  General: Well-developed, well-nourished, no acute distress. Eyes: Pink conjunctiva, anicteric sclera. Lungs: Scattered wheezing throughout. Heart: Regular  rate and rhythm. No rubs, murmurs, or gallops. Abdomen: Soft, nontender, nondistended. No organomegaly noted, normoactive bowel sounds. Musculoskeletal: No edema, cyanosis, or clubbing. Neuro: Alert, answering all questions appropriately. Cranial nerves grossly intact. Skin: No rashes or petechiae noted. Psych: Normal affect.  LAB RESULTS:  Lab Results  Component Value Date   NA 133 (L) 06/01/2017   K 4.7 06/01/2017   CL 100 06/01/2017   CO2 24 06/01/2017   GLUCOSE 424 (H) 06/01/2017   BUN 28 (H) 06/01/2017   CREATININE 1.07 06/01/2017   CALCIUM 8.6 06/01/2017   PROT 6.5 06/01/2017   ALBUMIN 3.5 06/01/2017   AST 18 06/01/2017   ALT 14 06/01/2017   ALKPHOS 97 06/01/2017   BILITOT 0.3 06/01/2017   GFRNONAA 48 (L) 10/28/2016   GFRAA 55 (L) 10/28/2016    Lab Results  Component Value Date   WBC 6.6 06/22/2017   NEUTROABS 5.0 06/22/2017   HGB 9.2 (L) 06/22/2017   HCT 29.8 (L) 06/22/2017   MCV 78.7 (L) 06/22/2017   PLT 298 06/22/2017   Lab Results  Component Value Date   LABCA2 26.0 12/02/2015     STUDIES: No results found.  ASSESSMENT: Anemia associated with acute blood loss. Left lower lobe lung mass, history of breast cancer.  PLAN:    1. Anemia associated with acute blood loss: Patient's hemoglobin has improved and is now 9.2. The etiology is unclear, but thought to be secondary to chronic GI blood  loss.  She does not require additional blood today, but will proceed with 510 mg IV Feraheme.  Return to clinic in 1 week for a second infusion.  Patient will then return to clinic in 1 month for repeat laboratory work and further evaluation.   2. Left lower lobe lung mass: CT results from February 15, 2017 reviewed independently with left lower lobe nodule unchanged from previous.  She was noted to have slowly progressive mediastinal and hilar adenopathy of indeterminate etiology.  No intervention is needed at this time.  Consider repeat CT scan in March 2019 to assess for any interval change of her lymphadenopathy.   3. History of breast cancer: Continue mammograms as ordered. CA-27-29 is within normal limits.  4. Hyperglycemia: Continue current diabetic medications.  Approximately 30 minutes spent in discussion of which greater than 50% was consultation.   Patient expressed understanding and was in agreement with this plan. She also understands that She can call clinic at any time with any questions, concerns, or complaints.    Lloyd Huger, MD   06/22/2017 9:54 AM

## 2017-06-22 ENCOUNTER — Inpatient Hospital Stay: Payer: Medicare Other

## 2017-06-22 ENCOUNTER — Inpatient Hospital Stay (HOSPITAL_BASED_OUTPATIENT_CLINIC_OR_DEPARTMENT_OTHER): Payer: Medicare Other | Admitting: Oncology

## 2017-06-22 VITALS — BP 117/62 | HR 104 | Temp 97.8°F | Resp 18 | Wt 148.9 lb

## 2017-06-22 DIAGNOSIS — D62 Acute posthemorrhagic anemia: Secondary | ICD-10-CM | POA: Diagnosis not present

## 2017-06-22 DIAGNOSIS — Z853 Personal history of malignant neoplasm of breast: Secondary | ICD-10-CM

## 2017-06-22 DIAGNOSIS — E1165 Type 2 diabetes mellitus with hyperglycemia: Secondary | ICD-10-CM

## 2017-06-22 DIAGNOSIS — R918 Other nonspecific abnormal finding of lung field: Secondary | ICD-10-CM

## 2017-06-22 LAB — CBC WITH DIFFERENTIAL/PLATELET
BASOS PCT: 1 %
Basophils Absolute: 0.1 10*3/uL (ref 0–0.1)
EOS ABS: 0.2 10*3/uL (ref 0–0.7)
Eosinophils Relative: 4 %
HCT: 29.8 % — ABNORMAL LOW (ref 35.0–47.0)
Hemoglobin: 9.2 g/dL — ABNORMAL LOW (ref 12.0–16.0)
Lymphocytes Relative: 12 %
Lymphs Abs: 0.8 10*3/uL — ABNORMAL LOW (ref 1.0–3.6)
MCH: 24.2 pg — ABNORMAL LOW (ref 26.0–34.0)
MCHC: 30.8 g/dL — AB (ref 32.0–36.0)
MCV: 78.7 fL — ABNORMAL LOW (ref 80.0–100.0)
MONO ABS: 0.5 10*3/uL (ref 0.2–0.9)
MONOS PCT: 8 %
Neutro Abs: 5 10*3/uL (ref 1.4–6.5)
Neutrophils Relative %: 75 %
PLATELETS: 298 10*3/uL (ref 150–440)
RBC: 3.79 MIL/uL — ABNORMAL LOW (ref 3.80–5.20)
RDW: 19.8 % — AB (ref 11.5–14.5)
WBC: 6.6 10*3/uL (ref 3.6–11.0)

## 2017-06-22 LAB — SAMPLE TO BLOOD BANK

## 2017-06-22 MED ORDER — SODIUM CHLORIDE 0.9 % IV SOLN
Freq: Once | INTRAVENOUS | Status: AC
  Administered 2017-06-22: 10:00:00 via INTRAVENOUS
  Filled 2017-06-22: qty 1000

## 2017-06-22 MED ORDER — SODIUM CHLORIDE 0.9 % IV SOLN
510.0000 mg | Freq: Once | INTRAVENOUS | Status: AC
  Administered 2017-06-22: 510 mg via INTRAVENOUS
  Filled 2017-06-22: qty 17

## 2017-06-22 NOTE — Progress Notes (Signed)
Patient denies any concerns today.  

## 2017-06-23 LAB — HAPTOGLOBIN: HAPTOGLOBIN: 252 mg/dL — AB (ref 34–200)

## 2017-06-29 ENCOUNTER — Inpatient Hospital Stay: Payer: Medicare Other | Attending: Oncology

## 2017-06-29 VITALS — BP 151/77 | HR 101 | Temp 98.7°F | Resp 20

## 2017-06-29 DIAGNOSIS — D62 Acute posthemorrhagic anemia: Secondary | ICD-10-CM

## 2017-06-29 DIAGNOSIS — D509 Iron deficiency anemia, unspecified: Secondary | ICD-10-CM | POA: Diagnosis present

## 2017-06-29 MED ORDER — FERUMOXYTOL INJECTION 510 MG/17 ML
510.0000 mg | Freq: Once | INTRAVENOUS | Status: AC
  Administered 2017-06-29: 510 mg via INTRAVENOUS
  Filled 2017-06-29: qty 17

## 2017-06-29 MED ORDER — SODIUM CHLORIDE 0.9 % IV SOLN
Freq: Once | INTRAVENOUS | Status: AC
  Administered 2017-06-29: 14:00:00 via INTRAVENOUS
  Filled 2017-06-29: qty 1000

## 2017-07-23 ENCOUNTER — Other Ambulatory Visit: Payer: Self-pay | Admitting: *Deleted

## 2017-07-23 DIAGNOSIS — D509 Iron deficiency anemia, unspecified: Secondary | ICD-10-CM

## 2017-07-23 LAB — HM DIABETES EYE EXAM

## 2017-07-23 NOTE — Progress Notes (Signed)
bc

## 2017-07-24 NOTE — Progress Notes (Deleted)
[place]  [phone] [phone]  ID: Dolly Rias OB: 1932/02/13  MR#: [id number]  PYK#:[id number]  Patient Care Team: Kirk Ruths, MD as PCP - General (Unknown Physician Specialty)  CHIEF COMPLAINT: Anemia associated with acute blood loss. Left lower lobe lung mass, history of breast cancer.  INTERVAL HISTORY: Patient returns to clinic today for repeat laboratory work and further evaluation.  She feels mildly improved after receiving 1 unit of packed red blood cells recently.  She continues to complain of chronic weakness and fatigue.  She has had a difficult time over the past week as her husband recently broke his hip.  She denies any chest pain, cough, hemoptysis, or shortness of breath. She has no neurologic complaints. She denies any recent fevers or illnesses. She has a good appetite and denies any weight loss. She has no nausea, vomiting, constipation, or diarrhea.  She denies any melena or hematochezia.  She has no urinary complaints. Patient offers no further specific complaints today.  REVIEW OF SYSTEMS:   Review of Systems  Constitutional: Positive for malaise/fatigue. Negative for fever and weight loss.  Respiratory: Negative for cough, hemoptysis, sputum production and shortness of breath.   Cardiovascular: Negative.  Negative for chest pain and leg swelling.  Gastrointestinal: Negative.  Negative for abdominal pain, blood in stool and melena.  Genitourinary: Negative.   Musculoskeletal: Negative.   Skin: Negative.  Negative for rash.  Neurological: Positive for weakness.  Psychiatric/Behavioral: The patient is nervous/anxious.     As per HPI. Otherwise, a complete review of systems is negative.  PAST MEDICAL HISTORY: Past Medical History:  Diagnosis Date  . Allergy   . Anemia   . Arthritis   . Asthma    stress related per pt  . Blood transfusion   . Breast cancer [facility]) 2011   Right  . Cancer ([place])    right  breast  . Complication of anesthesia    diff  waking up  . Cough   . Diabetes mellitus   . Family history of adverse reaction to anesthesia    " SISTER HAS DIFFICULTY WAKING "  . Generalized headaches   . History of kidney stones   . Hyperlipidemia   . Lung nodules   . Nasal congestion   . Neuromuscular disorder ([place])    neuropathy feet/legs/hands    PAST SURGICAL HISTORY: Past Surgical History:  Procedure Laterality Date  . BACK SURGERY    . BREAST SURGERY  06/2007   right mastectomy  . BREAST SURGERY  2011   left  . FOOT SURGERY    . KIDNEY STONE SURGERY    . LITHOTRIPSY    . MASTECTOMY Right 2011   with Radiaiton  . ORIF ELBOW FRACTURE Right 10/28/2016   Procedure: OPEN REDUCTION INTERNAL FIXATION (ORIF) ELBOW/OLECRANON FRACTURE;  Surgeon: Dorna Leitz, MD;  Location: [facility];  Service: Orthopedics;  Laterality: Right;  . REDUCTION MAMMAPLASTY Left 2012  . SHOULDER ARTHROSCOPY     bilateral  . TOTAL KNEE ARTHROPLASTY    . VISCERAL ANGIOGRAPHY N/A 08/06/2016   Procedure: Visceral Angiography;  Surgeon: Algernon Huxley, MD;  Location: [facility];  Service: Cardiovascular;  Laterality: N/A;  . VISCERAL ARTERY INTERVENTION N/A 08/06/2016   Procedure: Visceral Artery Intervention;  Surgeon: Algernon Huxley, MD;  Location: [facility];  Service: Cardiovascular;  Laterality: N/A;    FAMILY HISTORY Family History  Problem Relation Age of Onset  . Osteoporosis Mother   .  Heart disease Father   . Cancer Brother   . Breast cancer Brother   . Heart disease Sister        ADVANCED DIRECTIVES:    HEALTH MAINTENANCE: Social History   Tobacco Use  . Smoking status: Former Smoker    Last attempt to quit: 03/15/1984    Years since quitting: 33.3  . Smokeless tobacco: Never Used  Substance Use Topics  . Alcohol use: Yes    Comment: occ  . Drug use: No     Colonoscopy:  PAP:  Bone density:  Lipid panel:  Allergies  Allergen Reactions  . Ciprofloxacin  Diarrhea and Swelling  . Codeine Sulfate Itching    Current Outpatient Medications  Medication Sig Dispense Refill  . acetaminophen (TYLENOL) 500 MG tablet Take 500 mg by mouth daily.     Marland Kitchen aspirin 81 MG tablet Take 81 mg by mouth at bedtime.     Marland Kitchen atorvastatin (LIPITOR) 80 MG tablet TAKE 1 TABLET BY MOUTH ONCE DAILY    . bisacodyl (DULCOLAX) 5 MG EC tablet Take 1 tablet (5 mg total) by mouth 2 (two) times daily. (Patient taking differently: Take 10 mg by mouth at bedtime. ) 14 tablet 0  . Continuous Blood Gluc Sensor (FREESTYLE LIBRE SENSOR SYSTEM) MISC 1 Device by Does not apply route every 14 (fourteen) days. [id number] 70 each 3  . CRESTOR 40 MG tablet Take 40 mg by mouth at bedtime.     . Cyanocobalamin (B-12 TR) 2000 MCG TBCR Take by mouth.    . diclofenac sodium (VOLTAREN) 1 % GEL Apply topically.    . DULoxetine (CYMBALTA) 60 MG capsule TAKE ONE CAPSULE BY MOUTH ONCE DAILY 30 capsule 2  . gabapentin (NEURONTIN) 600 MG tablet TAKE ONE TABLET BY MOUTH THREE TIMES DAILY 90 tablet 5  . glucose blood (FREESTYLE LITE) test strip Use to test blood sugar 3 times daily dx code. E11.9 100 each 5  . HYDROcodone-acetaminophen (NORCO) 10-325 MG tablet Take 1 tablet by mouth 3 (three) times daily as needed for severe pain. 100 tablet 0  . hydroxypropyl methylcellulose / hypromellose (ISOPTO TEARS / GONIOVISC) 2.5 % ophthalmic solution Place 1 drop into both eyes 3 (three) times daily as needed for dry eyes.    . insulin NPH Human (NOVOLIN N RELION) 100 UNIT/ML injection Inject 55 units before breakfast and 20 units at bedtime 20 mL 3  . insulin regular (NOVOLIN R RELION) 100 units/mL injection Use 20 units in the morning and 30 units at supper 20 mL 3  . MEGARED OMEGA-3 KRILL OIL 500 MG CAPS Take by mouth. Take one daily    . mirtazapine (REMERON) 7.5 MG tablet Take 7.5 mg by mouth.    . montelukast (SINGULAIR) 10 MG tablet Take 10 mg by mouth at bedtime.     . propranolol (INDERAL) 40 MG  tablet Take 40 mg by mouth at bedtime.    . traMADol (ULTRAM) 50 MG tablet Take 50 mg by mouth.     No current facility-administered medications for this visit.     OBJECTIVE: There were no vitals filed for this visit.   There is no height or weight on file to calculate BMI.    ECOG FS:1 - Symptomatic but completely ambulatory  General: Well-developed, well-nourished, no acute distress. Eyes: Pink conjunctiva, anicteric sclera. Lungs: Scattered wheezing throughout. Heart: Regular rate and rhythm. No rubs, murmurs, or gallops. Abdomen: Soft, nontender, nondistended. No organomegaly noted, normoactive bowel sounds. Musculoskeletal: No edema, cyanosis,  or clubbing. Neuro: Alert, answering all questions appropriately. Cranial nerves grossly intact. Skin: No rashes or petechiae noted. Psych: Normal affect.  LAB RESULTS:  Lab Results  Component Value Date   NA 133 (L) 06/01/2017   K 4.7 06/01/2017   CL 100 06/01/2017   CO2 24 06/01/2017   GLUCOSE 424 (H) 06/01/2017   BUN 28 (H) 06/01/2017   CREATININE 1.07 06/01/2017   CALCIUM 8.6 06/01/2017   PROT 6.5 06/01/2017   ALBUMIN 3.5 06/01/2017   AST 18 06/01/2017   ALT 14 06/01/2017   ALKPHOS 97 06/01/2017   BILITOT 0.3 06/01/2017   GFRNONAA 48 (L) 10/28/2016   GFRAA 55 (L) 10/28/2016    Lab Results  Component Value Date   WBC 6.6 06/22/2017   NEUTROABS 5.0 06/22/2017   HGB 9.2 (L) 06/22/2017   HCT 29.8 (L) 06/22/2017   MCV 78.7 (L) 06/22/2017   PLT 298 06/22/2017   Lab Results  Component Value Date   LABCA2 26.0 12/02/2015     STUDIES: No results found.  ASSESSMENT: Anemia associated with acute blood loss. Left lower lobe lung mass, history of breast cancer.  PLAN:    1. Anemia associated with acute blood loss: Patient's hemoglobin has improved and is now 9.2. The etiology is unclear, but thought to be secondary to chronic GI blood loss.  She does not require additional blood today, but will proceed with 510 mg IV  Feraheme.  Return to clinic in 1 week for a second infusion.  Patient will then return to clinic in 1 month for repeat laboratory work and further evaluation.   2. Left lower lobe lung mass: CT results from February 15, 2017 reviewed independently with left lower lobe nodule unchanged from previous.  She was noted to have slowly progressive mediastinal and hilar adenopathy of indeterminate etiology.  No intervention is needed at this time.  Consider repeat CT scan in March 2019 to assess for any interval change of her lymphadenopathy.   3. History of breast cancer: Continue mammograms as ordered. CA-[id number] is within normal limits.  4. Hyperglycemia: Continue current diabetic medications.  Approximately 30 minutes spent in discussion of which greater than 50% was consultation.   Patient expressed understanding and was in agreement with this plan. She also understands that She can call clinic at any time with any questions, concerns, or complaints.    Lloyd Huger, MD   07/24/2017 9:30 AM

## 2017-07-26 ENCOUNTER — Telehealth: Payer: Self-pay | Admitting: *Deleted

## 2017-07-26 NOTE — Telephone Encounter (Signed)
Patient called to cancel appointments for lab md and infusion tomorrow due to husband being moved to a new facility. Requests that scheduling contact her the end of next week to reschedule.

## 2017-07-27 ENCOUNTER — Ambulatory Visit: Payer: Medicare Other | Admitting: Oncology

## 2017-07-27 ENCOUNTER — Other Ambulatory Visit: Payer: Medicare Other

## 2017-07-27 ENCOUNTER — Ambulatory Visit: Payer: Medicare Other

## 2017-07-29 ENCOUNTER — Ambulatory Visit: Payer: Medicare Other | Admitting: Endocrinology

## 2017-07-29 DIAGNOSIS — Z0289 Encounter for other administrative examinations: Secondary | ICD-10-CM

## 2017-08-06 ENCOUNTER — Telehealth: Payer: Self-pay | Admitting: Endocrinology

## 2017-08-06 NOTE — Telephone Encounter (Signed)
-----   Message from Brunilda Payor sent at 08/03/2017  4:15 PM EDT ----- Regarding: no show fee Patient is asking if no show fee can be waved, she stated she has been going through a lot with her husband, he has been falling so much, are putting him in a nursing home now. She said she can't afford it. :(

## 2017-08-06 NOTE — Telephone Encounter (Signed)
Request has been sent to charge correction to remove the fee for her.

## 2017-08-16 ENCOUNTER — Ambulatory Visit (INDEPENDENT_AMBULATORY_CARE_PROVIDER_SITE_OTHER): Payer: Medicare Other | Admitting: Endocrinology

## 2017-08-16 ENCOUNTER — Encounter: Payer: Self-pay | Admitting: Endocrinology

## 2017-08-16 VITALS — BP 140/80 | HR 102 | Ht 63.0 in | Wt 145.0 lb

## 2017-08-16 DIAGNOSIS — M792 Neuralgia and neuritis, unspecified: Secondary | ICD-10-CM

## 2017-08-16 DIAGNOSIS — E1165 Type 2 diabetes mellitus with hyperglycemia: Secondary | ICD-10-CM | POA: Diagnosis not present

## 2017-08-16 DIAGNOSIS — Z794 Long term (current) use of insulin: Secondary | ICD-10-CM | POA: Diagnosis not present

## 2017-08-16 DIAGNOSIS — E042 Nontoxic multinodular goiter: Secondary | ICD-10-CM

## 2017-08-16 LAB — TSH: TSH: 2.79 u[IU]/mL (ref 0.35–4.50)

## 2017-08-16 LAB — COMPREHENSIVE METABOLIC PANEL
ALBUMIN: 3.1 g/dL — AB (ref 3.5–5.2)
ALT: 15 U/L (ref 0–35)
AST: 19 U/L (ref 0–37)
Alkaline Phosphatase: 108 U/L (ref 39–117)
BILIRUBIN TOTAL: 0.2 mg/dL (ref 0.2–1.2)
BUN: 34 mg/dL — AB (ref 6–23)
CALCIUM: 9.1 mg/dL (ref 8.4–10.5)
CO2: 25 meq/L (ref 19–32)
CREATININE: 1.19 mg/dL (ref 0.40–1.20)
Chloride: 105 mEq/L (ref 96–112)
GFR: 45.98 mL/min — ABNORMAL LOW (ref >60.00)
Glucose, Bld: 159 mg/dL — ABNORMAL HIGH (ref 70–99)
Potassium: 4 mEq/L (ref 3.5–5.1)
SODIUM: 138 meq/L (ref 135–145)
Total Protein: 7 g/dL (ref 6.0–8.3)

## 2017-08-16 LAB — POCT GLYCOSYLATED HEMOGLOBIN (HGB A1C): HEMOGLOBIN A1C: 10.6

## 2017-08-16 MED ORDER — HYDROCODONE-ACETAMINOPHEN 10-325 MG PO TABS: 1.0000 | ORAL_TABLET | Freq: Three times a day (TID) | ORAL | 0 refills | Status: DC | PRN

## 2017-08-16 MED ORDER — FREESTYLE LIBRE 14 DAY READER DEVI: 1.0000 | Freq: Once | 0 refills | Status: AC

## 2017-08-16 MED ORDER — FREESTYLE LIBRE 14 DAY SENSOR MISC: 1.0000 [IU] | 4 refills | Status: DC

## 2017-08-16 NOTE — Patient Instructions (Addendum)
Insulin regimen:    Novolin N 50 in a.m, REGULAR insulin 20 before  Breakfast and 20 before lunch if eating a meal   REGULAR insulin 30  BEFORE SUPPER    Novolin N 15 at bedtime  Must take insulin before meals even when out  Keep record of all insulin doses

## 2017-08-16 NOTE — Progress Notes (Signed)
Patient ID: Andrea Wallace, female   DOB: 12/01/1931, 84 y.o.   MRN: [id number]   Reason for Appointment: Diabetes follow-up   History of Present Illness   Diagnosis: Type 2 DIABETES MELITUS, date of diagnosis 1999  Previous history: She has been on insulin for several years to control her diabetes and usually requires large doses Has been on basal bolus regimen for the last few years, taking Lantus twice a day.  Her blood sugars tend to fluctuate significantly but her A1c is usually at a reasonable level She does not clearly benefit from GLP-1 drugs and had been taking Byetta to help with postprandial hyperglycemia, this has been stopped  She had been tried on Victoza, Byetta and Invokana previously and these were  stopped because of unclear benefit       RECENT history:  Insulin regimen:    Novolin N 50 in a.m, 25 at bedtime REGULAR insulin 20 at breakfast, 30  BEFORE SUPPER   Her A1c is now 10.6, previously 9.7  Current management, blood sugar patterns and problems identified:  She was told to keep a record of her insulin doses in the diary and she has not done that  She has been having very erratic blood sugars including blood sugars as low as 31 early morning and a few readings over 500 also in the morning  She says she has been preoccupied with her husband going in a nursing home and not clear if she is taking her insulin as directed  Also checking blood sugars somewhat infrequently, she is not liking checking her blood sugars with fingersticks  She is checking some blood sugars in the evenings and some of these may be right after eating  Most of her sugars in the evenings are very high except for one reading; also not clear if her sugars are high before or after eating in the evening  Her weight has gone back down  She does not adjust her insulin based on what she is eating  She is also sometimes eating lunch when she is at the nursing home with her husband  but did not take insulin at this time  Also not clear if she is eating without taking her insulin in the evenings  Monitors blood glucose:  2-3 times a day on average.    Glucometer: Freestyle          Blood sugar readings by monitor download, using FreeStyle meter   Mean values apply above for all meters except median for One Touch  PRE-MEAL Fasting Lunch  6-7 PM Bedtime Overall  Glucose range:  31-500  44, 117  163-500  450, 500   Mean/median:     269   POST-MEAL PC Breakfast PC Lunch PC Dinner  Glucose range:   244, 267   Mean/median:        Oral hypoglycemic drugs: None     Side effects from medications: None  Proper timing of medications in relation to meals: Yes.          Meals: 3 meals per day.   at breakfast she is eating a Half bagel.  Mostly eating cheese crackers or yogurt at lunch and full meal at dinner Usually avoiding  drinks with sugar including juices          Physical activity: exercise: none, has back and leg pain            Dietician visit: Most recent: Years ago  Complications: are: Neuropathy, microalbuminuria     Wt Readings from Last 3 Encounters:  08/16/17 145 lb (65.8 kg)  06/22/17 148 lb 14.4 oz (67.5 kg)  06/08/17 152 lb 11.2 oz (69.3 kg)     Lab Results  Component Value Date   HGBA1C 10.6 08/16/2017   HGBA1C 9.7 (H) 03/30/2017   HGBA1C 10.2 01/12/2017   Lab Results  Component Value Date   MICROALBUR 94.4 (H) 03/30/2017   LDLCALC 32 12/05/2013   CREATININE 1.07 06/01/2017    OTHER problems discussed today: See review of systems   Allergies as of 08/16/2017      Reactions   Ciprofloxacin Diarrhea, Swelling   Codeine Sulfate Itching      Medication List        Accurate as of 08/16/17 10:12 AM. Always use your most recent med list.          acetaminophen 500 MG tablet Commonly known as:  TYLENOL Take 500 mg by mouth daily.   aspirin 81 MG tablet Take 81 mg by mouth at bedtime.   atorvastatin 80 MG  tablet Commonly known as:  LIPITOR TAKE 1 TABLET BY MOUTH ONCE DAILY   B-12 TR 2000 MCG Tbcr Generic drug:  Cyanocobalamin Take by mouth.   bisacodyl 5 MG EC tablet Commonly known as:  DULCOLAX Take 1 tablet (5 mg total) by mouth 2 (two) times daily.   CRESTOR 40 MG tablet Generic drug:  rosuvastatin Take 40 mg by mouth at bedtime.   diclofenac sodium 1 % Gel Commonly known as:  VOLTAREN Apply topically.   DULoxetine 60 MG capsule Commonly known as:  CYMBALTA TAKE ONE CAPSULE BY MOUTH ONCE DAILY   FREESTYLE LIBRE 14 DAY READER Devi 1 Device by Does not apply route once for 1 dose.   FREESTYLE LIBRE 14 DAY SENSOR Misc 1 Units by Does not apply route every 14 (fourteen) days.   gabapentin 600 MG tablet Commonly known as:  NEURONTIN TAKE ONE TABLET BY MOUTH THREE TIMES DAILY   glucose blood test strip Commonly known as:  FREESTYLE LITE Use to test blood sugar 3 times daily dx code. E11.9   HYDROcodone-acetaminophen 10-325 MG tablet Commonly known as:  NORCO Take 1 tablet by mouth 3 (three) times daily as needed for severe pain.   hydroxypropyl methylcellulose / hypromellose 2.5 % ophthalmic solution Commonly known as:  ISOPTO TEARS / GONIOVISC Place 1 drop into both eyes 3 (three) times daily as needed for dry eyes.   insulin NPH Human 100 UNIT/ML injection Commonly known as:  NOVOLIN N RELION Inject 55 units before breakfast and 20 units at bedtime   insulin regular 100 units/mL injection Commonly known as:  NOVOLIN R RELION Use 20 units in the morning and 30 units at supper   MEGARED OMEGA-3 KRILL OIL 500 MG Caps Take by mouth. Take one daily   mirtazapine 7.5 MG tablet Commonly known as:  REMERON Take 7.5 mg by mouth.   montelukast 10 MG tablet Commonly known as:  SINGULAIR Take 10 mg by mouth at bedtime.   propranolol 40 MG tablet Commonly known as:  INDERAL Take 40 mg by mouth at bedtime.   traMADol 50 MG tablet Commonly known as:  ULTRAM Take  50 mg by mouth.       Allergies:  Allergies  Allergen Reactions  . Ciprofloxacin Diarrhea and Swelling  . Codeine Sulfate Itching    Past Medical History:  Diagnosis Date  . Allergy   . Anemia   .  Arthritis   . Asthma    stress related per pt  . Blood transfusion   . Breast cancer [facility]) 2011   Right  . Cancer ([place])    right breast  . Complication of anesthesia    diff  waking up  . Cough   . Diabetes mellitus   . Family history of adverse reaction to anesthesia    " SISTER HAS DIFFICULTY WAKING "  . Generalized headaches   . History of kidney stones   . Hyperlipidemia   . Lung nodules   . Nasal congestion   . Neuromuscular disorder ([place])    neuropathy feet/legs/hands    Past Surgical History:  Procedure Laterality Date  . BACK SURGERY    . BREAST SURGERY  06/2007   right mastectomy  . BREAST SURGERY  2011   left  . FOOT SURGERY    . KIDNEY STONE SURGERY    . LITHOTRIPSY    . MASTECTOMY Right 2011   with Radiaiton  . ORIF ELBOW FRACTURE Right 10/28/2016   Procedure: OPEN REDUCTION INTERNAL FIXATION (ORIF) ELBOW/OLECRANON FRACTURE;  Surgeon: Dorna Leitz, MD;  Location: [place];  Service: Orthopedics;  Laterality: Right;  . REDUCTION MAMMAPLASTY Left 2012  . SHOULDER ARTHROSCOPY     bilateral  . TOTAL KNEE ARTHROPLASTY    . VISCERAL ANGIOGRAPHY N/A 08/06/2016   Procedure: Visceral Angiography;  Surgeon: Algernon Huxley, MD;  Location: [place] CV LAB;  Service: Cardiovascular;  Laterality: N/A;  . VISCERAL ARTERY INTERVENTION N/A 08/06/2016   Procedure: Visceral Artery Intervention;  Surgeon: Algernon Huxley, MD;  Location: [place] CV LAB;  Service: Cardiovascular;  Laterality: N/A;    Family History  Problem Relation Age of Onset  . Osteoporosis Mother   . Heart disease Father   . Cancer Brother   . Breast cancer Brother   . Heart disease Sister     Social History:  reports that she quit smoking about 33 years ago. She has never used smokeless  tobacco. She reports that she drinks alcohol. She reports that she does not use drugs.  Review of Systems:   Chronic kidney disease: Creatinine has been  upper normal,  followed periodically by nephrologist Last urine microalbumin was high, needs follow-up  Again not on any medications or ARB drugs   Lab Results  Component Value Date   CREATININE 1.07 06/01/2017    HYPERLIPIDEMIA: The lipid abnormality consists of elevated  triglycerides, has been treated with TriCor Also followed by PCP Last triglycerides from PCP: 254, not checked recently    Lab Results  Component Value Date   CHOL 212 (H) 03/30/2017   HDL 35.50 (L) 03/30/2017   LDLCALC 32 12/05/2013   LDLDIRECT 116.0 03/30/2017   TRIG 349.0 (H) 03/30/2017   CHOLHDL 6 03/30/2017     She has chronic pains and paresthesia in her feet and lower legs Also has had Chronic back pain  She is taking her Cymbalta    Also on gabapentin 600 mg   Refill on hydrocodone was given in August, this was given again She says that she mostly takes this at bedtime and occasionally when she is going out and being active during the day  Last diabetic foot exam was in 8/17 during some sensory loss  She has had a multinodular goiter, this has been euthyroid, Last TSH normal in 2/18 from PCP  Lab Results  Component Value Date   TSH 1.06 03/30/2017  Examination:   BP 140/80 (BP Location: Left Arm, Patient Position: Sitting, Cuff Size: Normal)   Pulse (!) 102   Ht 5\' 3"  (1.6 m)   Wt 145 lb (65.8 kg)   SpO2 98%   BMI 25.69 kg/m   Body mass index is 25.69 kg/m.     ASSESSMENT/ PLAN:   Diabetes type 2:  See history of present illness for detailed discussion of  current management, blood sugar patterns and problems identified  Her blood sugars are about the same despite increasing her insulin in the morning and reducing the bedtime dose She usually prefers to have Generic or inexpensive medications and although she  may do better with U-500 insulin she probably cannot afford this  She has unusually high readings around suppertime, some of these may be right after eating also This is despite her being fairly compliant with the morning injection and increasing her NPH by 10 units on the last visit Her diet has been fairly good and she is not getting excessive carbohydrate or drinks with sugar   Recommendations:  Will need to have her take insulin doses consistently  If she is eating away from home including at the lunch meal she will need to take regular insulin before eating consistently  Given written instructions for insulin  She will need to keep a record of her insulin doses that she is taking and bring this to her next visit  Discussed timing of checking blood sugars  She will need to reduce her bedtime dose of NPH down to 15 units since she has a tendency to overnight hypoglycemia  To check blood sugars more consistently at different times per day, up to 4 times a day  She will be given a prescription for the [facility] sensor and also discussed Medicare guidelines for use  Microalbuminuria: Will recheck today  100 tablets of hydrocodone given as requested for pain  Patient Instructions  Insulin regimen:    Novolin N 50 in a.m, REGULAR insulin 20 before  Breakfast and 20 before lunch if eating a meal   REGULAR insulin 30  BEFORE SUPPER    Novolin N 15 at bedtime  Must take insulin before meals even when out  Keep record of all insulin doses     Counseling time on subjects discussed in assessment and plan sections is over 50% of today's 25 minute visit    Elayne Snare 08/16/2017, 10:12 AM      Note: This office note was prepared with Dragon voice recognition system technology. Any transcriptional errors that result from this process are unintentional.             Patient ID: Andrea Wallace, female   DOB: [birth year], 84 y.o.   MRN: [id number]   Reason for  Appointment: Diabetes follow-up   History of Present Illness   Diagnosis: Type 2 DIABETES MELITUS, date of diagnosis 1999  Previous history: She has been on insulin for several years to control her diabetes and usually requires large doses Has been on basal bolus regimen for the last few years, taking Lantus twice a day.  Her blood sugars tend to fluctuate significantly but her A1c is usually at a reasonable level She does not clearly benefit from GLP-1 drugs and had been taking Byetta to help with postprandial hyperglycemia, this has been stopped  She had been tried on Victoza, Byetta and Invokana previously and these were  stopped because of unclear benefit       RECENT  history:  Insulin regimen:    Novolin N 50 in a.m, 25 at bedtime REGULAR insulin 20 at breakfast, 30  BEFORE SUPPER    Her A1c has been 10.2 recently, generally around 7-8%  Current management, blood sugar patterns and problems identified:  She appears to have the same pattern of blood sugars before despite adjusting her insulin  Difficult to know why she has fluctuation in her blood sugars especially in the mornings even though she thinks she is trying to take her insulin regularly most of the time  She was given a diary to keep her blood sugar records and her insulin doses but not clear if she is doing this and has not brought this today  More recently her FASTING blood sugars have been closer to normal and not as consistently high; however it was 31 about 3 days ago and 321 the next day in the morning  Blood sugars are fairly consistently high in the evenings with only a couple of readings below 200  Difficult to know whether she is checking her sugar before or after eating in the evening but appears to be checking it either right before, during our soon after her meals around 6-8 PM  No readings at bedtime  HYPOGLYCEMIA has been documented only once  Again blood sugars are higher than expected at  suppertime despite her not eating much at lunchtime  Her diet has been about the same as also her weight  She will has been on metformin before and appears to be not taking it now  Monitors blood glucose:  2-3 times a day on average.    Glucometer: Freestyle          Blood sugar readings by monitor download, using FreeStyle meter  Mean values apply above for all meters except median for One Touch  PRE-MEAL Fasting Lunch Dinner Bedtime Overall  Glucose range: 31-366 ?  256  129-424    Mean/median: 200   320   238    Oral hypoglycemic drugs: Metformin ER, 500 mg 2 a day       Side effects from medications: None  Proper timing of medications in relation to meals: Yes.          Meals: 3 meals per day.   at breakfast she is eating a Half bagel.  Mostly eating cheese crackers or yogurt at lunch and full meal at dinner Usually avoiding all drinks with sugar including juices          Physical activity: exercise: none, has back pain            Dietician visit: Most recent: Years ago       Complications: are: Neuropathy, microalbuminuria     Wt Readings from Last 3 Encounters:  08/16/17 145 lb (65.8 kg)  06/22/17 148 lb 14.4 oz (67.5 kg)  06/08/17 152 lb 11.2 oz (69.3 kg)     Lab Results  Component Value Date   HGBA1C 10.6 08/16/2017   HGBA1C 9.7 (H) 03/30/2017   HGBA1C 10.2 01/12/2017   Lab Results  Component Value Date   MICROALBUR 94.4 (H) 03/30/2017   LDLCALC 32 12/05/2013   CREATININE 1.07 06/01/2017    OTHER problems discussed today: See review of systems   Allergies as of 08/16/2017      Reactions   Ciprofloxacin Diarrhea, Swelling   Codeine Sulfate Itching      Medication List        Accurate as of 08/16/17 10:12 AM. Always  use your most recent med list.          acetaminophen 500 MG tablet Commonly known as:  TYLENOL Take 500 mg by mouth daily.   aspirin 81 MG tablet Take 81 mg by mouth at bedtime.   atorvastatin 80 MG tablet Commonly known as:   LIPITOR TAKE 1 TABLET BY MOUTH ONCE DAILY   B-12 TR 2000 MCG Tbcr Generic drug:  Cyanocobalamin Take by mouth.   bisacodyl 5 MG EC tablet Commonly known as:  DULCOLAX Take 1 tablet (5 mg total) by mouth 2 (two) times daily.   CRESTOR 40 MG tablet Generic drug:  rosuvastatin Take 40 mg by mouth at bedtime.   diclofenac sodium 1 % Gel Commonly known as:  VOLTAREN Apply topically.   DULoxetine 60 MG capsule Commonly known as:  CYMBALTA TAKE ONE CAPSULE BY MOUTH ONCE DAILY   FREESTYLE LIBRE 14 DAY READER Devi 1 Device by Does not apply route once for 1 dose.   FREESTYLE LIBRE 14 DAY SENSOR Misc 1 Units by Does not apply route every 14 (fourteen) days.   gabapentin 600 MG tablet Commonly known as:  NEURONTIN TAKE ONE TABLET BY MOUTH THREE TIMES DAILY   glucose blood test strip Commonly known as:  FREESTYLE LITE Use to test blood sugar 3 times daily dx code. E11.9   HYDROcodone-acetaminophen 10-325 MG tablet Commonly known as:  NORCO Take 1 tablet by mouth 3 (three) times daily as needed for severe pain.   hydroxypropyl methylcellulose / hypromellose 2.5 % ophthalmic solution Commonly known as:  ISOPTO TEARS / GONIOVISC Place 1 drop into both eyes 3 (three) times daily as needed for dry eyes.   insulin NPH Human 100 UNIT/ML injection Commonly known as:  NOVOLIN N RELION Inject 55 units before breakfast and 20 units at bedtime   insulin regular 100 units/mL injection Commonly known as:  NOVOLIN R RELION Use 20 units in the morning and 30 units at supper   MEGARED OMEGA-3 KRILL OIL 500 MG Caps Take by mouth. Take one daily   mirtazapine 7.5 MG tablet Commonly known as:  REMERON Take 7.5 mg by mouth.   montelukast 10 MG tablet Commonly known as:  SINGULAIR Take 10 mg by mouth at bedtime.   propranolol 40 MG tablet Commonly known as:  INDERAL Take 40 mg by mouth at bedtime.   traMADol 50 MG tablet Commonly known as:  ULTRAM Take 50 mg by mouth.        Allergies:  Allergies  Allergen Reactions  . Ciprofloxacin Diarrhea and Swelling  . Codeine Sulfate Itching    Past Medical History:  Diagnosis Date  . Allergy   . Anemia   . Arthritis   . Asthma    stress related per pt  . Blood transfusion   . Breast cancer [facility]) 2011   Right  . Cancer ([place])    right breast  . Complication of anesthesia    diff  waking up  . Cough   . Diabetes mellitus   . Family history of adverse reaction to anesthesia    " SISTER HAS DIFFICULTY WAKING "  . Generalized headaches   . History of kidney stones   . Hyperlipidemia   . Lung nodules   . Nasal congestion   . Neuromuscular disorder ([place])    neuropathy feet/legs/hands    Past Surgical History:  Procedure Laterality Date  . BACK SURGERY    . BREAST SURGERY  06/2007   right mastectomy  .  BREAST SURGERY  2011   left  . FOOT SURGERY    . KIDNEY STONE SURGERY    . LITHOTRIPSY    . MASTECTOMY Right 2011   with Radiaiton  . ORIF ELBOW FRACTURE Right 10/28/2016   Procedure: OPEN REDUCTION INTERNAL FIXATION (ORIF) ELBOW/OLECRANON FRACTURE;  Surgeon: Dorna Leitz, MD;  Location: [place];  Service: Orthopedics;  Laterality: Right;  . REDUCTION MAMMAPLASTY Left 2012  . SHOULDER ARTHROSCOPY     bilateral  . TOTAL KNEE ARTHROPLASTY    . VISCERAL ANGIOGRAPHY N/A 08/06/2016   Procedure: Visceral Angiography;  Surgeon: Algernon Huxley, MD;  Location: [place] CV LAB;  Service: Cardiovascular;  Laterality: N/A;  . VISCERAL ARTERY INTERVENTION N/A 08/06/2016   Procedure: Visceral Artery Intervention;  Surgeon: Algernon Huxley, MD;  Location: [place] CV LAB;  Service: Cardiovascular;  Laterality: N/A;    Family History  Problem Relation Age of Onset  . Osteoporosis Mother   . Heart disease Father   . Cancer Brother   . Breast cancer Brother   . Heart disease Sister     Social History:  reports that she quit smoking about 33 years ago. She has never used smokeless tobacco. She reports that  she drinks alcohol. She reports that she does not use drugs.  Review of Systems:   Chronic kidney disease: Creatinine has been  upper normal Last urine microalbumin was high,     Lab Results  Component Value Date   CREATININE 1.07 06/01/2017     HYPERLIPIDEMIA: The lipid abnormality consists of elevated  triglycerides, has been treated with TriCor Also followed by PCP   Lab Results  Component Value Date   CHOL 212 (H) 03/30/2017   HDL 35.50 (L) 03/30/2017   LDLCALC 32 12/05/2013   LDLDIRECT 116.0 03/30/2017   TRIG 349.0 (H) 03/30/2017   CHOLHDL 6 03/30/2017     She has chronic pains and paresthesia in her feet and lower legs Also has had chronic significant back pain  She is taking her Cymbalta along with gabapentin 600 mg   Refill on hydrocodone for her peripheral neuropathy was given in January 19 with 100 tablets and another 100 tablets given on a written prescription today  Last diabetic foot exam was in 8/17 during some sensory loss  She has had a multinodular goiter, this has been euthyroid, Last TSH normal in 10/18 from PCP  Lab Results  Component Value Date   TSH 1.06 03/30/2017        Examination:   BP 140/80 (BP Location: Left Arm, Patient Position: Sitting, Cuff Size: Normal)   Pulse (!) 102   Ht 5\' 3"  (1.6 m)   Wt 145 lb (65.8 kg)   SpO2 98%   BMI 25.69 kg/m   Body mass index is 25.69 kg/m.     ASSESSMENT/ PLAN:   Diabetes type 2 on insulin:  See history of present illness for detailed discussion of  current management, blood sugar patterns and problems identified  Her A1c is 10.6 and has been about the same for the last 3 visits  She has been totally erratic with her insulin regimen and not clear if she remembers to do the insulin doses as directed She has been preoccupied with taking care of her husband at the nursing home with a regular mealtimes and likely not taking her regular on NPH insulin as directed She is still unclear  about how much she is taking and when though she thinks she is  following instructions from previous visit She does not keep a record  Explained to the patient that since her blood sugars fluctuate between 31-500 even in the morning she is unlikely to be taking her insulin doses as directed Also may not be taking her insulin doses on time including evening insulin; last night she had a low blood sugar around 1 AM She does give conflicting answers about whether she took her insulin yesterday evening or today morning on time  New regimen was explained to her and written instructions given, see below Given her logbook to write down her insulin doses and Continues to be consistent with taking her regular insulin before eating Also the need to take extra doses for eating another meal at lunch, to take insulin with her when she is not at home Will reduce her bedtime dose further to avoid potential low sugars overnight She is reluctant to consider any kind of insulin pump and other insulin drugs are too expensive for her  FATIGUE and history of goiter: Check TSH along with her other labs today  Will need follow-up in 4 weeks to make sure she is on a proper regimen  100 tablets of hydrocodone given as requested for pain, urine drug screen to be done for pain management purposes   Patient Instructions  Insulin regimen:    Novolin N 50 in a.m, REGULAR insulin 20 before  Breakfast and 20 before lunch if eating a meal   REGULAR insulin 30  BEFORE SUPPER    Novolin N 15 at bedtime  Must take insulin before meals even when out  Keep record of all insulin doses      Counseling time on subjects discussed in assessment and plan sections is over 50% of today's 25 minute visit    Elayne Snare 08/16/2017, 10:12 AM      Note: This office note was prepared with Dragon voice recognition system technology. Any transcriptional errors that result from this process are unintentional.

## 2017-08-31 ENCOUNTER — Telehealth: Payer: Self-pay | Admitting: Endocrinology

## 2017-08-31 NOTE — Telephone Encounter (Signed)
Lisa from [facility] in [place] statted patient b/s has been fluctuating,  it is 50 right now, she is asking for a call back because she think patient is on to much medication. Please advise  P# [phone]

## 2017-08-31 NOTE — Telephone Encounter (Signed)
Please reduce the NPH insulin to 40 units qam and 10 units qpm.  Also, please reduce the reg to 10 units with breakfast, and 20 units with supper. Please let us know if there anything you can think of that would cause the lows, such as illness or decreased appetite.   Please call or message [place] next week, to tell us how the blood sugar is doing

## 2017-08-31 NOTE — Telephone Encounter (Signed)
Attempted to call Andrea Wallace but no answer.

## 2017-08-31 NOTE — Telephone Encounter (Signed)
Please advise in Dr. Ronnie Derby absence.

## 2017-09-02 ENCOUNTER — Telehealth: Payer: Self-pay | Admitting: Endocrinology

## 2017-09-02 ENCOUNTER — Other Ambulatory Visit: Payer: Self-pay

## 2017-09-02 MED ORDER — INSULIN NPH (HUMAN) (ISOPHANE) 100 UNIT/ML ~~LOC~~ SUSP: SUBCUTANEOUS | 3 refills | Status: AC

## 2017-09-02 MED ORDER — INSULIN REGULAR HUMAN 100 UNIT/ML IJ SOLN: INTRAMUSCULAR | 3 refills | Status: AC

## 2017-09-02 NOTE — Telephone Encounter (Signed)
Please find out exactly when her blood sugar is getting low and what dose of insulin she is taking.  Also would like to get blood sugar readings for the last 3 days

## 2017-09-02 NOTE — Telephone Encounter (Signed)
I have spoken with [facility] gave them the dosage changes advised by Dr. Loanne Drilling in Dr. Ronnie Derby absence. I am faxing over new prescriptions because they cannot take verbal orders.

## 2017-09-02 NOTE — Telephone Encounter (Signed)
Per [facility]-Per Physician/Provider/Hospital/Patient's blood sugar has been dropping, 55 units of novalin N, not eating well. Does Dr. Dwyane Dee want to put her on sliding scale of insulin. [name] from [place] ph# [phone]

## 2017-09-02 NOTE — Telephone Encounter (Signed)
Dr. Dwyane Dee, please advise.

## 2017-09-06 NOTE — Telephone Encounter (Signed)
Spoke with sherry at [facility], and she states that this has been handled.

## 2017-09-17 ENCOUNTER — Ambulatory Visit: Payer: Medicare Other | Admitting: Endocrinology

## 2017-09-21 ENCOUNTER — Encounter (INDEPENDENT_AMBULATORY_CARE_PROVIDER_SITE_OTHER): Payer: Medicare Other

## 2017-09-21 ENCOUNTER — Ambulatory Visit (INDEPENDENT_AMBULATORY_CARE_PROVIDER_SITE_OTHER): Payer: Medicare Other | Admitting: Vascular Surgery

## 2017-09-24 NOTE — Progress Notes (Signed)
[place]  [phone] [phone]  ID: Andrea Wallace OB: 01/10/1932  MR#: [id number]  GXQ#:[id number]  Patient Care Team: Kirk Ruths, MD as PCP - General (Unknown Physician Specialty)  CHIEF COMPLAINT: Anemia associated with acute blood loss. Left lower lobe lung mass, history of breast cancer.  INTERVAL HISTORY: Patient returns to clinic today for repeat laboratory work and further evaluation.  She continues to have chronic weakness and fatigue, but attributes this to her husband who is still having difficulty time recovering from a broken hip.  He also has a declining performance status as well as worsening memory.  She otherwise feels well.  She has no neurologic complaints. She denies any chest pain, cough, hemoptysis, or shortness of breath. She has no neurologic complaints. She denies any recent fevers or illnesses. She has a good appetite and denies any weight loss. She has no nausea, vomiting, constipation, or diarrhea.  She denies any melena or hematochezia.  She has no urinary complaints.  Patient offers no further specific complaints today.   REVIEW OF SYSTEMS:   Review of Systems  Constitutional: Positive for malaise/fatigue. Negative for fever and weight loss.  Respiratory: Negative.  Negative for cough, hemoptysis, sputum production and shortness of breath.   Cardiovascular: Negative.  Negative for chest pain and leg swelling.  Gastrointestinal: Negative.  Negative for abdominal pain, blood in stool and melena.  Genitourinary: Negative.   Musculoskeletal: Negative.   Skin: Negative.  Negative for rash.  Neurological: Positive for weakness. Negative for sensory change and focal weakness.  Psychiatric/Behavioral: The patient has insomnia. The patient is not nervous/anxious.     As per HPI. Otherwise, a complete review of systems is negative.  PAST MEDICAL HISTORY: Past Medical History:  Diagnosis Date  . Allergy   .  Anemia   . Arthritis   . Asthma    stress related per pt  . Blood transfusion   . Breast cancer [facility]) 2011   Right  . Cancer ([place])    right breast  . Complication of anesthesia    diff  waking up  . Cough   . Diabetes mellitus   . Family history of adverse reaction to anesthesia    " SISTER HAS DIFFICULTY WAKING "  . Generalized headaches   . History of kidney stones   . Hyperlipidemia   . Lung nodules   . Nasal congestion   . Neuromuscular disorder ([place]    neuropathy feet/legs/hands    PAST SURGICAL HISTORY: Past Surgical History:  Procedure Laterality Date  . BACK SURGERY    . BREAST SURGERY  06/2007   right mastectomy  . BREAST SURGERY  2011   left  . FOOT SURGERY    . KIDNEY STONE SURGERY    . LITHOTRIPSY    . MASTECTOMY Right 2011   with Radiaiton  . ORIF ELBOW FRACTURE Right 10/28/2016   Procedure: OPEN REDUCTION INTERNAL FIXATION (ORIF) ELBOW/OLECRANON FRACTURE;  Surgeon: Dorna Leitz, MD;  Location: [facility];  Service: Orthopedics;  Laterality: Right;  . REDUCTION MAMMAPLASTY Left 2012  . SHOULDER ARTHROSCOPY     bilateral  . TOTAL KNEE ARTHROPLASTY    . VISCERAL ANGIOGRAPHY N/A 08/06/2016   Procedure: Visceral Angiography;  Surgeon: Algernon Huxley, MD;  Location: [facility] CV [facility];  Service: Cardiovascular;  Laterality: N/A;  . VISCERAL ARTERY INTERVENTION N/A 08/06/2016   Procedure: Visceral Artery Intervention;  Surgeon: Algernon Huxley, MD;  Location: [place] CV [facility];  Service:  Cardiovascular;  Laterality: N/A;    FAMILY HISTORY Family History  Problem Relation Age of Onset  . Osteoporosis Mother   . Heart disease Father   . Cancer Brother   . Breast cancer Brother   . Heart disease Sister        ADVANCED DIRECTIVES:    HEALTH MAINTENANCE: Social History   Tobacco Use  . Smoking status: Former Smoker    Last attempt to quit: 03/15/1984    Years since quitting: 33.5  . Smokeless tobacco: Never Used  Substance Use Topics  . Alcohol use: Yes      Comment: occ  . Drug use: No     Colonoscopy:  PAP:  Bone density:  Lipid panel:  Allergies  Allergen Reactions  . Ciprofloxacin Diarrhea and Swelling  . Codeine Sulfate Itching    Current Outpatient Medications  Medication Sig Dispense Refill  . acetaminophen (TYLENOL) 500 MG tablet Take 500 mg by mouth daily.     Marland Kitchen aspirin 81 MG tablet Take 81 mg by mouth at bedtime.     Marland Kitchen atorvastatin (LIPITOR) 80 MG tablet TAKE 1 TABLET BY MOUTH ONCE DAILY    . bisacodyl (DULCOLAX) 5 MG EC tablet Take 1 tablet (5 mg total) by mouth 2 (two) times daily. (Patient taking differently: Take 10 mg by mouth at bedtime. ) 14 tablet 0  . Continuous Blood Gluc Sensor (FREESTYLE LIBRE 14 DAY SENSOR) MISC 1 Units by Does not apply route every 14 (fourteen) days. 2 each 4  . CRESTOR 40 MG tablet Take 40 mg by mouth at bedtime.     . Cyanocobalamin (B-12 TR) 2000 MCG TBCR Take by mouth.    . DULoxetine (CYMBALTA) 60 MG capsule TAKE ONE CAPSULE BY MOUTH ONCE DAILY 30 capsule 2  . gabapentin (NEURONTIN) 600 MG tablet TAKE ONE TABLET BY MOUTH THREE TIMES DAILY 90 tablet 5  . glucose blood (FREESTYLE LITE) test strip Use to test blood sugar 3 times daily dx code. E11.9 100 each 5  . HYDROcodone-acetaminophen (NORCO) 10-325 MG tablet Take 1 tablet by mouth 3 (three) times daily as needed for severe pain. 100 tablet 0  . hydroxypropyl methylcellulose / hypromellose (ISOPTO TEARS / GONIOVISC) 2.5 % ophthalmic solution Place 1 drop into both eyes 3 (three) times daily as needed for dry eyes.    . insulin NPH Human (NOVOLIN N RELION) 100 UNIT/ML injection Inject 40 units before breakfast and 10 units at bedtime 20 mL 3  . insulin regular (NOVOLIN R RELION) 100 units/mL injection Use 10 units in the morning and 20 units at supper 20 mL 3  . MEGARED OMEGA-3 KRILL OIL 500 MG CAPS Take by mouth. Take one daily    . mirtazapine (REMERON) 7.5 MG tablet Take 7.5 mg by mouth.    . montelukast (SINGULAIR) 10 MG tablet Take  10 mg by mouth at bedtime.     . propranolol (INDERAL) 40 MG tablet Take 40 mg by mouth at bedtime.    . traMADol (ULTRAM) 50 MG tablet Take 50 mg by mouth.     No current facility-administered medications for this visit.     OBJECTIVE: Vitals:   09/27/17 1057  BP: 106/64  Pulse: 64  Temp: (!) 96.2 F (35.7 C)     Body mass index is 26.64 kg/m.    ECOG FS:0 - Asymptomatic  General: Well-developed, well-nourished, no acute distress. Eyes: Pink conjunctiva, anicteric sclera. Lungs: Clear to auscultation bilaterally. Heart: Regular rate and rhythm. No  rubs, murmurs, or gallops. Abdomen: Soft, nontender, nondistended. No organomegaly noted, normoactive bowel sounds. Musculoskeletal: No edema, cyanosis, or clubbing. Neuro: Alert, answering all questions appropriately. Cranial nerves grossly intact. Skin: No rashes or petechiae noted. Psych: Normal affect.   LAB RESULTS:  Lab Results  Component Value Date   NA 138 08/16/2017   K 4.0 08/16/2017   CL 105 08/16/2017   CO2 25 08/16/2017   GLUCOSE 159 (H) 08/16/2017   BUN 34 (H) 08/16/2017   CREATININE 1.19 08/16/2017   CALCIUM 9.1 08/16/2017   PROT 7.0 08/16/2017   ALBUMIN 3.1 (L) 08/16/2017   AST 19 08/16/2017   ALT 15 08/16/2017   ALKPHOS 108 08/16/2017   BILITOT 0.2 08/16/2017   GFRNONAA 48 (L) 10/28/2016   GFRAA 55 (L) 10/28/2016    Lab Results  Component Value Date   WBC 8.4 09/27/2017   NEUTROABS 6.2 09/27/2017   HGB 10.3 (L) 09/27/2017   HCT 30.2 (L) 09/27/2017   MCV 92.1 09/27/2017   PLT 261 09/27/2017   Lab Results  Component Value Date   LABCA2 26.0 12/02/2015     STUDIES: No results found.  ASSESSMENT: Anemia associated with acute blood loss. Left lower lobe lung mass, history of breast cancer.  PLAN:    1. Anemia associated with acute blood loss: Patient's hemoglobin continues to slowly trend up and is now 10.3.  Her iron stores are within normal limits.  She does not require additional IV  Feraheme today.  Patient last received treatment on June 29, 2017.  Return to clinic in 3 months with repeat laboratory work and further evaluation.    2. Left lower lobe lung mass: CT results from February 15, 2017 reviewed independently with left lower lobe nodule unchanged from previous.  She was noted to have slowly progressive mediastinal and hilar adenopathy of indeterminate etiology.  Patient was scheduled to have a CT of the chest recently to assess for interval change, but had to cancel this appointment given the difficulties with her husband.  Will reschedule in the next 1 to 2 weeks.    3. History of breast cancer: Continue mammograms as ordered. CA-27-29 is within normal limits.  4. Hyperglycemia: Continue current diabetic medications.  Approximately 30 minutes was spent in discussion of which greater than 50% was consultation.   Patient expressed understanding and was in agreement with this plan. She also understands that She can call clinic at any time with any questions, concerns, or complaints.    Lloyd Huger, MD   09/27/2017 1:55 PM

## 2017-09-27 ENCOUNTER — Inpatient Hospital Stay (HOSPITAL_BASED_OUTPATIENT_CLINIC_OR_DEPARTMENT_OTHER): Payer: Medicare Other | Admitting: Oncology

## 2017-09-27 ENCOUNTER — Inpatient Hospital Stay: Payer: Medicare Other

## 2017-09-27 ENCOUNTER — Encounter: Payer: Self-pay | Admitting: Oncology

## 2017-09-27 ENCOUNTER — Other Ambulatory Visit: Payer: Self-pay

## 2017-09-27 ENCOUNTER — Inpatient Hospital Stay: Payer: Medicare Other | Attending: Oncology

## 2017-09-27 VITALS — BP 106/64 | HR 64 | Temp 96.2°F | Wt 150.4 lb

## 2017-09-27 DIAGNOSIS — D509 Iron deficiency anemia, unspecified: Secondary | ICD-10-CM

## 2017-09-27 DIAGNOSIS — R5383 Other fatigue: Secondary | ICD-10-CM

## 2017-09-27 DIAGNOSIS — R918 Other nonspecific abnormal finding of lung field: Secondary | ICD-10-CM | POA: Insufficient documentation

## 2017-09-27 DIAGNOSIS — E1165 Type 2 diabetes mellitus with hyperglycemia: Secondary | ICD-10-CM | POA: Diagnosis not present

## 2017-09-27 DIAGNOSIS — R531 Weakness: Secondary | ICD-10-CM | POA: Diagnosis not present

## 2017-09-27 DIAGNOSIS — Z853 Personal history of malignant neoplasm of breast: Secondary | ICD-10-CM

## 2017-09-27 DIAGNOSIS — D62 Acute posthemorrhagic anemia: Secondary | ICD-10-CM | POA: Diagnosis present

## 2017-09-27 LAB — CBC WITH DIFFERENTIAL/PLATELET
BASOS ABS: 0.1 10*3/uL (ref 0–0.1)
BASOS PCT: 1 %
Eosinophils Absolute: 0.4 10*3/uL (ref 0–0.7)
Eosinophils Relative: 5 %
HEMATOCRIT: 30.2 % — AB (ref 35.0–47.0)
Hemoglobin: 10.3 g/dL — ABNORMAL LOW (ref 12.0–16.0)
LYMPHS PCT: 12 %
Lymphs Abs: 1 10*3/uL (ref 1.0–3.6)
MCH: 31.5 pg (ref 26.0–34.0)
MCHC: 34.3 g/dL (ref 32.0–36.0)
MCV: 92.1 fL (ref 80.0–100.0)
MONO ABS: 0.7 10*3/uL (ref 0.2–0.9)
Monocytes Relative: 8 %
NEUTROS ABS: 6.2 10*3/uL (ref 1.4–6.5)
NEUTROS PCT: 74 %
Platelets: 261 10*3/uL (ref 150–440)
RBC: 3.27 MIL/uL — AB (ref 3.80–5.20)
RDW: 16.5 % — ABNORMAL HIGH (ref 11.5–14.5)
WBC: 8.4 10*3/uL (ref 3.6–11.0)

## 2017-09-27 LAB — IRON AND TIBC
IRON: 60 ug/dL (ref 28–170)
Saturation Ratios: 19 % (ref 10.4–31.8)
TIBC: 321 ug/dL (ref 250–450)
UIBC: 261 ug/dL

## 2017-09-27 LAB — FERRITIN: Ferritin: 42 ng/mL (ref 11–307)

## 2017-09-27 NOTE — Progress Notes (Signed)
Patient here today for follow up.   

## 2017-10-04 ENCOUNTER — Telehealth: Payer: Self-pay | Admitting: Endocrinology

## 2017-10-04 ENCOUNTER — Ambulatory Visit: Admission: RE | Admit: 2017-10-04 | Payer: Medicare Other | Source: Ambulatory Visit

## 2017-10-04 NOTE — Telephone Encounter (Signed)
Andrea Wallace is calling in regards to patients BS/ they stated that her blood sugar this morning was 33 and in the last week she has been having low blood sugar readings. They state she has had at least three really low readings. They are going to fax over her readings for the doctor to see also They would some advice on what they should do. Please advise     [phone] [name]

## 2017-10-06 ENCOUNTER — Telehealth: Payer: Self-pay | Admitting: Endocrinology

## 2017-10-06 NOTE — Telephone Encounter (Signed)
Blood sugars are being faxed for insulin adjustment.  However patient missed her last appointment and need to make clear whether patient is coming back for follow-up or not.  We cannot supervise the insulin doses unless she is seen regularly.  Also blood sugar readings need to be formatted into a table sorted by time of day and not a list

## 2017-10-06 NOTE — Telephone Encounter (Signed)
Andrea Wallace and informed staff of MD message. Staff member stated that she would have a nurse call back tomorrow to discuss follow up appointments.

## 2017-10-11 ENCOUNTER — Telehealth: Payer: Self-pay | Admitting: Endocrinology

## 2017-10-11 NOTE — Telephone Encounter (Signed)
Andrea Wallace with [facility] in [place] ph# [phone]. Notification patient's blood sugars have been very unstable-this morning at 7 am blood sugars were 33-she rcvd dextrose this morning after reading. She has had low blood sugars in the mornings even though she is eating good carbs every night. Please call  Brianna at above ph# and advise.

## 2017-10-12 ENCOUNTER — Ambulatory Visit (INDEPENDENT_AMBULATORY_CARE_PROVIDER_SITE_OTHER): Payer: Medicare Other | Admitting: Endocrinology

## 2017-10-12 ENCOUNTER — Encounter: Payer: Self-pay | Admitting: Endocrinology

## 2017-10-12 VITALS — BP 118/68 | HR 70 | Ht 63.0 in | Wt 154.0 lb

## 2017-10-12 DIAGNOSIS — E782 Mixed hyperlipidemia: Secondary | ICD-10-CM

## 2017-10-12 DIAGNOSIS — E1165 Type 2 diabetes mellitus with hyperglycemia: Secondary | ICD-10-CM | POA: Diagnosis not present

## 2017-10-12 DIAGNOSIS — Z794 Long term (current) use of insulin: Secondary | ICD-10-CM

## 2017-10-12 DIAGNOSIS — E1142 Type 2 diabetes mellitus with diabetic polyneuropathy: Secondary | ICD-10-CM | POA: Diagnosis not present

## 2017-10-12 LAB — BASIC METABOLIC PANEL
BUN: 46 mg/dL — AB (ref 6–23)
CALCIUM: 9 mg/dL (ref 8.4–10.5)
CHLORIDE: 106 meq/L (ref 96–112)
CO2: 26 mEq/L (ref 19–32)
CREATININE: 1.31 mg/dL — AB (ref 0.40–1.20)
GFR: 41.14 mL/min — ABNORMAL LOW (ref >60.00)
Glucose, Bld: 190 mg/dL — ABNORMAL HIGH (ref 70–99)
Potassium: 4.9 mEq/L (ref 3.5–5.1)
Sodium: 140 mEq/L (ref 135–145)

## 2017-10-12 LAB — GLUCOSE, POCT (MANUAL RESULT ENTRY): POC Glucose: 195 mg/dl — AB (ref 70–99)

## 2017-10-12 LAB — LIPID PANEL
CHOL/HDL RATIO: 4
Cholesterol: 166 mg/dL (ref 0–200)
HDL: 40 mg/dL (ref >39.00)
LDL CALC: 90 mg/dL (ref 0–99)
NonHDL: 126.44
TRIGLYCERIDES: 180 mg/dL — AB (ref 0.0–149.0)
VLDL: 36 mg/dL (ref 0.0–40.0)

## 2017-10-12 NOTE — Patient Instructions (Addendum)
CHANGES in insulin:  Stop the Novolin N 10 units insulin at bedtime  NOVOLIN R at suppertime to be increased to 22 units  Check blood sugars at least every other day at lunchtime  If blood sugar is over 250 before mealtimes may give extra 4 units of regular insulin  Patient has been on hydrocodone 10 mg for neuropathy pain as needed previously, please request prescription from nursing home physician

## 2017-10-12 NOTE — Progress Notes (Signed)
Patient ID: Andrea Wallace, female   DOB: October 25, 1931, 84 y.o.   MRN: [id number]   Reason for Appointment: Diabetes follow-up   History of Present Illness   Diagnosis: Type 2 DIABETES MELITUS, date of diagnosis 1999  Previous history: She has been on insulin for several years to control her diabetes and usually requires large doses Has been on basal bolus regimen for the last few years, taking Lantus twice a day.  Her blood sugars tend to fluctuate significantly but her A1c is usually at a reasonable level She does not clearly benefit from GLP-1 drugs and had been taking Byetta to help with postprandial hyperglycemia, this has been stopped  She had been tried on Victoza, Byetta and Invokana previously and these were  stopped because of unclear benefit       RECENT history:  Insulin regimen:    Novolin N 40 in a.m, 10 at bedtime REGULAR insulin 10 at breakfast, 20 BEFORE SUPPER   Oral hypoglycemic drugs: None  Her A1c is last 10.6, previously 9.7  Current management, blood sugar patterns and problems identified:  She is now in a nursing home and appears to be needing less insulin than before  Previously even with higher doses of insulin probably because of noncompliance still having much higher blood sugar readings  Although her blood sugars are reasonably good for her the blood sugars fluctuate significantly  She has had one episode of severe hypoglycemia waking up yesterday morning apparently she had fallen out of bed and had slurred speech  This is only with 10 units of NPH at bedtime  Also her blood sugars may be at times in the 80s in the morning  She says she is concerned about her weight gain which may be related to probably getting better meals at the nursing home and also much improved blood sugars  HIGHEST blood sugars overall are at about 8 PM, about 3 hours after her evening meal with only occasionally readings below 200  No hypoglycemia at suppertime  but she is not getting any insulin at lunchtime  Her blood sugar is not been checked at lunchtime at the nursing home  She said that she is mostly eating soup and salad at lunchtime  No other hypoglycemia  Blood sugars at 5 PM are quite variable and recently appear to be higher more consistently in the last 3 to 4 days only  Monitors blood glucose:  2-3 times a day on average.    Glucometer:  Unknown         Blood sugar readings by review of nursing home records:  Mean values apply above for all meters except median for One Touch  PRE-MEAL Fasting Lunch Dinner Bedtime Overall  Glucose range: 36-278  90-301 114-301   Mean/median:        POST-MEAL PC Breakfast PC Lunch PC Dinner  Glucose range:     Mean/median:      Previous readings:  PRE-MEAL Fasting Lunch  6-7 PM Bedtime Overall  Glucose range:  31-500  44, 117  163-500  450, 500   Mean/median:     269   POST-MEAL PC Breakfast PC Lunch PC Dinner  Glucose range:   244, 267   Mean/median:           Side effects from medications: None  Proper timing of medications in relation to meals: Yes.          Meals: 3 meals per day.   at breakfast she  is eating a Half bagel.  Mostly eating cheese crackers or yogurt at lunch and full meal at dinner Usually avoiding  drinks with sugar including juices          Physical activity: exercise: none, has back and leg pain            Dietician visit: Most recent: Years ago       Complications: are: Neuropathy, microalbuminuria     Wt Readings from Last 3 Encounters:  10/12/17 154 lb (69.9 kg)  09/27/17 150 lb 6 oz (68.2 kg)  08/16/17 145 lb (65.8 kg)     Lab Results  Component Value Date   HGBA1C 10.6 08/16/2017   HGBA1C 9.7 (H) 03/30/2017   HGBA1C 10.2 01/12/2017   Lab Results  Component Value Date   MICROALBUR 94.4 (H) 03/30/2017   LDLCALC 90 10/12/2017   CREATININE 1.31 (H) 10/12/2017    OTHER problems discussed today: See review of systems   Allergies as of  10/12/2017      Reactions   Ciprofloxacin Diarrhea, Swelling   Codeine Sulfate Itching      Medication List        Accurate as of 10/12/17  1:12 PM. Always use your most recent med list.          acetaminophen 500 MG tablet Commonly known as:  TYLENOL Take 500 mg by mouth daily.   aspirin 81 MG tablet Take 81 mg by mouth at bedtime.   atorvastatin 80 MG tablet Commonly known as:  LIPITOR TAKE 1 TABLET BY MOUTH ONCE DAILY   cholecalciferol 1000 units tablet Commonly known as:  VITAMIN D Take 1,000 Units by mouth daily.   DULoxetine 60 MG capsule Commonly known as:  CYMBALTA TAKE ONE CAPSULE BY MOUTH ONCE DAILY   FREESTYLE LIBRE 14 DAY SENSOR Misc 1 Units by Does not apply route every 14 (fourteen) days.   gabapentin 600 MG tablet Commonly known as:  NEURONTIN TAKE ONE TABLET BY MOUTH THREE TIMES DAILY   glucose blood test strip Commonly known as:  FREESTYLE LITE Use to test blood sugar 3 times daily dx code. E11.9   insulin NPH Human 100 UNIT/ML injection Commonly known as:  NOVOLIN N RELION Inject 40 units before breakfast and 10 units at bedtime   insulin regular 100 units/mL injection Commonly known as:  NOVOLIN R RELION Use 10 units in the morning and 20 units at supper   montelukast 10 MG tablet Commonly known as:  SINGULAIR Take 10 mg by mouth at bedtime.   propranolol 40 MG tablet Commonly known as:  INDERAL Take 40 mg by mouth at bedtime.       Allergies:  Allergies  Allergen Reactions  . Ciprofloxacin Diarrhea and Swelling  . Codeine Sulfate Itching    Past Medical History:  Diagnosis Date  . Allergy   . Anemia   . Arthritis   . Asthma    stress related per pt  . Blood transfusion   . Breast cancer [facility]) 2011   Right  . Cancer ([place])    right breast  . Complication of anesthesia    diff  waking up  . Cough   . Diabetes mellitus   . Family history of adverse reaction to anesthesia    " SISTER HAS DIFFICULTY WAKING "  .  Generalized headaches   . History of kidney stones   . Hyperlipidemia   . Lung nodules   . Nasal congestion   . Neuromuscular disorder (  [place])    neuropathy feet/legs/hands    Past Surgical History:  Procedure Laterality Date  . BACK SURGERY    . BREAST SURGERY  06/2007   right mastectomy  . BREAST SURGERY  2011   left  . FOOT SURGERY    . KIDNEY STONE SURGERY    . LITHOTRIPSY    . MASTECTOMY Right 2011   with Radiaiton  . ORIF ELBOW FRACTURE Right 10/28/2016   Procedure: OPEN REDUCTION INTERNAL FIXATION (ORIF) ELBOW/OLECRANON FRACTURE;  Surgeon: Dorna Leitz, MD;  Location: [facility];  Service: Orthopedics;  Laterality: Right;  . REDUCTION MAMMAPLASTY Left 2012  . SHOULDER ARTHROSCOPY     bilateral  . TOTAL KNEE ARTHROPLASTY    . VISCERAL ANGIOGRAPHY N/A 08/06/2016   Procedure: Visceral Angiography;  Surgeon: Algernon Huxley, MD;  Location: [place] [facility];  Service: Cardiovascular;  Laterality: N/A;  . VISCERAL ARTERY INTERVENTION N/A 08/06/2016   Procedure: Visceral Artery Intervention;  Surgeon: Algernon Huxley, MD;  Location: [place] CV [facility];  Service: Cardiovascular;  Laterality: N/A;    Family History  Problem Relation Age of Onset  . Osteoporosis Mother   . Heart disease Father   . Cancer Brother   . Breast cancer Brother   . Heart disease Sister     Social History:  reports that she quit smoking about 33 years ago. She has never used smokeless tobacco. She reports that she drinks alcohol. She reports that she does not use drugs.  Review of Systems:   Chronic kidney disease: Creatinine has been  upper normal, stable, no hypotension    Lab Results  Component Value Date   CREATININE 1.31 (H) 10/12/2017    HYPERLIPIDEMIA: The lipid abnormality consists of elevated  triglycerides, has been treated with TriCor Also followed by PCP Last triglycerides from PCP: 254     Lab Results  Component Value Date   CHOL 166 10/12/2017   HDL 40.00 10/12/2017   LDLCALC 90  10/12/2017   LDLDIRECT 116.0 03/30/2017   TRIG 180.0 (H) 10/12/2017   CHOLHDL 4 10/12/2017     She has chronic pains and paresthesia in her feet and lower legs Also has had Chronic back pain  She is taking Cymbalta  Also on gabapentin 600 mg   Refill on hydrocodone was given in 3/19, now she is in a nursing home She says that she mostly takes this at bedtime and occasionally when she is going out and being active during the day  Last diabetic foot exam was in 8/17 during some sensory loss  She has had a multinodular goiter, this has been euthyroid, Last TSH normal in 2/18 from PCP  Lab Results  Component Value Date   TSH 2.79 08/16/2017        Examination:   BP 118/68 (BP Location: Left Arm, Patient Position: Sitting, Cuff Size: Normal)   Pulse 70   Ht 5\' 3"  (1.6 m)   Wt 154 lb (69.9 kg)   SpO2 94%   BMI 27.28 kg/m   Body mass index is 27.28 kg/m.     ASSESSMENT/ PLAN:   Diabetes type 2 on insulin:  See history of present illness for detailed discussion of  current management, blood sugar patterns and problems identified  Her blood sugars are overall somewhat better with her being in a nursing home in a controlled environment She is however having fluctuating blood sugars about the same despite increasing her insulin in the morning and reducing the bedtime dose  She usually prefers to have Generic or inexpensive medications and although she may do better with U-500 insulin she probably cannot afford this  She has unusually high readings around suppertime, some of these may be right after eating also This is despite her being fairly compliant with the morning injection and increasing her NPH by 10 units on the last visit Her diet has been fairly good and she is not getting excessive carbohydrate or drinks with sugar   Recommendations:  Will need to have her take insulin doses consistently  If she is eating away from home including at the lunch meal she will need  to take regular insulin before eating consistently  Given written instructions for insulin  She will need to keep a record of her insulin doses that she is taking and bring this to her next visit  Discussed timing of checking blood sugars  She will need to reduce her bedtime dose of NPH down to 15 units since she has a tendency to overnight hypoglycemia  To check blood sugars more consistently at different times per day, up to 4 times a day  She will be given a prescription for the [facility] sensor and also discussed Medicare guidelines for use  Microalbuminuria: Will recheck today    Patient Instructions  CHANGES in insulin:  Stop the Novolin N 10 units insulin at bedtime  NOVOLIN R at suppertime to be increased to 22 units  Check blood sugars at least every other day at lunchtime  If blood sugar is over 250 before mealtimes may give extra 4 units of regular insulin  Patient has been on hydrocodone 10 mg for neuropathy pain as needed previously, please request prescription from nursing home physician        Elayne Snare 10/12/2017, 1:12 PM      Note: This office note was prepared with Dragon voice recognition system technology. Any transcriptional errors that result from this process are unintentional.             Patient ID: Andrea Wallace, female   DOB: 12-Jun-1931, 84 y.o.   MRN: [id number]   Reason for Appointment: Diabetes follow-up   History of Present Illness   Diagnosis: Type 2 DIABETES MELITUS, date of diagnosis 1999  Previous history: She has been on insulin for several years to control her diabetes and usually requires large doses Has been on basal bolus regimen for the last few years, taking Lantus twice a day.  Her blood sugars tend to fluctuate significantly but her A1c is usually at a reasonable level She does not clearly benefit from GLP-1 drugs and had been taking Byetta to help with postprandial hyperglycemia, this has been stopped  She  had been tried on Victoza, Byetta and Invokana previously and these were  stopped because of unclear benefit       RECENT history:  Insulin regimen:    Novolin N 50 in a.m, 25 at bedtime REGULAR insulin 20 at breakfast, 30  BEFORE SUPPER    Her A1c has been 10.2 recently, generally around 7-8%  Current management, blood sugar patterns and problems identified:  She appears to have the same pattern of blood sugars before despite adjusting her insulin  Difficult to know why she has fluctuation in her blood sugars especially in the mornings even though she thinks she is trying to take her insulin regularly most of the time  She was given a diary to keep her blood sugar records and her insulin doses but  not clear if she is doing this and has not brought this today  More recently her FASTING blood sugars have been closer to normal and not as consistently high; however it was 31 about 3 days ago and 321 the next day in the morning  Blood sugars are fairly consistently high in the evenings with only a couple of readings below 200  Difficult to know whether she is checking her sugar before or after eating in the evening but appears to be checking it either right before, during our soon after her meals around 6-8 PM  No readings at bedtime  HYPOGLYCEMIA has been documented only once  Again blood sugars are higher than expected at suppertime despite her not eating much at lunchtime  Her diet has been about the same as also her weight  She will has been on metformin before and appears to be not taking it now  Monitors blood glucose:  2-3 times a day on average.    Glucometer: Freestyle          Blood sugar readings by monitor download, using FreeStyle meter  Mean values apply above for all meters except median for One Touch  PRE-MEAL Fasting Lunch Dinner Bedtime Overall  Glucose range: 31-366 ?  256  129-424    Mean/median: 200   320   238    Oral hypoglycemic drugs: Metformin ER,  500 mg 2 a day       Side effects from medications: None  Proper timing of medications in relation to meals: Yes.          Meals: 3 meals per day.   at breakfast she is eating a Half bagel.  Mostly eating cheese crackers or yogurt at lunch and full meal at dinner Usually avoiding all drinks with sugar including juices          Physical activity: exercise: none, has back pain            Dietician visit: Most recent: Years ago       Complications: are: Neuropathy, microalbuminuria     Wt Readings from Last 3 Encounters:  10/12/17 154 lb (69.9 kg)  09/27/17 150 lb 6 oz (68.2 kg)  08/16/17 145 lb (65.8 kg)     Lab Results  Component Value Date   HGBA1C 10.6 08/16/2017   HGBA1C 9.7 (H) 03/30/2017   HGBA1C 10.2 01/12/2017   Lab Results  Component Value Date   MICROALBUR 94.4 (H) 03/30/2017   LDLCALC 90 10/12/2017   CREATININE 1.31 (H) 10/12/2017    OTHER problems discussed today: See review of systems   Allergies as of 10/12/2017      Reactions   Ciprofloxacin Diarrhea, Swelling   Codeine Sulfate Itching      Medication List        Accurate as of 10/12/17  1:12 PM. Always use your most recent med list.          acetaminophen 500 MG tablet Commonly known as:  TYLENOL Take 500 mg by mouth daily.   aspirin 81 MG tablet Take 81 mg by mouth at bedtime.   atorvastatin 80 MG tablet Commonly known as:  LIPITOR TAKE 1 TABLET BY MOUTH ONCE DAILY   cholecalciferol 1000 units tablet Commonly known as:  VITAMIN D Take 1,000 Units by mouth daily.   DULoxetine 60 MG capsule Commonly known as:  CYMBALTA TAKE ONE CAPSULE BY MOUTH ONCE DAILY   FREESTYLE LIBRE 14 DAY SENSOR Misc 1 Units by Does not  apply route every 14 (fourteen) days.   gabapentin 600 MG tablet Commonly known as:  NEURONTIN TAKE ONE TABLET BY MOUTH THREE TIMES DAILY   glucose blood test strip Commonly known as:  FREESTYLE LITE Use to test blood sugar 3 times daily dx code. E11.9   insulin NPH  Human 100 UNIT/ML injection Commonly known as:  NOVOLIN N RELION Inject 40 units before breakfast and 10 units at bedtime   insulin regular 100 units/mL injection Commonly known as:  NOVOLIN R RELION Use 10 units in the morning and 20 units at supper   montelukast 10 MG tablet Commonly known as:  SINGULAIR Take 10 mg by mouth at bedtime.   propranolol 40 MG tablet Commonly known as:  INDERAL Take 40 mg by mouth at bedtime.       Allergies:  Allergies  Allergen Reactions  . Ciprofloxacin Diarrhea and Swelling  . Codeine Sulfate Itching    Past Medical History:  Diagnosis Date  . Allergy   . Anemia   . Arthritis   . Asthma    stress related per pt  . Blood transfusion   . Breast cancer [facility]) 2011   Right  . Cancer ([place])    right breast  . Complication of anesthesia    diff  waking up  . Cough   . Diabetes mellitus   . Family history of adverse reaction to anesthesia    " SISTER HAS DIFFICULTY WAKING "  . Generalized headaches   . History of kidney stones   . Hyperlipidemia   . Lung nodules   . Nasal congestion   . Neuromuscular disorder ([place])    neuropathy feet/legs/hands    Past Surgical History:  Procedure Laterality Date  . BACK SURGERY    . BREAST SURGERY  06/2007   right mastectomy  . BREAST SURGERY  2011   left  . FOOT SURGERY    . KIDNEY STONE SURGERY    . LITHOTRIPSY    . MASTECTOMY Right 2011   with Radiaiton  . ORIF ELBOW FRACTURE Right 10/28/2016   Procedure: OPEN REDUCTION INTERNAL FIXATION (ORIF) ELBOW/OLECRANON FRACTURE;  Surgeon: Dorna Leitz, MD;  Location: [place];  Service: Orthopedics;  Laterality: Right;  . REDUCTION MAMMAPLASTY Left 2012  . SHOULDER ARTHROSCOPY     bilateral  . TOTAL KNEE ARTHROPLASTY    . VISCERAL ANGIOGRAPHY N/A 08/06/2016   Procedure: Visceral Angiography;  Surgeon: Algernon Huxley, MD;  Location: [place] [facility];  Service: Cardiovascular;  Laterality: N/A;  . VISCERAL ARTERY INTERVENTION N/A 08/06/2016    Procedure: Visceral Artery Intervention;  Surgeon: Algernon Huxley, MD;  Location: [place] [facility];  Service: Cardiovascular;  Laterality: N/A;    Family History  Problem Relation Age of Onset  . Osteoporosis Mother   . Heart disease Father   . Cancer Brother   . Breast cancer Brother   . Heart disease Sister     Social History:  reports that she quit smoking about 33 years ago. She has never used smokeless tobacco. She reports that she drinks alcohol. She reports that she does not use drugs.  Review of Systems:   Chronic kidney disease: Creatinine has been  upper normal Last urine microalbumin was high,     Lab Results  Component Value Date   CREATININE 1.31 (H) 10/12/2017     HYPERLIPIDEMIA: The lipid abnormality consists of elevated  triglycerides, has been treated with TriCor Also followed by PCP   Lab Results  Component Value Date   CHOL 166 10/12/2017   HDL 40.00 10/12/2017   LDLCALC 90 10/12/2017   LDLDIRECT 116.0 03/30/2017   TRIG 180.0 (H) 10/12/2017   CHOLHDL 4 10/12/2017     She has chronic pains and paresthesia in her feet and lower legs Also has had chronic significant back pain  She is taking her Cymbalta along with gabapentin 600 mg   Refill on hydrocodone for her peripheral neuropathy was given in January 19 with 100 tablets and another 100 tablets given on a written prescription today  Last diabetic foot exam was in 8/17 during some sensory loss  She has had a multinodular goiter, this has been euthyroid, Last TSH normal in 10/18 from PCP  Lab Results  Component Value Date   TSH 2.79 08/16/2017        Examination:   BP 118/68 (BP Location: Left Arm, Patient Position: Sitting, Cuff Size: Normal)   Pulse 70   Ht 5\' 3"  (1.6 m)   Wt 154 lb (69.9 kg)   SpO2 94%   BMI 27.28 kg/m   Body mass index is 27.28 kg/m.     ASSESSMENT/ PLAN:   Diabetes type 2 on insulin:  See history of present illness for detailed discussion of  current  management, blood sugar patterns and problems identified  Her A1c is 10.6 as of 3/19  She has relatively better sugars at the nursing home probably because of being in a controlled environment and more structured insulin injection compared to when she was home and noncompliant with these She is complaining about weight gain but this is likely to be from more consistent diet and insulin as well as somewhat improved blood sugar  However need to assess her level of control with the fructosamine since blood sugars are fluctuating significantly at all times  Discussed the need for more even blood sugar control with using a true basal insulin along with mealtime insulin but she is again concerned about the cost and does not want to do this now Postprandial readings are not being checked consistently but readings at night after supper are mostly high  HYPOGLYCEMIA: This appears to be rare and not clear why she had a low sugar yesterday morning with symptoms and a glucose of 36 This is likely to be from her variable action of the NPH insulin even though she is only taking 10 units now at night And has been about the same for the last 3 visits  Most likely since she has inconsistent diet her blood sugars been also fluctuate Currently she is not checking her blood sugars at lunchtime and will instructions to do so Also need to start adding extra 4 units for significantly high blood sugars over 250 at mealtimes  NEUROPATHY: Discussed that she will need to continue her medications as before but she will need to have her prescription for hydrocodone from the nursing home physician  LIPIDS: We will recheck today  Patient Instructions  CHANGES in insulin:  Stop the Novolin N 10 units insulin at bedtime  NOVOLIN R at suppertime to be increased to 22 units  Check blood sugars at least every other day at lunchtime  If blood sugar is over 250 before mealtimes may give extra 4 units of regular  insulin  Patient has been on hydrocodone 10 mg for neuropathy pain as needed previously, please request prescription from nursing home physician    Total visit time for evaluation and management of multiple problems and counseling =  25 minutes    Elayne Snare 10/12/2017, 1:12 PM      Note: This office note was prepared with Dragon voice recognition system technology. Any transcriptional errors that result from this process are unintentional.

## 2017-10-13 LAB — FRUCTOSAMINE: Fructosamine: 255 umol/L (ref 0–285)

## 2017-10-15 ENCOUNTER — Ambulatory Visit: Admission: RE | Admit: 2017-10-15 | Payer: Medicare Other | Source: Ambulatory Visit

## 2017-12-01 ENCOUNTER — Ambulatory Visit (INDEPENDENT_AMBULATORY_CARE_PROVIDER_SITE_OTHER): Payer: Medicare Other | Admitting: Endocrinology

## 2017-12-01 ENCOUNTER — Encounter: Payer: Self-pay | Admitting: Endocrinology

## 2017-12-01 VITALS — BP 124/60 | HR 84 | Ht 63.0 in | Wt 156.2 lb

## 2017-12-01 DIAGNOSIS — Z794 Long term (current) use of insulin: Secondary | ICD-10-CM | POA: Diagnosis not present

## 2017-12-01 DIAGNOSIS — E782 Mixed hyperlipidemia: Secondary | ICD-10-CM

## 2017-12-01 DIAGNOSIS — E1142 Type 2 diabetes mellitus with diabetic polyneuropathy: Secondary | ICD-10-CM | POA: Diagnosis not present

## 2017-12-01 DIAGNOSIS — E1165 Type 2 diabetes mellitus with hyperglycemia: Secondary | ICD-10-CM

## 2017-12-01 LAB — POCT GLYCOSYLATED HEMOGLOBIN (HGB A1C): HEMOGLOBIN A1C: 6.4 % — AB (ref 4.0–5.6)

## 2017-12-01 MED ORDER — HYDROCODONE-ACETAMINOPHEN 10-325 MG PO TABS: 1.0000 | ORAL_TABLET | Freq: Two times a day (BID) | ORAL | 0 refills | Status: DC | PRN

## 2017-12-01 NOTE — Progress Notes (Signed)
Patient ID: Andrea Wallace, female   DOB: 1931-07-07, 84 y.o.   MRN: [id number]   Reason for Appointment: Diabetes follow-up   History of Present Illness   Diagnosis: Type 2 DIABETES MELITUS, date of diagnosis 1999  Previous history: She has been on insulin for several years to control her diabetes and usually requires large doses Has been on basal bolus regimen for the last few years, taking Lantus twice a day.  Her blood sugars tend to fluctuate significantly but her A1c is usually at a reasonable level She does not clearly benefit from GLP-1 drugs and had been taking Byetta to help with postprandial hyperglycemia, this has been stopped  She had been tried on Victoza, Byetta and Invokana previously and these were  stopped because of unclear benefit       RECENT history:  Insulin regimen:    Novolin N 40 in a.m, 10 at bedtime; REGULAR insulin 10 at breakfast, 20 BEFORE SUPPER   Oral hypoglycemic drugs: None  Her A1c is much lower at 6.4, last 10.6, in March  Current management, blood sugar patterns and problems identified:  Her nursing home has apparently checked her blood sugar only twice a day instead of 3-4 times a day as instructed before  Also results are only available for the last 9 days on her home record  Blood sugars are relatively good fasting although has a couple of readings over 200 also  She also has blood sugars checked around 8 PM which are quite variable but only to significantly high readings on the fourth and 5 July likely to be from dietary indiscretions  Her insulin dose was not changed on her last visit since there was no consistent pattern although she was having sporadic low sugars in the mornings also  Previously was also having blood sugar checked at dinnertime  She thinks her diet is variable but she is concerned about some weight gain, has gained only 2 pounds however  No recent hypoglycemia  Monitors blood glucose:  2 times a day on  average.    Glucometer:  Unknown         Blood sugar readings by review of nursing home records:  Mean values apply above for all meters except median for One Touch  PRE-MEAL Fasting Lunch Dinner Bedtime Overall  Glucose range:  112-226      Mean/median:        POST-MEAL PC Breakfast PC Lunch PC Dinner  Glucose range:    94-400  Mean/median:           Side effects from medications: None  Proper timing of medications in relation to meals: Yes.          Meals: 3 meals per day.   at breakfast she is eating a Half bagel.  Mostly eating cheese crackers or yogurt at lunch and full meal at dinner Usually avoiding  drinks with sugar including juices          Physical activity: exercise: none, has back and leg pain            Dietician visit: Most recent: Years ago       Complications: are: Neuropathy, microalbuminuria     Wt Readings from Last 3 Encounters:  12/01/17 156 lb 3.2 oz (70.9 kg)  10/12/17 154 lb (69.9 kg)  09/27/17 150 lb 6 oz (68.2 kg)     Lab Results  Component Value Date   HGBA1C 6.4 (A) 12/01/2017   HGBA1C 10.6  08/16/2017   HGBA1C 9.7 (H) 03/30/2017   Lab Results  Component Value Date   MICROALBUR 94.4 (H) 03/30/2017   LDLCALC 90 10/12/2017   CREATININE 1.31 (H) 10/12/2017    OTHER problems discussed today: See review of systems   Allergies as of 12/01/2017      Reactions   Ciprofloxacin Diarrhea, Swelling   Codeine Sulfate Itching      Medication List        Accurate as of 12/01/17  9:07 PM. Always use your most recent med list.          acetaminophen 500 MG tablet Commonly known as:  TYLENOL Take 500 mg by mouth daily.   aspirin 81 MG tablet Take 81 mg by mouth at bedtime.   atorvastatin 80 MG tablet Commonly known as:  LIPITOR TAKE 1 TABLET BY MOUTH ONCE DAILY   cholecalciferol 1000 units tablet Commonly known as:  VITAMIN D Take 1,000 Units by mouth daily.   DULoxetine 60 MG capsule Commonly known as:  CYMBALTA TAKE ONE  CAPSULE BY MOUTH ONCE DAILY   FREESTYLE LIBRE 14 DAY SENSOR Misc 1 Units by Does not apply route every 14 (fourteen) days.   gabapentin 600 MG tablet Commonly known as:  NEURONTIN TAKE ONE TABLET BY MOUTH THREE TIMES DAILY   glucose blood test strip Commonly known as:  FREESTYLE LITE Use to test blood sugar 3 times daily dx code. E11.9   HYDROcodone-acetaminophen 10-325 MG tablet Commonly known as:  NORCO Take 1 tablet by mouth 2 (two) times daily as needed for moderate pain.   insulin NPH Human 100 UNIT/ML injection Commonly known as:  NOVOLIN N RELION Inject 40 units before breakfast and 10 units at bedtime   insulin regular 100 units/mL injection Commonly known as:  NOVOLIN R RELION Use 10 units in the morning and 20 units at supper   montelukast 10 MG tablet Commonly known as:  SINGULAIR Take 10 mg by mouth at bedtime.   propranolol 40 MG tablet Commonly known as:  INDERAL Take 20 mg by mouth 2 (two) times daily.       Allergies:  Allergies  Allergen Reactions  . Ciprofloxacin Diarrhea and Swelling  . Codeine Sulfate Itching    Past Medical History:  Diagnosis Date  . Allergy   . Anemia   . Arthritis   . Asthma    stress related per pt  . Blood transfusion   . Breast cancer [facility]) 2011   Right  . Cancer ([place])    right breast  . Complication of anesthesia    diff  waking up  . Cough   . Diabetes mellitus   . Family history of adverse reaction to anesthesia    " SISTER HAS DIFFICULTY WAKING "  . Generalized headaches   . History of kidney stones   . Hyperlipidemia   . Lung nodules   . Nasal congestion   . Neuromuscular disorder ([place])    neuropathy feet/legs/hands    Past Surgical History:  Procedure Laterality Date  . BACK SURGERY    . BREAST SURGERY  06/2007   right mastectomy  . BREAST SURGERY  2011   left  . FOOT SURGERY    . KIDNEY STONE SURGERY    . LITHOTRIPSY    . MASTECTOMY Right 2011   with Radiaiton  . ORIF ELBOW FRACTURE  Right 10/28/2016   Procedure: OPEN REDUCTION INTERNAL FIXATION (ORIF) ELBOW/OLECRANON FRACTURE;  Surgeon: Dorna Leitz, MD;  Location: [facility];  Service: Orthopedics;  Laterality: Right;  . REDUCTION MAMMAPLASTY Left 2012  . SHOULDER ARTHROSCOPY     bilateral  . TOTAL KNEE ARTHROPLASTY    . VISCERAL ANGIOGRAPHY N/A 08/06/2016   Procedure: Visceral Angiography;  Surgeon: Algernon Huxley, MD;  Location: [place] [facility];  Service: Cardiovascular;  Laterality: N/A;  . VISCERAL ARTERY INTERVENTION N/A 08/06/2016   Procedure: Visceral Artery Intervention;  Surgeon: Algernon Huxley, MD;  Location: [place] [facility];  Service: Cardiovascular;  Laterality: N/A;    Family History  Problem Relation Age of Onset  . Osteoporosis Mother   . Heart disease Father   . Cancer Brother   . Breast cancer Brother   . Heart disease Sister     Social History:  reports that she quit smoking about 33 years ago. She has never used smokeless tobacco. She reports that she drinks alcohol. She reports that she does not use drugs.  Review of Systems:   Chronic kidney disease: Creatinine has been  upper normal, stable, no hypotension    Lab Results  Component Value Date   CREATININE 1.31 (H) 10/12/2017    HYPERLIPIDEMIA: The lipid abnormality consists of elevated  triglycerides, has been treated with TriCor Also followed by PCP Last triglycerides from PCP: 254     Lab Results  Component Value Date   CHOL 166 10/12/2017   HDL 40.00 10/12/2017   LDLCALC 90 10/12/2017   LDLDIRECT 116.0 03/30/2017   TRIG 180.0 (H) 10/12/2017   CHOLHDL 4 10/12/2017     She has chronic pains and paresthesia in her feet and lower legs Also has had Chronic back pain  She is taking Cymbalta  Also on gabapentin 600 mg   Refill on hydrocodone was given in 3/19, now she is in a nursing home She says that she mostly takes this at bedtime and occasionally when she is going out and being active during the day  Last diabetic  foot exam was in 8/17 during some sensory loss  She has had a multinodular goiter, this has been euthyroid, Last TSH normal in 2/18 from PCP  Lab Results  Component Value Date   TSH 2.79 08/16/2017        Examination:   BP 124/60 (BP Location: Left Arm, Patient Position: Sitting, Cuff Size: Normal)   Pulse 84   Ht 5\' 3"  (1.6 m)   Wt 156 lb 3.2 oz (70.9 kg)   SpO2 96%   BMI 27.67 kg/m   Body mass index is 27.67 kg/m.     ASSESSMENT/ PLAN:   Diabetes type 2 on insulin:  See history of present illness for detailed discussion of  current management, blood sugar patterns and problems identified  Her blood sugars are overall somewhat better with her being in a nursing home in a controlled environment She is however having fluctuating blood sugars about the same despite increasing her insulin in the morning and reducing the bedtime dose She usually prefers to have Generic or inexpensive medications and although she may do better with U-500 insulin she probably cannot afford this  She has unusually high readings around suppertime, some of these may be right after eating also This is despite her being fairly compliant with the morning injection and increasing her NPH by 10 units on the last visit Her diet has been fairly good and she is not getting excessive carbohydrate or drinks with sugar   Recommendations:  Will need to have her take insulin doses consistently  If she is eating away from home including at the lunch meal she will need to take regular insulin before eating consistently  Given written instructions for insulin  She will need to keep a record of her insulin doses that she is taking and bring this to her next visit  Discussed timing of checking blood sugars  She will need to reduce her bedtime dose of NPH down to 15 units since she has a tendency to overnight hypoglycemia  To check blood sugars more consistently at different times per day, up to 4 times a  day  She will be given a prescription for the [facility] sensor and also discussed Medicare guidelines for use  Microalbuminuria: Will recheck today    Patient Instructions  INSTRUCTIONS for checking blood sugar:   Please check blood sugar before breakfast, before lunch, before dinner and at 8 PM DAILY  Please change the timing of the bedtime NPH to 9 PM instead of 8 PM  REGULAR insulin needs to be given 30 minutes before breakfast and supper  Patient may have hydrocodone up to twice a day as needed for moderate pain in her legs        Elayne Snare 12/01/2017, 9:07 PM      Note: This office note was prepared with Dragon voice recognition system technology. Any transcriptional errors that result from this process are unintentional.             Patient ID: Andrea Wallace, female   DOB: 1932/02/28, 84 y.o.   MRN: [id number]   Reason for Appointment: Diabetes follow-up   History of Present Illness   Diagnosis: Type 2 DIABETES MELITUS, date of diagnosis 1999  Previous history: She has been on insulin for several years to control her diabetes and usually requires large doses Has been on basal bolus regimen for the last few years, taking Lantus twice a day.  Her blood sugars tend to fluctuate significantly but her A1c is usually at a reasonable level She does not clearly benefit from GLP-1 drugs and had been taking Byetta to help with postprandial hyperglycemia, this has been stopped  She had been tried on Victoza, Byetta and Invokana previously and these were  stopped because of unclear benefit       RECENT history:  Insulin regimen:    Novolin N 50 in a.m, 25 at bedtime REGULAR insulin 20 at breakfast, 30  BEFORE SUPPER    Her A1c has been 10.2 recently, generally around 7-8%  Current management, blood sugar patterns and problems identified:  She appears to have the same pattern of blood sugars before despite adjusting her insulin  Difficult to know why she  has fluctuation in her blood sugars especially in the mornings even though she thinks she is trying to take her insulin regularly most of the time  She was given a diary to keep her blood sugar records and her insulin doses but not clear if she is doing this and has not brought this today  More recently her FASTING blood sugars have been closer to normal and not as consistently high; however it was 31 about 3 days ago and 321 the next day in the morning  Blood sugars are fairly consistently high in the evenings with only a couple of readings below 200  Difficult to know whether she is checking her sugar before or after eating in the evening but appears to be checking it either right before, during our soon after her meals around  6-8 PM  No readings at bedtime  HYPOGLYCEMIA has been documented only once  Again blood sugars are higher than expected at suppertime despite her not eating much at lunchtime  Her diet has been about the same as also her weight  She will has been on metformin before and appears to be not taking it now  Monitors blood glucose:  2-3 times a day on average.    Glucometer: Freestyle          Blood sugar readings by monitor download, using FreeStyle meter  Mean values apply above for all meters except median for One Touch  PRE-MEAL Fasting Lunch Dinner Bedtime Overall  Glucose range: 31-366 ?  256  129-424    Mean/median: 200   320   238    Oral hypoglycemic drugs: Metformin ER, 500 mg 2 a day       Side effects from medications: None  Proper timing of medications in relation to meals: Yes.          Meals: 3 meals per day.   at breakfast she is eating a Half bagel.  Mostly eating cheese crackers or yogurt at lunch and full meal at dinner Usually avoiding all drinks with sugar including juices          Physical activity: exercise: none, has back pain            Dietician visit: Most recent: Years ago       Complications: are: Neuropathy, microalbuminuria      Wt Readings from Last 3 Encounters:  12/01/17 156 lb 3.2 oz (70.9 kg)  10/12/17 154 lb (69.9 kg)  09/27/17 150 lb 6 oz (68.2 kg)     Lab Results  Component Value Date   HGBA1C 6.4 (A) 12/01/2017   HGBA1C 10.6 08/16/2017   HGBA1C 9.7 (H) 03/30/2017   Lab Results  Component Value Date   MICROALBUR 94.4 (H) 03/30/2017   LDLCALC 90 10/12/2017   CREATININE 1.31 (H) 10/12/2017    OTHER problems discussed today: See review of systems   Allergies as of 12/01/2017      Reactions   Ciprofloxacin Diarrhea, Swelling   Codeine Sulfate Itching      Medication List        Accurate as of 12/01/17  9:07 PM. Always use your most recent med list.          acetaminophen 500 MG tablet Commonly known as:  TYLENOL Take 500 mg by mouth daily.   aspirin 81 MG tablet Take 81 mg by mouth at bedtime.   atorvastatin 80 MG tablet Commonly known as:  LIPITOR TAKE 1 TABLET BY MOUTH ONCE DAILY   cholecalciferol 1000 units tablet Commonly known as:  VITAMIN D Take 1,000 Units by mouth daily.   DULoxetine 60 MG capsule Commonly known as:  CYMBALTA TAKE ONE CAPSULE BY MOUTH ONCE DAILY   FREESTYLE LIBRE 14 DAY SENSOR Misc 1 Units by Does not apply route every 14 (fourteen) days.   gabapentin 600 MG tablet Commonly known as:  NEURONTIN TAKE ONE TABLET BY MOUTH THREE TIMES DAILY   glucose blood test strip Commonly known as:  FREESTYLE LITE Use to test blood sugar 3 times daily dx code. E11.9   HYDROcodone-acetaminophen 10-325 MG tablet Commonly known as:  NORCO Take 1 tablet by mouth 2 (two) times daily as needed for moderate pain.   insulin NPH Human 100 UNIT/ML injection Commonly known as:  NOVOLIN N RELION Inject 40 units before breakfast and  10 units at bedtime   insulin regular 100 units/mL injection Commonly known as:  NOVOLIN R RELION Use 10 units in the morning and 20 units at supper   montelukast 10 MG tablet Commonly known as:  SINGULAIR Take 10 mg by mouth at  bedtime.   propranolol 40 MG tablet Commonly known as:  INDERAL Take 20 mg by mouth 2 (two) times daily.       Allergies:  Allergies  Allergen Reactions  . Ciprofloxacin Diarrhea and Swelling  . Codeine Sulfate Itching    Past Medical History:  Diagnosis Date  . Allergy   . Anemia   . Arthritis   . Asthma    stress related per pt  . Blood transfusion   . Breast cancer [facility]) 2011   Right  . Cancer ([place])    right breast  . Complication of anesthesia    diff  waking up  . Cough   . Diabetes mellitus   . Family history of adverse reaction to anesthesia    " SISTER HAS DIFFICULTY WAKING "  . Generalized headaches   . History of kidney stones   . Hyperlipidemia   . Lung nodules   . Nasal congestion   . Neuromuscular disorder ([place])    neuropathy feet/legs/hands    Past Surgical History:  Procedure Laterality Date  . BACK SURGERY    . BREAST SURGERY  06/2007   right mastectomy  . BREAST SURGERY  2011   left  . FOOT SURGERY    . KIDNEY STONE SURGERY    . LITHOTRIPSY    . MASTECTOMY Right 2011   with Radiaiton  . ORIF ELBOW FRACTURE Right 10/28/2016   Procedure: OPEN REDUCTION INTERNAL FIXATION (ORIF) ELBOW/OLECRANON FRACTURE;  Surgeon: Dorna Leitz, MD;  Location: [place];  Service: Orthopedics;  Laterality: Right;  . REDUCTION MAMMAPLASTY Left 2012  . SHOULDER ARTHROSCOPY     bilateral  . TOTAL KNEE ARTHROPLASTY    . VISCERAL ANGIOGRAPHY N/A 08/06/2016   Procedure: Visceral Angiography;  Surgeon: Algernon Huxley, MD;  Location: [place] [facility];  Service: Cardiovascular;  Laterality: N/A;  . VISCERAL ARTERY INTERVENTION N/A 08/06/2016   Procedure: Visceral Artery Intervention;  Surgeon: Algernon Huxley, MD;  Location: [place] [facility];  Service: Cardiovascular;  Laterality: N/A;    Family History  Problem Relation Age of Onset  . Osteoporosis Mother   . Heart disease Father   . Cancer Brother   . Breast cancer Brother   . Heart disease Sister     Social  History:  reports that she quit smoking about 33 years ago. She has never used smokeless tobacco. She reports that she drinks alcohol. She reports that she does not use drugs.  Review of Systems:   Chronic kidney disease: Creatinine has been  upper normal Last urine microalbumin was high     Lab Results  Component Value Date   CREATININE 1.31 (H) 10/12/2017     HYPERLIPIDEMIA: The lipid abnormality consists of elevated  triglycerides, has been treated with TriCor Also followed by PCP Last LDL below 100   Lab Results  Component Value Date   CHOL 166 10/12/2017   HDL 40.00 10/12/2017   LDLCALC 90 10/12/2017   LDLDIRECT 116.0 03/30/2017   TRIG 180.0 (H) 10/12/2017   CHOLHDL 4 10/12/2017     She has chronic pains and paresthesia in her feet and lower legs Also has had chronic back pain  She is taking her Cymbalta  along with gabapentin 600 mg 3 times daily  She was asked to request for prescription to be renewed by nursing home for hydrocodone but they have refused and only given her Tylenol She says she has significant pain in her legs at night affecting her sleep   Last diabetic foot exam was in 7/19 showing sensory loss  She has had a multinodular goiter, this has been euthyroid, Last TSH normal  Lab Results  Component Value Date   TSH 2.79 08/16/2017        Examination:   BP 124/60 (BP Location: Left Arm, Patient Position: Sitting, Cuff Size: Normal)   Pulse 84   Ht 5\' 3"  (1.6 m)   Wt 156 lb 3.2 oz (70.9 kg)   SpO2 96%   BMI 27.67 kg/m   Body mass index is 27.67 kg/m.   Diabetic Foot Exam - Simple   Simple Foot Form Diabetic Foot exam was performed with the following findings:  Yes   Visual Inspection No deformities, no ulcerations, no other skin breakdown bilaterally:  Yes Sensation Testing See comments:  Yes Pulse Check See comments:  Yes Comments Absent distal monofilament sensation Bilaterally 1-2+ pedal pulses only       ASSESSMENT/  PLAN:   Diabetes type 2 on insulin:  See history of present illness for detailed discussion of  current management, blood sugar patterns and problems identified  Her A1c is 6.4 which is markedly improved from a previous level of 10.6  She has significant improvement in her blood sugar control with being in a controlled environment at the nursing home and previously likely was not taking her insulin doses on time and eating inconsistently also Her only high blood sugars were on July 4 and 5 likely to be from poor diet on those days  However nursing home does not appear to be following instructions for checking blood sugars and taking extra insulin for high readings given previously Sugars are being checked only twice a day at breakfast and bedtime  No change in insulin to be done as yet Written instructions given for specific times to check her blood sugar Her bedtime NPH will need to be moved to at least 9 PM instead of 8 PM She also needs to take her regular insulin 30 minutes before breakfast and supper instead of fixed times of 8 AM and 6 PM  NEUROPATHY: She has significant nocturnal pain and also along with her back pain and radiculopathy she needs hydrocodone for effective pain relief especially at night Given written prescription for hydrocodone to be filled by the nursing home, she can take this up to twice a day  Patient Instructions  INSTRUCTIONS for checking blood sugar:   Please check blood sugar before breakfast, before lunch, before dinner and at 8 PM DAILY  Please change the timing of the bedtime NPH to 9 PM instead of 8 PM  REGULAR insulin needs to be given 30 minutes before breakfast and supper  Patient may have hydrocodone up to twice a day as needed for moderate pain in her legs    Total visit time for evaluation and management of multiple problems and counseling =25 minutes    Elayne Snare 12/01/2017, 9:07 PM      Note: This office note was prepared with  Dragon voice recognition system technology. Any transcriptional errors that result from this process are unintentional.

## 2017-12-01 NOTE — Patient Instructions (Signed)
INSTRUCTIONS for checking blood sugar:   Please check blood sugar before breakfast, before lunch, before dinner and at 8 PM DAILY  Please change the timing of the bedtime NPH to 9 PM instead of 8 PM  REGULAR insulin needs to be given 30 minutes before breakfast and supper  Patient may have hydrocodone up to twice a day as needed for moderate pain in her legs

## 2017-12-27 NOTE — Progress Notes (Deleted)
[place]  [phone] [phone]  ID: Andrea Wallace OB: [birth year]  MR#: [id number]  OIN#:[id number]  Patient Care Team: Kirk Ruths, MD as PCP - General (Unknown Physician Specialty)  CHIEF COMPLAINT: Anemia associated with acute blood loss. Left lower lobe lung mass, history of breast cancer.  INTERVAL HISTORY: Patient returns to clinic today for repeat laboratory work and further evaluation.  She continues to have chronic weakness and fatigue, but attributes this to her husband who is still having difficulty time recovering from a broken hip.  He also has a declining performance status as well as worsening memory.  She otherwise feels well.  She has no neurologic complaints. She denies any chest pain, cough, hemoptysis, or shortness of breath. She has no neurologic complaints. She denies any recent fevers or illnesses. She has a good appetite and denies any weight loss. She has no nausea, vomiting, constipation, or diarrhea.  She denies any melena or hematochezia.  She has no urinary complaints.  Patient offers no further specific complaints today.   REVIEW OF SYSTEMS:   Review of Systems  Constitutional: Positive for malaise/fatigue. Negative for fever and weight loss.  Respiratory: Negative.  Negative for cough, hemoptysis, sputum production and shortness of breath.   Cardiovascular: Negative.  Negative for chest pain and leg swelling.  Gastrointestinal: Negative.  Negative for abdominal pain, blood in stool and melena.  Genitourinary: Negative.   Musculoskeletal: Negative.   Skin: Negative.  Negative for rash.  Neurological: Positive for weakness. Negative for sensory change and focal weakness.  Psychiatric/Behavioral: The patient has insomnia. The patient is not nervous/anxious.     As per HPI. Otherwise, a complete review of systems is negative.  PAST MEDICAL HISTORY: Past Medical History:  Diagnosis Date  . Allergy   .  Anemia   . Arthritis   . Asthma    stress related per pt  . Blood transfusion   . Breast cancer [facility]) 2011   Right  . Cancer ([place])    right breast  . Complication of anesthesia    diff  waking up  . Cough   . Diabetes mellitus   . Family history of adverse reaction to anesthesia    " SISTER HAS DIFFICULTY WAKING "  . Generalized headaches   . History of kidney stones   . Hyperlipidemia   . Lung nodules   . Nasal congestion   . Neuromuscular disorder ([place])    neuropathy feet/legs/hands    PAST SURGICAL HISTORY: Past Surgical History:  Procedure Laterality Date  . BACK SURGERY    . BREAST SURGERY  06/2007   right mastectomy  . BREAST SURGERY  2011   left  . FOOT SURGERY    . KIDNEY STONE SURGERY    . LITHOTRIPSY    . MASTECTOMY Right 2011   with Radiaiton  . ORIF ELBOW FRACTURE Right 10/28/2016   Procedure: OPEN REDUCTION INTERNAL FIXATION (ORIF) ELBOW/OLECRANON FRACTURE;  Surgeon: Dorna Leitz, MD;  Location: [place];  Service: Orthopedics;  Laterality: Right;  . REDUCTION MAMMAPLASTY Left 2012  . SHOULDER ARTHROSCOPY     bilateral  . TOTAL KNEE ARTHROPLASTY    . VISCERAL ANGIOGRAPHY N/A 08/06/2016   Procedure: Visceral Angiography;  Surgeon: Algernon Huxley, MD;  Location: [place] [facility];  Service: Cardiovascular;  Laterality: N/A;  . VISCERAL ARTERY INTERVENTION N/A 08/06/2016   Procedure: Visceral Artery Intervention;  Surgeon: Algernon Huxley, MD;  Location: [place] [facility];  Service:  Cardiovascular;  Laterality: N/A;    FAMILY HISTORY Family History  Problem Relation Age of Onset  . Osteoporosis Mother   . Heart disease Father   . Cancer Brother   . Breast cancer Brother   . Heart disease Sister        ADVANCED DIRECTIVES:    HEALTH MAINTENANCE: Social History   Tobacco Use  . Smoking status: Former Smoker    Last attempt to quit: 03/15/1984    Years since quitting: 33.8  . Smokeless tobacco: Never Used  Substance Use Topics  . Alcohol use: Yes      Comment: occ  . Drug use: No     Colonoscopy:  PAP:  Bone density:  Lipid panel:  Allergies  Allergen Reactions  . Ciprofloxacin Diarrhea and Swelling  . Codeine Sulfate Itching    Current Outpatient Medications  Medication Sig Dispense Refill  . acetaminophen (TYLENOL) 500 MG tablet Take 500 mg by mouth daily.     Marland Kitchen aspirin 81 MG tablet Take 81 mg by mouth at bedtime.     Marland Kitchen atorvastatin (LIPITOR) 80 MG tablet TAKE 1 TABLET BY MOUTH ONCE DAILY    . cholecalciferol (VITAMIN D) 1000 units tablet Take 1,000 Units by mouth daily.    . Continuous Blood Gluc Sensor (FREESTYLE LIBRE 14 DAY SENSOR) MISC 1 Units by Does not apply route every 14 (fourteen) days. 2 each 4  . DULoxetine (CYMBALTA) 60 MG capsule TAKE ONE CAPSULE BY MOUTH ONCE DAILY 30 capsule 2  . gabapentin (NEURONTIN) 600 MG tablet TAKE ONE TABLET BY MOUTH THREE TIMES DAILY 90 tablet 5  . glucose blood (FREESTYLE LITE) test strip Use to test blood sugar 3 times daily dx code. E11.9 100 each 5  . HYDROcodone-acetaminophen (NORCO) 10-325 MG tablet Take 1 tablet by mouth 2 (two) times daily as needed for moderate pain. 100 tablet 0  . insulin NPH Human (NOVOLIN N RELION) 100 UNIT/ML injection Inject 40 units before breakfast and 10 units at bedtime 20 mL 3  . insulin regular (NOVOLIN R RELION) 100 units/mL injection Use 10 units in the morning and 20 units at supper 20 mL 3  . montelukast (SINGULAIR) 10 MG tablet Take 10 mg by mouth at bedtime.     . propranolol (INDERAL) 40 MG tablet Take 20 mg by mouth 2 (two) times daily.      No current facility-administered medications for this visit.     OBJECTIVE: There were no vitals filed for this visit.   There is no height or weight on file to calculate BMI.    ECOG FS:0 - Asymptomatic  General: Well-developed, well-nourished, no acute distress. Eyes: Pink conjunctiva, anicteric sclera. Lungs: Clear to auscultation bilaterally. Heart: Regular rate and rhythm. No rubs,  murmurs, or gallops. Abdomen: Soft, nontender, nondistended. No organomegaly noted, normoactive bowel sounds. Musculoskeletal: No edema, cyanosis, or clubbing. Neuro: Alert, answering all questions appropriately. Cranial nerves grossly intact. Skin: No rashes or petechiae noted. Psych: Normal affect.   LAB RESULTS:  Lab Results  Component Value Date   NA 140 10/12/2017   K 4.9 10/12/2017   CL 106 10/12/2017   CO2 26 10/12/2017   GLUCOSE 190 (H) 10/12/2017   BUN 46 (H) 10/12/2017   CREATININE 1.31 (H) 10/12/2017   CALCIUM 9.0 10/12/2017   PROT 7.0 08/16/2017   ALBUMIN 3.1 (L) 08/16/2017   AST 19 08/16/2017   ALT 15 08/16/2017   ALKPHOS 108 08/16/2017   BILITOT 0.2 08/16/2017   GFRNONAA  48 (L) 10/28/2016   GFRAA 55 (L) 10/28/2016    Lab Results  Component Value Date   WBC 8.4 09/27/2017   NEUTROABS 6.2 09/27/2017   HGB 10.3 (L) 09/27/2017   HCT 30.2 (L) 09/27/2017   MCV 92.1 09/27/2017   PLT 261 09/27/2017   Lab Results  Component Value Date   LABCA2 26.0 12/02/2015     STUDIES: No results found.  ASSESSMENT: Anemia associated with acute blood loss. Left lower lobe lung mass, history of breast cancer.  PLAN:    1. Anemia associated with acute blood loss: Patient's hemoglobin continues to slowly trend up and is now 10.3.  Her iron stores are within normal limits.  She does not require additional IV Feraheme today.  Patient last received treatment on June 29, 2017.  Return to clinic in 3 months with repeat laboratory work and further evaluation.    2. Left lower lobe lung mass: CT results from February 15, 2017 reviewed independently with left lower lobe nodule unchanged from previous.  She was noted to have slowly progressive mediastinal and hilar adenopathy of indeterminate etiology.  Patient was scheduled to have a CT of the chest recently to assess for interval change, but had to cancel this appointment given the difficulties with her husband.  Will reschedule  in the next 1 to 2 weeks.    3. History of breast cancer: Continue mammograms as ordered. CA-27-29 is within normal limits.  4. Hyperglycemia: Continue current diabetic medications.  Approximately 30 minutes was spent in discussion of which greater than 50% was consultation.   Patient expressed understanding and was in agreement with this plan. She also understands that She can call clinic at any time with any questions, concerns, or complaints.    Lloyd Huger, MD   12/27/2017 9:28 AM

## 2017-12-28 ENCOUNTER — Inpatient Hospital Stay: Payer: Medicare Other | Admitting: Oncology

## 2017-12-28 ENCOUNTER — Inpatient Hospital Stay: Payer: Medicare Other

## 2018-02-03 ENCOUNTER — Encounter: Payer: Self-pay | Admitting: Emergency Medicine

## 2018-02-03 ENCOUNTER — Other Ambulatory Visit: Payer: Self-pay

## 2018-02-03 ENCOUNTER — Observation Stay
Admission: EM | Admit: 2018-02-03 | Discharge: 2018-02-04 | Disposition: A | Discharge status: Nursing Home (Includes ICF) | Payer: Medicare Other | Attending: Specialist | Admitting: Specialist

## 2018-02-03 DIAGNOSIS — D649 Anemia, unspecified: Secondary | ICD-10-CM

## 2018-02-03 DIAGNOSIS — N183 Chronic kidney disease, stage 3 (moderate): Secondary | ICD-10-CM | POA: Diagnosis not present

## 2018-02-03 DIAGNOSIS — Z87891 Personal history of nicotine dependence: Secondary | ICD-10-CM | POA: Diagnosis not present

## 2018-02-03 DIAGNOSIS — Z9011 Acquired absence of right breast and nipple: Secondary | ICD-10-CM | POA: Diagnosis not present

## 2018-02-03 DIAGNOSIS — R9431 Abnormal electrocardiogram [ECG] [EKG]: Secondary | ICD-10-CM | POA: Insufficient documentation

## 2018-02-03 DIAGNOSIS — Z853 Personal history of malignant neoplasm of breast: Secondary | ICD-10-CM | POA: Insufficient documentation

## 2018-02-03 DIAGNOSIS — I1310 Hypertensive heart and chronic kidney disease without heart failure, with stage 1 through stage 4 chronic kidney disease, or unspecified chronic kidney disease: Secondary | ICD-10-CM | POA: Diagnosis not present

## 2018-02-03 DIAGNOSIS — Z794 Long term (current) use of insulin: Secondary | ICD-10-CM | POA: Diagnosis not present

## 2018-02-03 DIAGNOSIS — Z79899 Other long term (current) drug therapy: Secondary | ICD-10-CM | POA: Diagnosis not present

## 2018-02-03 DIAGNOSIS — R079 Chest pain, unspecified: Secondary | ICD-10-CM | POA: Diagnosis present

## 2018-02-03 DIAGNOSIS — E1122 Type 2 diabetes mellitus with diabetic chronic kidney disease: Principal | ICD-10-CM | POA: Insufficient documentation

## 2018-02-03 DIAGNOSIS — M1990 Unspecified osteoarthritis, unspecified site: Secondary | ICD-10-CM | POA: Diagnosis not present

## 2018-02-03 DIAGNOSIS — Z8249 Family history of ischemic heart disease and other diseases of the circulatory system: Secondary | ICD-10-CM | POA: Insufficient documentation

## 2018-02-03 DIAGNOSIS — J45909 Unspecified asthma, uncomplicated: Secondary | ICD-10-CM | POA: Diagnosis not present

## 2018-02-03 DIAGNOSIS — E785 Hyperlipidemia, unspecified: Secondary | ICD-10-CM | POA: Insufficient documentation

## 2018-02-03 DIAGNOSIS — D631 Anemia in chronic kidney disease: Secondary | ICD-10-CM | POA: Diagnosis not present

## 2018-02-03 DIAGNOSIS — R05 Cough: Secondary | ICD-10-CM | POA: Diagnosis not present

## 2018-02-03 DIAGNOSIS — Z7982 Long term (current) use of aspirin: Secondary | ICD-10-CM | POA: Insufficient documentation

## 2018-02-03 DIAGNOSIS — E1140 Type 2 diabetes mellitus with diabetic neuropathy, unspecified: Secondary | ICD-10-CM | POA: Insufficient documentation

## 2018-02-03 LAB — CBC WITH DIFFERENTIAL/PLATELET
BASOS PCT: 2 %
Basophils Absolute: 0.2 10*3/uL — ABNORMAL HIGH (ref 0–0.1)
Eosinophils Absolute: 0.4 10*3/uL (ref 0–0.7)
Eosinophils Relative: 4 %
HCT: 22.5 % — ABNORMAL LOW (ref 35.0–47.0)
Hemoglobin: 6.7 g/dL — ABNORMAL LOW (ref 12.0–16.0)
LYMPHS ABS: 1.6 10*3/uL (ref 1.0–3.6)
Lymphocytes Relative: 16 %
MCH: 22.9 pg — ABNORMAL LOW (ref 26.0–34.0)
MCHC: 29.9 g/dL — AB (ref 32.0–36.0)
MCV: 76.6 fL — ABNORMAL LOW (ref 80.0–100.0)
MONOS PCT: 9 %
Monocytes Absolute: 0.9 10*3/uL (ref 0.2–0.9)
Neutro Abs: 6.9 10*3/uL — ABNORMAL HIGH (ref 1.4–6.5)
Neutrophils Relative %: 69 %
Platelets: 357 10*3/uL (ref 150–440)
RBC: 2.93 MIL/uL — ABNORMAL LOW (ref 3.80–5.20)
RDW: 20.3 % — ABNORMAL HIGH (ref 11.5–14.5)
WBC: 10 10*3/uL (ref 3.6–11.0)

## 2018-02-03 LAB — BASIC METABOLIC PANEL
Anion gap: 7 (ref 5–15)
BUN: 35 mg/dL — ABNORMAL HIGH (ref 8–23)
CHLORIDE: 108 mmol/L (ref 98–111)
CO2: 25 mmol/L (ref 22–32)
CREATININE: 1.58 mg/dL — AB (ref 0.44–1.00)
Calcium: 8.3 mg/dL — ABNORMAL LOW (ref 8.9–10.3)
GFR calc non Af Amer: 29 mL/min — ABNORMAL LOW (ref >60)
GFR, EST AFRICAN AMERICAN: 34 mL/min — AB (ref >60)
Glucose, Bld: 127 mg/dL — ABNORMAL HIGH (ref 70–99)
Potassium: 4.3 mmol/L (ref 3.5–5.1)
Sodium: 140 mmol/L (ref 135–145)

## 2018-02-03 LAB — CBC
HCT: 29.5 % — ABNORMAL LOW (ref 35.0–47.0)
Hemoglobin: 9.1 g/dL — ABNORMAL LOW (ref 12.0–16.0)
MCH: 24.3 pg — AB (ref 26.0–34.0)
MCHC: 31 g/dL — ABNORMAL LOW (ref 32.0–36.0)
MCV: 78.4 fL — AB (ref 80.0–100.0)
Platelets: 330 10*3/uL (ref 150–440)
RBC: 3.76 MIL/uL — AB (ref 3.80–5.20)
RDW: 18.9 % — ABNORMAL HIGH (ref 11.5–14.5)
WBC: 10.4 10*3/uL (ref 3.6–11.0)

## 2018-02-03 LAB — TROPONIN I: Troponin I: <0.03 ng/mL (ref <0.03)

## 2018-02-03 LAB — PREPARE RBC (CROSSMATCH)

## 2018-02-03 MED ORDER — SODIUM CHLORIDE 0.9 % IV SOLN: 10.0000 mL/h | Freq: Once | INTRAVENOUS | Status: DC

## 2018-02-03 MED ORDER — IRON 325 (65 FE) MG PO TABS: 1.0000 | ORAL_TABLET | ORAL | 1 refills | Status: AC

## 2018-02-03 NOTE — ED Provider Notes (Addendum)
[facility] Emergency Department Provider Note    First MD Initiated Contact with Patient 02/03/18 1734     (approximate)  I have reviewed the triage vital signs and the nursing notes.   HISTORY  Chief Complaint Anemia    HPI Andrea Wallace is a 84 y.o. female presents the ER with chief complaint of over 1 week of lightheadedness dizziness and shortness of breath.  States she is felt similar when her hemoglobin levels were low.  Has had to have blood transfusions in the past.  Is supposed to be on supplemental iron but has not been taking it.  Denies any chest pain.  No nausea or vomiting.  No hematochezia or melena.    Past Medical History:  Diagnosis Date  . Allergy   . Anemia   . Arthritis   . Asthma    stress related per pt  . Blood transfusion   . Breast cancer [facility]) 2011   Right  . Cancer ([place])    right breast  . Complication of anesthesia    diff  waking up  . Cough   . Diabetes mellitus   . Family history of adverse reaction to anesthesia    " SISTER HAS DIFFICULTY WAKING "  . Generalized headaches   . History of kidney stones   . Hyperlipidemia   . Lung nodules   . Nasal congestion   . Neuromuscular disorder ([facility])    neuropathy feet/legs/hands   Family History  Problem Relation Age of Onset  . Osteoporosis Mother   . Heart disease Father   . Cancer Brother   . Breast cancer Brother   . Heart disease Sister    Past Surgical History:  Procedure Laterality Date  . BACK SURGERY    . BREAST SURGERY  06/2007   right mastectomy  . BREAST SURGERY  2011   left  . FOOT SURGERY    . KIDNEY STONE SURGERY    . LITHOTRIPSY    . MASTECTOMY Right 2011   with Radiaiton  . ORIF ELBOW FRACTURE Right 10/28/2016   Procedure: OPEN REDUCTION INTERNAL FIXATION (ORIF) ELBOW/OLECRANON FRACTURE;  Surgeon: Dorna Leitz, MD;  Location: [place];  Service: Orthopedics;  Laterality: Right;  . REDUCTION MAMMAPLASTY Left 2012  . SHOULDER ARTHROSCOPY       bilateral  . TOTAL KNEE ARTHROPLASTY    . VISCERAL ANGIOGRAPHY N/A 08/06/2016   Procedure: Visceral Angiography;  Surgeon: Algernon Huxley, MD;  Location: [place] [facility];  Service: Cardiovascular;  Laterality: N/A;  . VISCERAL ARTERY INTERVENTION N/A 08/06/2016   Procedure: Visceral Artery Intervention;  Surgeon: Algernon Huxley, MD;  Location: [place] [facility];  Service: Cardiovascular;  Laterality: N/A;   Patient Active Problem List   Diagnosis Date Noted  . PAD (peripheral artery disease) ([place]) 03/23/2017  . SMA stenosis ([place]) 03/23/2017  . Displaced fracture of olecranon process of right ulna with intra-articular extension 10/28/2016  . Displaced fracture of olecranon process with intraarticular extension of right ulna, initial encounter for closed fracture 10/28/2016  . Essential hypertension, benign 07/28/2016  . Hyperlipidemia 07/28/2016  . Abdominal pain 07/28/2016  . Chronic mesenteric ischemia ([place]) 07/28/2016  . Diabetic polyneuropathy associated with type 2 diabetes mellitus ([place]) 02/25/2016  . Mass of lower lobe of left lung 02/16/2016  . Calculus of kidney 08/14/2015  . Abnormal presence of protein in urine 07/29/2015  . Osteopenia 12/18/2014  . Atherosclerosis of abdominal aorta ([place]) 03/24/2014  . Carotid  arterial disease ([place]) 01/06/2014  . Headache, migraine 01/06/2014  . Lumbar canal stenosis 01/06/2014  . Carotid artery disease ([place]) 01/06/2014  . Asthma in adult without complication 12/45/8099  . CAD (coronary artery disease) 01/06/2014  . OA (osteoarthritis) 01/06/2014  . Thyroid goiter 01/06/2014  . Back pain, chronic 10/30/2013  . Chronic kidney disease, stage III (moderate) ([place]) 05/03/2013  . Type II diabetes mellitus, uncontrolled ([place]) 01/11/2013  . Other and unspecified hyperlipidemia 12/28/2012  . Multinodular goiter 11/01/2012  . Diabetes ([place]) 06/29/2012  . Lumbar stenosis with neurogenic claudication 06/29/2012  . Anemia associated with acute  blood loss 06/29/2012  . Breast cancer ([place]) 03/16/2011      Prior to Admission medications   Medication Sig Start Date End Date Taking? Authorizing Provider  acetaminophen (TYLENOL) 500 MG tablet Take 500 mg by mouth daily.     [provider]  aspirin 81 MG tablet Take 81 mg by mouth at bedtime.     [provider]  atorvastatin (LIPITOR) 80 MG tablet TAKE 1 TABLET BY MOUTH ONCE DAILY 12/21/16   [provider]  cholecalciferol (VITAMIN D) 1000 units tablet Take 1,000 Units by mouth daily.    [provider]  Continuous Blood Gluc Sensor (FREESTYLE LIBRE 14 DAY SENSOR) MISC 1 Units by Does not apply route every 14 (fourteen) days. 08/16/17   Elayne Snare, MD  DULoxetine (CYMBALTA) 60 MG capsule TAKE ONE CAPSULE BY MOUTH ONCE DAILY 02/03/17   Elayne Snare, MD  Ferrous Sulfate (IRON) 325 (65 Fe) MG TABS Take 1 tablet (325 mg total) by mouth every other day. 02/03/18   Merlyn Lot, MD  gabapentin (NEURONTIN) 600 MG tablet TAKE ONE TABLET BY MOUTH THREE TIMES DAILY 12/07/16   Elayne Snare, MD  glucose blood (FREESTYLE LITE) test strip Use to test blood sugar 3 times daily dx code. E11.9 01/28/17   Elayne Snare, MD  HYDROcodone-acetaminophen (NORCO) 10-325 MG tablet Take 1 tablet by mouth 2 (two) times daily as needed for moderate pain. 12/01/17   Elayne Snare, MD  insulin NPH Human (NOVOLIN N RELION) 100 UNIT/ML injection Inject 40 units before breakfast and 10 units at bedtime 09/02/17   Renato Shin, MD  insulin regular (NOVOLIN R RELION) 100 units/mL injection Use 10 units in the morning and 20 units at supper 09/02/17   Renato Shin, MD  montelukast (SINGULAIR) 10 MG tablet Take 10 mg by mouth at bedtime.  03/02/11   [provider]  propranolol (INDERAL) 40 MG tablet Take 20 mg by mouth 2 (two) times daily.     [provider]    Allergies Ciprofloxacin and Codeine sulfate    Social History Social History   Tobacco Use  . Smoking  status: Former Smoker    Last attempt to quit: 03/15/1984    Years since quitting: 33.9  . Smokeless tobacco: Never Used  Substance Use Topics  . Alcohol use: Yes    Comment: occ  . Drug use: No    Review of Systems Patient denies headaches, rhinorrhea, blurry vision, numbness, shortness of breath, chest pain, edema, cough, abdominal pain, nausea, vomiting, diarrhea, dysuria, fevers, rashes or hallucinations unless otherwise stated above in HPI. ____________________________________________   PHYSICAL EXAM:  VITAL SIGNS: Vitals:   02/03/18 2315 02/03/18 2322  BP: 132/74 125/67  Pulse: 92 91  Resp:  18  Temp:  98.6 F (37 C)  SpO2: 93% 94%    Constitutional: Alert and oriented.  Eyes: Conjunctivae are normal.  Head:  Atraumatic. Nose: No congestion/rhinnorhea. Mouth/Throat: Mucous membranes are moist.   Neck: No stridor. Painless ROM.  Cardiovascular: Normal rate, regular rhythm. Grossly normal heart sounds.  Good peripheral circulation. Respiratory: Normal respiratory effort.  No retractions. Lungs CTAB. Gastrointestinal: Soft and nontender. No distention. No abdominal bruits. No CVA tenderness. Genitourinary:  Musculoskeletal: No lower extremity tenderness nor edema.  No joint effusions. Neurologic:  Normal speech and language. No gross focal neurologic deficits are appreciated. No facial droop Skin:  Skin is warm, dry and intact. No rash noted. Psychiatric: Mood and affect are normal. Speech and behavior are normal.  ____________________________________________   LABS (all labs ordered are listed, but only abnormal results are displayed)  Results for orders placed or performed during the hospital encounter of 02/03/18 (from the past 24 hour(s))  CBC with Differential/Platelet     Status: Abnormal   Collection Time: 02/03/18  5:38 PM  Result Value Ref Range   WBC 10.0 3.6 - 11.0 K/uL   RBC 2.93 (L) 3.80 - 5.20 MIL/uL   Hemoglobin 6.7 (L) 12.0 - 16.0 g/dL   HCT  22.5 (L) 35.0 - 47.0 %   MCV 76.6 (L) 80.0 - 100.0 fL   MCH 22.9 (L) 26.0 - 34.0 pg   MCHC 29.9 (L) 32.0 - 36.0 g/dL   RDW 20.3 (H) 11.5 - 14.5 %   Platelets 357 150 - 440 K/uL   Neutrophils Relative % 69 %   Neutro Abs 6.9 (H) 1.4 - 6.5 K/uL   Lymphocytes Relative 16 %   Lymphs Abs 1.6 1.0 - 3.6 K/uL   Monocytes Relative 9 %   Monocytes Absolute 0.9 0.2 - 0.9 K/uL   Eosinophils Relative 4 %   Eosinophils Absolute 0.4 0 - 0.7 K/uL   Basophils Relative 2 %   Basophils Absolute 0.2 (H) 0 - 0.1 K/uL  Type and screen [place]     Status: None (Preliminary result)   Collection Time: 02/03/18  5:38 PM  Result Value Ref Range   ABO/RH(D) A POS    Antibody Screen NEG    Sample Expiration 02/06/2018    Unit Number [id number]    Blood Component Type RED CELLS,LR    Unit division 00    Status of Unit ISSUED    Transfusion Status OK TO TRANSFUSE    Crossmatch Result Compatible    Unit Number [id number]    Blood Component Type RED CELLS,LR    Unit division 00    Status of Unit ISSUED    Transfusion Status OK TO TRANSFUSE    Crossmatch Result      Compatible Performed at [facility], [address] [place], [place] [address]   Basic metabolic panel     Status: Abnormal   Collection Time: 02/03/18  5:38 PM  Result Value Ref Range   Sodium 140 135 - 145 mmol/L   Potassium 4.3 3.5 - 5.1 mmol/L   Chloride 108 98 - 111 mmol/L   CO2 25 22 - 32 mmol/L   Glucose, Bld 127 (H) 70 - 99 mg/dL   BUN 35 (H) 8 - 23 mg/dL   Creatinine, Ser 1.58 (H) 0.44 - 1.00 mg/dL   Calcium 8.3 (L) 8.9 - 10.3 mg/dL   GFR calc non Af Amer 29 (L) >60 mL/min   GFR calc Af Amer 34 (L) >60 mL/min   Anion gap 7 5 - 15  Prepare RBC     Status: None   Collection Time: 02/03/18  6:05 PM  Result Value Ref Range   Order Confirmation      ORDER PROCESSED BY BLOOD BANK Performed at [facility], [address] [place], [place] [address]   CBC     Status: Abnormal     Collection Time: 02/03/18  8:21 PM  Result Value Ref Range   WBC 10.4 3.6 - 11.0 K/uL   RBC 3.76 (L) 3.80 - 5.20 MIL/uL   Hemoglobin 9.1 (L) 12.0 - 16.0 g/dL   HCT 29.5 (L) 35.0 - 47.0 %   MCV 78.4 (L) 80.0 - 100.0 fL   MCH 24.3 (L) 26.0 - 34.0 pg   MCHC 31.0 (L) 32.0 - 36.0 g/dL   RDW 18.9 (H) 11.5 - 14.5 %   Platelets 330 150 - 440 K/uL   ____________________________________________  EKG My review and personal interpretation at Time: 18:17   Indication: sob  Rate: 85  Rhythm: sinus Axis: normal Other:  Normal intervals, non specific st abn, no significant change as compared to previous ____________________________________________  RADIOLOGY I personally reviewed all radiographic images ordered to evaluate for the above acute complaints and reviewed radiology reports and findings.  These findings were personally discussed with the patient.  Please see medical record for radiology report.  ____________________________________________   PROCEDURES  Procedure(s) performed:  Procedures    Critical Care performed: yes ____________________________________________   INITIAL IMPRESSION / ASSESSMENT AND PLAN / ED COURSE  Pertinent labs & imaging results that were available during my care of the patient were reviewed by me and considered in my medical decision making (see chart for details).   DDX: Symptomatic anemia, iron deficiency anemia, malnutrition, malignancy  Andrea Wallace is a 84 y.o. who presents to the ED with evidence of symptomatic anemia.  Has had recurrent episodes of this in the past.  No signs or symptoms of acute GI bleeding.  Possible chronic losses.  Her symptomatology with worsening shortness of breath and exertional dyspnea will initiate transfusion after the patient has been consented.  We will continue to monitor patient.  She does have some nonspecific EKG changes .  I do suspect secondary to anemia.  No signs or symptoms of ACS.  Clinical Course as of  Feb 04 2351  Thu Feb 03, 2018  2318 Patient reassessed after transfusion.  She is feeling much improved.  Blood pressure also improved.  Denies any chest pain.  EKG is unchanged.  At this point do believe she stable and appropriate for outpatient follow-up.   [PR]  2351 Patient reassessed and states that she is still having some weakness and shortness of breath.  Given her EKG changes and significant anemia requiring 2 units of blood and age will discuss with hospitalist for admission for observation serial enzymes and troponins.   [PR]    Clinical Course User Index [PR] Merlyn Lot, MD     As part of my medical decision making, I reviewed the following data within the [place] notes reviewed and incorporated, Labs reviewed, notes from prior ED visits and [place] Controlled Substance Database   ____________________________________________   FINAL CLINICAL IMPRESSION(S) / ED DIAGNOSES  Final diagnoses:  Symptomatic anemia  Abnormal electrocardiogram (ECG) (EKG)      NEW MEDICATIONS STARTED DURING THIS VISIT:  New Prescriptions   FERROUS SULFATE (IRON) 325 (65 FE) MG TABS    Take 1 tablet (325 mg total) by mouth every other day.     Note:  This document was prepared using  [employer] forces training and education officer and may include unintentional dictation errors.    Merlyn Lot, MD 02/03/18 2332    Merlyn Lot, MD 02/03/18 2352

## 2018-02-03 NOTE — Discharge Instructions (Addendum)
Please return to the ER if you develop any dark or bloody stools.  Follow-up with PCP.  I have written a prescription for iron to be taken every other day.  Return for any additional questions or concerns or for any worsening chest pain or shortness of breath.  It was a pleasure to take care of you today, and thank you for coming to our emergency department.  If you have any questions or concerns before leaving please ask the nurse to grab me and I'm more than happy to go through your aftercare instructions again.  If you were prescribed any opioid pain medication today such as Norco, Vicodin, Percocet, morphine, hydrocodone, or oxycodone please make sure you do not drive when you are taking this medication as it can alter your ability to drive safely.  If you have any concerns once you are home that you are not improving or are in fact getting worse before you can make it to your follow-up appointment, please do not hesitate to call 911 and come back for further evaluation.  Darel Hong, MD  Results for orders placed or performed during the hospital encounter of 02/03/18  CBC with Differential/Platelet  Result Value Ref Range   WBC 10.0 3.6 - 11.0 K/uL   RBC 2.93 (L) 3.80 - 5.20 MIL/uL   Hemoglobin 6.7 (L) 12.0 - 16.0 g/dL   HCT 22.5 (L) 35.0 - 47.0 %   MCV 76.6 (L) 80.0 - 100.0 fL   MCH 22.9 (L) 26.0 - 34.0 pg   MCHC 29.9 (L) 32.0 - 36.0 g/dL   RDW 20.3 (H) 11.5 - 14.5 %   Platelets 357 150 - 440 K/uL   Neutrophils Relative % 69 %   Neutro Abs 6.9 (H) 1.4 - 6.5 K/uL   Lymphocytes Relative 16 %   Lymphs Abs 1.6 1.0 - 3.6 K/uL   Monocytes Relative 9 %   Monocytes Absolute 0.9 0.2 - 0.9 K/uL   Eosinophils Relative 4 %   Eosinophils Absolute 0.4 0 - 0.7 K/uL   Basophils Relative 2 %   Basophils Absolute 0.2 (H) 0 - 0.1 K/uL  Basic metabolic panel  Result Value Ref Range   Sodium 140 135 - 145 mmol/L   Potassium 4.3 3.5 - 5.1 mmol/L   Chloride 108 98 - 111 mmol/L   CO2 25 22 - 32  mmol/L   Glucose, Bld 127 (H) 70 - 99 mg/dL   BUN 35 (H) 8 - 23 mg/dL   Creatinine, Ser 1.58 (H) 0.44 - 1.00 mg/dL   Calcium 8.3 (L) 8.9 - 10.3 mg/dL   GFR calc non Af Amer 29 (L) >60 mL/min   GFR calc Af Amer 34 (L) >60 mL/min   Anion gap 7 5 - 15  CBC  Result Value Ref Range   WBC 10.4 3.6 - 11.0 K/uL   RBC 3.76 (L) 3.80 - 5.20 MIL/uL   Hemoglobin 9.1 (L) 12.0 - 16.0 g/dL   HCT 29.5 (L) 35.0 - 47.0 %   MCV 78.4 (L) 80.0 - 100.0 fL   MCH 24.3 (L) 26.0 - 34.0 pg   MCHC 31.0 (L) 32.0 - 36.0 g/dL   RDW 18.9 (H) 11.5 - 14.5 %   Platelets 330 150 - 440 K/uL  Troponin I  Result Value Ref Range   Troponin I <0.03 <0.03 ng/mL  Troponin I  Result Value Ref Range   Troponin I <0.03 <0.03 ng/mL  Type and screen [place]  Result  Value Ref Range   ABO/RH(D) A POS    Antibody Screen NEG    Sample Expiration 02/06/2018    Unit Number [id number]    Blood Component Type RED CELLS,LR    Unit division 00    Status of Unit ISSUED    Transfusion Status OK TO TRANSFUSE    Crossmatch Result Compatible    Unit Number [id number]    Blood Component Type RED CELLS,LR    Unit division 00    Status of Unit ISSUED    Transfusion Status OK TO TRANSFUSE    Crossmatch Result      Compatible Performed at [facility], [place], [place] [address]   Prepare RBC  Result Value Ref Range   Order Confirmation      ORDER PROCESSED BY BLOOD BANK Performed at [facility], [place], [place] [address]   [facility]  Result Value Ref Range   ISSUE DATE / TIME [id number]    Blood Product Unit Number [id number]    PRODUCT CODE [id number]    Unit Type and Rh 6200    Blood Product Expiration Date [id number]    ISSUE DATE / TIME [id number]    Blood Product Unit Number [id number]    PRODUCT CODE [id number]    Unit Type and Rh 1751    Blood Product Expiration Date [id number]    Dg Chest Portable 1 View  Result Date:  02/04/2018 CLINICAL DATA:  Shortness of breath and weakness. EXAM: PORTABLE CHEST 1 VIEW COMPARISON:  CT 02/15/2017 FINDINGS: Mild cardiomegaly. Aortic atherosclerosis. Diffuse interstitial coarsening. Pulmonary nodule in the left lung on prior CT not seen radiographically. Soft tissue attenuation partially obscures evaluation of the left lung base, no evidence of large pleural effusion. No pneumothorax. Bones are under mineralized. Prior right mastectomy. IMPRESSION: 1. Mild cardiomegaly. 2. Interstitial coarsening, at least some of which is chronic and seen on prior chest CT. An element of pulmonary edema considered. Electronically Signed   By: Keith Rake M.D.   On: 02/04/2018 00:35

## 2018-02-03 NOTE — ED Notes (Signed)
ED Provider at bedside. 

## 2018-02-03 NOTE — ED Notes (Addendum)
Charting in error

## 2018-02-03 NOTE — ED Triage Notes (Signed)
Pt to ED via EMS from [place] c/o dizziness and SOB.  Seen at PCP and told to come to ED d/t anemia and possibly needing a blood transfusion.  Pt presents A&Ox4, chest rise even and unlabored.

## 2018-02-04 ENCOUNTER — Encounter: Payer: Self-pay | Admitting: Internal Medicine

## 2018-02-04 ENCOUNTER — Emergency Department: Payer: Medicare Other

## 2018-02-04 DIAGNOSIS — E1122 Type 2 diabetes mellitus with diabetic chronic kidney disease: Secondary | ICD-10-CM | POA: Diagnosis not present

## 2018-02-04 DIAGNOSIS — R079 Chest pain, unspecified: Secondary | ICD-10-CM | POA: Diagnosis present

## 2018-02-04 LAB — TYPE AND SCREEN
ABO/RH(D): A POS
Antibody Screen: NEGATIVE
UNIT DIVISION: 0
Unit division: 0

## 2018-02-04 LAB — BPAM RBC
Blood Product Expiration Date: 201910072359
Blood Product Expiration Date: 201910072359
ISSUE DATE / TIME: 201909122200
ISSUE DATE / TIME: 201909121840
Unit Type and Rh: 6200
Unit Type and Rh: 6200

## 2018-02-04 LAB — TROPONIN I

## 2018-02-04 MED ORDER — MORPHINE SULFATE (PF) 2 MG/ML IV SOLN: 2.0000 mg | INTRAVENOUS | Status: DC | PRN

## 2018-02-04 MED ORDER — FUROSEMIDE 10 MG/ML IJ SOLN
40.0000 mg | Freq: Once | INTRAMUSCULAR | Status: AC
  Administered 2018-02-04: 40 mg via INTRAVENOUS
  Filled 2018-02-04: qty 4

## 2018-02-04 MED ORDER — ASPIRIN 81 MG PO CHEW: 81.0000 mg | CHEWABLE_TABLET | Freq: Every day | ORAL | Status: DC

## 2018-02-04 MED ORDER — NITROGLYCERIN 0.4 MG SL SUBL: 0.4000 mg | SUBLINGUAL_TABLET | SUBLINGUAL | Status: DC | PRN

## 2018-02-04 NOTE — ED Notes (Signed)
Pt was assisted to restroom by this EDT

## 2018-02-04 NOTE — ED Notes (Signed)
Called to give report. Nurse was unavailable at this time stated that she would called back to [phone]

## 2018-02-04 NOTE — ED Provider Notes (Signed)
The patient second troponin is negative has been observed in the emergency department more than 10 hours.  I discussed with the hospitalist and together the hospitalist, the patient, and myself agree that she is medically stable for outpatient management.   Darel Hong, MD 02/04/18 0400

## 2018-02-04 NOTE — ED Notes (Signed)
Reviewed discharge instructions, follow-up care, and prescriptions with patient. Patient verbalized understanding of all information reviewed. Patient stable, with no distress noted at this time.    

## 2018-02-04 NOTE — ED Notes (Signed)
Trop drew and sent to lab. Pt also given boxed lunch and cup of iced water per physician .

## 2018-02-04 NOTE — ED Notes (Signed)
Patient up to toilet with stand-by assist

## 2018-02-04 NOTE — Consult Note (Addendum)
[facility] [place] NAME: Andrea Wallace    MR#:  [id number]  DATE OF BIRTH:  1932-01-13  DATE OF ADMISSION:  02/03/2018  PRIMARY CARE PHYSICIAN: Kirk Ruths, MD   REQUESTING/REFERRING PHYSICIAN: Merlyn Lot, MD  CHIEF COMPLAINT:   Chief Complaint  Patient presents with  . Anemia    HISTORY OF PRESENT ILLNESS:  Andrea Wallace  is a 84 y.o. female with a known history of T2IDDM (w/ nephropathy), CKD III, HTN, HLD p/w symptomatic anemia. She endorses an ~1wk Hx of positional lightheadedness/near-syncope, associated predominantly with transition from lying/sitting to standing. She states that the first noticed the symptoms on Monday (09/09); she got up from the chair, walked three steps, started shaking all over, and states she needed to lie down before she lost consciousness. She stayed in bed for a short time, and states she felt better. She had two similar episodes on Wednesday (09/11). She states she saw her PCP, and had bloodwork done. She says staff from her assisted living facility told her later in the day that she needed to go to the hospital for evaluation (ostensibly due to Hgb being 6.7 on labwork). She received 2u pRBC upon arrival to the ED. Her f/up Hgb was 9.1. She endorsed symptomatic improvement. She states she was able to get up from bed and walk to the bathroom w/o symptoms. However, pt was noted to have non-specific ST-segment changes on EKG, persistent after pRBC txfn. Pt denies CP. She endorses SOB, chronic x1.54mo, no worse on the date of admission. She endorses chronic cough, non-productive. She endorses a similar 1.58mo Hx of fatigue/malaise/decreased exercise tolerance. She states she feels reasonably well, and does not feel any worse today. She denies worsening SOB after receiving pRBC txfn. She denies palpitations, diaphoresis, N/V, LOC. She denies personal Hx of cardiac disease, but does endorse a (+) FHx of CAD/MI.  She has Hx of DM, HTN, HLD. Her cardiac exam is normal. She has two negative Troponins. EKG demonstrates < 8mm ST depression in leads II, aVF, V5 and V6. The pt says she wishes to go home. She is already scheduled to see her PCP on Friday 02/11/2018 (1wk from today). She is comfortable, pain-free and in no acute distress at the time of my assessment.  PAST MEDICAL HISTORY:   Past Medical History:  Diagnosis Date  . Allergy   . Anemia   . Arthritis   . Asthma    stress related per pt  . Blood transfusion   . Breast cancer [facility]) 2011   Right  . Cancer ([place])    right breast  . Complication of anesthesia    diff  waking up  . Cough   . Diabetes mellitus   . Family history of adverse reaction to anesthesia    " SISTER HAS DIFFICULTY WAKING "  . Generalized headaches   . History of kidney stones   . Hyperlipidemia   . Lung nodules   . Nasal congestion   . Neuromuscular disorder ([place])    neuropathy feet/legs/hands    PAST SURGICAL HISTORY:   Past Surgical History:  Procedure Laterality Date  . BACK SURGERY    . BREAST SURGERY  06/2007   right mastectomy  . BREAST SURGERY  2011   left  . FOOT SURGERY    . KIDNEY STONE SURGERY    . LITHOTRIPSY    . MASTECTOMY Right 2011   with Radiaiton  . ORIF ELBOW  FRACTURE Right 10/28/2016   Procedure: OPEN REDUCTION INTERNAL FIXATION (ORIF) ELBOW/OLECRANON FRACTURE;  Surgeon: Dorna Leitz, MD;  Location: [facility];  Service: Orthopedics;  Laterality: Right;  . REDUCTION MAMMAPLASTY Left 2012  . SHOULDER ARTHROSCOPY     bilateral  . TOTAL KNEE ARTHROPLASTY    . VISCERAL ANGIOGRAPHY N/A 08/06/2016   Procedure: Visceral Angiography;  Surgeon: Algernon Huxley, MD;  Location: [facility];  Service: Cardiovascular;  Laterality: N/A;  . VISCERAL ARTERY INTERVENTION N/A 08/06/2016   Procedure: Visceral Artery Intervention;  Surgeon: Algernon Huxley, MD;  Location: [facility];  Service: Cardiovascular;  Laterality: N/A;    SOCIAL HISTORY:    Social History   Tobacco Use  . Smoking status: Former Smoker    Last attempt to quit: 03/15/1984    Years since quitting: 33.9  . Smokeless tobacco: Never Used  Substance Use Topics  . Alcohol use: Yes    Comment: occ    FAMILY HISTORY:   Family History  Problem Relation Age of Onset  . Osteoporosis Mother   . Heart disease Father   . Cancer Brother   . Breast cancer Brother   . Heart disease Sister     DRUG ALLERGIES:   Allergies  Allergen Reactions  . Ciprofloxacin Diarrhea and Swelling  . Codeine Sulfate Itching    REVIEW OF SYSTEMS:   Review of Systems  Constitutional: Positive for malaise/fatigue. Negative for chills, diaphoresis, fever and weight loss.  HENT: Negative for congestion, ear pain, hearing loss, nosebleeds, sinus pain, sore throat and tinnitus.   Eyes: Negative for blurred vision, double vision and photophobia.  Respiratory: Positive for cough (non-productive) and shortness of breath (chronic x1.75mo, no worse than prior). Negative for hemoptysis, sputum production and wheezing.   Cardiovascular: Negative for chest pain, palpitations, orthopnea, claudication, leg swelling and PND.  Gastrointestinal: Negative for abdominal pain, blood in stool, constipation, diarrhea, heartburn, melena, nausea and vomiting.  Genitourinary: Negative for dysuria, frequency, hematuria and urgency.  Musculoskeletal: Negative for back pain, joint pain, myalgias and neck pain.  Skin: Negative for itching and rash.  Neurological: Positive for tremors ("shaking" at home (as per HPI)) and weakness. Negative for dizziness, tingling, sensory change, speech change, focal weakness, seizures, loss of consciousness and headaches.  Psychiatric/Behavioral: Negative for memory loss. The patient does not have insomnia.    MEDICATIONS AT HOME:   Prior to Admission medications   Medication Sig Start Date End Date Taking? Authorizing Provider  acetaminophen (TYLENOL) 500 MG tablet  Take 500 mg by mouth daily.     [provider]  aspirin 81 MG tablet Take 81 mg by mouth at bedtime.     [provider]  atorvastatin (LIPITOR) 80 MG tablet TAKE 1 TABLET BY MOUTH ONCE DAILY 12/21/16   [provider]  cholecalciferol (VITAMIN D) 1000 units tablet Take 1,000 Units by mouth daily.    [provider]  Continuous Blood Gluc Sensor (FREESTYLE LIBRE 14 DAY SENSOR) MISC 1 Units by Does not apply route every 14 (fourteen) days. 08/16/17   Elayne Snare, MD  DULoxetine (CYMBALTA) 60 MG capsule TAKE ONE CAPSULE BY MOUTH ONCE DAILY 02/03/17   Elayne Snare, MD  Ferrous Sulfate (IRON) 325 (65 Fe) MG TABS Take 1 tablet (325 mg total) by mouth every other day. 02/03/18   Merlyn Lot, MD  gabapentin (NEURONTIN) 600 MG tablet TAKE ONE TABLET BY MOUTH THREE TIMES DAILY 12/07/16   Elayne Snare, MD  glucose blood (FREESTYLE  LITE) test strip Use to test blood sugar 3 times daily dx code. E11.9 01/28/17   Elayne Snare, MD  HYDROcodone-acetaminophen (NORCO) 10-325 MG tablet Take 1 tablet by mouth 2 (two) times daily as needed for moderate pain. 12/01/17   Elayne Snare, MD  insulin NPH Human (NOVOLIN N RELION) 100 UNIT/ML injection Inject 40 units before breakfast and 10 units at bedtime 09/02/17   Renato Shin, MD  insulin regular (NOVOLIN R RELION) 100 units/mL injection Use 10 units in the morning and 20 units at supper 09/02/17   Renato Shin, MD  montelukast (SINGULAIR) 10 MG tablet Take 10 mg by mouth at bedtime.  03/02/11   [provider]  propranolol (INDERAL) 40 MG tablet Take 20 mg by mouth 2 (two) times daily.     [provider]      VITAL SIGNS:  Blood pressure 113/75, pulse 80, temperature 98.4 F (36.9 C), resp. rate (!) 22, height 5\' 5"  (1.651 m), weight 68 kg, SpO2 94 %.  PHYSICAL EXAMINATION:  Physical Exam  Constitutional: She is oriented to person, place, and time. She appears well-developed and well-nourished. She is active and  cooperative.  Non-toxic appearance. She does not have a sickly appearance. She does not appear ill. No distress.  HENT:  Head: Normocephalic and atraumatic.  Mouth/Throat: Oropharynx is clear and moist. No oropharyngeal exudate.  Eyes: Conjunctivae, EOM and lids are normal. No scleral icterus.  Neck: Normal range of motion. Neck supple. No JVD present. No thyromegaly present.  Cardiovascular: Normal rate, regular rhythm, S1 normal, S2 normal and normal heart sounds.  Occasional extrasystoles are present. Exam reveals no gallop, no S3, no S4, no distant heart sounds and no friction rub.  No murmur heard. Pulmonary/Chest: Effort normal and breath sounds normal. No stridor. No respiratory distress. She has no wheezes. She has no rales.  Abdominal: Soft. Bowel sounds are normal. She exhibits no distension. There is no tenderness. There is no rebound and no guarding.  Musculoskeletal: Normal range of motion. She exhibits no edema or tenderness.  Lymphadenopathy:    She has no cervical adenopathy.  Neurological: She is alert and oriented to person, place, and time.  Skin: Skin is warm and dry. No rash noted. She is not diaphoretic. No erythema.  Psychiatric: She has a normal mood and affect. Her behavior is normal. Judgment and thought content normal.   Non-tachycardic and non-tachypneic on exam. Occasional premature beats, (-) murmurs/rubs/gallops, lung sounds normal. Not in distress. LABORATORY PANEL:   CBC Recent Labs  Lab 02/03/18 2021  WBC 10.4  HGB 9.1*  HCT 29.5*  PLT 330   ------------------------------------------------------------------------------------------------------------------  Chemistries  Recent Labs  Lab 02/03/18 1738  NA 140  K 4.3  CL 108  CO2 25  GLUCOSE 127*  BUN 35*  CREATININE 1.58*  CALCIUM 8.3*   ------------------------------------------------------------------------------------------------------------------  Cardiac Enzymes Recent Labs  Lab  02/04/18 0156  TROPONINI <0.03   ------------------------------------------------------------------------------------------------------------------  RADIOLOGY:  Dg Chest Portable 1 View  Result Date: 02/04/2018 CLINICAL DATA:  Shortness of breath and weakness. EXAM: PORTABLE CHEST 1 VIEW COMPARISON:  CT 02/15/2017 FINDINGS: Mild cardiomegaly. Aortic atherosclerosis. Diffuse interstitial coarsening. Pulmonary nodule in the left lung on prior CT not seen radiographically. Soft tissue attenuation partially obscures evaluation of the left lung base, no evidence of large pleural effusion. No pneumothorax. Bones are under mineralized. Prior right mastectomy. IMPRESSION: 1. Mild cardiomegaly. 2. Interstitial coarsening, at least some of which is chronic and seen on prior chest CT. An  element of pulmonary edema considered. Electronically Signed   By: Keith Rake M.D.   On: 02/04/2018 00:35   IMPRESSION AND PLAN:   A/P: 51F p/w symptomatic anemia, s/p 2u pRBC, Hgb improved from 6.7 to 9.1. Non-specific ST-segment changes on EKG. Now asymptomatic. -Symptomatic anemia: Hgb 6.7 on admission. Improved to 9.1 s/p 2u pRBC txfn in ED. Positional lightheadedness/near-syncope resolved, pt was able to get up from bed and ambulate to bathroom w/o symptoms. Low MCV, suspect iron deficiency. -EKG changes: There was concern for ischemic heart disease based on persistence of non-specific ST-segment changes. EKG demonstrates < 64mm ST depressions in leads II, aVF, V5 and V6. She does not have any active CP/SOB. She has two negative Troponins ~8hr apart. The narrative is not suggestive of ACS. Though I suspect pt likely does have some degree of underlying multivessel non-occlusive ASCVD, as owing to her personal Hx of DM, HTN, HLD, paired with advanced age, gender, and (+) FHx; I do not, however, suspect active/acute cardiopulmonary pathology of a severity sufficient to justify/necessitate inpatient  hospitalization. -Cough/SOB: Chronic. Lungs sound clear. CXR demonstrates, "Mild cardiomegaly. Interstitial coarsening, at least some of which is chronic and seen on prior chest CT. An element of pulmonary edema considered." Received Lasix s/p pRBC txfn. Denies acute/worsening SOB. SpO2 stable. -D/C home -PCP f/up in 1wk (already scheduled for 02/11/2018) -May benefit from outpt Echo for evaluation of chronic dyspnea -Consider outpt non-ambulating nuclear pharmacological stress test, outpt Cardiology referral   All the records are reviewed and case discussed with ED provider. Management plans discussed with the patient, family and they are in agreement.  CODE STATUS: Full code.  TOTAL TIME TAKING CARE OF THIS PATIENT: 100 minutes.    Arta Silence M.D on 02/04/2018 at 4:29 AM  Between 7am to 6pm - Pager - [phone]  After 6pm go to [email or web address] - [employer]wer [facility]  Office  [phone]  CC: Primary care physician; Kirk Ruths, MD   Note: This dictation was prepared with Dragon dictation along with smaller phrase technology. Any transcriptional errors that result from this process are unintentional.

## 2018-02-05 LAB — PREPARE RBC (CROSSMATCH)

## 2018-02-11 DIAGNOSIS — I951 Orthostatic hypotension: Secondary | ICD-10-CM | POA: Insufficient documentation

## 2018-02-21 NOTE — Progress Notes (Signed)
[facility]  [phone] [phone]  ID: Andrea Wallace OB: [birth year]  MR#: [id number]  KXF#:[id number]  Patient Care Team: Kirk Ruths, MD as PCP - General (Unknown Physician Specialty)  CHIEF COMPLAINT: Anemia associated with acute blood loss. Left lower lobe lung mass, history of breast cancer.  INTERVAL HISTORY: Patient returns to clinic today as an add-on after requiring several units of packed red blood cells for severe anemia of 6.7 approximately 3 weeks ago.  She continues to have chronic weakness and fatigue, but feels this significantly improved.  She has no neurologic complaints. She denies any chest pain, cough, hemoptysis, or shortness of breath. She has no neurologic complaints. She denies any recent fevers or illnesses. She has a good appetite and denies any weight loss. She has no nausea, vomiting, constipation, or diarrhea.  She has noted no melena or hematochezia.  She has no urinary complaints.  Patient offers no further specific complaints today.   REVIEW OF SYSTEMS:   Review of Systems  Constitutional: Positive for malaise/fatigue. Negative for fever and weight loss.  Respiratory: Negative.  Negative for cough, hemoptysis, sputum production and shortness of breath.   Cardiovascular: Negative.  Negative for chest pain and leg swelling.  Gastrointestinal: Negative.  Negative for abdominal pain, blood in stool and melena.  Genitourinary: Negative.  Negative for dysuria and hematuria.  Musculoskeletal: Negative.  Negative for back pain.  Skin: Negative.  Negative for rash.  Neurological: Positive for weakness. Negative for sensory change and focal weakness.  Psychiatric/Behavioral: Negative.  The patient is not nervous/anxious and does not have insomnia.     As per HPI. Otherwise, a complete review of systems is negative.  PAST MEDICAL HISTORY: Past Medical History:  Diagnosis Date  . Allergy   . Anemia   . Arthritis    . Asthma    stress related per pt  . Blood transfusion   . Breast cancer [facility]) 2011   Right  . Cancer ([place])    right breast  . Complication of anesthesia    diff  waking up  . Cough   . Diabetes mellitus   . Family history of adverse reaction to anesthesia    " SISTER HAS DIFFICULTY WAKING "  . Generalized headaches   . History of kidney stones   . Hyperlipidemia   . Lung nodules   . Nasal congestion   . Neuromuscular disorder ([place])    neuropathy feet/legs/hands    PAST SURGICAL HISTORY: Past Surgical History:  Procedure Laterality Date  . BACK SURGERY    . BREAST SURGERY  06/2007   right mastectomy  . BREAST SURGERY  2011   left  . FOOT SURGERY    . KIDNEY STONE SURGERY    . LITHOTRIPSY    . MASTECTOMY Right 2011   with Radiaiton  . ORIF ELBOW FRACTURE Right 10/28/2016   Procedure: OPEN REDUCTION INTERNAL FIXATION (ORIF) ELBOW/OLECRANON FRACTURE;  Surgeon: Dorna Leitz, MD;  Location: [facility];  Service: Orthopedics;  Laterality: Right;  . REDUCTION MAMMAPLASTY Left 2012  . SHOULDER ARTHROSCOPY     bilateral  . TOTAL KNEE ARTHROPLASTY    . VISCERAL ANGIOGRAPHY N/A 08/06/2016   Procedure: Visceral Angiography;  Surgeon: Algernon Huxley, MD;  Location: [facility];  Service: Cardiovascular;  Laterality: N/A;  . VISCERAL ARTERY INTERVENTION N/A 08/06/2016   Procedure: Visceral Artery Intervention;  Surgeon: Algernon Huxley, MD;  Location: [place] [facility];  Service: Cardiovascular;  Laterality:  N/A;    FAMILY HISTORY Family History  Problem Relation Age of Onset  . Osteoporosis Mother   . Heart disease Father   . Cancer Brother   . Breast cancer Brother   . Heart disease Sister        ADVANCED DIRECTIVES:    HEALTH MAINTENANCE: Social History   Tobacco Use  . Smoking status: Former Smoker    Last attempt to quit: 03/15/1984    Years since quitting: 33.9  . Smokeless tobacco: Never Used  Substance Use Topics  . Alcohol use: Yes    Comment: occ  .  Drug use: No     Colonoscopy:  PAP:  Bone density:  Lipid panel:  Allergies  Allergen Reactions  . Ciprofloxacin Diarrhea and Swelling  . Codeine Sulfate Itching    Current Outpatient Medications  Medication Sig Dispense Refill  . acetaminophen (TYLENOL) 500 MG tablet Take 500 mg by mouth daily.     Marland Kitchen albuterol (PROVENTIL HFA;VENTOLIN HFA) 108 (90 Base) MCG/ACT inhaler Inhale into the lungs.    Marland Kitchen amLODipine (NORVASC) 5 MG tablet Take by mouth.    Marland Kitchen aspirin 81 MG tablet Take 81 mg by mouth at bedtime.     Marland Kitchen atorvastatin (LIPITOR) 80 MG tablet TAKE 1 TABLET BY MOUTH ONCE DAILY    . cholecalciferol (VITAMIN D) 1000 units tablet Take 1,000 Units by mouth daily.    . Continuous Blood Gluc Sensor (FREESTYLE LIBRE 14 DAY SENSOR) MISC 1 Units by Does not apply route every 14 (fourteen) days. 2 each 4  . DULoxetine (CYMBALTA) 60 MG capsule TAKE ONE CAPSULE BY MOUTH ONCE DAILY 30 capsule 2  . Ferrous Sulfate (IRON) 325 (65 Fe) MG TABS Take 1 tablet (325 mg total) by mouth every other day. 30 each 1  . gabapentin (NEURONTIN) 600 MG tablet TAKE ONE TABLET BY MOUTH THREE TIMES DAILY 90 tablet 5  . glucose blood (FREESTYLE LITE) test strip Use to test blood sugar 3 times daily dx code. E11.9 100 each 5  . HYDROcodone-acetaminophen (NORCO) 10-325 MG tablet Take 1 tablet by mouth 2 (two) times daily as needed for moderate pain. 100 tablet 0  . insulin NPH Human (NOVOLIN N RELION) 100 UNIT/ML injection Inject 40 units before breakfast and 10 units at bedtime 20 mL 3  . insulin regular (NOVOLIN R RELION) 100 units/mL injection Use 10 units in the morning and 20 units at supper 20 mL 3  . midodrine (PROAMATINE) 5 MG tablet Take by mouth.    . montelukast (SINGULAIR) 10 MG tablet Take 10 mg by mouth at bedtime.     . propranolol (INDERAL) 40 MG tablet Take 20 mg by mouth 2 (two) times daily.      No current facility-administered medications for this visit.     OBJECTIVE: Vitals:   02/24/18 1107    BP: 119/71  Pulse: (!) 106  Resp: 20  Temp: (!) 97 F (36.1 C)     Body mass index is 25.64 kg/m.    ECOG FS:1 - Symptomatic but completely ambulatory  General: Well-developed, well-nourished, no acute distress. Eyes: Pink conjunctiva, anicteric sclera. HEENT: Normocephalic, moist mucous membranes, clear oropharnyx. Lungs: Clear to auscultation bilaterally. Heart: Regular rate and rhythm. No rubs, murmurs, or gallops. Abdomen: Soft, nontender, nondistended. No organomegaly noted, normoactive bowel sounds. Musculoskeletal: No edema, cyanosis, or clubbing. Neuro: Alert, answering all questions appropriately. Cranial nerves grossly intact. Skin: No rashes or petechiae noted. Psych: Normal affect.  LAB RESULTS:  Lab  Results  Component Value Date   NA 140 02/03/2018   K 4.3 02/03/2018   CL 108 02/03/2018   CO2 25 02/03/2018   GLUCOSE 127 (H) 02/03/2018   BUN 35 (H) 02/03/2018   CREATININE 1.58 (H) 02/03/2018   CALCIUM 8.3 (L) 02/03/2018   PROT 7.0 08/16/2017   ALBUMIN 3.1 (L) 08/16/2017   AST 19 08/16/2017   ALT 15 08/16/2017   ALKPHOS 108 08/16/2017   BILITOT 0.2 08/16/2017   GFRNONAA 29 (L) 02/03/2018   GFRAA 34 (L) 02/03/2018    Lab Results  Component Value Date   WBC 10.8 02/24/2018   NEUTROABS 8.4 (H) 02/24/2018   HGB 9.5 (L) 02/24/2018   HCT 30.3 (L) 02/24/2018   MCV 84.0 02/24/2018   PLT 375 02/24/2018   Lab Results  Component Value Date   LABCA2 26.0 12/02/2015     STUDIES: Dg Chest Portable 1 View  Result Date: 02/04/2018 CLINICAL DATA:  Shortness of breath and weakness. EXAM: PORTABLE CHEST 1 VIEW COMPARISON:  CT 02/15/2017 FINDINGS: Mild cardiomegaly. Aortic atherosclerosis. Diffuse interstitial coarsening. Pulmonary nodule in the left lung on prior CT not seen radiographically. Soft tissue attenuation partially obscures evaluation of the left lung base, no evidence of large pleural effusion. No pneumothorax. Bones are under mineralized. Prior right  mastectomy. IMPRESSION: 1. Mild cardiomegaly. 2. Interstitial coarsening, at least some of which is chronic and seen on prior chest CT. An element of pulmonary edema considered. Electronically Signed   By: Keith Rake M.D.   On: 02/04/2018 00:35    ASSESSMENT: Anemia associated with acute blood loss. Left lower lobe lung mass, history of breast cancer.  PLAN:    1. Anemia associated with acute blood loss: Patient's hemoglobin is significantly improved over 3 weeks ago at 9.5.  Her iron stores are within normal limits.  She has no evidence of hemolysis and her folate levels are normal.  She is noted to have a decreased B12 level.  She does not require IV Feraheme today.  Patient last received treatment on June 29, 2017.  Return to clinic next week for B12 injection and then in 5 weeks for repeat laboratory work, further evaluation, and continuation of B12.   2. Left lower lobe lung mass: CT results from February 15, 2017 reviewed independently with left lower lobe nodule unchanged from previous.  She was noted to have slowly progressive mediastinal and hilar adenopathy of indeterminate etiology.  Patient has not had repeat chest CT since this time.  Will consider in the next several months.     3. History of breast cancer: Continue mammograms as ordered. CA-27-29 is within normal limits.  4. Hyperglycemia: Continue current diabetic medications.  I spent a total of 30 minutes face-to-face with the patient of which greater than 50% of the visit was spent in counseling and coordination of care as detailed above.    Patient expressed understanding and was in agreement with this plan. She also understands that She can call clinic at any time with any questions, concerns, or complaints.    Lloyd Huger, MD   02/27/2018 8:37 AM

## 2018-02-24 ENCOUNTER — Encounter: Payer: Self-pay | Admitting: Oncology

## 2018-02-24 ENCOUNTER — Inpatient Hospital Stay: Payer: Medicare Other

## 2018-02-24 ENCOUNTER — Inpatient Hospital Stay: Payer: Medicare Other | Attending: Oncology | Admitting: Oncology

## 2018-02-24 VITALS — BP 119/71 | HR 106 | Temp 97.0°F | Resp 20 | Wt 154.1 lb

## 2018-02-24 DIAGNOSIS — Z853 Personal history of malignant neoplasm of breast: Secondary | ICD-10-CM | POA: Diagnosis not present

## 2018-02-24 DIAGNOSIS — E1165 Type 2 diabetes mellitus with hyperglycemia: Secondary | ICD-10-CM | POA: Insufficient documentation

## 2018-02-24 DIAGNOSIS — D62 Acute posthemorrhagic anemia: Secondary | ICD-10-CM

## 2018-02-24 DIAGNOSIS — R918 Other nonspecific abnormal finding of lung field: Secondary | ICD-10-CM | POA: Diagnosis not present

## 2018-02-24 LAB — CBC WITH DIFFERENTIAL/PLATELET
BASOS ABS: 0.1 10*3/uL (ref 0–0.1)
BASOS PCT: 1 %
EOS PCT: 3 %
Eosinophils Absolute: 0.4 10*3/uL (ref 0–0.7)
HCT: 30.3 % — ABNORMAL LOW (ref 35.0–47.0)
Hemoglobin: 9.5 g/dL — ABNORMAL LOW (ref 12.0–16.0)
LYMPHS PCT: 12 %
Lymphs Abs: 1.3 10*3/uL (ref 1.0–3.6)
MCH: 26.4 pg (ref 26.0–34.0)
MCHC: 31.4 g/dL — ABNORMAL LOW (ref 32.0–36.0)
MCV: 84 fL (ref 80.0–100.0)
MONO ABS: 0.7 10*3/uL (ref 0.2–0.9)
Monocytes Relative: 6 %
Neutro Abs: 8.4 10*3/uL — ABNORMAL HIGH (ref 1.4–6.5)
Neutrophils Relative %: 78 %
PLATELETS: 375 10*3/uL (ref 150–440)
RBC: 3.61 MIL/uL — ABNORMAL LOW (ref 3.80–5.20)
RDW: 23.9 % — AB (ref 11.5–14.5)
WBC: 10.8 10*3/uL (ref 3.6–11.0)

## 2018-02-24 LAB — LACTATE DEHYDROGENASE: LDH: 222 U/L — ABNORMAL HIGH (ref 98–192)

## 2018-02-24 LAB — SAMPLE TO BLOOD BANK

## 2018-02-24 LAB — FOLATE: Folate: 17.9 ng/mL (ref >5.9)

## 2018-02-24 LAB — IRON AND TIBC
IRON: 110 ug/dL (ref 28–170)
SATURATION RATIOS: 30 % (ref 10.4–31.8)
TIBC: 364 ug/dL (ref 250–450)
UIBC: 254 ug/dL

## 2018-02-24 LAB — FERRITIN: Ferritin: 28 ng/mL (ref 11–307)

## 2018-02-24 NOTE — Progress Notes (Signed)
Patient here today for evaluation regarding anemia.

## 2018-02-25 LAB — HAPTOGLOBIN: HAPTOGLOBIN: 231 mg/dL — AB (ref 34–200)

## 2018-02-25 LAB — VITAMIN B12: VITAMIN B 12: 146 pg/mL — AB (ref 180–914)

## 2018-03-02 ENCOUNTER — Telehealth: Payer: Self-pay | Admitting: *Deleted

## 2018-03-02 NOTE — Telephone Encounter (Signed)
Patient Aide called asking for results of labs Patient has not follow up appts scheduled.   Ref Range & Units 6d ago  Ferritin 11 - 307 ng/mL 28   Comment: Performed at [facility], [address] [place], [place] [address]  Resulting Agency  [facility]      Specimen Collected: 02/24/18 11:30        Ref Range & Units 6d ago  Vitamin B-12 180 - 914 pg/mL 146Low           Ref Range & Units 6d ago  Folate >5.9 ng/mL 17.9         Ref Range & Units 6d ago  Haptoglobin 34 - 200 mg/dL 231High          Ref Range & Units 6d ago  LDH 98 - 192 U/L 222High          Ref Range & Units 6d ago  Iron 28 - 170 ug/dL 110   TIBC 250 - 450 ug/dL 364   Saturation Ratios 10.4 - 31.8 % 30   UIBC ug/dL 254          Ref Range & Units 6d ago  WBC 3.6 - 11.0 K/uL 10.8   RBC 3.80 - 5.20 MIL/uL 3.61Low    Hemoglobin 12.0 - 16.0 g/dL 9.5Low    HCT 35.0 - 47.0 % 30.3Low    MCV 80.0 - 100.0 fL 84.0   MCH 26.0 - 34.0 pg 26.4   MCHC 32.0 - 36.0 g/dL 31.4Low    RDW 11.5 - 14.5 % 23.9High    Platelets 150 - 440 K/uL 375   Neutrophils Relative % % 78   Neutro Abs 1.4 - 6.5 K/uL 8.4High    Lymphocytes Relative % 12   Lymphs Abs 1.0 - 3.6 K/uL 1.3   Monocytes Relative % 6   Monocytes Absolute 0.2 - 0.9 K/uL 0.7   Eosinophils Relative % 3   Eosinophils Absolute 0 - 0.7 K/uL 0.4   Basophils Relative % 1   Basophils Absolute 0 - 0.1 K/uL 0.1

## 2018-03-07 ENCOUNTER — Inpatient Hospital Stay: Payer: Medicare Other

## 2018-03-07 DIAGNOSIS — D62 Acute posthemorrhagic anemia: Secondary | ICD-10-CM | POA: Diagnosis not present

## 2018-03-07 MED ORDER — CYANOCOBALAMIN 1000 MCG/ML IJ SOLN
1000.0000 ug | Freq: Once | INTRAMUSCULAR | Status: AC
  Administered 2018-03-07: 1000 ug via INTRAMUSCULAR
  Filled 2018-03-07: qty 1

## 2018-03-08 ENCOUNTER — Ambulatory Visit (INDEPENDENT_AMBULATORY_CARE_PROVIDER_SITE_OTHER): Payer: Medicare Other | Admitting: Endocrinology

## 2018-03-08 ENCOUNTER — Encounter: Payer: Self-pay | Admitting: Endocrinology

## 2018-03-08 VITALS — BP 116/82 | HR 94 | Ht 65.0 in | Wt 153.0 lb

## 2018-03-08 DIAGNOSIS — E1165 Type 2 diabetes mellitus with hyperglycemia: Secondary | ICD-10-CM

## 2018-03-08 DIAGNOSIS — Z794 Long term (current) use of insulin: Secondary | ICD-10-CM

## 2018-03-08 LAB — POCT GLYCOSYLATED HEMOGLOBIN (HGB A1C): Hemoglobin A1C: 5.4 % (ref 4.0–5.6)

## 2018-03-08 NOTE — Progress Notes (Signed)
Patient ID: Andrea Wallace, female   DOB: [birth year], 84 y.o.   MRN: [id number]   Reason for Appointment: Diabetes follow-up   History of Present Illness   Diagnosis: Type 2 DIABETES MELITUS, date of diagnosis 1999  Previous history: She has been on insulin for several years to control her diabetes and usually requires large doses Has been on basal bolus regimen for the last few years, taking Lantus twice a day.  Her blood sugars tend to fluctuate significantly but her A1c is usually at a reasonable level She does not clearly benefit from GLP-1 drugs and had been taking Byetta to help with postprandial hyperglycemia, this has been stopped  She had been tried on Victoza, Byetta and Invokana previously and these were  stopped because of unclear benefit       RECENT history:  Insulin regimen:    Novolin N 40 in a.m, 10 at bedtime; REGULAR insulin 10 at breakfast, 20 BEFORE SUPPER   Oral hypoglycemic drugs: None  Her A1c is much lower than expected at 5.4, was at 6.4 She does have significant anemia  Current management, blood sugar patterns and problems identified:  Blood sugars are now being checked 4 times a day by the nursing home as requested  However patient says that her insulin doses are not being given on time and at times after eating  As usual her blood sugars fluctuate  FASTING blood sugars are reasonably good and relatively stable also without overnight hypoglycemia, lowest reading 75  Blood sugars are usually very high at lunchtime and frequently over 300; she said that she is eating cereal in addition to her eggs and bacon and a starch  Overall blood sugars are suppertime are mildly increased but not consistently  Hypoglycemia occurred only once recently with a glucose of 53 at suppertime  LOWEST blood sugars are at bedtime with her taking 20 units of regular insulin at suppertime and recently around 100  She is having somewhat variable intake because  sometimes she does not like the food that she is getting  Monitors blood glucose:  2 times a day on average.    Glucometer:  Unknown         Blood sugar readings by review of nursing home records:   PRE-MEAL Fasting Lunch Dinner Bedtime Overall  Glucose range: 75-164 219-398  53-251    Mean/median:        POST-MEAL PC Breakfast PC Lunch PC Dinner  Glucose range:    93-217  Mean/median:           Side effects from medications: None  Proper timing of medications in relation to meals: Yes.          Meals: 3 meals per day.   Breakfast meal as above mostly eating cheese crackers or yogurt at lunch and full meal at dinner Usually avoiding  drinks with sugar including juices          Physical activity: exercise: none, has back and leg pain            Dietician visit: Most recent: Years ago       Complications: are: Neuropathy, microalbuminuria     Wt Readings from Last 3 Encounters:  03/08/18 153 lb (69.4 kg)  02/24/18 154 lb 1.6 oz (69.9 kg)  02/03/18 150 lb (68 kg)     Lab Results  Component Value Date   HGBA1C 5.4 03/08/2018   HGBA1C 6.4 (A) 12/01/2017   HGBA1C 10.6 08/16/2017  Lab Results  Component Value Date   MICROALBUR 94.4 (H) 03/30/2017   LDLCALC 90 10/12/2017   CREATININE 1.58 (H) 02/03/2018    OTHER problems discussed today: See review of systems   Allergies as of 03/08/2018      Reactions   Ciprofloxacin Diarrhea, Swelling   Codeine Sulfate Itching      Medication List        Accurate as of 03/08/18  1:48 PM. Always use your most recent med list.          acetaminophen 500 MG tablet Commonly known as:  TYLENOL Take 1,000 mg by mouth daily.   albuterol 108 (90 Base) MCG/ACT inhaler Commonly known as:  PROVENTIL HFA;VENTOLIN HFA Inhale into the lungs.   amLODipine 5 MG tablet Commonly known as:  NORVASC Take by mouth.   aspirin 81 MG tablet Take 81 mg by mouth at bedtime.   atorvastatin 80 MG tablet Commonly known as:   LIPITOR TAKE 1 TABLET BY MOUTH ONCE DAILY   cholecalciferol 1000 units tablet Commonly known as:  VITAMIN D Take 1,000 Units by mouth daily.   DULoxetine 60 MG capsule Commonly known as:  CYMBALTA TAKE ONE CAPSULE BY MOUTH ONCE DAILY   FREESTYLE LIBRE 14 DAY SENSOR Misc 1 Units by Does not apply route every 14 (fourteen) days.   gabapentin 600 MG tablet Commonly known as:  NEURONTIN TAKE ONE TABLET BY MOUTH THREE TIMES DAILY   glucose blood test strip Use to test blood sugar 3 times daily dx code. E11.9   HYDROcodone-acetaminophen 10-325 MG tablet Commonly known as:  NORCO Take 1 tablet by mouth 2 (two) times daily as needed for moderate pain.   insulin NPH Human 100 UNIT/ML injection Commonly known as:  HUMULIN N,NOVOLIN N Inject 40 units before breakfast and 10 units at bedtime   insulin regular 100 units/mL injection Commonly known as:  NOVOLIN R,HUMULIN R Use 10 units in the morning and 20 units at supper   Iron 325 (65 Fe) MG Tabs Take 1 tablet (325 mg total) by mouth every other day.   midodrine 5 MG tablet Commonly known as:  PROAMATINE Take by mouth.   montelukast 10 MG tablet Commonly known as:  SINGULAIR Take 10 mg by mouth at bedtime.   propranolol 40 MG tablet Commonly known as:  INDERAL Take 20 mg by mouth 2 (two) times daily.   VOLTAREN 1 % Gel Generic drug:  diclofenac sodium Apply topically 4 (four) times daily.       Allergies:  Allergies  Allergen Reactions  . Ciprofloxacin Diarrhea and Swelling  . Codeine Sulfate Itching    Past Medical History:  Diagnosis Date  . Allergy   . Anemia   . Arthritis   . Asthma    stress related per pt  . Blood transfusion   . Breast cancer [facility]) 2011   Right  . Cancer ([place])    right breast  . Complication of anesthesia    diff  waking up  . Cough   . Diabetes mellitus   . Family history of adverse reaction to anesthesia    " SISTER HAS DIFFICULTY WAKING "  . Generalized headaches   .  History of kidney stones   . Hyperlipidemia   . Lung nodules   . Nasal congestion   . Neuromuscular disorder ([place])    neuropathy feet/legs/hands    Past Surgical History:  Procedure Laterality Date  . BACK SURGERY    . BREAST SURGERY  06/2007   right mastectomy  . BREAST SURGERY  2011   left  . FOOT SURGERY    . KIDNEY STONE SURGERY    . LITHOTRIPSY    . MASTECTOMY Right 2011   with Radiaiton  . ORIF ELBOW FRACTURE Right 10/28/2016   Procedure: OPEN REDUCTION INTERNAL FIXATION (ORIF) ELBOW/OLECRANON FRACTURE;  Surgeon: Dorna Leitz, MD;  Location: [facility];  Service: Orthopedics;  Laterality: Right;  . REDUCTION MAMMAPLASTY Left 2012  . SHOULDER ARTHROSCOPY     bilateral  . TOTAL KNEE ARTHROPLASTY    . VISCERAL ANGIOGRAPHY N/A 08/06/2016   Procedure: Visceral Angiography;  Surgeon: Algernon Huxley, MD;  Location: [place] [facility];  Service: Cardiovascular;  Laterality: N/A;  . VISCERAL ARTERY INTERVENTION N/A 08/06/2016   Procedure: Visceral Artery Intervention;  Surgeon: Algernon Huxley, MD;  Location: [place] [facility];  Service: Cardiovascular;  Laterality: N/A;    Family History  Problem Relation Age of Onset  . Osteoporosis Mother   . Heart disease Father   . Cancer Brother   . Breast cancer Brother   . Heart disease Sister     Social History:  reports that she quit smoking about 34 years ago. She has never used smokeless tobacco. She reports that she drinks alcohol. She reports that she does not use drugs.  Review of Systems:   Chronic kidney disease: Creatinine has been  upper normal, stable, no hypotension    Lab Results  Component Value Date   CREATININE 1.58 (H) 02/03/2018    HYPERLIPIDEMIA: The lipid abnormality consists of elevated  triglycerides, has been treated with TriCor Also followed by PCP Last triglycerides from PCP: 254     Lab Results  Component Value Date   CHOL 166 10/12/2017   HDL 40.00 10/12/2017   LDLCALC 90 10/12/2017   LDLDIRECT  116.0 03/30/2017   TRIG 180.0 (H) 10/12/2017   CHOLHDL 4 10/12/2017     She has chronic pains and paresthesia in her feet and lower legs Also has had Chronic back pain  She is taking Cymbalta  Also on gabapentin 600 mg   Refill on hydrocodone was given in 3/19, now she is in a nursing home She says that she mostly takes this at bedtime and occasionally when she is going out and being active during the day  Last diabetic foot exam was in 8/17 during some sensory loss  She has had a multinodular goiter, this has been euthyroid, Last TSH normal in 2/18 from PCP  Lab Results  Component Value Date   TSH 2.79 08/16/2017        Examination:   BP 116/82   Pulse 94   Ht 5\' 5"  (1.651 m)   Wt 153 lb (69.4 kg)   SpO2 95%   BMI 25.46 kg/m   Body mass index is 25.46 kg/m.     ASSESSMENT/ PLAN:   Diabetes type 2 on insulin:  See history of present illness for detailed discussion of  current management, blood sugar patterns and problems identified  Her blood sugars are overall somewhat better with her being in a nursing home in a controlled environment She is however having fluctuating blood sugars about the same despite increasing her insulin in the morning and reducing the bedtime dose She usually prefers to have Generic or inexpensive medications and although she may do better with U-500 insulin she probably cannot afford this  She has unusually high readings around suppertime, some of these may be right  after eating also This is despite her being fairly compliant with the morning injection and increasing her NPH by 10 units on the last visit Her diet has been fairly good and she is not getting excessive carbohydrate or drinks with sugar   Recommendations:  Will need to have her take insulin doses consistently  If she is eating away from home including at the lunch meal she will need to take regular insulin before eating consistently  Given written instructions for  insulin  She will need to keep a record of her insulin doses that she is taking and bring this to her next visit  Discussed timing of checking blood sugars  She will need to reduce her bedtime dose of NPH down to 15 units since she has a tendency to overnight hypoglycemia  To check blood sugars more consistently at different times per day, up to 4 times a day  She will be given a prescription for the [facility] sensor and also discussed Medicare guidelines for use  Microalbuminuria: Will recheck today    Patient Instructions  INSTRUCTIONS for insulin changes  INCREASE the Humulin R insulin in the morning to 14 units DECREASE the Humulin R insulin at dinnertime to 16 units  IMPORTANT: The Humulin R needs to be given before eating, preferably 30 minutes before the meal Do not hold insulin doses as per previous fax        Elayne Snare 03/08/2018, 1:48 PM      Note: This office note was prepared with Dragon voice recognition system technology. Any transcriptional errors that result from this process are unintentional.             Patient ID: Andrea Wallace, female   DOB: 07-Apr-1932, 84 y.o.   MRN: [id number]   Reason for Appointment: Diabetes follow-up   History of Present Illness   Diagnosis: Type 2 DIABETES MELITUS, date of diagnosis 1999  Previous history: She has been on insulin for several years to control her diabetes and usually requires large doses Has been on basal bolus regimen for the last few years, taking Lantus twice a day.  Her blood sugars tend to fluctuate significantly but her A1c is usually at a reasonable level She does not clearly benefit from GLP-1 drugs and had been taking Byetta to help with postprandial hyperglycemia, this has been stopped  She had been tried on Victoza, Byetta and Invokana previously and these were  stopped because of unclear benefit       RECENT history:  Insulin regimen:    Novolin N 50 in a.m, 25 at  bedtime REGULAR insulin 20 at breakfast, 30  BEFORE SUPPER    Her A1c has been 10.2 recently, generally around 7-8%  Current management, blood sugar patterns and problems identified:  She appears to have the same pattern of blood sugars before despite adjusting her insulin  Difficult to know why she has fluctuation in her blood sugars especially in the mornings even though she thinks she is trying to take her insulin regularly most of the time  She was given a diary to keep her blood sugar records and her insulin doses but not clear if she is doing this and has not brought this today  More recently her FASTING blood sugars have been closer to normal and not as consistently high; however it was 31 about 3 days ago and 321 the next day in the morning  Blood sugars are fairly consistently high in the evenings with only  a couple of readings below 200  Difficult to know whether she is checking her sugar before or after eating in the evening but appears to be checking it either right before, during our soon after her meals around 6-8 PM  No readings at bedtime  HYPOGLYCEMIA has been documented only once  Again blood sugars are higher than expected at suppertime despite her not eating much at lunchtime  Her diet has been about the same as also her weight  She will has been on metformin before and appears to be not taking it now  Monitors blood glucose:  2-3 times a day on average.    Glucometer: Freestyle          Blood sugar readings by monitor download, using FreeStyle meter  Mean values apply above for all meters except median for One Touch  PRE-MEAL Fasting Lunch Dinner Bedtime Overall  Glucose range: 31-366 ?  256  129-424    Mean/median: 200   320   238    Oral hypoglycemic drugs: Metformin ER, 500 mg 2 a day       Side effects from medications: None  Proper timing of medications in relation to meals: Yes.          Meals: 3 meals per day.   at breakfast she is eating a Half  bagel.  Mostly eating cheese crackers or yogurt at lunch and full meal at dinner Usually avoiding all drinks with sugar including juices          Physical activity: exercise: none, has back pain            Dietician visit: Most recent: Years ago       Complications: are: Neuropathy, microalbuminuria     Wt Readings from Last 3 Encounters:  03/08/18 153 lb (69.4 kg)  02/24/18 154 lb 1.6 oz (69.9 kg)  02/03/18 150 lb (68 kg)     Lab Results  Component Value Date   HGBA1C 5.4 03/08/2018   HGBA1C 6.4 (A) 12/01/2017   HGBA1C 10.6 08/16/2017   Lab Results  Component Value Date   MICROALBUR 94.4 (H) 03/30/2017   LDLCALC 90 10/12/2017   CREATININE 1.58 (H) 02/03/2018    OTHER problems discussed today: See review of systems   Allergies as of 03/08/2018      Reactions   Ciprofloxacin Diarrhea, Swelling   Codeine Sulfate Itching      Medication List        Accurate as of 03/08/18  1:48 PM. Always use your most recent med list.          acetaminophen 500 MG tablet Commonly known as:  TYLENOL Take 1,000 mg by mouth daily.   albuterol 108 (90 Base) MCG/ACT inhaler Commonly known as:  PROVENTIL HFA;VENTOLIN HFA Inhale into the lungs.   amLODipine 5 MG tablet Commonly known as:  NORVASC Take by mouth.   aspirin 81 MG tablet Take 81 mg by mouth at bedtime.   atorvastatin 80 MG tablet Commonly known as:  LIPITOR TAKE 1 TABLET BY MOUTH ONCE DAILY   cholecalciferol 1000 units tablet Commonly known as:  VITAMIN D Take 1,000 Units by mouth daily.   DULoxetine 60 MG capsule Commonly known as:  CYMBALTA TAKE ONE CAPSULE BY MOUTH ONCE DAILY   FREESTYLE LIBRE 14 DAY SENSOR Misc 1 Units by Does not apply route every 14 (fourteen) days.   gabapentin 600 MG tablet Commonly known as:  NEURONTIN TAKE ONE TABLET BY MOUTH THREE TIMES DAILY  glucose blood test strip Use to test blood sugar 3 times daily dx code. E11.9   HYDROcodone-acetaminophen 10-325 MG  tablet Commonly known as:  NORCO Take 1 tablet by mouth 2 (two) times daily as needed for moderate pain.   insulin NPH Human 100 UNIT/ML injection Commonly known as:  HUMULIN N,NOVOLIN N Inject 40 units before breakfast and 10 units at bedtime   insulin regular 100 units/mL injection Commonly known as:  NOVOLIN R,HUMULIN R Use 10 units in the morning and 20 units at supper   Iron 325 (65 Fe) MG Tabs Take 1 tablet (325 mg total) by mouth every other day.   midodrine 5 MG tablet Commonly known as:  PROAMATINE Take by mouth.   montelukast 10 MG tablet Commonly known as:  SINGULAIR Take 10 mg by mouth at bedtime.   propranolol 40 MG tablet Commonly known as:  INDERAL Take 20 mg by mouth 2 (two) times daily.   VOLTAREN 1 % Gel Generic drug:  diclofenac sodium Apply topically 4 (four) times daily.       Allergies:  Allergies  Allergen Reactions  . Ciprofloxacin Diarrhea and Swelling  . Codeine Sulfate Itching    Past Medical History:  Diagnosis Date  . Allergy   . Anemia   . Arthritis   . Asthma    stress related per pt  . Blood transfusion   . Breast cancer [facility]) 2011   Right  . Cancer ([place])    right breast  . Complication of anesthesia    diff  waking up  . Cough   . Diabetes mellitus   . Family history of adverse reaction to anesthesia    " SISTER HAS DIFFICULTY WAKING "  . Generalized headaches   . History of kidney stones   . Hyperlipidemia   . Lung nodules   . Nasal congestion   . Neuromuscular disorder ([place])    neuropathy feet/legs/hands    Past Surgical History:  Procedure Laterality Date  . BACK SURGERY    . BREAST SURGERY  06/2007   right mastectomy  . BREAST SURGERY  2011   left  . FOOT SURGERY    . KIDNEY STONE SURGERY    . LITHOTRIPSY    . MASTECTOMY Right 2011   with Radiaiton  . ORIF ELBOW FRACTURE Right 10/28/2016   Procedure: OPEN REDUCTION INTERNAL FIXATION (ORIF) ELBOW/OLECRANON FRACTURE;  Surgeon: Dorna Leitz, MD;  Location:  [place];  Service: Orthopedics;  Laterality: Right;  . REDUCTION MAMMAPLASTY Left 2012  . SHOULDER ARTHROSCOPY     bilateral  . TOTAL KNEE ARTHROPLASTY    . VISCERAL ANGIOGRAPHY N/A 08/06/2016   Procedure: Visceral Angiography;  Surgeon: Algernon Huxley, MD;  Location: [place] [facility];  Service: Cardiovascular;  Laterality: N/A;  . VISCERAL ARTERY INTERVENTION N/A 08/06/2016   Procedure: Visceral Artery Intervention;  Surgeon: Algernon Huxley, MD;  Location: [place] [facility];  Service: Cardiovascular;  Laterality: N/A;    Family History  Problem Relation Age of Onset  . Osteoporosis Mother   . Heart disease Father   . Cancer Brother   . Breast cancer Brother   . Heart disease Sister     Social History:  reports that she quit smoking about 34 years ago. She has never used smokeless tobacco. She reports that she drinks alcohol. She reports that she does not use drugs.  Review of Systems:   Chronic kidney disease: Creatinine has been  upper normal previously but appears  to be high now Followed by PCP Last urine microalbumin was high     Lab Results  Component Value Date   CREATININE 1.58 (H) 02/03/2018     HYPERLIPIDEMIA: The lipid abnormality consists of elevated  triglycerides, has been treated with TriCor Also followed by PCP Last LDL below 100   Lab Results  Component Value Date   CHOL 166 10/12/2017   HDL 40.00 10/12/2017   LDLCALC 90 10/12/2017   LDLDIRECT 116.0 03/30/2017   TRIG 180.0 (H) 10/12/2017   CHOLHDL 4 10/12/2017     She has chronic pains and paresthesia in her feet and lower legs Along with chronic back pain  She is taking her Cymbalta along with gabapentin 600 mg 3 times daily  Asking about occasional jerking episodes lasting for a minute or so She does take a inhaler for her breathing   Last diabetic foot exam was in 7/19 showing sensory loss  She has had a multinodular goiter, this has been euthyroid, Last TSH normal  Lab Results   Component Value Date   TSH 2.79 08/16/2017        Examination:   BP 116/82   Pulse 94   Ht 5\' 5"  (1.651 m)   Wt 153 lb (69.4 kg)   SpO2 95%   BMI 25.46 kg/m   Body mass index is 25.46 kg/m.      ASSESSMENT/ PLAN:   Diabetes type 2 on insulin:  See history of present illness for detailed discussion of  current management, blood sugar patterns and problems identified  Her A1c is 5.4 which is likely falsely low because of her anemia  Her blood sugars are probably averaging about 180 at home although difficult to assess from her on branded meter at the nursing home Diet is variable because of her variable appetite  However still requiring overall less insulin at the nursing home compared to when she was at home with poor control  For now we will need to increase her coverage for breakfast by at least 4 units and also decrease suppertime dose by 4 units as a precaution No change in NPH To continue checking blood sugars 4 times a day She is a candidate for the freestyle libre but doubt if the nursing home will be able to utilize this Given instruction to have her insulin given 30 minutes before eating   NEUROPATHY: She has better control with current regimen of analgesics and Neurontin  Patient Instructions  INSTRUCTIONS for insulin changes  INCREASE the Humulin R insulin in the morning to 14 units DECREASE the Humulin R insulin at dinnertime to 16 units  IMPORTANT: The Humulin R needs to be given before eating, preferably 30 minutes before the meal Do not hold insulin doses as per previous fax        Elayne Snare 03/08/2018, 1:48 PM      Note: This office note was prepared with Dragon voice recognition system technology. Any transcriptional errors that result from this process are unintentional.

## 2018-03-08 NOTE — Patient Instructions (Addendum)
INSTRUCTIONS for insulin changes  INCREASE the Humulin R insulin in the morning to 14 units DECREASE the Humulin R insulin at dinnertime to 16 units  IMPORTANT: The Humulin R needs to be given before eating, preferably 30 minutes before the meal Do not hold insulin doses as per previous fax

## 2018-04-01 ENCOUNTER — Other Ambulatory Visit: Payer: Self-pay | Admitting: *Deleted

## 2018-04-01 DIAGNOSIS — D509 Iron deficiency anemia, unspecified: Secondary | ICD-10-CM

## 2018-04-01 NOTE — Progress Notes (Signed)
cbc

## 2018-04-05 ENCOUNTER — Inpatient Hospital Stay: Payer: Medicare Other

## 2018-04-05 ENCOUNTER — Inpatient Hospital Stay: Payer: Medicare Other | Attending: Oncology

## 2018-04-05 DIAGNOSIS — R918 Other nonspecific abnormal finding of lung field: Secondary | ICD-10-CM | POA: Insufficient documentation

## 2018-04-05 DIAGNOSIS — E1165 Type 2 diabetes mellitus with hyperglycemia: Secondary | ICD-10-CM | POA: Diagnosis not present

## 2018-04-05 DIAGNOSIS — D509 Iron deficiency anemia, unspecified: Secondary | ICD-10-CM

## 2018-04-05 DIAGNOSIS — D62 Acute posthemorrhagic anemia: Secondary | ICD-10-CM | POA: Insufficient documentation

## 2018-04-05 DIAGNOSIS — Z853 Personal history of malignant neoplasm of breast: Secondary | ICD-10-CM | POA: Insufficient documentation

## 2018-04-05 DIAGNOSIS — E538 Deficiency of other specified B group vitamins: Secondary | ICD-10-CM | POA: Insufficient documentation

## 2018-04-05 LAB — CBC WITH DIFFERENTIAL/PLATELET
Abs Immature Granulocytes: 0.02 10*3/uL (ref 0.00–0.07)
BASOS ABS: 0.1 10*3/uL (ref 0.0–0.1)
BASOS PCT: 2 %
EOS ABS: 0.3 10*3/uL (ref 0.0–0.5)
EOS PCT: 4 %
HCT: 32.7 % — ABNORMAL LOW (ref 36.0–46.0)
HEMOGLOBIN: 9.7 g/dL — AB (ref 12.0–15.0)
Immature Granulocytes: 0 %
LYMPHS ABS: 0.9 10*3/uL (ref 0.7–4.0)
Lymphocytes Relative: 12 %
MCH: 28.4 pg (ref 26.0–34.0)
MCHC: 29.7 g/dL — AB (ref 30.0–36.0)
MCV: 95.9 fL (ref 80.0–100.0)
MONOS PCT: 7 %
Monocytes Absolute: 0.5 10*3/uL (ref 0.1–1.0)
NRBC: 0 % (ref 0.0–0.2)
Neutro Abs: 5.8 10*3/uL (ref 1.7–7.7)
Neutrophils Relative %: 75 %
Platelets: 346 10*3/uL (ref 150–400)
RBC: 3.41 MIL/uL — ABNORMAL LOW (ref 3.87–5.11)
RDW: 16.5 % — AB (ref 11.5–15.5)
WBC: 7.7 10*3/uL (ref 4.0–10.5)

## 2018-04-05 LAB — IRON AND TIBC
Iron: 41 ug/dL (ref 28–170)
SATURATION RATIOS: 12 % (ref 10.4–31.8)
TIBC: 355 ug/dL (ref 250–450)
UIBC: 314 ug/dL

## 2018-04-05 LAB — SAMPLE TO BLOOD BANK

## 2018-04-05 LAB — FERRITIN: Ferritin: 20 ng/mL (ref 11–307)

## 2018-04-05 MED ORDER — CYANOCOBALAMIN 1000 MCG/ML IJ SOLN
1000.0000 ug | Freq: Once | INTRAMUSCULAR | Status: AC
  Administered 2018-04-05: 1000 ug via INTRAMUSCULAR

## 2018-04-13 ENCOUNTER — Other Ambulatory Visit: Payer: Self-pay | Admitting: Oncology

## 2018-04-13 DIAGNOSIS — Z1231 Encounter for screening mammogram for malignant neoplasm of breast: Secondary | ICD-10-CM

## 2018-05-01 NOTE — Progress Notes (Signed)
[place]  [phone] Fax:([phone]) [phone]  ID: Andrea Wallace OB: [birth year]  MR#: [id number]  QTM#:[id number]  Patient Care Team: Kirk Ruths, MD as PCP - General (Unknown Physician Specialty)  CHIEF COMPLAINT: Anemia associated with acute blood loss. Left lower lobe lung mass, history of breast cancer.  INTERVAL HISTORY: Patient returns to clinic today for further evaluation and discussion of her laboratory results.  She complains of periodic episodes of extreme weakness and dizziness lasting approximately 1 minute but then resolving without intervention.  Her last episode was approximately 4 weeks ago.  She has persistent and chronic weakness and fatigue.  Patient also states her blood sugars are erratic. She has no neurologic complaints. She denies any chest pain, cough, hemoptysis, or shortness of breath. She has no neurologic complaints. She denies any recent fevers or illnesses. She has a good appetite and denies any weight loss. She has no nausea, vomiting, constipation, or diarrhea.  She has noted no melena or hematochezia.  She has no urinary complaints.  Patient offers no further specific complaints today.  REVIEW OF SYSTEMS:   Review of Systems  Constitutional: Positive for malaise/fatigue. Negative for fever and weight loss.  Respiratory: Negative.  Negative for cough, hemoptysis, sputum production and shortness of breath.   Cardiovascular: Negative.  Negative for chest pain and leg swelling.  Gastrointestinal: Negative.  Negative for abdominal pain, blood in stool and melena.  Genitourinary: Negative.  Negative for dysuria and hematuria.  Musculoskeletal: Negative.  Negative for back pain.  Skin: Negative.  Negative for rash.  Neurological: Positive for dizziness and weakness. Negative for sensory change, focal weakness and headaches.  Psychiatric/Behavioral: Negative.  The patient is not nervous/anxious and does not have insomnia.      As per HPI. Otherwise, a complete review of systems is negative.  PAST MEDICAL HISTORY: Past Medical History:  Diagnosis Date  . Allergy   . Anemia   . Arthritis   . Asthma    stress related per pt  . Blood transfusion   . Breast cancer [facility]) 2011   Right  . Cancer ([place])    right breast  . Complication of anesthesia    diff  waking up  . Cough   . Diabetes mellitus   . Family history of adverse reaction to anesthesia    " SISTER HAS DIFFICULTY WAKING "  . Generalized headaches   . History of kidney stones   . Hyperlipidemia   . Lung nodules   . Nasal congestion   . Neuromuscular disorder ([place])    neuropathy feet/legs/hands    PAST SURGICAL HISTORY: Past Surgical History:  Procedure Laterality Date  . BACK SURGERY    . BREAST SURGERY  06/2007   right mastectomy  . BREAST SURGERY  2011   left  . FOOT SURGERY    . KIDNEY STONE SURGERY    . LITHOTRIPSY    . MASTECTOMY Right 2011   with Radiaiton  . ORIF ELBOW FRACTURE Right 10/28/2016   Procedure: OPEN REDUCTION INTERNAL FIXATION (ORIF) ELBOW/OLECRANON FRACTURE;  Surgeon: Dorna Leitz, MD;  Location: [place];  Service: Orthopedics;  Laterality: Right;  . REDUCTION MAMMAPLASTY Left 2012  . SHOULDER ARTHROSCOPY     bilateral  . TOTAL KNEE ARTHROPLASTY    . VISCERAL ANGIOGRAPHY N/A 08/06/2016   Procedure: Visceral Angiography;  Surgeon: Algernon Huxley, MD;  Location: [place] [facility];  Service: Cardiovascular;  Laterality: N/A;  . VISCERAL ARTERY INTERVENTION N/A 08/06/2016  Procedure: Visceral Artery Intervention;  Surgeon: Algernon Huxley, MD;  Location: [place] [facility];  Service: Cardiovascular;  Laterality: N/A;    FAMILY HISTORY Family History  Problem Relation Age of Onset  . Osteoporosis Mother   . Heart disease Father   . Cancer Brother   . Breast cancer Brother   . Heart disease Sister        ADVANCED DIRECTIVES:    HEALTH MAINTENANCE: Social History   Tobacco Use  . Smoking status: Former  Smoker    Last attempt to quit: 03/15/1984    Years since quitting: 34.1  . Smokeless tobacco: Never Used  Substance Use Topics  . Alcohol use: Yes    Comment: occ  . Drug use: No     Colonoscopy:  PAP:  Bone density:  Lipid panel:  Allergies  Allergen Reactions  . Ciprofloxacin Diarrhea and Swelling  . Codeine Sulfate Itching  . Other     Other reaction(s): Other (See Comments) Mycins    Current Outpatient Medications  Medication Sig Dispense Refill  . acetaminophen (TYLENOL) 500 MG tablet Take 1,000 mg by mouth daily.     Marland Kitchen albuterol (PROVENTIL HFA;VENTOLIN HFA) 108 (90 Base) MCG/ACT inhaler Inhale into the lungs.    Marland Kitchen amLODipine (NORVASC) 5 MG tablet Take by mouth.    Marland Kitchen aspirin 81 MG tablet Take 81 mg by mouth at bedtime.     Marland Kitchen atorvastatin (LIPITOR) 80 MG tablet TAKE 1 TABLET BY MOUTH ONCE DAILY    . Continuous Blood Gluc Sensor (FREESTYLE LIBRE 14 DAY SENSOR) MISC 1 Units by Does not apply route every 14 (fourteen) days. 2 each 4  . diclofenac sodium (VOLTAREN) 1 % GEL Apply topically 4 (four) times daily.    . DULoxetine (CYMBALTA) 60 MG capsule TAKE ONE CAPSULE BY MOUTH ONCE DAILY 30 capsule 2  . Ferrous Sulfate (IRON) 325 (65 Fe) MG TABS Take 1 tablet (325 mg total) by mouth every other day. 30 each 1  . gabapentin (NEURONTIN) 600 MG tablet TAKE ONE TABLET BY MOUTH THREE TIMES DAILY 90 tablet 5  . glucose blood (FREESTYLE LITE) test strip Use to test blood sugar 3 times daily dx code. E11.9 100 each 5  . HYDROcodone-acetaminophen (NORCO) 10-325 MG tablet Take 1 tablet by mouth 2 (two) times daily as needed for moderate pain. 100 tablet 0  . insulin NPH Human (NOVOLIN N RELION) 100 UNIT/ML injection Inject 40 units before breakfast and 10 units at bedtime 20 mL 3  . insulin regular (NOVOLIN R RELION) 100 units/mL injection Use 10 units in the morning and 20 units at supper 20 mL 3  . midodrine (PROAMATINE) 5 MG tablet Take by mouth.    . montelukast (SINGULAIR) 10 MG  tablet Take 10 mg by mouth at bedtime.     . cholecalciferol (VITAMIN D) 1000 units tablet Take 1,000 Units by mouth daily.     No current facility-administered medications for this visit.     OBJECTIVE: Vitals:   05/02/18 1111  BP: (!) 152/87  Pulse: (!) 110  Temp: 98.6 F (37 C)     Body mass index is 26.09 kg/m.    ECOG FS:1 - Symptomatic but completely ambulatory  General: Well-developed, well-nourished, no acute distress. Eyes: Pink conjunctiva, anicteric sclera. HEENT: Normocephalic, moist mucous membranes. Lungs: Clear to auscultation bilaterally. Heart: Regular rate and rhythm. No rubs, murmurs, or gallops. Abdomen: Soft, nontender, nondistended. No organomegaly noted, normoactive bowel sounds. Musculoskeletal: No edema, cyanosis, or  clubbing. Neuro: Alert, answering all questions appropriately. Cranial nerves grossly intact. Skin: No rashes or petechiae noted. Psych: Normal affect.  LAB RESULTS:  Lab Results  Component Value Date   NA 140 02/03/2018   K 4.3 02/03/2018   CL 108 02/03/2018   CO2 25 02/03/2018   GLUCOSE 127 (H) 02/03/2018   BUN 35 (H) 02/03/2018   CREATININE 1.58 (H) 02/03/2018   CALCIUM 8.3 (L) 02/03/2018   PROT 7.0 08/16/2017   ALBUMIN 3.1 (L) 08/16/2017   AST 19 08/16/2017   ALT 15 08/16/2017   ALKPHOS 108 08/16/2017   BILITOT 0.2 08/16/2017   GFRNONAA 29 (L) 02/03/2018   GFRAA 34 (L) 02/03/2018    Lab Results  Component Value Date   WBC 8.0 05/02/2018   NEUTROABS 6.3 05/02/2018   HGB 10.3 (L) 05/02/2018   HCT 34.8 (L) 05/02/2018   MCV 97.8 05/02/2018   PLT 300 05/02/2018   Lab Results  Component Value Date   LABCA2 26.0 12/02/2015     STUDIES: No results found.  ASSESSMENT: Anemia associated with acute blood loss. Left lower lobe lung mass, history of breast cancer.  PLAN:    1. Anemia associated with acute blood loss: Patient's hemoglobin continues to trend up and is now 10.3.  Her iron stores continue to be within  normal limits.  Previously other than a decreased B12 level the remainder of her laboratory work is either negative or within normal limits.  No intervention is needed at this time.  Return to clinic in 2 months with repeat laboratory work and further evaluation.   2. Left lower lobe lung mass: CT results from February 15, 2017 reviewed independently with left lower lobe nodule unchanged from previous.  She was noted to have slowly progressive mediastinal and hilar adenopathy of indeterminate etiology.  It has been greater than 1 year since her last imaging, so therefore will repeat CT scan in the next 1 to 2 weeks.   3. History of breast cancer: Continue mammograms as ordered. [id number] is within normal limits.  4.  Weakness/dizziness: Likely related to erratic blood sugars.  Continue monitoring and treatment per primary care.  I spent a total of 30 minutes face-to-face with the patient of which greater than 50% of the visit was spent in counseling and coordination of care as detailed above.   Patient expressed understanding and was in agreement with this plan. She also understands that She can call clinic at any time with any questions, concerns, or complaints.    Lloyd Huger, MD   05/02/2018 1:33 PM

## 2018-05-02 ENCOUNTER — Inpatient Hospital Stay: Payer: Medicare Other | Attending: Oncology

## 2018-05-02 ENCOUNTER — Inpatient Hospital Stay (HOSPITAL_BASED_OUTPATIENT_CLINIC_OR_DEPARTMENT_OTHER): Payer: Medicare Other | Admitting: Oncology

## 2018-05-02 ENCOUNTER — Other Ambulatory Visit: Payer: Self-pay

## 2018-05-02 VITALS — BP 152/87 | HR 110 | Temp 98.6°F | Ht 65.0 in | Wt 156.8 lb

## 2018-05-02 DIAGNOSIS — D62 Acute posthemorrhagic anemia: Secondary | ICD-10-CM | POA: Diagnosis not present

## 2018-05-02 DIAGNOSIS — R42 Dizziness and giddiness: Secondary | ICD-10-CM

## 2018-05-02 DIAGNOSIS — Z853 Personal history of malignant neoplasm of breast: Secondary | ICD-10-CM | POA: Diagnosis not present

## 2018-05-02 DIAGNOSIS — R531 Weakness: Secondary | ICD-10-CM | POA: Insufficient documentation

## 2018-05-02 DIAGNOSIS — R918 Other nonspecific abnormal finding of lung field: Secondary | ICD-10-CM | POA: Insufficient documentation

## 2018-05-02 DIAGNOSIS — D509 Iron deficiency anemia, unspecified: Secondary | ICD-10-CM

## 2018-05-02 LAB — CBC WITH DIFFERENTIAL/PLATELET
Abs Immature Granulocytes: 0.03 10*3/uL (ref 0.00–0.07)
BASOS ABS: 0.1 10*3/uL (ref 0.0–0.1)
BASOS PCT: 1 %
EOS ABS: 0.3 10*3/uL (ref 0.0–0.5)
EOS PCT: 4 %
HEMATOCRIT: 34.8 % — AB (ref 36.0–46.0)
Hemoglobin: 10.3 g/dL — ABNORMAL LOW (ref 12.0–15.0)
Immature Granulocytes: 0 %
LYMPHS PCT: 10 %
Lymphs Abs: 0.8 10*3/uL (ref 0.7–4.0)
MCH: 28.9 pg (ref 26.0–34.0)
MCHC: 29.6 g/dL — ABNORMAL LOW (ref 30.0–36.0)
MCV: 97.8 fL (ref 80.0–100.0)
Monocytes Absolute: 0.5 10*3/uL (ref 0.1–1.0)
Monocytes Relative: 6 %
NRBC: 0 % (ref 0.0–0.2)
Neutro Abs: 6.3 10*3/uL (ref 1.7–7.7)
Neutrophils Relative %: 79 %
Platelets: 300 10*3/uL (ref 150–400)
RBC: 3.56 MIL/uL — AB (ref 3.87–5.11)
RDW: 14.6 % (ref 11.5–15.5)
WBC: 8 10*3/uL (ref 4.0–10.5)

## 2018-05-02 LAB — FERRITIN: FERRITIN: 23 ng/mL (ref 11–307)

## 2018-05-02 LAB — IRON AND TIBC
Iron: 210 ug/dL — ABNORMAL HIGH (ref 28–170)
SATURATION RATIOS: 67 % — AB (ref 10.4–31.8)
TIBC: 313 ug/dL (ref 250–450)
UIBC: 103 ug/dL

## 2018-05-02 LAB — SAMPLE TO BLOOD BANK

## 2018-05-02 NOTE — Progress Notes (Signed)
Patient is here to follow up on her anemia associated with acute blood loss. Patient stated that she had been having "the shakes" after she sits down for a long time and then to get up lasting for a minute for about a couple of months. Patient stays weak, tired and fatigued everyday. Patient stated that if she has nothing to do, she falls asleep. Patient also stated that she has had neck pain for several months.

## 2018-05-10 ENCOUNTER — Ambulatory Visit
Admission: RE | Admit: 2018-05-10 | Discharge: 2018-05-10 | Disposition: A | Discharge status: Home / Self Care | Payer: Medicare Other | Source: Ambulatory Visit | Attending: Oncology | Admitting: Oncology

## 2018-05-10 DIAGNOSIS — R918 Other nonspecific abnormal finding of lung field: Secondary | ICD-10-CM | POA: Insufficient documentation

## 2018-05-10 LAB — POCT I-STAT CREATININE: Creatinine, Ser: 1.4 mg/dL — ABNORMAL HIGH (ref 0.44–1.00)

## 2018-05-10 MED ORDER — IOPAMIDOL (ISOVUE-300) INJECTION 61%
50.0000 mL | Freq: Once | INTRAVENOUS | Status: AC | PRN
  Administered 2018-05-10: 60 mL via INTRAVENOUS

## 2018-05-12 ENCOUNTER — Telehealth: Payer: Self-pay | Admitting: Endocrinology

## 2018-05-12 NOTE — Telephone Encounter (Signed)
Blood sugar fax from nursing home shows low normal blood sugars between 12/5 and 12/8, lowest reading 35 on 12/5. No records available since 12/9 Please ask for updated blood sugar record from [facility]

## 2018-05-12 NOTE — Telephone Encounter (Signed)
[facility], and they will be refaxing blood sugar readings.

## 2018-05-15 ENCOUNTER — Telehealth: Payer: Self-pay | Admitting: Endocrinology

## 2018-05-15 NOTE — Telephone Encounter (Signed)
Confirm that she is taking 14 units regular insulin in the morning and 16 in the evening. New doses will be 18 units regular insulin in the morning and 12 in the evening before dinner

## 2018-05-16 NOTE — Telephone Encounter (Signed)
Called [facility] and spoke with Lattie Haw. She is going to fax the currently insulin orders for pt.

## 2018-05-16 NOTE — Telephone Encounter (Signed)
Fax with new RX sent to [phone].

## 2018-05-16 NOTE — Telephone Encounter (Signed)
Please have them change the doses to the new regimen anyway

## 2018-05-30 ENCOUNTER — Telehealth: Payer: Self-pay | Admitting: Endocrinology

## 2018-05-30 NOTE — Telephone Encounter (Signed)
Andrea Wallace from [facility] is calling in regard to parameters for blood sugars. She stated they faxed over some paperwork requesting this and did not receive anything back from our office    LISA [phone]

## 2018-05-30 NOTE — Telephone Encounter (Signed)
Called Andrea Wallace with [facility] and she stated that the pt is still having severe lows. She will be faxing at least the last 2 weeks of readings for Dr. Dwyane Dee to review and make changes again if needed.

## 2018-06-03 NOTE — Telephone Encounter (Signed)
We need to have blood sugar records

## 2018-06-06 ENCOUNTER — Telehealth: Payer: Self-pay | Admitting: Endocrinology

## 2018-06-06 NOTE — Telephone Encounter (Signed)
Form faxed today

## 2018-06-06 NOTE — Telephone Encounter (Signed)
Andrea Wallace with [facility] ph# [phone] called re: status of blood sugar parameters. Please call Andrea Wallace at the ph# listed above to advise.

## 2018-06-08 ENCOUNTER — Ambulatory Visit (INDEPENDENT_AMBULATORY_CARE_PROVIDER_SITE_OTHER): Payer: Medicare Other | Admitting: Endocrinology

## 2018-06-08 ENCOUNTER — Encounter: Payer: Self-pay | Admitting: Endocrinology

## 2018-06-08 VITALS — BP 110/60 | HR 124 | Ht 65.0 in | Wt 159.6 lb

## 2018-06-08 DIAGNOSIS — E1165 Type 2 diabetes mellitus with hyperglycemia: Secondary | ICD-10-CM | POA: Diagnosis not present

## 2018-06-08 DIAGNOSIS — Z794 Long term (current) use of insulin: Secondary | ICD-10-CM

## 2018-06-08 LAB — POCT GLYCOSYLATED HEMOGLOBIN (HGB A1C): Hemoglobin A1C: 6.1 % — AB (ref 4.0–5.6)

## 2018-06-08 NOTE — Patient Instructions (Signed)
Insulin doses to be changed as follows  Humulin REGULAR insulin before breakfast to be increased to 20 units Humulin regular insulin before dinner to be reduced to 10 units  Humulin N insulin at bedtime to be reduced to 6 units

## 2018-06-08 NOTE — Progress Notes (Signed)
Patient ID: Andrea Wallace, female   DOB: [birth year], 85 y.o.   MRN: [id number]   Reason for Appointment: Diabetes follow-up   History of Present Illness   Diagnosis: Type 2 DIABETES MELITUS, date of diagnosis 1999  Previous history: She has been on insulin for several years to control her diabetes and usually requires large doses Has been on basal bolus regimen for the last few years, taking Lantus twice a day.  Her blood sugars tend to fluctuate significantly but her A1c is usually at a reasonable level She does not clearly benefit from GLP-1 drugs and had been taking Byetta to help with postprandial hyperglycemia, this has been stopped  She had been tried on Victoza, Byetta and Invokana previously and these were  stopped because of unclear benefit       RECENT history:  Insulin regimen:    Novolin N 40 in a.m, 10 at bedtime; REGULAR insulin 18 at breakfast, 12 BEFORE SUPPER   Oral hypoglycemic drugs: None  Her A1c is again lower than expected at 2.1, previously 5.4 She does have chronic anemia  Current management, blood sugar patterns and problems identified:  She was reportedly having low blood sugars last month at the nursing home but no records were faxed over and continues to be on the same dose of insulin  The patient thinks that her blood sugar may be low at different times  However she does appear to be getting her blood sugar still monitored 4 times a day  The patient thinks that the timing of her insulin has been inconsistent at the nursing home because of shortage of technicians  No recent blood sugars show HIGHEST blood sugars at lunchtime again despite no change in her diet obtained blood sugars are now being checked 4 times a day by the nursing home as requested  However patient says that her insulin doses are not being given on time and at times after eating  As usual her blood sugars fluctuate  FASTING blood sugars have been mostly fairly good  with only 2 relatively low reading of 57 9 61 this month, and has only mild symptoms when her blood sugars are low  Blood sugars at dinnertime and at 8 PM are usually fairly good with only 1 recorded low blood sugar of 42 at 8 PM  She is having somewhat variable intake as before since she does not like the food at the nursing home  Monitors blood glucose:  4 times a day on average.    Glucometer:  Unknown         Blood sugar readings by review of nursing home records:   PRE-MEAL Fasting Lunch Dinner  8 PM Overall  Glucose range: 61-217  133-319 72-208 70-164   Mean/median:        Previous readings:  PRE-MEAL Fasting Lunch Dinner Bedtime Overall  Glucose range: 75-164 219-398  53-251    Mean/median:        POST-MEAL PC Breakfast PC Lunch PC Dinner  Glucose range:    93-217  Mean/median:           Side effects from medications: None  Proper timing of medications in relation to meals: Yes.          Meals: 3 meals per day.  Usually avoiding  drinks with sugar including juices          Physical activity: exercise: none, has back and leg pain, dyspnea  Dietician visit: Most recent: Years ago       Complications: are: Neuropathy, microalbuminuria     Wt Readings from Last 3 Encounters:  06/08/18 159 lb 9.6 oz (72.4 kg)  05/02/18 156 lb 12.8 oz (71.1 kg)  03/08/18 153 lb (69.4 kg)     Lab Results  Component Value Date   HGBA1C 6.1 (A) 06/08/2018   HGBA1C 5.4 03/08/2018   HGBA1C 6.4 (A) 12/01/2017   Lab Results  Component Value Date   MICROALBUR 94.4 (H) 03/30/2017   [place] 90 10/12/2017   CREATININE 1.40 (H) 05/10/2018    OTHER problems discussed today: See review of systems   Allergies as of 06/08/2018      Reactions   Ciprofloxacin Diarrhea, Swelling   Codeine Sulfate Itching   Other    Other reaction(s): Other (See Comments) Mycins      Medication List       Accurate as of June 08, 2018  3:13 PM. Always use your most recent med list.         acetaminophen 500 MG tablet Commonly known as:  TYLENOL Take 1,000 mg by mouth daily.   albuterol 108 (90 Base) MCG/ACT inhaler Commonly known as:  PROVENTIL HFA;VENTOLIN HFA Inhale 2 puffs into the lungs every 4 (four) hours as needed for wheezing or shortness of breath.   aspirin 81 MG tablet Take 81 mg by mouth at bedtime.   atorvastatin 80 MG tablet Commonly known as:  LIPITOR TAKE 1 TABLET BY MOUTH ONCE DAILY   cholecalciferol 1000 units tablet Commonly known as:  VITAMIN D Take 1,000 Units by mouth daily.   dextromethorphan-guaiFENesin 30-600 MG 12hr tablet Commonly known as:  MUCINEX DM Take 1 tablet by mouth daily. TAKE 1 TABLET BY MOUTH ONCE DAILY FOR COUGHING.   DULoxetine 60 MG capsule Commonly known as:  CYMBALTA TAKE ONE CAPSULE BY MOUTH ONCE DAILY   gabapentin 600 MG tablet Commonly known as:  NEURONTIN TAKE ONE TABLET BY MOUTH THREE TIMES DAILY   HYDROcodone-acetaminophen 10-325 MG tablet Commonly known as:  NORCO Take 1 tablet by mouth 2 (two) times daily as needed for moderate pain.   insulin NPH Human 100 UNIT/ML injection Commonly known as:  NOVOLIN N RELION Inject 40 units before breakfast and 10 units at bedtime   insulin regular 100 units/mL injection Commonly known as:  NOVOLIN R RELION Use 10 units in the morning and 20 units at supper   Iron 325 (65 Fe) MG Tabs Take 1 tablet (325 mg total) by mouth every other day.   midodrine 5 MG tablet Commonly known as:  PROAMATINE Take 5 mg by mouth 2 (two) times daily with a meal. TAKE 1 TABLET BY MOUTH 2 TIMES DAILY.   montelukast 10 MG tablet Commonly known as:  SINGULAIR Take 10 mg by mouth at bedtime.       Allergies:  Allergies  Allergen Reactions  . Ciprofloxacin Diarrhea and Swelling  . Codeine Sulfate Itching  . Other     Other reaction(s): Other (See Comments) Mycins    Past Medical History:  Diagnosis Date  . Allergy   . Anemia   . Arthritis   . Asthma    stress  related per pt  . Blood transfusion   . Breast cancer [facility]) 2011   Right  . Cancer ([place])    right breast  . Complication of anesthesia    diff  waking up  . Cough   . Diabetes mellitus   .  Family history of adverse reaction to anesthesia    " SISTER HAS DIFFICULTY WAKING "  . Generalized headaches   . History of kidney stones   . Hyperlipidemia   . Lung nodules   . Nasal congestion   . Neuromuscular disorder ([place])    neuropathy feet/legs/hands    Past Surgical History:  Procedure Laterality Date  . BACK SURGERY    . BREAST SURGERY  06/2007   right mastectomy  . BREAST SURGERY  2011   left  . FOOT SURGERY    . KIDNEY STONE SURGERY    . LITHOTRIPSY    . MASTECTOMY Right 2011   with Radiaiton  . ORIF ELBOW FRACTURE Right 10/28/2016   Procedure: OPEN REDUCTION INTERNAL FIXATION (ORIF) ELBOW/OLECRANON FRACTURE;  Surgeon: Dorna Leitz, MD;  Location: [place];  Service: Orthopedics;  Laterality: Right;  . REDUCTION MAMMAPLASTY Left 2012  . SHOULDER ARTHROSCOPY     bilateral  . TOTAL KNEE ARTHROPLASTY    . VISCERAL ANGIOGRAPHY N/A 08/06/2016   Procedure: Visceral Angiography;  Surgeon: Algernon Huxley, MD;  Location: [place] CV LAB;  Service: Cardiovascular;  Laterality: N/A;  . VISCERAL ARTERY INTERVENTION N/A 08/06/2016   Procedure: Visceral Artery Intervention;  Surgeon: Algernon Huxley, MD;  Location: [place] CV LAB;  Service: Cardiovascular;  Laterality: N/A;    Family History  Problem Relation Age of Onset  . Osteoporosis Mother   . Heart disease Father   . Cancer Brother   . Breast cancer Brother   . Heart disease Sister     Social History:  reports that she quit smoking about 34 years ago. She has never used smokeless tobacco. She reports current alcohol use. She reports that she does not use drugs.  Review of Systems:   Chronic kidney disease: Creatinine has been  upper normal, stable, no hypotension    Lab Results  Component Value Date   CREATININE 1.40  (H) 05/10/2018    HYPERLIPIDEMIA: The lipid abnormality consists of elevated  triglycerides, has been treated with TriCor Also followed by PCP Last triglycerides from PCP: 254     Lab Results  Component Value Date   CHOL 166 10/12/2017   HDL 40.00 10/12/2017   LDLCALC 90 10/12/2017   LDLDIRECT 116.0 03/30/2017   TRIG 180.0 (H) 10/12/2017   CHOLHDL 4 10/12/2017     She has chronic pains and paresthesia in her feet and lower legs Also has had Chronic back pain  She is taking Cymbalta  Also on gabapentin 600 mg   Refill on hydrocodone was given in 3/19, now she is in a nursing home She says that she mostly takes this at bedtime and occasionally when she is going out and being active during the day  Last diabetic foot exam was in 8/17 during some sensory loss  She has had a multinodular goiter, this has been euthyroid, Last TSH normal in 2/18 from PCP  Lab Results  Component Value Date   TSH 2.79 08/16/2017        Examination:   BP 110/60 (BP Location: Left Arm, Patient Position: Sitting, Cuff Size: Normal)   Pulse (!) 124   Ht 5\' 5"  (1.651 m)   Wt 159 lb 9.6 oz (72.4 kg)   SpO2 92%   BMI 26.56 kg/m   Body mass index is 26.56 kg/m.     ASSESSMENT/ PLAN:   Diabetes type 2 on insulin:  See history of present illness for detailed discussion of  current  management, blood sugar patterns and problems identified  Her blood sugars are overall somewhat better with her being in a nursing home in a controlled environment She is however having fluctuating blood sugars about the same despite increasing her insulin in the morning and reducing the bedtime dose She usually prefers to have Generic or inexpensive medications and although she may do better with U-500 insulin she probably cannot afford this  She has unusually high readings around suppertime, some of these may be right after eating also This is despite her being fairly compliant with the morning injection and  increasing her NPH by 10 units on the last visit Her diet has been fairly good and she is not getting excessive carbohydrate or drinks with sugar   Recommendations:  Will need to have her take insulin doses consistently  If she is eating away from home including at the lunch meal she will need to take regular insulin before eating consistently  Given written instructions for insulin  She will need to keep a record of her insulin doses that she is taking and bring this to her next visit  Discussed timing of checking blood sugars  She will need to reduce her bedtime dose of NPH down to 15 units since she has a tendency to overnight hypoglycemia  To check blood sugars more consistently at different times per day, up to 4 times a day  She will be given a prescription for the [facility] sensor and also discussed Medicare guidelines for use  Microalbuminuria: Will recheck today    Patient Instructions  Insulin doses to be changed as follows  Humulin REGULAR insulin before breakfast to be increased to 20 units Humulin regular insulin before dinner to be reduced to 10 units  Humulin N insulin at bedtime to be reduced to 6 units        Elayne Snare 06/08/2018, 3:13 PM      Note: This office note was prepared with Dragon voice recognition system technology. Any transcriptional errors that result from this process are unintentional.             Patient ID: Andrea Wallace, female   DOB: 1931-12-26, 85 y.o.   MRN: [id number]   Reason for Appointment: Diabetes follow-up   History of Present Illness   Diagnosis: Type 2 DIABETES MELITUS, date of diagnosis 1999  Previous history: She has been on insulin for several years to control her diabetes and usually requires large doses Has been on basal bolus regimen for the last few years, taking Lantus twice a day.  Her blood sugars tend to fluctuate significantly but her A1c is usually at a reasonable level She does not clearly  benefit from GLP-1 drugs and had been taking Byetta to help with postprandial hyperglycemia, this has been stopped  She had been tried on Victoza, Byetta and Invokana previously and these were  stopped because of unclear benefit       RECENT history:  Insulin regimen:    Novolin N 50 in a.m, 25 at bedtime REGULAR insulin 20 at breakfast, 30  BEFORE SUPPER    Her A1c has been 10.2 recently, generally around 7-8%  Current management, blood sugar patterns and problems identified:  She appears to have the same pattern of blood sugars before despite adjusting her insulin  Difficult to know why she has fluctuation in her blood sugars especially in the mornings even though she thinks she is trying to take her insulin regularly most of the time  She was given a diary to keep her blood sugar records and her insulin doses but not clear if she is doing this and has not brought this today  More recently her FASTING blood sugars have been closer to normal and not as consistently high; however it was 31 about 3 days ago and 321 the next day in the morning  Blood sugars are fairly consistently high in the evenings with only a couple of readings below 200  Difficult to know whether she is checking her sugar before or after eating in the evening but appears to be checking it either right before, during our soon after her meals around 6-8 PM  No readings at bedtime  HYPOGLYCEMIA has been documented only once  Again blood sugars are higher than expected at suppertime despite her not eating much at lunchtime  Her diet has been about the same as also her weight  She will has been on metformin before and appears to be not taking it now  Monitors blood glucose:  2-3 times a day on average.    Glucometer: Freestyle          Blood sugar readings by monitor download, using FreeStyle meter  Mean values apply above for all meters except median for One Touch  PRE-MEAL Fasting Lunch Dinner Bedtime  Overall  Glucose range: 31-366 ?  256  129-424    Mean/median: 200   320   238    Oral hypoglycemic drugs: Metformin ER, 500 mg 2 a day       Side effects from medications: None  Proper timing of medications in relation to meals: Yes.          Meals: 3 meals per day.   at breakfast she is eating a Half bagel.  Mostly eating cheese crackers or yogurt at lunch and full meal at dinner Usually avoiding all drinks with sugar including juices          Physical activity: exercise: none, has back pain            Dietician visit: Most recent: Years ago       Complications: are: Neuropathy, microalbuminuria     Wt Readings from Last 3 Encounters:  06/08/18 159 lb 9.6 oz (72.4 kg)  05/02/18 156 lb 12.8 oz (71.1 kg)  03/08/18 153 lb (69.4 kg)     Lab Results  Component Value Date   HGBA1C 6.1 (A) 06/08/2018   HGBA1C 5.4 03/08/2018   HGBA1C 6.4 (A) 12/01/2017   Lab Results  Component Value Date   MICROALBUR 94.4 (H) 03/30/2017   LDLCALC 90 10/12/2017   CREATININE 1.40 (H) 05/10/2018    OTHER problems discussed today: See review of systems   Allergies as of 06/08/2018      Reactions   Ciprofloxacin Diarrhea, Swelling   Codeine Sulfate Itching   Other    Other reaction(s): Other (See Comments) Mycins      Medication List       Accurate as of June 08, 2018  3:13 PM. Always use your most recent med list.        acetaminophen 500 MG tablet Commonly known as:  TYLENOL Take 1,000 mg by mouth daily.   albuterol 108 (90 Base) MCG/ACT inhaler Commonly known as:  PROVENTIL HFA;VENTOLIN HFA Inhale 2 puffs into the lungs every 4 (four) hours as needed for wheezing or shortness of breath.   aspirin 81 MG tablet Take 81 mg by mouth at bedtime.   atorvastatin 80  MG tablet Commonly known as:  LIPITOR TAKE 1 TABLET BY MOUTH ONCE DAILY   cholecalciferol 1000 units tablet Commonly known as:  VITAMIN D Take 1,000 Units by mouth daily.   dextromethorphan-guaiFENesin 30-600  MG 12hr tablet Commonly known as:  MUCINEX DM Take 1 tablet by mouth daily. TAKE 1 TABLET BY MOUTH ONCE DAILY FOR COUGHING.   DULoxetine 60 MG capsule Commonly known as:  CYMBALTA TAKE ONE CAPSULE BY MOUTH ONCE DAILY   gabapentin 600 MG tablet Commonly known as:  NEURONTIN TAKE ONE TABLET BY MOUTH THREE TIMES DAILY   HYDROcodone-acetaminophen 10-325 MG tablet Commonly known as:  NORCO Take 1 tablet by mouth 2 (two) times daily as needed for moderate pain.   insulin NPH Human 100 UNIT/ML injection Commonly known as:  NOVOLIN N RELION Inject 40 units before breakfast and 10 units at bedtime   insulin regular 100 units/mL injection Commonly known as:  NOVOLIN R RELION Use 10 units in the morning and 20 units at supper   Iron 325 (65 Fe) MG Tabs Take 1 tablet (325 mg total) by mouth every other day.   midodrine 5 MG tablet Commonly known as:  PROAMATINE Take 5 mg by mouth 2 (two) times daily with a meal. TAKE 1 TABLET BY MOUTH 2 TIMES DAILY.   montelukast 10 MG tablet Commonly known as:  SINGULAIR Take 10 mg by mouth at bedtime.       Allergies:  Allergies  Allergen Reactions  . Ciprofloxacin Diarrhea and Swelling  . Codeine Sulfate Itching  . Other     Other reaction(s): Other (See Comments) Mycins    Past Medical History:  Diagnosis Date  . Allergy   . Anemia   . Arthritis   . Asthma    stress related per pt  . Blood transfusion   . Breast cancer [facility]) 2011   Right  . Cancer ([place])    right breast  . Complication of anesthesia    diff  waking up  . Cough   . Diabetes mellitus   . Family history of adverse reaction to anesthesia    " SISTER HAS DIFFICULTY WAKING "  . Generalized headaches   . History of kidney stones   . Hyperlipidemia   . Lung nodules   . Nasal congestion   . Neuromuscular disorder ([place])    neuropathy feet/legs/hands    Past Surgical History:  Procedure Laterality Date  . BACK SURGERY    . BREAST SURGERY  06/2007   right  mastectomy  . BREAST SURGERY  2011   left  . FOOT SURGERY    . KIDNEY STONE SURGERY    . LITHOTRIPSY    . MASTECTOMY Right 2011   with Radiaiton  . ORIF ELBOW FRACTURE Right 10/28/2016   Procedure: OPEN REDUCTION INTERNAL FIXATION (ORIF) ELBOW/OLECRANON FRACTURE;  Surgeon: Dorna Leitz, MD;  Location: [place];  Service: Orthopedics;  Laterality: Right;  . REDUCTION MAMMAPLASTY Left 2012  . SHOULDER ARTHROSCOPY     bilateral  . TOTAL KNEE ARTHROPLASTY    . VISCERAL ANGIOGRAPHY N/A 08/06/2016   Procedure: Visceral Angiography;  Surgeon: Algernon Huxley, MD;  Location: [place] CV LAB;  Service: Cardiovascular;  Laterality: N/A;  . VISCERAL ARTERY INTERVENTION N/A 08/06/2016   Procedure: Visceral Artery Intervention;  Surgeon: Algernon Huxley, MD;  Location: [place] CV LAB;  Service: Cardiovascular;  Laterality: N/A;    Family History  Problem Relation Age of Onset  . Osteoporosis Mother   .  Heart disease Father   . Cancer Brother   . Breast cancer Brother   . Heart disease Sister     Social History:  reports that she quit smoking about 34 years ago. She has never used smokeless tobacco. She reports current alcohol use. She reports that she does not use drugs.  Review of Systems:   Chronic kidney disease: Creatinine has mildly increased Followed by PCP Last urine microalbumin was high    Lab Results  Component Value Date   CREATININE 1.40 (H) 05/10/2018     HYPERLIPIDEMIA: The lipid abnormality consists of elevated  triglycerides, has been treated with TriCor Also followed by PCP Last LDL below 100   Lab Results  Component Value Date   CHOL 166 10/12/2017   HDL 40.00 10/12/2017   LDLCALC 90 10/12/2017   LDLDIRECT 116.0 03/30/2017   TRIG 180.0 (H) 10/12/2017   CHOLHDL 4 10/12/2017     She has chronic pains and paresthesia in her feet and lower legs Along with chronic back pain  She is taking her Cymbalta along with gabapentin 600 mg 3 times daily  She is  having dyspnea and she thinks her symptoms are not being controlled with current management  Last diabetic foot exam was in 7/19 showing sensory loss  She has had a multinodular goiter, this has been euthyroid, Last TSH normal  Lab Results  Component Value Date   TSH 2.79 08/16/2017        Examination:   BP 110/60 (BP Location: Left Arm, Patient Position: Sitting, Cuff Size: Normal)   Pulse (!) 124   Ht 5\' 5"  (1.651 m)   Wt 159 lb 9.6 oz (72.4 kg)   SpO2 92%   BMI 26.56 kg/m   Body mass index is 26.56 kg/m.      ASSESSMENT/ PLAN:   Diabetes type 2 on insulin:  See history of present illness for detailed discussion of  current management, blood sugar patterns and problems identified  Her A1c is still good at 6.1  A1c result may be affected by her chronic anemia However blood sugars are generally fairly good at nursing home She has a tendency to occasional low normal or low sugars either at 8 PM or fasting However blood sugars are mostly high around lunchtime Although she thinks she may not be getting her insulin currently on schedule not clear if this is able to explain her variable blood sugars  Considering her age and multiple medical problems her control is excellent even with NPH and regular regimen which she wants to continue because of low cost  Written instructions for insulin changes given: She will increase regular insulin in the morning to 20 and reduce the regular at dinnertime to 10 units NPH will be reduced to 6 units at bedtime to avoid overnight hypoglycemia Follow-up in 3 months    NEUROPATHY: Controlled  Dyspnea: She will need to follow-up with her PCP  Patient Instructions  Insulin doses to be changed as follows  Humulin REGULAR insulin before breakfast to be increased to 20 units Humulin regular insulin before dinner to be reduced to 10 units  Humulin N insulin at bedtime to be reduced to 6 units        Elayne Snare 06/08/2018, 3:13 PM        Note: This office note was prepared with Dragon voice recognition system technology. Any transcriptional errors that result from this process are unintentional.

## 2018-07-01 NOTE — Progress Notes (Signed)
[place]  [phone] [phone]  ID: Andrea Wallace OB: March 26, 1932  MR#: [id number]  CBS#:[id number]  Patient Care Team: Kirk Ruths, MD as PCP - General (Unknown Physician Specialty)  CHIEF COMPLAINT: Anemia associated with acute blood loss. Left lower lobe lung mass, history of breast cancer.  INTERVAL HISTORY: Patient returns to clinic today for repeat laboratory work and further evaluation.  She continues to have chronic weakness and fatigue, but otherwise feels well.  She has no neurologic complaints. She denies any chest pain, cough, hemoptysis, or shortness of breath.  She denies any recent fevers or illnesses. She has a good appetite and denies any weight loss. She has no nausea, vomiting, constipation, or diarrhea.  She has noted no melena or hematochezia.  She has no urinary complaints.  Patient offers no further specific complaints today.  REVIEW OF SYSTEMS:   Review of Systems  Constitutional: Positive for malaise/fatigue. Negative for fever and weight loss.  Respiratory: Negative.  Negative for cough, hemoptysis, sputum production and shortness of breath.   Cardiovascular: Negative.  Negative for chest pain and leg swelling.  Gastrointestinal: Negative.  Negative for abdominal pain, blood in stool and melena.  Genitourinary: Negative.  Negative for dysuria and hematuria.  Musculoskeletal: Negative.  Negative for back pain.  Skin: Negative.  Negative for rash.  Neurological: Positive for weakness. Negative for dizziness, sensory change, focal weakness and headaches.  Psychiatric/Behavioral: Negative.  The patient is not nervous/anxious and does not have insomnia.     As per HPI. Otherwise, a complete review of systems is negative.  PAST MEDICAL HISTORY: Past Medical History:  Diagnosis Date  . Allergy   . Anemia   . Arthritis   . Asthma    stress related per pt  . Blood transfusion   . Breast cancer [facility]) 2011   Right  . Cancer ([place])    right breast  . Complication of anesthesia    diff  waking up  . Cough   . Diabetes mellitus   . Family history of adverse reaction to anesthesia    " SISTER HAS DIFFICULTY WAKING "  . Generalized headaches   . History of kidney stones   . Hyperlipidemia   . Lung nodules   . Nasal congestion   . Neuromuscular disorder ([place])    neuropathy feet/legs/hands    PAST SURGICAL HISTORY: Past Surgical History:  Procedure Laterality Date  . BACK SURGERY    . BREAST SURGERY  06/2007   right mastectomy  . BREAST SURGERY  2011   left  . FOOT SURGERY    . KIDNEY STONE SURGERY    . LITHOTRIPSY    . MASTECTOMY Right 2011   with Radiaiton  . ORIF ELBOW FRACTURE Right 10/28/2016   Procedure: OPEN REDUCTION INTERNAL FIXATION (ORIF) ELBOW/OLECRANON FRACTURE;  Surgeon: Dorna Leitz, MD;  Location: [place];  Service: Orthopedics;  Laterality: Right;  . REDUCTION MAMMAPLASTY Left 2012  . SHOULDER ARTHROSCOPY     bilateral  . TOTAL KNEE ARTHROPLASTY    . VISCERAL ANGIOGRAPHY N/A 08/06/2016   Procedure: Visceral Angiography;  Surgeon: Algernon Huxley, MD;  Location: [place] [facility];  Service: Cardiovascular;  Laterality: N/A;  . VISCERAL ARTERY INTERVENTION N/A 08/06/2016   Procedure: Visceral Artery Intervention;  Surgeon: Algernon Huxley, MD;  Location: [place] [facility];  Service: Cardiovascular;  Laterality: N/A;    FAMILY HISTORY Family History  Problem Relation Age of Onset  . Osteoporosis Mother   .  Heart disease Father   . Cancer Brother   . Breast cancer Brother   . Heart disease Sister        ADVANCED DIRECTIVES:    HEALTH MAINTENANCE: Social History   Tobacco Use  . Smoking status: Former Smoker    Last attempt to quit: 03/15/1984    Years since quitting: 34.3  . Smokeless tobacco: Never Used  Substance Use Topics  . Alcohol use: Yes    Comment: occ  . Drug use: No     Colonoscopy:  PAP:  Bone density:  Lipid panel:  Allergies  Allergen  Reactions  . Ciprofloxacin Diarrhea and Swelling  . Codeine Sulfate Itching  . Other     Other reaction(s): Other (See Comments) Mycins    Current Outpatient Medications  Medication Sig Dispense Refill  . acetaminophen (TYLENOL) 500 MG tablet Take 1,000 mg by mouth daily.     Marland Kitchen albuterol (PROVENTIL HFA;VENTOLIN HFA) 108 (90 Base) MCG/ACT inhaler Inhale 2 puffs into the lungs every 4 (four) hours as needed for wheezing or shortness of breath.    Marland Kitchen aspirin 81 MG tablet Take 81 mg by mouth at bedtime.     Marland Kitchen atorvastatin (LIPITOR) 80 MG tablet TAKE 1 TABLET BY MOUTH ONCE DAILY    . cholecalciferol (VITAMIN D) 1000 units tablet Take 1,000 Units by mouth daily.    Marland Kitchen dextromethorphan-guaiFENesin (MUCINEX DM) 30-600 MG 12hr tablet Take 1 tablet by mouth daily. TAKE 1 TABLET BY MOUTH ONCE DAILY FOR COUGHING.    . DULoxetine (CYMBALTA) 60 MG capsule TAKE ONE CAPSULE BY MOUTH ONCE DAILY 30 capsule 2  . Ferrous Sulfate (IRON) 325 (65 Fe) MG TABS Take 1 tablet (325 mg total) by mouth every other day. 30 each 1  . gabapentin (NEURONTIN) 600 MG tablet TAKE ONE TABLET BY MOUTH THREE TIMES DAILY 90 tablet 5  . HYDROcodone-acetaminophen (NORCO) 10-325 MG tablet Take 1 tablet by mouth 2 (two) times daily as needed for moderate pain. 100 tablet 0  . insulin NPH Human (NOVOLIN N RELION) 100 UNIT/ML injection Inject 40 units before breakfast and 10 units at bedtime 20 mL 3  . insulin regular (NOVOLIN R RELION) 100 units/mL injection Use 10 units in the morning and 20 units at supper (Patient taking differently: Use 18 units in the morning and 12 units at supper) 20 mL 3  . midodrine (PROAMATINE) 5 MG tablet Take 5 mg by mouth 2 (two) times daily with a meal. TAKE 1 TABLET BY MOUTH 2 TIMES DAILY.    . montelukast (SINGULAIR) 10 MG tablet Take 10 mg by mouth at bedtime.      No current facility-administered medications for this visit.     OBJECTIVE: Vitals:   07/04/18 1326  BP: 126/84  Pulse: (!) 120  Resp:  (!) 22  Temp: (!) 97.3 F (36.3 C)     Body mass index is 26.63 kg/m.    ECOG FS:1 - Symptomatic but completely ambulatory  General: Well-developed, well-nourished, no acute distress. Eyes: Pink conjunctiva, anicteric sclera. HEENT: Normocephalic, moist mucous membranes. Lungs: Clear to auscultation bilaterally. Heart: Regular rate and rhythm. No rubs, murmurs, or gallops. Abdomen: Soft, nontender, nondistended. No organomegaly noted, normoactive bowel sounds. Musculoskeletal: No edema, cyanosis, or clubbing. Neuro: Alert, answering all questions appropriately. Cranial nerves grossly intact. Skin: No rashes or petechiae noted. Psych: Normal affect.  LAB RESULTS:  Lab Results  Component Value Date   NA 140 02/03/2018   K 4.3 02/03/2018   CL  108 02/03/2018   CO2 25 02/03/2018   GLUCOSE 127 (H) 02/03/2018   BUN 35 (H) 02/03/2018   CREATININE 1.40 (H) 05/10/2018   CALCIUM 8.3 (L) 02/03/2018   PROT 7.0 08/16/2017   ALBUMIN 3.1 (L) 08/16/2017   AST 19 08/16/2017   ALT 15 08/16/2017   ALKPHOS 108 08/16/2017   BILITOT 0.2 08/16/2017   GFRNONAA 29 (L) 02/03/2018   GFRAA 34 (L) 02/03/2018    Lab Results  Component Value Date   WBC 7.7 07/04/2018   NEUTROABS 5.7 07/04/2018   HGB 12.5 07/04/2018   HCT 42.3 07/04/2018   MCV 93.4 07/04/2018   PLT 287 07/04/2018   Lab Results  Component Value Date   IRON 37 07/04/2018   TIBC 308 07/04/2018   IRONPCTSAT 12 07/04/2018   Lab Results  Component Value Date   FERRITIN 33 07/04/2018     STUDIES: No results found.  ASSESSMENT: Anemia associated with acute blood loss. Left lower lobe lung mass, history of breast cancer.  PLAN:    1. Anemia associated with acute blood loss: Patient's hemoglobin and iron stores are now within normal limits.  Previously other than a decreased B12 level, the remainder of her laboratory work is either negative or within normal limits.  Patient last received IV Feraheme on June 29, 2017.  And  last received B12 injection on April 05, 2018.  No intervention is needed.  Return to clinic in 3 months for routine evaluation.   2. Left lower lobe lung nodule: CT scan results from May 10, 2018 reviewed independently with 7 mm nodule improved from prior, and likely benign.  Patient also noted to have thoracic lymphadenopathy that is grossly unchanged.  No intervention is needed, consider reimaging in 1 year.  3. History of breast cancer: Patient's last mammogram was in September 2018.   Patient expressed understanding and was in agreement with this plan. She also understands that She can call clinic at any time with any questions, concerns, or complaints.    Lloyd Huger, MD   07/05/2018 6:23 AM

## 2018-07-04 ENCOUNTER — Inpatient Hospital Stay (HOSPITAL_BASED_OUTPATIENT_CLINIC_OR_DEPARTMENT_OTHER): Payer: Medicare Other | Admitting: Oncology

## 2018-07-04 ENCOUNTER — Inpatient Hospital Stay: Payer: Medicare Other | Attending: Oncology

## 2018-07-04 ENCOUNTER — Inpatient Hospital Stay: Payer: Medicare Other

## 2018-07-04 VITALS — BP 126/84 | HR 120 | Temp 97.3°F | Resp 22 | Wt 160.0 lb

## 2018-07-04 DIAGNOSIS — R911 Solitary pulmonary nodule: Secondary | ICD-10-CM | POA: Insufficient documentation

## 2018-07-04 DIAGNOSIS — D509 Iron deficiency anemia, unspecified: Secondary | ICD-10-CM

## 2018-07-04 DIAGNOSIS — D62 Acute posthemorrhagic anemia: Secondary | ICD-10-CM | POA: Insufficient documentation

## 2018-07-04 DIAGNOSIS — Z853 Personal history of malignant neoplasm of breast: Secondary | ICD-10-CM

## 2018-07-04 DIAGNOSIS — R918 Other nonspecific abnormal finding of lung field: Secondary | ICD-10-CM

## 2018-07-04 LAB — CBC WITH DIFFERENTIAL/PLATELET
Abs Immature Granulocytes: 0.06 10*3/uL (ref 0.00–0.07)
BASOS ABS: 0.1 10*3/uL (ref 0.0–0.1)
Basophils Relative: 1 %
Eosinophils Absolute: 0.3 10*3/uL (ref 0.0–0.5)
Eosinophils Relative: 4 %
HCT: 42.3 % (ref 36.0–46.0)
Hemoglobin: 12.5 g/dL (ref 12.0–15.0)
Immature Granulocytes: 1 %
Lymphocytes Relative: 11 %
Lymphs Abs: 0.8 10*3/uL (ref 0.7–4.0)
MCH: 27.6 pg (ref 26.0–34.0)
MCHC: 29.6 g/dL — ABNORMAL LOW (ref 30.0–36.0)
MCV: 93.4 fL (ref 80.0–100.0)
Monocytes Absolute: 0.7 10*3/uL (ref 0.1–1.0)
Monocytes Relative: 9 %
NEUTROS ABS: 5.7 10*3/uL (ref 1.7–7.7)
NEUTROS PCT: 74 %
Platelets: 287 10*3/uL (ref 150–400)
RBC: 4.53 MIL/uL (ref 3.87–5.11)
RDW: 15.9 % — ABNORMAL HIGH (ref 11.5–15.5)
WBC: 7.7 10*3/uL (ref 4.0–10.5)
nRBC: 0 % (ref 0.0–0.2)

## 2018-07-04 LAB — SAMPLE TO BLOOD BANK

## 2018-07-04 LAB — IRON AND TIBC
Iron: 37 ug/dL (ref 28–170)
Saturation Ratios: 12 % (ref 10.4–31.8)
TIBC: 308 ug/dL (ref 250–450)
UIBC: 271 ug/dL

## 2018-07-04 LAB — FERRITIN: Ferritin: 33 ng/mL (ref 11–307)

## 2018-07-19 ENCOUNTER — Emergency Department: Payer: Medicare Other

## 2018-07-19 ENCOUNTER — Inpatient Hospital Stay
Admission: EM | Admit: 2018-07-19 | Discharge: 2018-07-21 | DRG: 308 | Disposition: A | Discharge status: Skilled Nursing Facility | Payer: Medicare Other | Source: Skilled Nursing Facility | Attending: Internal Medicine | Admitting: Internal Medicine

## 2018-07-19 ENCOUNTER — Other Ambulatory Visit: Payer: Self-pay

## 2018-07-19 ENCOUNTER — Encounter: Payer: Self-pay | Admitting: Emergency Medicine

## 2018-07-19 DIAGNOSIS — Z87442 Personal history of urinary calculi: Secondary | ICD-10-CM

## 2018-07-19 DIAGNOSIS — Z79899 Other long term (current) drug therapy: Secondary | ICD-10-CM

## 2018-07-19 DIAGNOSIS — Z9011 Acquired absence of right breast and nipple: Secondary | ICD-10-CM

## 2018-07-19 DIAGNOSIS — I130 Hypertensive heart and chronic kidney disease with heart failure and stage 1 through stage 4 chronic kidney disease, or unspecified chronic kidney disease: Secondary | ICD-10-CM | POA: Diagnosis present

## 2018-07-19 DIAGNOSIS — Z853 Personal history of malignant neoplasm of breast: Secondary | ICD-10-CM | POA: Diagnosis not present

## 2018-07-19 DIAGNOSIS — M1990 Unspecified osteoarthritis, unspecified site: Secondary | ICD-10-CM | POA: Diagnosis present

## 2018-07-19 DIAGNOSIS — I4891 Unspecified atrial fibrillation: Principal | ICD-10-CM | POA: Diagnosis present

## 2018-07-19 DIAGNOSIS — Z803 Family history of malignant neoplasm of breast: Secondary | ICD-10-CM

## 2018-07-19 DIAGNOSIS — Z8262 Family history of osteoporosis: Secondary | ICD-10-CM

## 2018-07-19 DIAGNOSIS — Z8249 Family history of ischemic heart disease and other diseases of the circulatory system: Secondary | ICD-10-CM

## 2018-07-19 DIAGNOSIS — R0989 Other specified symptoms and signs involving the circulatory and respiratory systems: Secondary | ICD-10-CM

## 2018-07-19 DIAGNOSIS — E785 Hyperlipidemia, unspecified: Secondary | ICD-10-CM | POA: Diagnosis present

## 2018-07-19 DIAGNOSIS — Z881 Allergy status to other antibiotic agents status: Secondary | ICD-10-CM

## 2018-07-19 DIAGNOSIS — Z882 Allergy status to sulfonamides status: Secondary | ICD-10-CM | POA: Diagnosis not present

## 2018-07-19 DIAGNOSIS — I248 Other forms of acute ischemic heart disease: Secondary | ICD-10-CM | POA: Diagnosis present

## 2018-07-19 DIAGNOSIS — R600 Localized edema: Secondary | ICD-10-CM | POA: Diagnosis not present

## 2018-07-19 DIAGNOSIS — Z79891 Long term (current) use of opiate analgesic: Secondary | ICD-10-CM

## 2018-07-19 DIAGNOSIS — I9589 Other hypotension: Secondary | ICD-10-CM | POA: Diagnosis present

## 2018-07-19 DIAGNOSIS — E1122 Type 2 diabetes mellitus with diabetic chronic kidney disease: Secondary | ICD-10-CM | POA: Diagnosis present

## 2018-07-19 DIAGNOSIS — Z87891 Personal history of nicotine dependence: Secondary | ICD-10-CM

## 2018-07-19 DIAGNOSIS — I739 Peripheral vascular disease, unspecified: Secondary | ICD-10-CM

## 2018-07-19 DIAGNOSIS — E1140 Type 2 diabetes mellitus with diabetic neuropathy, unspecified: Secondary | ICD-10-CM | POA: Diagnosis present

## 2018-07-19 DIAGNOSIS — E1151 Type 2 diabetes mellitus with diabetic peripheral angiopathy without gangrene: Secondary | ICD-10-CM | POA: Diagnosis present

## 2018-07-19 DIAGNOSIS — I509 Heart failure, unspecified: Secondary | ICD-10-CM | POA: Diagnosis not present

## 2018-07-19 DIAGNOSIS — Z794 Long term (current) use of insulin: Secondary | ICD-10-CM

## 2018-07-19 DIAGNOSIS — Z885 Allergy status to narcotic agent status: Secondary | ICD-10-CM | POA: Diagnosis not present

## 2018-07-19 DIAGNOSIS — C50911 Malignant neoplasm of unspecified site of right female breast: Secondary | ICD-10-CM

## 2018-07-19 DIAGNOSIS — Z923 Personal history of irradiation: Secondary | ICD-10-CM

## 2018-07-19 DIAGNOSIS — J45909 Unspecified asthma, uncomplicated: Secondary | ICD-10-CM | POA: Diagnosis present

## 2018-07-19 DIAGNOSIS — Z7982 Long term (current) use of aspirin: Secondary | ICD-10-CM | POA: Diagnosis not present

## 2018-07-19 DIAGNOSIS — N184 Chronic kidney disease, stage 4 (severe): Secondary | ICD-10-CM | POA: Diagnosis present

## 2018-07-19 DIAGNOSIS — N183 Chronic kidney disease, stage 3 (moderate): Secondary | ICD-10-CM

## 2018-07-19 DIAGNOSIS — I5023 Acute on chronic systolic (congestive) heart failure: Secondary | ICD-10-CM | POA: Diagnosis present

## 2018-07-19 DIAGNOSIS — I2510 Atherosclerotic heart disease of native coronary artery without angina pectoris: Secondary | ICD-10-CM | POA: Diagnosis present

## 2018-07-19 DIAGNOSIS — R911 Solitary pulmonary nodule: Secondary | ICD-10-CM

## 2018-07-19 LAB — BASIC METABOLIC PANEL
Anion gap: 9 (ref 5–15)
BUN: 52 mg/dL — ABNORMAL HIGH (ref 8–23)
CALCIUM: 8.6 mg/dL — AB (ref 8.9–10.3)
CO2: 20 mmol/L — ABNORMAL LOW (ref 22–32)
Chloride: 110 mmol/L (ref 98–111)
Creatinine, Ser: 1.98 mg/dL — ABNORMAL HIGH (ref 0.44–1.00)
GFR calc non Af Amer: 23 mL/min — ABNORMAL LOW (ref >60)
GFR, EST AFRICAN AMERICAN: 26 mL/min — AB (ref >60)
Glucose, Bld: 94 mg/dL (ref 70–99)
Potassium: 3.7 mmol/L (ref 3.5–5.1)
Sodium: 139 mmol/L (ref 135–145)

## 2018-07-19 LAB — CBC
HCT: 42.4 % (ref 36.0–46.0)
Hemoglobin: 12.4 g/dL (ref 12.0–15.0)
MCH: 27 pg (ref 26.0–34.0)
MCHC: 29.2 g/dL — ABNORMAL LOW (ref 30.0–36.0)
MCV: 92.4 fL (ref 80.0–100.0)
Platelets: 315 10*3/uL (ref 150–400)
RBC: 4.59 MIL/uL (ref 3.87–5.11)
RDW: 16.6 % — AB (ref 11.5–15.5)
WBC: 6.4 10*3/uL (ref 4.0–10.5)
nRBC: 0.3 % — ABNORMAL HIGH (ref 0.0–0.2)

## 2018-07-19 LAB — PROTIME-INR
INR: 1.1 (ref 0.8–1.2)
Prothrombin Time: 14.2 seconds (ref 11.4–15.2)

## 2018-07-19 LAB — APTT: aPTT: 45 seconds — ABNORMAL HIGH (ref 24–36)

## 2018-07-19 LAB — TROPONIN I: Troponin I: 0.1 ng/mL (ref <0.03)

## 2018-07-19 MED ORDER — MONTELUKAST SODIUM 10 MG PO TABS
10.0000 mg | ORAL_TABLET | Freq: Every day | ORAL | Status: DC
  Administered 2018-07-20 – 2018-07-21 (×2): 10 mg via ORAL
  Filled 2018-07-19 (×3): qty 1

## 2018-07-19 MED ORDER — ACETAMINOPHEN 325 MG PO TABS
650.0000 mg | ORAL_TABLET | Freq: Four times a day (QID) | ORAL | Status: DC | PRN
  Administered 2018-07-19 – 2018-07-20 (×2): 650 mg via ORAL
  Filled 2018-07-19 (×2): qty 2

## 2018-07-19 MED ORDER — VITAMIN D3 25 MCG (1000 UNIT) PO TABS
1000.0000 [IU] | ORAL_TABLET | Freq: Every day | ORAL | Status: DC
  Administered 2018-07-20 – 2018-07-21 (×2): 1000 [IU] via ORAL
  Filled 2018-07-19 (×2): qty 1

## 2018-07-19 MED ORDER — ATORVASTATIN CALCIUM 20 MG PO TABS: 80.0000 mg | ORAL_TABLET | Freq: Every day | ORAL | Status: DC

## 2018-07-19 MED ORDER — METOPROLOL SUCCINATE ER 25 MG PO TB24
25.0000 mg | ORAL_TABLET | Freq: Every day | ORAL | Status: DC
  Administered 2018-07-19 – 2018-07-21 (×2): 25 mg via ORAL
  Filled 2018-07-19 (×3): qty 1

## 2018-07-19 MED ORDER — SUCRALFATE 1 GM/10ML PO SUSP
1.0000 g | Freq: Three times a day (TID) | ORAL | Status: DC
  Administered 2018-07-20 – 2018-07-21 (×7): 1 g via ORAL
  Filled 2018-07-19 (×10): qty 10

## 2018-07-19 MED ORDER — FUROSEMIDE 10 MG/ML IJ SOLN
20.0000 mg | Freq: Two times a day (BID) | INTRAMUSCULAR | Status: DC
  Administered 2018-07-20 – 2018-07-21 (×3): 20 mg via INTRAVENOUS
  Filled 2018-07-19 (×3): qty 2
  Filled 2018-07-19: qty 4

## 2018-07-19 MED ORDER — ASPIRIN EC 81 MG PO TBEC
81.0000 mg | DELAYED_RELEASE_TABLET | Freq: Every day | ORAL | Status: DC
  Administered 2018-07-20 – 2018-07-21 (×2): 81 mg via ORAL
  Filled 2018-07-19 (×2): qty 1

## 2018-07-19 MED ORDER — DULOXETINE HCL 30 MG PO CPEP
60.0000 mg | ORAL_CAPSULE | Freq: Every day | ORAL | Status: DC
  Administered 2018-07-20 – 2018-07-21 (×2): 60 mg via ORAL
  Filled 2018-07-19 (×2): qty 2

## 2018-07-19 MED ORDER — HEPARIN (PORCINE) 25000 UT/250ML-% IV SOLN
950.0000 [IU]/h | INTRAVENOUS | Status: DC
  Administered 2018-07-19: 1000 [IU]/h via INTRAVENOUS
  Filled 2018-07-19: qty 250

## 2018-07-19 MED ORDER — FERROUS SULFATE 325 (65 FE) MG PO TABS
325.0000 mg | ORAL_TABLET | ORAL | Status: DC
  Administered 2018-07-21: 325 mg via ORAL
  Filled 2018-07-19: qty 1

## 2018-07-19 MED ORDER — ONDANSETRON HCL 4 MG PO TABS: 4.0000 mg | ORAL_TABLET | Freq: Four times a day (QID) | ORAL | Status: DC | PRN

## 2018-07-19 MED ORDER — HEPARIN BOLUS VIA INFUSION
3600.0000 [IU] | Freq: Once | INTRAVENOUS | Status: AC
  Administered 2018-07-19: 3600 [IU] via INTRAVENOUS
  Filled 2018-07-19: qty 3600

## 2018-07-19 MED ORDER — ALBUTEROL SULFATE (2.5 MG/3ML) 0.083% IN NEBU: 2.5000 mg | INHALATION_SOLUTION | RESPIRATORY_TRACT | Status: DC | PRN

## 2018-07-19 MED ORDER — ACETAMINOPHEN 650 MG RE SUPP: 650.0000 mg | Freq: Four times a day (QID) | RECTAL | Status: DC | PRN

## 2018-07-19 MED ORDER — MIDODRINE HCL 5 MG PO TABS
5.0000 mg | ORAL_TABLET | Freq: Two times a day (BID) | ORAL | Status: DC
  Administered 2018-07-20 – 2018-07-21 (×3): 5 mg via ORAL
  Filled 2018-07-19 (×4): qty 1

## 2018-07-19 MED ORDER — HYDROCODONE-ACETAMINOPHEN 5-325 MG PO TABS: 1.0000 | ORAL_TABLET | Freq: Two times a day (BID) | ORAL | Status: DC | PRN

## 2018-07-19 MED ORDER — GABAPENTIN 600 MG PO TABS
600.0000 mg | ORAL_TABLET | Freq: Three times a day (TID) | ORAL | Status: DC
  Administered 2018-07-20 – 2018-07-21 (×5): 600 mg via ORAL
  Filled 2018-07-19 (×5): qty 1

## 2018-07-19 MED ORDER — INSULIN NPH (HUMAN) (ISOPHANE) 100 UNIT/ML ~~LOC~~ SUSP
6.0000 [IU] | Freq: Every day | SUBCUTANEOUS | Status: DC
  Administered 2018-07-20: 6 [IU] via SUBCUTANEOUS
  Filled 2018-07-19: qty 0.06
  Filled 2018-07-19: qty 10

## 2018-07-19 MED ORDER — INSULIN ASPART 100 UNIT/ML ~~LOC~~ SOLN
0.0000 [IU] | Freq: Every day | SUBCUTANEOUS | Status: DC
  Administered 2018-07-20: 2 [IU] via SUBCUTANEOUS
  Filled 2018-07-19 (×2): qty 1

## 2018-07-19 MED ORDER — INSULIN ASPART 100 UNIT/ML ~~LOC~~ SOLN
0.0000 [IU] | Freq: Three times a day (TID) | SUBCUTANEOUS | Status: DC
  Administered 2018-07-20: 1 [IU] via SUBCUTANEOUS
  Administered 2018-07-20: 2 [IU] via SUBCUTANEOUS
  Administered 2018-07-20: 1 [IU] via SUBCUTANEOUS
  Administered 2018-07-21 (×2): 2 [IU] via SUBCUTANEOUS
  Filled 2018-07-19 (×5): qty 1

## 2018-07-19 MED ORDER — ONDANSETRON HCL 4 MG/2ML IJ SOLN: 4.0000 mg | Freq: Four times a day (QID) | INTRAMUSCULAR | Status: DC | PRN

## 2018-07-19 NOTE — ED Notes (Signed)
Pt has poor circulation and cool extremities. RR even and unlabored.

## 2018-07-19 NOTE — ED Notes (Signed)
Spoke with MD. Quentin Cornwall at this time about pt presentation, see orders

## 2018-07-19 NOTE — Consult Note (Signed)
[facility] Consultation Note  Patient ID: Andrea Wallace, MRN: [id number], DOB/AGE: 09-06-1931 85 y.o. Admit date: 07/19/2018   Date of Consult: 07/19/2018 Primary Physician: Kirk Ruths, MD Primary Cardiologist: None  Chief Complaint:  Chief Complaint  Patient presents with  . Leg Pain   Reason for Consult: Atrial fibrillation  HPI: 85 y.o. female with new onset of lower extremity edema shortness of breath and weight gain over the last several weeks with a recent echocardiogram performed showing global LV systolic dysfunction with ejection fraction of 25% and chronic kidney disease stage IV.  The patient then had worsening dizziness shortness of breath and was seen in the emergency room with an EKG showing atrial fibrillation with rapid ventricular rate.  The heart rate has been improved with appropriate medication management and the patient feels much better.  Her troponin level has elevated to 0.1 consistent with demand ischemia.  Currently she is hemodynamically stable with a chest x-ray consistent with some pulmonary edema.  Past Medical History:  Diagnosis Date  . Allergy   . Anemia   . Arthritis   . Asthma    stress related per pt  . Blood transfusion   . Breast cancer [facility]) 2011   Right  . Cancer ([place])    right breast  . Complication of anesthesia    diff  waking up  . Cough   . Diabetes mellitus   . Family history of adverse reaction to anesthesia    " SISTER HAS DIFFICULTY WAKING "  . Generalized headaches   . History of kidney stones   . Hyperlipidemia   . Lung nodules   . Nasal congestion   . Neuromuscular disorder ([place])    neuropathy feet/legs/hands      Surgical History:  Past Surgical History:  Procedure Laterality Date  . BACK SURGERY    . BREAST SURGERY  06/2007   right mastectomy  . BREAST SURGERY  2011   left  . FOOT SURGERY    . KIDNEY STONE SURGERY    . LITHOTRIPSY    . MASTECTOMY Right 2011   with Radiaiton  . ORIF  ELBOW FRACTURE Right 10/28/2016   Procedure: OPEN REDUCTION INTERNAL FIXATION (ORIF) ELBOW/OLECRANON FRACTURE;  Surgeon: Dorna Leitz, MD;  Location: [place];  Service: Orthopedics;  Laterality: Right;  . REDUCTION MAMMAPLASTY Left 2012  . SHOULDER ARTHROSCOPY     bilateral  . TOTAL KNEE ARTHROPLASTY    . VISCERAL ANGIOGRAPHY N/A 08/06/2016   Procedure: Visceral Angiography;  Surgeon: Algernon Huxley, MD;  Location: [place] [facility];  Service: Cardiovascular;  Laterality: N/A;  . VISCERAL ARTERY INTERVENTION N/A 08/06/2016   Procedure: Visceral Artery Intervention;  Surgeon: Algernon Huxley, MD;  Location: [place] [facility];  Service: Cardiovascular;  Laterality: N/A;     Home Meds: Prior to Admission medications   Medication Sig Start Date End Date Taking? Authorizing Provider  acetaminophen (TYLENOL) 500 MG tablet Take 1,000 mg by mouth every 4 (four) hours as needed for moderate pain.    Yes [provider]  albuterol (PROVENTIL HFA;VENTOLIN HFA) 108 (90 Base) MCG/ACT inhaler Inhale 2 puffs into the lungs every 4 (four) hours as needed for wheezing or shortness of breath.   Yes [provider]  aspirin 81 MG tablet Take 81 mg by mouth daily.    Yes [provider]  atorvastatin (LIPITOR) 80 MG tablet TAKE 1 TABLET BY MOUTH ONCE DAILY 12/21/16  Yes [provider]  cholecalciferol (VITAMIN D) 1000 units tablet Take 1,000 Units by mouth daily.   Yes [provider]  dextromethorphan-guaiFENesin (MUCINEX DM) 30-600 MG 12hr tablet Take 1 tablet by mouth daily. TAKE 1 TABLET BY MOUTH ONCE DAILY FOR COUGHING.   Yes [provider]  DULoxetine (CYMBALTA) 60 MG capsule TAKE ONE CAPSULE BY MOUTH ONCE DAILY 02/03/17  Yes Elayne Snare, MD  Ferrous Sulfate (IRON) 325 (65 Fe) MG TABS Take 1 tablet (325 mg total) by mouth every other day. 02/03/18  Yes Merlyn Lot, MD  gabapentin (NEURONTIN) 600 MG tablet TAKE ONE TABLET BY MOUTH THREE TIMES DAILY 12/07/16   Yes Elayne Snare, MD  HYDROcodone-acetaminophen (NORCO) 10-325 MG tablet Take 1 tablet by mouth 2 (two) times daily as needed for moderate pain. Patient taking differently: Take 1 tablet by mouth every 12 (twelve) hours as needed for moderate pain.  12/01/17  Yes Elayne Snare, MD  imiquimod [place]) 5 % cream Apply topically 3 (three) times a week. Apply to right upper arm topically at bedtime every Tuesday, Thursday, ans Saturday   Yes [provider]  insulin NPH Human (NOVOLIN N RELION) 100 UNIT/ML injection Inject 40 units before breakfast and 10 units at bedtime Patient taking differently: Inject 6 Units into the skin at bedtime.  09/02/17  Yes Renato Shin, MD  insulin regular (NOVOLIN R RELION) 100 units/mL injection Use 10 units in the morning and 20 units at supper Patient taking differently: Inject 20 units in the morning, 10 units in the evening; hold for b/s less than 50 09/02/17  Yes Renato Shin, MD  LORazepam (ATIVAN) 1 MG tablet Take 1 mg by mouth as needed.   Yes [provider]  midodrine (PROAMATINE) 5 MG tablet Take 5 mg by mouth 2 (two) times daily with a meal. TAKE 1 TABLET BY MOUTH 2 TIMES DAILY. 02/03/18 02/03/19 Yes [provider]  montelukast (SINGULAIR) 10 MG tablet Take 10 mg by mouth daily.  03/02/11  Yes [provider]  sucralfate (CARAFATE) 1 GM/10ML suspension Take 1 g by mouth 4 (four) times daily -  before meals and at bedtime. 07/13/18 07/21/18 Yes [provider]    Inpatient Medications:  . insulin aspart  0-5 Units Subcutaneous QHS  . [START ON 07/20/2018] insulin aspart  0-9 Units Subcutaneous TID WC   . heparin 1,000 Units/hr (07/19/18 1724)    Allergies:  Allergies  Allergen Reactions  . Ciprofloxacin Diarrhea and Swelling  . Codeine Sulfate Itching  . Other     Other reaction(s): Other (See Comments) Mycins  . Sulfa Antibiotics     Other reaction(s): RASH    Social History   Socioeconomic History  .  Marital status: Widowed    Spouse name: Not on file  . Number of children: Not on file  . Years of education: Not on file  . Highest education level: Not on file  Occupational History  . Not on file  Social Needs  . Financial resource strain: Not on file  . Food insecurity:    Worry: Not on file    Inability: Not on file  . Transportation needs:    Medical: Not on file    Non-medical: Not on file  Tobacco Use  . Smoking status: Former Smoker    Types: Cigarettes    Last attempt to quit: 03/15/1984    Years since quitting: 34.3  . Smokeless tobacco: Never Used  Substance and Sexual Activity  . Alcohol use: Yes  Comment: occ  . Drug use: No  . Sexual activity: Not on file  Lifestyle  . Physical activity:    Days per week: Not on file    Minutes per session: Not on file  . Stress: Not on file  Relationships  . Social connections:    Talks on phone: Not on file    Gets together: Not on file    Attends religious service: Not on file    Active member of club or organization: Not on file    Attends meetings of clubs or organizations: Not on file    Relationship status: Not on file  . Intimate partner violence:    Fear of current or ex partner: Not on file    Emotionally abused: Not on file    Physically abused: Not on file    Forced sexual activity: Not on file  Other Topics Concern  . Not on file  Social History Narrative  . Not on file     Family History  Problem Relation Age of Onset  . Osteoporosis Mother   . Heart disease Father   . Cancer Brother   . Breast cancer Brother   . Heart disease Sister      Review of Systems Positive for shortness of breath palpitations Negative for: General:  chills, fever, night sweats or weight changes.  Cardiovascular: PND orthopnea syncope dizziness  Dermatological skin lesions rashes Respiratory: Cough congestion Urologic: Frequent urination urination at night and hematuria Abdominal: negative for nausea, vomiting,  diarrhea, bright red blood per rectum, melena, or hematemesis Neurologic: negative for visual changes, and/or hearing changes  All other systems reviewed and are otherwise negative except as noted above.  Labs: Recent Labs    07/19/18 1342  TROPONINI 0.10*   Lab Results  Component Value Date   WBC 6.4 07/19/2018   HGB 12.4 07/19/2018   HCT 42.4 07/19/2018   MCV 92.4 07/19/2018   PLT 315 07/19/2018    Recent Labs  Lab 07/19/18 1342  NA 139  K 3.7  CL 110  CO2 20*  BUN 52*  CREATININE 1.98*  CALCIUM 8.6*  GLUCOSE 94   Lab Results  Component Value Date   CHOL 166 10/12/2017   HDL 40.00 10/12/2017   LDLCALC 90 10/12/2017   TRIG 180.0 (H) 10/12/2017   No results found for: DDIMER  Radiology/Studies:  Dg Chest Port 1 View  Result Date: 07/19/2018 CLINICAL DATA:  85 year old female with a history of bilateral foot pain and swelling EXAM: PORTABLE CHEST 1 VIEW COMPARISON:  02/04/2018, chest CT 05/10/2018 FINDINGS: Cardiomediastinal silhouette unchanged in size and contour with cardiomegaly. Coarsened interstitial markings throughout the lungs. No evidence of confluent airspace disease. No pleural effusion or pneumothorax. No displaced fracture. IMPRESSION: Chronic lung changes without evidence of acute cardiopulmonary disease Electronically Signed   By: Corrie Mckusick D.O.   On: 07/19/2018 18:26    EKG: Atrial fibrillation with rapid ventricular rate nonspecific ST changes  Weights: Filed Weights   07/19/18 1652  Weight: 75 kg     Physical Exam: Blood pressure (!) 89/67, pulse 63, temperature (!) 95.9 F (35.5 C), temperature source Axillary, resp. rate 18, weight 75 kg, SpO2 94 %. Body mass index is 27.51 kg/m. General: Well developed, well nourished, in no acute distress. Head eyes ears nose throat: Normocephalic, atraumatic, sclera non-icteric, no xanthomas, nares are without discharge. No apparent thyromegaly and/or mass  Lungs: Normal respiratory effort.  no  wheezes, few basilar rales, no rhonchi.  Heart: Irregular with normal S1 S2. no murmur gallop, no rub, PMI is normal size and placement, carotid upstroke normal without bruit, jugular venous pressure is normal Abdomen: Soft, non-tender, non-distended with normoactive bowel sounds. No hepatomegaly. No rebound/guarding. No obvious abdominal masses. Abdominal aorta is normal size without bruit Extremities: 1+ edema. no cyanosis, no clubbing, no ulcers  Peripheral : 2+ bilateral upper extremity pulses, 2+ bilateral femoral pulses, 2+ bilateral dorsal pedal pulse Neuro: Alert and oriented. No facial asymmetry. No focal deficit. Moves all extremities spontaneously. Musculoskeletal: Normal muscle tone without kyphosis Psych:  Responds to questions appropriately with a normal affect.    Assessment: 85 year old female with new onset cardiomyopathy and acute congestive heart failure with chronic kidney disease stage IV and atrial fibrillation with rapid ventricular rate with demand ischemia elevation of troponin  Plan: 1.  Continue use of metoprolol orally in incremental doses for better heart rate control of atrial fibrillation and treatment of acute with Lasix 2.  Diuresis for lower extremity edema pulmonary edema and acute systolic dysfunction congestive heart failure with Lasix 3.  Anticoagulation for further risk reduction in stroke with atrial fibrillation with heparin and then consider anticoagulation with Eliquis 4.  Adjustments of medication management as per above 5.  No further cardiac diagnostics necessary at this time  Signed, Corey Skains M.D. [facility] 07/19/2018, 7:32 PM

## 2018-07-19 NOTE — ED Notes (Signed)
Pt assisted to bedside commode with this RN and Mayra, NT. Pt requires moderate assistance to bedside commode. Peri-care performed and pt assisted back to bed. This RN will continue to monitor.

## 2018-07-19 NOTE — ED Provider Notes (Addendum)
[facility] Emergency Department Provider Note  ____________________________________________   I have reviewed the triage vital signs and the nursing notes. Where available I have reviewed prior notes and, if possible and indicated, outside hospital notes.    HISTORY  Chief Complaint Leg Pain    HPI Andrea Wallace is a 85 y.o. female  history of vascular pathology, history of diabetes mellitus, CAD SMA gnosis with stents in the past, history of renal insufficiency presents today complaining of her feet being in pain.  Right greater than left over the last couple days.  No other complaints no chest pain or shortness of breath no nausea no vomiting, has intact sensation, has not had any trauma, it was noted that her feet were different in appearance from what they were few days ago and she was sent in.  Has not had this before to her recollection.  History is somewhat limited.  I did ask her she has a history of irregular heartbeat and she states that she does, however, later review of history shows that there is no evidence of that     Past Medical History:  Diagnosis Date  . Allergy   . Anemia   . Arthritis   . Asthma    stress related per pt  . Blood transfusion   . Breast cancer [facility]) 2011   Right  . Cancer ([place])    right breast  . Complication of anesthesia    diff  waking up  . Cough   . Diabetes mellitus   . Family history of adverse reaction to anesthesia    " SISTER HAS DIFFICULTY WAKING "  . Generalized headaches   . History of kidney stones   . Hyperlipidemia   . Lung nodules   . Nasal congestion   . Neuromuscular disorder ([place])    neuropathy feet/legs/hands    Patient Active Problem List   Diagnosis Date Noted  . Chronic orthostatic hypotension 02/11/2018  . Chest pain 02/04/2018  . PAD (peripheral artery disease) ([place]) 03/23/2017  . SMA stenosis ([place]) 03/23/2017  . Displaced fracture of olecranon process of right ulna with  intra-articular extension 10/28/2016  . Displaced fracture of olecranon process with intraarticular extension of right ulna, initial encounter for closed fracture 10/28/2016  . Essential hypertension, benign 07/28/2016  . Hyperlipidemia 07/28/2016  . Abdominal pain 07/28/2016  . Chronic mesenteric ischemia ([place]) 07/28/2016  . Diabetic polyneuropathy associated with type 2 diabetes mellitus ([place]) 02/25/2016  . Mass of lower lobe of left lung 02/16/2016  . Calculus of kidney 08/14/2015  . Abnormal presence of protein in urine 07/29/2015  . Osteopenia 12/18/2014  . Atherosclerosis of abdominal aorta ([place]) 03/24/2014  . Carotid arterial disease ([place]) 01/06/2014  . Headache, migraine 01/06/2014  . Lumbar canal stenosis 01/06/2014  . Carotid artery disease ([place]) 01/06/2014  . Asthma in adult without complication 85/27/7824  . CAD (coronary artery disease) 01/06/2014  . OA (osteoarthritis) 01/06/2014  . Thyroid goiter 01/06/2014  . Back pain, chronic 10/30/2013  . Chronic kidney disease, stage III (moderate) ([place]) 05/03/2013  . Type II diabetes mellitus, uncontrolled ([place]) 01/11/2013  . Other and unspecified hyperlipidemia 12/28/2012  . Multinodular goiter 11/01/2012  . Diabetes ([place]) 06/29/2012  . Lumbar stenosis with neurogenic claudication 06/29/2012  . Anemia associated with acute blood loss 06/29/2012  . Breast cancer ([place]) 03/16/2011    Past Surgical History:  Procedure Laterality Date  . BACK SURGERY    . BREAST SURGERY  06/2007  right mastectomy  . BREAST SURGERY  2011   left  . FOOT SURGERY    . KIDNEY STONE SURGERY    . LITHOTRIPSY    . MASTECTOMY Right 2011   with Radiaiton  . ORIF ELBOW FRACTURE Right 10/28/2016   Procedure: OPEN REDUCTION INTERNAL FIXATION (ORIF) ELBOW/OLECRANON FRACTURE;  Surgeon: Dorna Leitz, MD;  Location: [facility];  Service: Orthopedics;  Laterality: Right;  . REDUCTION MAMMAPLASTY Left 2012  . SHOULDER ARTHROSCOPY     bilateral  . TOTAL KNEE  ARTHROPLASTY    . VISCERAL ANGIOGRAPHY N/A 08/06/2016   Procedure: Visceral Angiography;  Surgeon: Algernon Huxley, MD;  Location: [facility];  Service: Cardiovascular;  Laterality: N/A;  . VISCERAL ARTERY INTERVENTION N/A 08/06/2016   Procedure: Visceral Artery Intervention;  Surgeon: Algernon Huxley, MD;  Location: [facility];  Service: Cardiovascular;  Laterality: N/A;    Prior to Admission medications   Medication Sig Start Date End Date Taking? Authorizing Provider  acetaminophen (TYLENOL) 500 MG tablet Take 1,000 mg by mouth every 4 (four) hours as needed for moderate pain.    Yes [provider]  albuterol (PROVENTIL HFA;VENTOLIN HFA) 108 (90 Base) MCG/ACT inhaler Inhale 2 puffs into the lungs every 4 (four) hours as needed for wheezing or shortness of breath.   Yes [provider]  aspirin 81 MG tablet Take 81 mg by mouth daily.    Yes [provider]  atorvastatin (LIPITOR) 80 MG tablet TAKE 1 TABLET BY MOUTH ONCE DAILY 12/21/16  Yes [provider]  cholecalciferol (VITAMIN D) 1000 units tablet Take 1,000 Units by mouth daily.   Yes [provider]  dextromethorphan-guaiFENesin (MUCINEX DM) 30-600 MG 12hr tablet Take 1 tablet by mouth daily. TAKE 1 TABLET BY MOUTH ONCE DAILY FOR COUGHING.   Yes [provider]  DULoxetine (CYMBALTA) 60 MG capsule TAKE ONE CAPSULE BY MOUTH ONCE DAILY 02/03/17  Yes Elayne Snare, MD  Ferrous Sulfate (IRON) 325 (65 Fe) MG TABS Take 1 tablet (325 mg total) by mouth every other day. 02/03/18  Yes Merlyn Lot, MD  gabapentin (NEURONTIN) 600 MG tablet TAKE ONE TABLET BY MOUTH THREE TIMES DAILY 12/07/16  Yes Elayne Snare, MD  HYDROcodone-acetaminophen (NORCO) 10-325 MG tablet Take 1 tablet by mouth 2 (two) times daily as needed for moderate pain. Patient taking differently: Take 1 tablet by mouth every 12 (twelve) hours as needed for moderate pain.  12/01/17  Yes Elayne Snare, MD  imiquimod [place]) 5  % cream Apply topically 3 (three) times a week. Apply to right upper arm topically at bedtime every Tuesday, Thursday, ans Saturday   Yes [provider]  insulin NPH Human (NOVOLIN N RELION) 100 UNIT/ML injection Inject 40 units before breakfast and 10 units at bedtime Patient taking differently: Inject 6 Units into the skin at bedtime.  09/02/17  Yes Renato Shin, MD  insulin regular (NOVOLIN R RELION) 100 units/mL injection Use 10 units in the morning and 20 units at supper Patient taking differently: Inject 20 units in the morning, 10 units in the evening; hold for b/s less than 50 09/02/17  Yes Renato Shin, MD  LORazepam (ATIVAN) 1 MG tablet Take 1 mg by mouth as needed.   Yes [provider]  midodrine (PROAMATINE) 5 MG tablet Take 5 mg by mouth 2 (two) times daily with a meal. TAKE 1 TABLET BY MOUTH 2 TIMES DAILY. 02/03/18 02/03/19 Yes [provider]  montelukast (SINGULAIR) 10 MG tablet  Take 10 mg by mouth daily.  03/02/11  Yes [provider]  sucralfate (CARAFATE) 1 GM/10ML suspension Take 1 g by mouth 4 (four) times daily -  before meals and at bedtime. 07/13/18 07/21/18 Yes [provider]    Allergies Ciprofloxacin; Codeine sulfate; Other; and Sulfa antibiotics  Family History  Problem Relation Age of Onset  . Osteoporosis Mother   . Heart disease Father   . Cancer Brother   . Breast cancer Brother   . Heart disease Sister     Social History Social History   Tobacco Use  . Smoking status: Former Smoker    Last attempt to quit: 03/15/1984    Years since quitting: 34.3  . Smokeless tobacco: Never Used  Substance Use Topics  . Alcohol use: Yes    Comment: occ  . Drug use: No    Review of Systems Constitutional: No fever/chills Eyes: No visual changes. ENT: No sore throat. No stiff neck no neck pain Cardiovascular: Denies chest pain. Respiratory: Denies shortness of breath. Gastrointestinal:   no vomiting.  No diarrhea.  No  constipation. Genitourinary: Negative for dysuria. Musculoskeletal: Negative lower extremity swelling Skin: Negative for rash. Neurological: Negative for severe headaches, focal weakness or numbness.   ____________________________________________   PHYSICAL EXAM:  VITAL SIGNS: ED Triage Vitals  Enc Vitals Group     BP --      Pulse Rate 07/19/18 1328 (!) 104     Resp 07/19/18 1328 16     Temp 07/19/18 1331 (!) 95.9 F (35.5 C)     Temp Source 07/19/18 1331 Axillary     SpO2 07/19/18 1337 92 %     Weight --      Height --      Head Circumference --      Peak Flow --      Pain Score 07/19/18 1328 0     Pain Loc --      Pain Edu? --      Excl. in [place]? --     Constitutional: Alert and oriented. Well appearing and in no acute distress. Eyes: Conjunctivae are normal Head: Atraumatic HEENT: No congestion/rhinnorhea. Mucous membranes are moist.  Oropharynx non-erythematous Neck:   Nontender with no meningismus, no masses, no stridor Cardiovascular: irRegularly irregular rhythm. Grossly normal heart sounds.  Good peripheral circulation. Respiratory: Normal respiratory effort.  No retractions. Lungs CTAB. Abdominal: Soft and nontender. No distention. No guarding no rebound Back:  There is no focal tenderness or step off.  there is no midline tenderness there are no lesions noted. there is no CVA tenderness Musculoskeletal: Both feet are cool to touch however both have dopplerable DP and posterior tibial pulses, the right and left foot both have discoloration associated with poor vascular flow including discoloration of the toes.  There is some petechial rash.  There is no evidence of cellulitis or DVT no upper extremity tenderness. No joint effusions, no DVT signs strong distal pulses no edema Neurologic:  Normal speech and language. No gross focal neurologic deficits are appreciated.  Skin:  Skin is warm, dry and intact. No rash noted. Psychiatric: Mood and affect are normal. Speech  and behavior are normal.  ____________________________________________   LABS (all labs ordered are listed, but only abnormal results are displayed)  Labs Reviewed  BASIC METABOLIC PANEL - Abnormal; Notable for the following components:      Result Value   CO2 20 (*)    BUN 52 (*)    Creatinine, Ser  1.98 (*)    Calcium 8.6 (*)    GFR calc non Af Amer 23 (*)    GFR calc Af Amer 26 (*)    All other components within normal limits  CBC - Abnormal; Notable for the following components:   MCHC 29.2 (*)    RDW 16.6 (*)    nRBC 0.3 (*)    All other components within normal limits  TROPONIN I - Abnormal; Notable for the following components:   Troponin I 0.10 (*)    All other components within normal limits  PROTIME-INR  APTT  APTT  PROTIME-INR    Pertinent labs  results that were available during my care of the patient were reviewed by me and considered in my medical decision making (see chart for details). ____________________________________________  EKG  I personally interpreted any EKGs ordered by me or triage  ____________________________________________  RADIOLOGY  Pertinent labs & imaging results that were available during my care of the patient were reviewed by me and considered in my medical decision making (see chart for details). If possible, patient and/or family made aware of any abnormal findings.  No results found. ____________________________________________    PROCEDURES  Procedure(s) performed: None  Procedures  Critical Care performed:  CRITICAL CARE Performed by: Schuyler Amor   Total critical care time: 45 minutes  Critical care time was exclusive of separately billable procedures and treating other patients.  Critical care was necessary to treat or prevent imminent or life-threatening deterioration.  Critical care was time spent personally by me on the following activities: development of treatment plan with patient and/or surrogate as  well as nursing, discussions with consultants, evaluation of patient's response to treatment, examination of patient, obtaining history from patient or surrogate, ordering and performing treatments and interventions, ordering and review of laboratory studies, ordering and review of radiographic studies, pulse oximetry and re-evaluation of patient's condition.   ____________________________________________   INITIAL IMPRESSION / ASSESSMENT AND PLAN / ED COURSE  Pertinent labs & imaging results that were available during my care of the patient were reviewed by me and considered in my medical decision making (see chart for details).  Patient here with decreased vascular flow to her extremities, it appears that she is in new onset atrial fibrillation which could be accountable for this.  I did talk to dr. Delana Meyer of  vascular surgery they agree with heparin which I have ordered, they do not want any imaging at this time. I appreciate consult.  we will see about getting the patient admitted. no obvious contraindications to heparin available.  No evidence of acute ischemia, however, I suspect that she is having some perfusion issues because of her rhythm.  We will also talk to cardiology.  ----------------------------------------- 4:47 PM on 07/19/2018 -----------------------------------------  I d/w Dr. Nehemiah Massed of cardiology who agrees w Zachery Dakins and will evaluate.     ____________________________________________   FINAL CLINICAL IMPRESSION(S) / ED DIAGNOSES  Final diagnoses:  None      This chart was dictated using voice recognition software.  Despite best efforts to proofread,  errors can occur which can change meaning.      Schuyler Amor, MD 07/19/18 1635    Schuyler Amor, MD 07/19/18 1647    Schuyler Amor, MD 07/19/18 [phone]

## 2018-07-19 NOTE — H&P (Signed)
[facility] NAME: Andrea Wallace    MR#:  [id number]  DATE OF BIRTH:  [birth year]  DATE OF ADMISSION:  07/19/2018  PRIMARY CARE PHYSICIAN: Kirk Ruths, MD   REQUESTING/REFERRING PHYSICIAN: Dr. Charlotte Crumb  CHIEF COMPLAINT:   Chief Complaint  Patient presents with  . Leg Pain    HISTORY OF PRESENT ILLNESS:  Andrea Wallace  is a 85 y.o. female with a known history of diabetes, hypertension, hyperlipidemia, history of breast cancer, osteoarthritis, chronic hypotension who presents to the hospital due to worsening lower extremity edema and discoloration of her lower extremities and feet bilaterally.  Patient resides at the [facility] and over the past few days the staff there has noticed that her feet are becoming more discolored and cool to touch.  She was therefore sent to the ER for further evaluation.  Initially since her extremities were cool to touch there was a concern that she had a vascular issue in a flow problem and therefore vascular surgery was consulted.  Surgery does not think that there is a acute arterial poor flow issue presently.  Patient denies any lower extremity pain.  There is been no fever no chills no nausea or vomiting.  Patient does say that she has developed worsening dyspnea over the past few weeks to a month.  She was seen by her primary care physician recently and told she has congestive heart failure but she is currently not on diuretics.  She had an echocardiogram done as an outpatient last month which showed severe global LV dysfunction with a EF of 25%.  She presents to the ER with the above complaints and was also noted to be in new onset atrial fibrillation with variable rates and also with worsening lower extreme edema thought to be in mild congestive heart failure.  Hospitalist services were contacted for admission.   PAST MEDICAL HISTORY:   Past Medical History:  Diagnosis Date  . Allergy    . Anemia   . Arthritis   . Asthma    stress related per pt  . Blood transfusion   . Breast cancer [facility]) 2011   Right  . Cancer ([place])    right breast  . Complication of anesthesia    diff  waking up  . Cough   . Diabetes mellitus   . Family history of adverse reaction to anesthesia    " SISTER HAS DIFFICULTY WAKING "  . Generalized headaches   . History of kidney stones   . Hyperlipidemia   . Lung nodules   . Nasal congestion   . Neuromuscular disorder ([place])    neuropathy feet/legs/hands    PAST SURGICAL HISTORY:   Past Surgical History:  Procedure Laterality Date  . BACK SURGERY    . BREAST SURGERY  06/2007   right mastectomy  . BREAST SURGERY  2011   left  . FOOT SURGERY    . KIDNEY STONE SURGERY    . LITHOTRIPSY    . MASTECTOMY Right 2011   with Radiaiton  . ORIF ELBOW FRACTURE Right 10/28/2016   Procedure: OPEN REDUCTION INTERNAL FIXATION (ORIF) ELBOW/OLECRANON FRACTURE;  Surgeon: Dorna Leitz, MD;  Location: [facility];  Service: Orthopedics;  Laterality: Right;  . REDUCTION MAMMAPLASTY Left 2012  . SHOULDER ARTHROSCOPY     bilateral  . TOTAL KNEE ARTHROPLASTY    . VISCERAL ANGIOGRAPHY N/A 08/06/2016   Procedure: Visceral Angiography;  Surgeon: Algernon Huxley, MD;  Location: [facility];  Service: Cardiovascular;  Laterality: N/A;  . VISCERAL ARTERY INTERVENTION N/A 08/06/2016   Procedure: Visceral Artery Intervention;  Surgeon: Algernon Huxley, MD;  Location: [facility];  Service: Cardiovascular;  Laterality: N/A;    SOCIAL HISTORY:   Social History   Tobacco Use  . Smoking status: Former Smoker    Types: Cigarettes    Last attempt to quit: 03/15/1984    Years since quitting: 34.3  . Smokeless tobacco: Never Used  Substance Use Topics  . Alcohol use: Yes    Comment: occ    FAMILY HISTORY:   Family History  Problem Relation Age of Onset  . Osteoporosis Mother   . Heart disease Father   . Cancer Brother   . Breast cancer Brother   .  Heart disease Sister     DRUG ALLERGIES:   Allergies  Allergen Reactions  . Ciprofloxacin Diarrhea and Swelling  . Codeine Sulfate Itching  . Other     Other reaction(s): Other (See Comments) Mycins  . Sulfa Antibiotics     Other reaction(s): RASH    REVIEW OF SYSTEMS:   Review of Systems  Constitutional: Negative for fever and weight loss.  HENT: Negative for congestion, nosebleeds and tinnitus.   Eyes: Negative for blurred vision, double vision and redness.  Respiratory: Positive for shortness of breath. Negative for cough and hemoptysis.   Cardiovascular: Positive for leg swelling. Negative for chest pain, orthopnea and PND.  Gastrointestinal: Negative for abdominal pain, diarrhea, melena, nausea and vomiting.  Genitourinary: Negative for dysuria, hematuria and urgency.  Musculoskeletal: Negative for falls and joint pain.  Neurological: Positive for weakness (generalized). Negative for dizziness, tingling, sensory change, focal weakness, seizures and headaches.  Endo/Heme/Allergies: Negative for polydipsia. Does not bruise/bleed easily.  Psychiatric/Behavioral: Negative for depression and memory loss. The patient is not nervous/anxious.     MEDICATIONS AT HOME:   Prior to Admission medications   Medication Sig Start Date End Date Taking? Authorizing Provider  acetaminophen (TYLENOL) 500 MG tablet Take 1,000 mg by mouth every 4 (four) hours as needed for moderate pain.    Yes [provider]  albuterol (PROVENTIL HFA;VENTOLIN HFA) 108 (90 Base) MCG/ACT inhaler Inhale 2 puffs into the lungs every 4 (four) hours as needed for wheezing or shortness of breath.   Yes [provider]  aspirin 81 MG tablet Take 81 mg by mouth daily.    Yes [provider]  atorvastatin (LIPITOR) 80 MG tablet TAKE 1 TABLET BY MOUTH ONCE DAILY 12/21/16  Yes [provider]  cholecalciferol (VITAMIN D) 1000 units tablet Take 1,000 Units by mouth daily.   Yes  [provider]  dextromethorphan-guaiFENesin (MUCINEX DM) 30-600 MG 12hr tablet Take 1 tablet by mouth daily. TAKE 1 TABLET BY MOUTH ONCE DAILY FOR COUGHING.   Yes [provider]  DULoxetine (CYMBALTA) 60 MG capsule TAKE ONE CAPSULE BY MOUTH ONCE DAILY 02/03/17  Yes Elayne Snare, MD  Ferrous Sulfate (IRON) 325 (65 Fe) MG TABS Take 1 tablet (325 mg total) by mouth every other day. 02/03/18  Yes Merlyn Lot, MD  gabapentin (NEURONTIN) 600 MG tablet TAKE ONE TABLET BY MOUTH THREE TIMES DAILY 12/07/16  Yes Elayne Snare, MD  HYDROcodone-acetaminophen (NORCO) 10-325 MG tablet Take 1 tablet by mouth 2 (two) times daily as needed for moderate pain. Patient taking differently: Take 1 tablet by mouth every 12 (twelve) hours as needed for moderate pain.  12/01/17  Yes Elayne Snare,  MD  imiquimod (ALDARA) 5 % cream Apply topically 3 (three) times a week. Apply to right upper arm topically at bedtime every Tuesday, Thursday, ans Saturday   Yes [provider]  insulin NPH Human (NOVOLIN N RELION) 100 UNIT/ML injection Inject 40 units before breakfast and 10 units at bedtime Patient taking differently: Inject 6 Units into the skin at bedtime.  09/02/17  Yes Renato Shin, MD  insulin regular (NOVOLIN R RELION) 100 units/mL injection Use 10 units in the morning and 20 units at supper Patient taking differently: Inject 20 units in the morning, 10 units in the evening; hold for b/s less than 50 09/02/17  Yes Renato Shin, MD  LORazepam (ATIVAN) 1 MG tablet Take 1 mg by mouth as needed.   Yes [provider]  midodrine (PROAMATINE) 5 MG tablet Take 5 mg by mouth 2 (two) times daily with a meal. TAKE 1 TABLET BY MOUTH 2 TIMES DAILY. 02/03/18 02/03/19 Yes [provider]  montelukast (SINGULAIR) 10 MG tablet Take 10 mg by mouth daily.  03/02/11  Yes [provider]  sucralfate (CARAFATE) 1 GM/10ML suspension Take 1 g by mouth 4 (four) times daily -  before meals and at  bedtime. 07/13/18 07/21/18 Yes [provider]      VITAL SIGNS:  Blood pressure 125/65, pulse (!) 104, temperature (!) 95.9 F (35.5 C), temperature source Axillary, resp. rate 14, weight 75 kg, SpO2 92 %.  PHYSICAL EXAMINATION:  Physical Exam  GENERAL:  85 y.o.-year-old patient lying in bed in no acute distress.  EYES: Pupils equal, round, reactive to light and accommodation. No scleral icterus. Extraocular muscles intact.  HEENT: Head atraumatic, normocephalic. Oropharynx and nasopharynx clear. No oropharyngeal erythema, moist oral mucosa  NECK:  Supple, no jugular venous distention. No thyroid enlargement, no tenderness.  LUNGS: Normal breath sounds bilaterally, no wheezing, bibasilar rales, No rhonchi. No use of accessory muscles of respiration.  CARDIOVASCULAR: S1, S2 Irregular. No murmurs, rubs, gallops, clicks.  ABDOMEN: Soft, nontender, nondistended. Bowel sounds present. No organomegaly or mass.  EXTREMITIES: + 2 edema b/l, Mild acrocyanosis of the LE b/l, No clubbing. LE are cool to touch and dorsalis pedis pulse was no palpable.  NEUROLOGIC: Cranial nerves II through XII are intact. No focal Motor or sensory deficits appreciated b/l. Globally weak.  PSYCHIATRIC: The patient is alert and oriented x 3.  SKIN: No obvious rash, lesion, or ulcer.   LABORATORY PANEL:   CBC Recent Labs  Lab 07/19/18 1342  WBC 6.4  HGB 12.4  HCT 42.4  PLT 315   ------------------------------------------------------------------------------------------------------------------  Chemistries  Recent Labs  Lab 07/19/18 1342  NA 139  K 3.7  CL 110  CO2 20*  GLUCOSE 94  BUN 52*  CREATININE 1.98*  CALCIUM 8.6*   ------------------------------------------------------------------------------------------------------------------  Cardiac Enzymes Recent Labs  Lab 07/19/18 1342  TROPONINI 0.10*    ------------------------------------------------------------------------------------------------------------------  RADIOLOGY:  No results found.   IMPRESSION AND PLAN:   85 year old female with past medical history of breast cancer, hypertension, hyperlipidemia, diabetes, osteoarthritis, history of nephrolithiasis who presents to the hospital due to lower extremity edema and discoloration of her feet and cool lower extremities.  1.  Atrial fibrillation-this is new onset for the patient.  Patient denies any palpitations, chest pains and therefore is clinically asymptomatic. - We will start the patient on Toprol, follow heart rate. -Get cardiology consult.  Discussed the case with Dr. Nehemiah Massed.  Patient had a recent echocardiogram done at clinical clinic which showed severe  LV dysfunction EF of 25% therefore we will not repeated. - Patient has already been started on a heparin drip.  Patient can likely be switched over to an oral anticoagulant in the next 1 to 2 days.  2.  CHF- acute systolic dysfunction.  As mentioned patient had a previous echocardiogram last month showing EF of 25%.  This is the cause of patient's discoloration of the lower extremities and edema. - We will gently diurese the patient with IV Lasix, follow clinically. -Follow I's and O's and daily weights.  3.  Cool lower extremity/discoloration of her feet-initially thought to be secondary to vascular problem but seen by vascular surgery and patient had palpable pulses and they think this is likely due to underlying CHF.  We will continue to monitor with diuresis.  4.  Diabetes type 2 with neuropathy-continue NPH, sliding scale insulin.  Follow blood sugars.  Continue carb controlled diet.  5.  Diabetic neuropathy-continue gabapentin, Cymbalta.  6.  Asthma-no acute exacerbation.  Continue Singulair, albuterol inhaler as needed.    All the records are reviewed and case discussed with ED provider. Management plans  discussed with the patient, family and they are in agreement.  CODE STATUS: Full code  TOTAL TIME TAKING CARE OF THIS PATIENT: 45 minutes.    Henreitta Leber M.D on 07/19/2018 at 6:08 PM  Between 7am to 6pm - Pager - [phone]  After 6pm go to [email or web address] - password [email or web address] [facility]  [facility]  Office  [phone]  CC: Primary care physician; Kirk Ruths, MD

## 2018-07-19 NOTE — Consult Note (Signed)
ANTICOAGULATION CONSULT NOTE - Initial Consult  Pharmacy Consult for Heparin Indication: A Fib and Vascular insufficiency in legs  Allergies  Allergen Reactions  . Ciprofloxacin Diarrhea and Swelling  . Codeine Sulfate Itching  . Other     Other reaction(s): Other (See Comments) Mycins  . Sulfa Antibiotics     Other reaction(s): RASH    Patient Measurements: Heparin Dosing Weight: 72.4kg  Vital Signs: Temp: 95.9 F (35.5 C) (02/25 1331) Temp Source: Axillary (02/25 1331) Pulse Rate: 104 (02/25 1328)  Labs: Recent Labs    07/19/18 1342  HGB 12.4  HCT 42.4  PLT 315  CREATININE 1.98*  TROPONINI 0.10*    CrCl cannot be calculated (Unknown ideal weight.).   Medical History: Past Medical History:  Diagnosis Date  . Allergy   . Anemia   . Arthritis   . Asthma    stress related per pt  . Blood transfusion   . Breast cancer [facility]) 2011   Right  . Cancer ([place])    right breast  . Complication of anesthesia    diff  waking up  . Cough   . Diabetes mellitus   . Family history of adverse reaction to anesthesia    " SISTER HAS DIFFICULTY WAKING "  . Generalized headaches   . History of kidney stones   . Hyperlipidemia   . Lung nodules   . Nasal congestion   . Neuromuscular disorder ([place])    neuropathy feet/legs/hands    Medications:  No prior to admission anticoagulant  Assessment: Pharmacy has been consulted for heparin dosing for a. Fib with vascular insufficiency in legs  Goal of Therapy:  Heparin level 0.3-0.7 units/ml Monitor platelets by anticoagulation protocol: Yes   Plan:  Will give bolus of 3600 units, followed by 1000units/hr - will recheck HL @ 0200 2/26 (in 8 hours) per protocol  Lu Duffel, PharmD, BCPS Clinical Pharmacist 07/19/2018 4:46 PM

## 2018-07-19 NOTE — ED Triage Notes (Signed)
Pt from [facility] with c/o bilat foot pain and swelling x2days. Slight swelling and cool to touch noted bilat with acrocyanosis. NAD noted

## 2018-07-19 NOTE — Consult Note (Signed)
[facility] Vascular Consult Note  MRN : [id number]  Andrea Wallace is a 85 y.o. (January 29, 1932) female who presents with chief complaint of  Chief Complaint  Patient presents with  . Leg Pain   History of Present Illness:  The patient is an 85 year old female with a past medical history of breast cancer now with a lung mass, diabetes, lumbar stenosis, anemia, type 2 diabetes, chronic kidney disease stage III, carotid artery disease, hypertension, hyperlipidemia, chronic mesenteric ischemia, coronary artery disease and peripheral artery disease who presented to the [facility] ED with a chief complaint of "foot pain and swelling".  The patient was seen and examined with a family friend at the bedside.  The patient notes some intermittent foot pain over the last 2 days.  States the pain does not worsen with ambulation.  She denies any other type of claudication-like symptoms.  She states that this is the first time her legs ever "swelled this much".  The family member present states she has been recently diagnosed with congestive heart failure.  The patient seems to be experiencing what she describes of as "weakness" to the lower extremity more than pain.  Patient denies any loss of motor/sensory functioning to the bilateral feet.  Patient denies any chest pain or shortness of breath.  Patient denies any fever, nausea vomiting.  Vascular surgery was consulted by Dr. Burlene Arnt for possible ischemia. Current Facility-Administered Medications  Medication Dose Route Frequency Provider Last Rate Last Dose  . heparin bolus via infusion 3,600 Units  3,600 Units Intravenous Once Shanlever, Pierce Crane, RPH       Followed by  . heparin ADULT infusion 100 units/mL (25000 units/258mL sodium chloride 0.45%)  1,000 Units/hr Intravenous Continuous Shanlever, Pierce Crane, [facility]       Current Outpatient Medications  Medication Sig Dispense Refill  . acetaminophen (TYLENOL)  500 MG tablet Take 1,000 mg by mouth every 4 (four) hours as needed for moderate pain.     Marland Kitchen albuterol (PROVENTIL HFA;VENTOLIN HFA) 108 (90 Base) MCG/ACT inhaler Inhale 2 puffs into the lungs every 4 (four) hours as needed for wheezing or shortness of breath.    Marland Kitchen aspirin 81 MG tablet Take 81 mg by mouth daily.     Marland Kitchen atorvastatin (LIPITOR) 80 MG tablet TAKE 1 TABLET BY MOUTH ONCE DAILY    . cholecalciferol (VITAMIN D) 1000 units tablet Take 1,000 Units by mouth daily.    Marland Kitchen dextromethorphan-guaiFENesin (MUCINEX DM) 30-600 MG 12hr tablet Take 1 tablet by mouth daily. TAKE 1 TABLET BY MOUTH ONCE DAILY FOR COUGHING.    . DULoxetine (CYMBALTA) 60 MG capsule TAKE ONE CAPSULE BY MOUTH ONCE DAILY 30 capsule 2  . Ferrous Sulfate (IRON) 325 (65 Fe) MG TABS Take 1 tablet (325 mg total) by mouth every other day. 30 each 1  . gabapentin (NEURONTIN) 600 MG tablet TAKE ONE TABLET BY MOUTH THREE TIMES DAILY 90 tablet 5  . HYDROcodone-acetaminophen (NORCO) 10-325 MG tablet Take 1 tablet by mouth 2 (two) times daily as needed for moderate pain. (Patient taking differently: Take 1 tablet by mouth every 12 (twelve) hours as needed for moderate pain. ) 100 tablet 0  . imiquimod (ALDARA) 5 % cream Apply topically 3 (three) times a week. Apply to right upper arm topically at bedtime every Tuesday, Thursday, ans Saturday    . insulin NPH Human (NOVOLIN N RELION) 100 UNIT/ML injection Inject 40 units before breakfast and 10 units at bedtime (Patient taking  differently: Inject 6 Units into the skin at bedtime. ) 20 mL 3  . insulin regular (NOVOLIN R RELION) 100 units/mL injection Use 10 units in the morning and 20 units at supper (Patient taking differently: Inject 20 units in the morning, 10 units in the evening; hold for b/s less than 50) 20 mL 3  . LORazepam (ATIVAN) 1 MG tablet Take 1 mg by mouth as needed.    . midodrine (PROAMATINE) 5 MG tablet Take 5 mg by mouth 2 (two) times daily with a meal. TAKE 1 TABLET BY MOUTH 2  TIMES DAILY.    . montelukast (SINGULAIR) 10 MG tablet Take 10 mg by mouth daily.     . sucralfate (CARAFATE) 1 GM/10ML suspension Take 1 g by mouth 4 (four) times daily -  before meals and at bedtime.     Past Medical History:  Diagnosis Date  . Allergy   . Anemia   . Arthritis   . Asthma    stress related per pt  . Blood transfusion   . Breast cancer [facility]) 2011   Right  . Cancer ([place])    right breast  . Complication of anesthesia    diff  waking up  . Cough   . Diabetes mellitus   . Family history of adverse reaction to anesthesia    " SISTER HAS DIFFICULTY WAKING "  . Generalized headaches   . History of kidney stones   . Hyperlipidemia   . Lung nodules   . Nasal congestion   . Neuromuscular disorder ([place])    neuropathy feet/legs/hands   Past Surgical History:  Procedure Laterality Date  . BACK SURGERY    . BREAST SURGERY  06/2007   right mastectomy  . BREAST SURGERY  2011   left  . FOOT SURGERY    . KIDNEY STONE SURGERY    . LITHOTRIPSY    . MASTECTOMY Right 2011   with Radiaiton  . ORIF ELBOW FRACTURE Right 10/28/2016   Procedure: OPEN REDUCTION INTERNAL FIXATION (ORIF) ELBOW/OLECRANON FRACTURE;  Surgeon: Dorna Leitz, MD;  Location: [place];  Service: Orthopedics;  Laterality: Right;  . REDUCTION MAMMAPLASTY Left 2012  . SHOULDER ARTHROSCOPY     bilateral  . TOTAL KNEE ARTHROPLASTY    . VISCERAL ANGIOGRAPHY N/A 08/06/2016   Procedure: Visceral Angiography;  Surgeon: Algernon Huxley, MD;  Location: [place] [facility];  Service: Cardiovascular;  Laterality: N/A;  . VISCERAL ARTERY INTERVENTION N/A 08/06/2016   Procedure: Visceral Artery Intervention;  Surgeon: Algernon Huxley, MD;  Location: [place] [facility];  Service: Cardiovascular;  Laterality: N/A;   Social History Social History   Tobacco Use  . Smoking status: Former Smoker    Last attempt to quit: 03/15/1984    Years since quitting: 34.3  . Smokeless tobacco: Never Used  Substance Use Topics  . Alcohol  use: Yes    Comment: occ  . Drug use: No   Family History Family History  Problem Relation Age of Onset  . Osteoporosis Mother   . Heart disease Father   . Cancer Brother   . Breast cancer Brother   . Heart disease Sister   Denies a family history of peripheral artery disease, venous disease or bleeding/clotting disorders  Allergies  Allergen Reactions  . Ciprofloxacin Diarrhea and Swelling  . Codeine Sulfate Itching  . Other     Other reaction(s): Other (See Comments) Mycins  . Sulfa Antibiotics     Other reaction(s): RASH  REVIEW OF SYSTEMS (Negative unless checked)  Constitutional: [] Weight loss  [] Fever  [] Chills Cardiac: [] Chest pain   [] Chest pressure   [] Palpitations   [] Shortness of breath when laying flat   [] Shortness of breath at rest   [] Shortness of breath with exertion. Vascular:  [x] Pain in legs with walking   [x] Pain in legs at rest   [x] Pain in legs when laying flat   [] Claudication   [x] Pain in feet when walking  [x] Pain in feet at rest  [x] Pain in feet when laying flat   [] History of DVT   [] Phlebitis   [x] Swelling in legs   [] Varicose veins   [] Non-healing ulcers Pulmonary:   [] Uses home oxygen   [] Productive cough   [] Hemoptysis   [] Wheeze  [] COPD   [] Asthma Neurologic:  [] Dizziness  [] Blackouts   [] Seizures   [] History of stroke   [] History of TIA  [] Aphasia   [] Temporary blindness   [] Dysphagia   [] Weakness or numbness in arms   [] Weakness or numbness in legs Musculoskeletal:  [] Arthritis   [] Joint swelling   [] Joint pain   [] Low back pain Hematologic:  [] Easy bruising  [] Easy bleeding   [] Hypercoagulable state   [x] Anemic  [] Hepatitis Gastrointestinal:  [] Blood in stool   [] Vomiting blood  [] Gastroesophageal reflux/heartburn   [] Difficulty swallowing. Genitourinary:  [x] Chronic kidney disease   [] Difficult urination  [] Frequent urination  [] Burning with urination   [] Blood in urine Skin:  [] Rashes   [] Ulcers   [] Wounds Psychological:  [] History of anxiety    []  History of major depression.  Physical Examination  Vitals:   07/19/18 1328 07/19/18 1331 07/19/18 1337 07/19/18 1652  Pulse: (!) 104     Resp: 16 18    Temp:  (!) 95.9 F (35.5 C)    TempSrc:  Axillary    SpO2:   92%   Weight:    75 kg   Body mass index is 27.51 kg/m. Gen:  WD/WN, NAD Head: [place]/AT, No temporalis wasting. Prominent temp pulse not noted. Ear/Nose/Throat: Hearing grossly intact, nares w/o erythema or drainage, oropharynx w/o Erythema/Exudate Eyes: Sclera non-icteric, conjunctiva clear Neck: Trachea midline.  No JVD.  Pulmonary:  Good air movement, respirations not labored, equal bilaterally.  Cardiac: nNormal S1, S2. Vascular:  Vessel Right Left  Radial Palpable Palpable  Ulnar Palpable Palpable  Brachial Palpable Palpable  Carotid Palpable, without bruit Palpable, without bruit  Aorta Not palpable N/A  Femoral Palpable Palpable  Popliteal Palpable Palpable  PT Palpable Palpable  DP Palpable Palpable   Right lower extremity: Thigh soft. Calf soft. The extremity is warm distally until about approximately midfoot where it turns cooler.  Toes have a bluish tint to it.  There is a palpable popliteal, dorsalis pedis and posterior tibialis pulse.  The patient has at least 2-3+ pitting edema.  Motor/sensory is intact.  Right lower extremity: Thigh soft. Calf soft. The extremity is warm distally until about approximately midfoot where it turns cooler.  Toes have a bluish tint to it.  There is a palpable popliteal, dorsalis pedis and posterior tibialis pulse.  The patient has at least 2-3+ pitting edema.  Motor/sensory is intact.  Gastrointestinal: soft, non-tender/non-distended. No guarding/reflex.  Musculoskeletal: M/S 5/5 throughout.  Extremities without ischemic changes.  No deformity or atrophy.  Neurologic: Sensation grossly intact in extremities.  Symmetrical.  Speech is fluent. Motor exam as listed above. Psychiatric: Judgment intact, Mood & affect  appropriate for pt's clinical situation. Dermatologic: No rashes or ulcers noted.  No cellulitis or open  wounds. Lymph : No Cervical, Axillary, or Inguinal lymphadenopathy.  CBC Lab Results  Component Value Date   WBC 6.4 07/19/2018   HGB 12.4 07/19/2018   HCT 42.4 07/19/2018   MCV 92.4 07/19/2018   PLT 315 07/19/2018   BMET    Component Value Date/Time   NA 139 07/19/2018 1342   NA 137 11/12/2009 1141   K 3.7 07/19/2018 1342   K 4.0 11/12/2009 1141   CL 110 07/19/2018 1342   CL 98 11/12/2009 1141   CO2 20 (L) 07/19/2018 1342   CO2 30 11/12/2009 1141   GLUCOSE 94 07/19/2018 1342   GLUCOSE 70 (L) 11/12/2009 1141   BUN 52 (H) 07/19/2018 1342   BUN 25 (H) 11/12/2009 1141   CREATININE 1.98 (H) 07/19/2018 1342   CREATININE 1.37 (H) 07/04/2013 1222   CALCIUM 8.6 (L) 07/19/2018 1342   CALCIUM 9.7 11/12/2009 1141   GFRNONAA 23 (L) 07/19/2018 1342   GFRAA 26 (L) 07/19/2018 1342   Estimated Creatinine Clearance: 21.4 mL/min (A) (by C-G formula based on SCr of 1.98 mg/dL (H)).  COAG No results found for: INR, PROTIME  Radiology No results found.  Assessment/Plan The patient is an 85 year old female with a past medical history of breast cancer now with a lung mass, diabetes, lumbar stenosis, anemia, type 2 diabetes, chronic kidney disease stage III, carotid artery disease, hypertension, hyperlipidemia, chronic mesenteric ischemia, coronary artery disease and peripheral artery disease who presented to the [facility] ED with a chief complaint of "foot pain and swelling". 1. PAD: The patient has multiple risk factors for peripheral artery disease however at this time I can palpate a popliteal, posterior tibial and dorsalis pedis pulse to the bilateral legs.  I do not feel there is an acute vascular compromise to the bilateral legs.  The patient's progressively worsening edema and discoloration to the bilateral toes may be multifactorial including cardiac and/or  kidney function as a contributing factor. Would recommend admission to the medical service if they agree for further workup.  This may also be paraneoplastic syndrome with her past medical history of breast cancer and newly found mass which was being followed by Dr. Grayland Ormond. We will continues to follow along and can always schedule a diagnostic lower extremity angiogram in the future.  2. CHF: The patient's family member at the bedside notes this is a recent diagnosis and could also be a contributing factor to her lower extremity perfusion and edema. 3.  [place] kidney disease: This is also contributing factor to the patient's lower extremity edema  Discussed with Dr. Francene Castle, PA-C  07/19/2018 5:00 PM  This note was created with Dragon medical transcription system.  Any error is purely unintentional.

## 2018-07-20 ENCOUNTER — Other Ambulatory Visit: Payer: Self-pay

## 2018-07-20 ENCOUNTER — Encounter: Payer: Self-pay | Admitting: *Deleted

## 2018-07-20 LAB — BASIC METABOLIC PANEL
Anion gap: 12 (ref 5–15)
BUN: 55 mg/dL — ABNORMAL HIGH (ref 8–23)
CALCIUM: 8.4 mg/dL — AB (ref 8.9–10.3)
CO2: 17 mmol/L — ABNORMAL LOW (ref 22–32)
Chloride: 108 mmol/L (ref 98–111)
Creatinine, Ser: 2.05 mg/dL — ABNORMAL HIGH (ref 0.44–1.00)
GFR calc Af Amer: 25 mL/min — ABNORMAL LOW (ref >60)
GFR, EST NON AFRICAN AMERICAN: 22 mL/min — AB (ref >60)
Glucose, Bld: 144 mg/dL — ABNORMAL HIGH (ref 70–99)
Potassium: 3.8 mmol/L (ref 3.5–5.1)
Sodium: 137 mmol/L (ref 135–145)

## 2018-07-20 LAB — CBC
HCT: 45.6 % (ref 36.0–46.0)
Hemoglobin: 12.8 g/dL (ref 12.0–15.0)
MCH: 27.1 pg (ref 26.0–34.0)
MCHC: 28.1 g/dL — ABNORMAL LOW (ref 30.0–36.0)
MCV: 96.6 fL (ref 80.0–100.0)
PLATELETS: 300 10*3/uL (ref 150–400)
RBC: 4.72 MIL/uL (ref 3.87–5.11)
RDW: 16.8 % — AB (ref 11.5–15.5)
WBC: 7.5 10*3/uL (ref 4.0–10.5)
nRBC: 0.4 % — ABNORMAL HIGH (ref 0.0–0.2)

## 2018-07-20 LAB — TROPONIN I
TROPONIN I: 0.09 ng/mL — AB (ref <0.03)
Troponin I: 0.09 ng/mL (ref <0.03)
Troponin I: 0.09 ng/mL (ref <0.03)

## 2018-07-20 LAB — GLUCOSE, CAPILLARY
Glucose-Capillary: 141 mg/dL — ABNORMAL HIGH (ref 70–99)
Glucose-Capillary: 142 mg/dL — ABNORMAL HIGH (ref 70–99)
Glucose-Capillary: 137 mg/dL — ABNORMAL HIGH (ref 70–99)
Glucose-Capillary: 195 mg/dL — ABNORMAL HIGH (ref 70–99)
Glucose-Capillary: 197 mg/dL — ABNORMAL HIGH (ref 70–99)
Glucose-Capillary: 203 mg/dL — ABNORMAL HIGH (ref 70–99)

## 2018-07-20 LAB — MRSA PCR SCREENING: MRSA by PCR: POSITIVE — AB

## 2018-07-20 LAB — HEPARIN LEVEL (UNFRACTIONATED)
Heparin Unfractionated: >3.6 IU/mL — ABNORMAL HIGH (ref 0.30–0.70)
Heparin Unfractionated: 0.74 IU/mL — ABNORMAL HIGH (ref 0.30–0.70)

## 2018-07-20 MED ORDER — MUPIROCIN 2 % EX OINT
1.0000 "application " | TOPICAL_OINTMENT | Freq: Two times a day (BID) | CUTANEOUS | Status: DC
  Administered 2018-07-20 – 2018-07-21 (×3): 1 via NASAL
  Filled 2018-07-20: qty 22

## 2018-07-20 MED ORDER — CHLORHEXIDINE GLUCONATE CLOTH 2 % EX PADS
6.0000 | MEDICATED_PAD | Freq: Every day | CUTANEOUS | Status: DC
  Administered 2018-07-20: 6 via TOPICAL

## 2018-07-20 MED ORDER — ATORVASTATIN CALCIUM 20 MG PO TABS
80.0000 mg | ORAL_TABLET | Freq: Every day | ORAL | Status: DC
  Administered 2018-07-20 – 2018-07-21 (×3): 80 mg via ORAL
  Filled 2018-07-20 (×3): qty 4

## 2018-07-20 MED ORDER — APIXABAN 2.5 MG PO TABS
2.5000 mg | ORAL_TABLET | Freq: Two times a day (BID) | ORAL | Status: DC
  Administered 2018-07-20 – 2018-07-21 (×3): 2.5 mg via ORAL
  Filled 2018-07-20 (×3): qty 1

## 2018-07-20 MED ORDER — SODIUM CHLORIDE 0.9% FLUSH
3.0000 mL | Freq: Two times a day (BID) | INTRAVENOUS | Status: DC
  Administered 2018-07-20 – 2018-07-21 (×3): 3 mL via INTRAVENOUS

## 2018-07-20 MED ORDER — SODIUM CHLORIDE 0.9% FLUSH
3.0000 mL | INTRAVENOUS | Status: DC | PRN
  Administered 2018-07-20: 3 mL via INTRAVENOUS
  Filled 2018-07-20: qty 3

## 2018-07-20 NOTE — Plan of Care (Signed)
  Problem: Clinical Measurements: Goal: Respiratory complications will improve Outcome: Progressing Note:  Room air   Problem: Activity: Goal: Risk for activity intolerance will decrease Outcome: Progressing Note:  Walked from stretcher to bed with 2-3 assist, pt very unsteady on feet   Problem: Pain Managment: Goal: General experience of comfort will improve Outcome: Progressing Note:  Complaints of foot pain treated with tylenol with relief   Problem: Safety: Goal: Ability to remain free from injury will improve Outcome: Progressing

## 2018-07-20 NOTE — Clinical Social Work Note (Addendum)
CSW received consult that patient is from [facility], CSW attempted to contact Lenna Sciara [phone] the ALF nurse, had to leave a message on voice mail awaiting for call back, assessment to be completed at a later time.  Jones Broom. Norval Morton, MSW, [place]  07/20/2018 5:28 PM

## 2018-07-20 NOTE — Consult Note (Signed)
ANTICOAGULATION CONSULT NOTE - Initial Consult  Pharmacy Consult for Heparin Indication: A Fib and Vascular insufficiency in legs  Allergies  Allergen Reactions  . Ciprofloxacin Diarrhea and Swelling  . Codeine Sulfate Itching  . Other     Other reaction(s): Other (See Comments) Mycins  . Sulfa Antibiotics     Other reaction(s): RASH    Patient Measurements: Heparin Dosing Weight: 72.4kg  Vital Signs: Temp: 97.3 F (36.3 C) (02/26 0830) Temp Source: Oral (02/26 0830) BP: 78/48 (02/26 0830) Pulse Rate: 85 (02/26 0830)  Labs: Recent Labs    07/19/18 1342 07/19/18 1630 07/20/18 0100 07/20/18 0102 07/20/18 0429 07/20/18 0743 07/20/18 0815  HGB 12.4  --   --   --  12.8  --   --   HCT 42.4  --   --   --  45.6  --   --   PLT 315  --   --   --  300  --   --   APTT  --  45*  --   --   --   --   --   LABPROT  --  14.2  --   --   --   --   --   INR  --  1.1  --   --   --   --   --   HEPARINUNFRC  --   --   --  >3.60*  --   --  0.74*  CREATININE 1.98*  --   --   --  2.05*  --   --   TROPONINI 0.10*  --  0.09*  --  0.09* 0.09*  --     Estimated Creatinine Clearance: 20.7 mL/min (A) (by C-G formula based on SCr of 2.05 mg/dL (H)).   Medical History: Past Medical History:  Diagnosis Date  . Allergy   . Anemia   . Arthritis   . Asthma    stress related per pt  . Blood transfusion   . Breast cancer [facility]) 2011   Right  . Cancer ([place])    right breast  . Complication of anesthesia    diff  waking up  . Cough   . Diabetes mellitus   . Family history of adverse reaction to anesthesia    " SISTER HAS DIFFICULTY WAKING "  . Generalized headaches   . History of kidney stones   . Hyperlipidemia   . Lung nodules   . Nasal congestion   . Neuromuscular disorder ([place])    neuropathy feet/legs/hands    Medications:  No prior to admission anticoagulant  Assessment: Pharmacy has been consulted for heparin dosing for a. Fib with vascular insufficiency in legs. Give  bolus of 3600 units, followed by 1000units/hr.  2/26 0102 HL >3.60 - no change was made  2/25 0815 HL > 0.74 - decrease rate by 50 units.   Goal of Therapy:  Heparin level 0.3-0.7 units/ml Monitor platelets by anticoagulation protocol: Yes   Plan:  Heparin slightly supratherapeutic . Will decrease rate to 950 units/hr. Will recheck heparin level in 8 hours.   Oswald Hillock, PharmD, BCPS Clinical Pharmacist 07/20/2018 9:02 AM

## 2018-07-20 NOTE — Plan of Care (Signed)
Breathing comfortably on room air

## 2018-07-20 NOTE — Progress Notes (Signed)
[name] [place] NAME: Andrea Wallace    MR#:  [id number]  DATE OF BIRTH:  August 28, 1931  SUBJECTIVE:  CHIEF COMPLAINT:   Chief Complaint  Patient presents with  . Leg Pain  Seen and evaluated today No complaints of any palpitations No complaints of chest pain Decreased pain in the lower extremity  REVIEW OF SYSTEMS:    ROS  CONSTITUTIONAL: No documented fever. No fatigue, weakness. No weight gain, no weight loss.  EYES: No blurry or double vision.  ENT: No tinnitus. No postnasal drip. No redness of the oropharynx.  RESPIRATORY: No cough, no wheeze, no hemoptysis. No dyspnea.  CARDIOVASCULAR: No chest pain. No orthopnea. No palpitations. No syncope.  GASTROINTESTINAL: No nausea, no vomiting or diarrhea. No abdominal pain. No melena or hematochezia.  GENITOURINARY: No dysuria or hematuria.  ENDOCRINE: No polyuria or nocturia. No heat or cold intolerance.  HEMATOLOGY: No anemia. No bruising. No bleeding.  INTEGUMENTARY: No rashes. No lesions.  MUSCULOSKELETAL: No arthritis. No swelling. No gout.  NEUROLOGIC: No numbness, tingling, or ataxia. No seizure-type activity.  PSYCHIATRIC: No anxiety. No insomnia. No ADD.   DRUG ALLERGIES:   Allergies  Allergen Reactions  . Ciprofloxacin Diarrhea and Swelling  . Codeine Sulfate Itching  . Other     Other reaction(s): Other (See Comments) Mycins  . Sulfa Antibiotics     Other reaction(s): RASH    VITALS:  Blood pressure (!) 101/59, pulse 75, temperature (!) 97.3 F (36.3 C), temperature source Oral, resp. rate 16, height 5\' 5"  (1.651 m), weight 75.1 kg, SpO2 96 %.  PHYSICAL EXAMINATION:   Physical Exam  GENERAL:  85 y.o.-year-old patient lying in the bed with no acute distress.  EYES: Pupils equal, round, reactive to light and accommodation. No scleral icterus. Extraocular muscles intact.  HEENT: Head atraumatic, normocephalic. Oropharynx and nasopharynx clear.  NECK:  Supple, no  jugular venous distention. No thyroid enlargement, no tenderness.  LUNGS: Normal breath sounds bilaterally, no wheezing, rales, rhonchi. No use of accessory muscles of respiration.  CARDIOVASCULAR: S1, S2 irregular. No murmurs, rubs, or gallops.  ABDOMEN: Soft, nontender, nondistended. Bowel sounds present. No organomegaly or mass.  EXTREMITIES: No cyanosis, clubbing or edema b/l.   Pulses present NEUROLOGIC: Cranial nerves II through XII are intact. No focal Motor or sensory deficits b/l.   PSYCHIATRIC: The patient is alert and oriented x 3.  SKIN: No obvious rash, lesion, or ulcer.   LABORATORY PANEL:   CBC Recent Labs  Lab 07/20/18 0429  WBC 7.5  HGB 12.8  HCT 45.6  PLT 300   ------------------------------------------------------------------------------------------------------------------ Chemistries  Recent Labs  Lab 07/20/18 0429  NA 137  K 3.8  CL 108  CO2 17*  GLUCOSE 144*  BUN 55*  CREATININE 2.05*  CALCIUM 8.4*   ------------------------------------------------------------------------------------------------------------------  Cardiac Enzymes Recent Labs  Lab 07/20/18 0743  TROPONINI 0.09*   ------------------------------------------------------------------------------------------------------------------  RADIOLOGY:  Dg Chest Port 1 View  Result Date: 07/19/2018 CLINICAL DATA:  85 year old female with a history of bilateral foot pain and swelling EXAM: PORTABLE CHEST 1 VIEW COMPARISON:  02/04/2018, chest CT 05/10/2018 FINDINGS: Cardiomediastinal silhouette unchanged in size and contour with cardiomegaly. Coarsened interstitial markings throughout the lungs. No evidence of confluent airspace disease. No pleural effusion or pneumothorax. No displaced fracture. IMPRESSION: Chronic lung changes without evidence of acute cardiopulmonary disease Electronically Signed   By: Corrie Mckusick D.O.   On: 07/19/2018 18:26     ASSESSMENT AND PLAN:  85 year old female  patient with history of breast cancer, hypertension, hyperlipidemia, type 2 diabetes mellitus, osteoarthritis currently under hospitalist service for cooler lower extremities and discoloration of her feet along with irregular heart rhythm  -Atrial fibrillation new onset Continue oral beta-blocker for rate control Status post cardiology evaluation Recent echo showed ejection fraction of 25% with LV dysfunction We will transition patient from heparin drip for anticoagulation to oral Eliquis as per cardiology  -Peripheral vascular disease Status post vascular surgery evaluation Lower extremity angiogram for diagnostic purposes will be in the future Medical management for heart failure to continue which is the main cause of peripheral vascular disease poor circulation and perfusion the lower extremity  -Acute systolic heart failure exacerbation Recent echo shows EF of 25% Heart failure exacerbation could be responsible for lower extremity edema and poor circulation Diuresis with IV Lasix as blood pressure permits Input output chart and body weights  -Type 2 diabetes mellitus Diabetic diet along with sliding scale coverage with insulin  All the records are reviewed and case discussed with Care Management/Social Worker. Management plans discussed with the patient, family and they are in agreement.  CODE STATUS: Full code  DVT Prophylaxis: SCDs  TOTAL TIME TAKING CARE OF THIS PATIENT: 36 minutes.   POSSIBLE D/C IN 2 to 3 DAYS, DEPENDING ON CLINICAL CONDITION.  Saundra Shelling M.D on 07/20/2018 at 1:34 PM  Between 7am to 6pm - Pager - [phone]  After 6pm go to [email or web address] - password [email or web address] [facility]  Office  [phone]  CC: Primary care physician; Kirk Ruths, MD  Note: This dictation was prepared with Dragon dictation along with smaller phrase technology. Any transcriptional errors that result from this process are unintentional.

## 2018-07-20 NOTE — Progress Notes (Signed)
BP is 78/48.  Midodrine 5 mg. Given.  Dr. Estanislado Pandy contacted.

## 2018-07-20 NOTE — Progress Notes (Signed)
[facility] Encounter Note  Patient: Andrea Wallace / Admit Date: 07/19/2018 / Date of Encounter: 07/20/2018, 9:04 AM   Subjective: Patient overall improved since yesterday with better heart rate control of atrial fibrillation but remaining in atrial fibrillation by telemetry.  Patient slightly improved with lower extremity edema and shortness of breath with less symptoms.  Troponin level minimally elevated most consistent with demand ischemia rather than acute coronary syndrome Outpatient echocardiogram showing global LV systolic dysfunction with ejection fraction of 25%  Review of Systems: Positive for: Shortness of breath Negative for: Vision change, hearing change, syncope, dizziness, nausea, vomiting,diarrhea, bloody stool, stomach pain, cough, congestion, diaphoresis, urinary frequency, urinary pain,skin lesions, skin rashes Others previously listed  Objective: Telemetry: Atrial fibrillation with controlled ventricular rate Physical Exam: Blood pressure (!) 78/48, pulse 85, temperature (!) 97.3 F (36.3 C), temperature source Oral, resp. rate 16, height 5\' 5"  (1.651 m), weight 75.1 kg, SpO2 100 %. Body mass index is 27.56 kg/m. General: Well developed, well nourished, in no acute distress. Head: Normocephalic, atraumatic, sclera non-icteric, no xanthomas, nares are without discharge. Neck: No apparent masses Lungs: Normal respirations with no wheezes, no rhonchi, basilar rales , few to crackles   Heart: Irregular rate and rhythm, normal S1 S2, no murmur, no rub, no gallop, PMI is normal size and placement, carotid upstroke normal without bruit, jugular venous pressure normal Abdomen: Soft, non-tender, non-distended with normoactive bowel sounds. No hepatosplenomegaly. Abdominal aorta is normal size without bruit Extremities 1+ edema, no clubbing, no cyanosis, no ulcers,  Peripheral: 2+ radial, 2+ femoral, 2+ dorsal pedal pulses Neuro: Alert and oriented. Moves  all extremities spontaneously. Psych:  Responds to questions appropriately with a normal affect.   Intake/Output Summary (Last 24 hours) at 07/20/2018 0904 Last data filed at 07/20/2018 0402 Gross per 24 hour  Intake 136.18 ml  Output -  Net 136.18 ml    Inpatient Medications:  . aspirin EC  81 mg Oral Daily  . atorvastatin  80 mg Oral Daily  . Chlorhexidine Gluconate Cloth  6 each Topical Q0600  . cholecalciferol  1,000 Units Oral Daily  . DULoxetine  60 mg Oral Daily  . [START ON 07/21/2018] ferrous sulfate  325 mg Oral QODAY  . furosemide  20 mg Intravenous Q12H  . gabapentin  600 mg Oral TID  . insulin aspart  0-5 Units Subcutaneous QHS  . insulin aspart  0-9 Units Subcutaneous TID WC  . insulin NPH Human  6 Units Subcutaneous QHS  . metoprolol succinate  25 mg Oral Daily  . midodrine  5 mg Oral BID WC  . montelukast  10 mg Oral Daily  . mupirocin ointment  1 application Nasal BID  . sucralfate  1 g Oral TID AC & HS   Infusions:  . heparin 1,000 Units/hr (07/20/18 0402)    Labs: Recent Labs    07/19/18 1342 07/20/18 0429  NA 139 137  K 3.7 3.8  CL 110 108  CO2 20* 17*  GLUCOSE 94 144*  BUN 52* 55*  CREATININE 1.98* 2.05*  CALCIUM 8.6* 8.4*   No results for input(s): AST, ALT, ALKPHOS, BILITOT, PROT, ALBUMIN in the last 72 hours. Recent Labs    07/19/18 1342 07/20/18 0429  WBC 6.4 7.5  HGB 12.4 12.8  HCT 42.4 45.6  MCV 92.4 96.6  PLT 315 300   Recent Labs    07/19/18 1342 07/20/18 0100 07/20/18 0429 07/20/18 0743  TROPONINI 0.10* 0.09* 0.09* 0.09*   Invalid  input(s): POCBNP No results for input(s): HGBA1C in the last 72 hours.   Weights: Filed Weights   07/19/18 1652 07/20/18 0343  Weight: 75 kg 75.1 kg     Radiology/Studies:  Dg Chest Port 1 View  Result Date: 07/19/2018 CLINICAL DATA:  85 year old female with a history of bilateral foot pain and swelling EXAM: PORTABLE CHEST 1 VIEW COMPARISON:  02/04/2018, chest CT 05/10/2018 FINDINGS:  Cardiomediastinal silhouette unchanged in size and contour with cardiomegaly. Coarsened interstitial markings throughout the lungs. No evidence of confluent airspace disease. No pleural effusion or pneumothorax. No displaced fracture. IMPRESSION: Chronic lung changes without evidence of acute cardiopulmonary disease Electronically Signed   By: Corrie Mckusick D.O.   On: 07/19/2018 18:26     Assessment and Recommendation  85 y.o. female with acute systolic dysfunction congestive heart failure with lower extremity and pulmonary edema chronic kidney disease stage IV with new onset atrial fibrillation with rapid ventricular rate now better controlled with appropriate medication management 1.  Continue intravenous Lasix for 24 more hours and switch to oral after improvements of congestive heart failure 2.  Continue metoprolol at current dose and increase if able to 50 mg for better heart rate control of atrial fibrillation with a goal heart rate between 60 and 90 bpm at rest 3.  Anticoagulation with heparin and changing over to Eliquis for further risk reduction in stroke with atrial fibrillation 4.  Further consideration of ACE inhibitor although chronic kidney disease may be inhibitory 5.  Further diagnostic testing and treatment options after above  Signed, Serafina Royals M.D. FACC

## 2018-07-20 NOTE — Progress Notes (Signed)
ANTICOAGULATION CONSULT NOTE - Initial Consult  Pharmacy Consult for apixaban  Indication: atrial fibrillation  Allergies  Allergen Reactions  . Ciprofloxacin Diarrhea and Swelling  . Codeine Sulfate Itching  . Other     Other reaction(s): Other (See Comments) Mycins  . Sulfa Antibiotics     Other reaction(s): RASH    Patient Measurements: Height: 5\' 5"  (165.1 cm) Weight: 165 lb 9.6 oz (75.1 kg) IBW/kg (Calculated) : 57   Vital Signs: Temp: 97.3 F (36.3 C) (02/26 0830) Temp Source: Oral (02/26 0830) BP: 107/78 (02/26 0941) Pulse Rate: 72 (02/26 0941)  Labs: Recent Labs    07/19/18 1342 07/19/18 1630 07/20/18 0100 07/20/18 0102 07/20/18 0429 07/20/18 0743 07/20/18 0815  HGB 12.4  --   --   --  12.8  --   --   HCT 42.4  --   --   --  45.6  --   --   PLT 315  --   --   --  300  --   --   APTT  --  45*  --   --   --   --   --   LABPROT  --  14.2  --   --   --   --   --   INR  --  1.1  --   --   --   --   --   HEPARINUNFRC  --   --   --  >3.60*  --   --  0.74*  CREATININE 1.98*  --   --   --  2.05*  --   --   TROPONINI 0.10*  --  0.09*  --  0.09* 0.09*  --     Estimated Creatinine Clearance: 20.7 mL/min (A) (by C-G formula based on SCr of 2.05 mg/dL (H)).   Medical History: Past Medical History:  Diagnosis Date  . Allergy   . Anemia   . Arthritis   . Asthma    stress related per pt  . Blood transfusion   . Breast cancer [facility]) 2011   Right  . Cancer ([place])    right breast  . Complication of anesthesia    diff  waking up  . Cough   . Diabetes mellitus   . Family history of adverse reaction to anesthesia    " SISTER HAS DIFFICULTY WAKING "  . Generalized headaches   . History of kidney stones   . Hyperlipidemia   . Lung nodules   . Nasal congestion   . Neuromuscular disorder ([place])    neuropathy feet/legs/hands    Medications:  Medications Prior to Admission  Medication Sig Dispense Refill Last Dose  . acetaminophen (TYLENOL) 500 MG tablet  Take 1,000 mg by mouth every 4 (four) hours as needed for moderate pain.    prn at prn  . albuterol (PROVENTIL HFA;VENTOLIN HFA) 108 (90 Base) MCG/ACT inhaler Inhale 2 puffs into the lungs every 4 (four) hours as needed for wheezing or shortness of breath.   prn at prn  . aspirin 81 MG tablet Take 81 mg by mouth daily.    07/19/2018 at 0800  . atorvastatin (LIPITOR) 80 MG tablet TAKE 1 TABLET BY MOUTH ONCE DAILY   07/19/2018 at 0800  . cholecalciferol (VITAMIN D) 1000 units tablet Take 1,000 Units by mouth daily.   07/19/2018 at 0800  . dextromethorphan-guaiFENesin (MUCINEX DM) 30-600 MG 12hr tablet Take 1 tablet by mouth daily. TAKE 1 TABLET BY MOUTH ONCE DAILY  FOR COUGHING.   07/19/2018 at 0800  . DULoxetine (CYMBALTA) 60 MG capsule TAKE ONE CAPSULE BY MOUTH ONCE DAILY 30 capsule 2 07/19/2018 at 0800  . Ferrous Sulfate (IRON) 325 (65 Fe) MG TABS Take 1 tablet (325 mg total) by mouth every other day. 30 each 1 07/19/2018 at 0800  . gabapentin (NEURONTIN) 600 MG tablet TAKE ONE TABLET BY MOUTH THREE TIMES DAILY 90 tablet 5 07/19/2018 at 0800  . HYDROcodone-acetaminophen (NORCO) 10-325 MG tablet Take 1 tablet by mouth 2 (two) times daily as needed for moderate pain. (Patient taking differently: Take 1 tablet by mouth every 12 (twelve) hours as needed for moderate pain. ) 100 tablet 0 prn at prn  . imiquimod (ALDARA) 5 % cream Apply topically 3 (three) times a week. Apply to right upper arm topically at bedtime every Tuesday, Thursday, ans Saturday   07/16/2018 at 2000  . insulin NPH Human (NOVOLIN N RELION) 100 UNIT/ML injection Inject 40 units before breakfast and 10 units at bedtime (Patient taking differently: Inject 6 Units into the skin at bedtime. ) 20 mL 3 07/18/2018 at 2100  . insulin regular (NOVOLIN R RELION) 100 units/mL injection Use 10 units in the morning and 20 units at supper (Patient taking differently: Inject 20 units in the morning, 10 units in the evening; hold for b/s less than 50) 20 mL 3  07/19/2018 at 0830  . LORazepam (ATIVAN) 1 MG tablet Take 1 mg by mouth as needed.   prn at prn  . midodrine (PROAMATINE) 5 MG tablet Take 5 mg by mouth 2 (two) times daily with a meal. TAKE 1 TABLET BY MOUTH 2 TIMES DAILY.   07/19/2018 at 0800  . montelukast (SINGULAIR) 10 MG tablet Take 10 mg by mouth daily.    07/19/2018 at 0800  . sucralfate (CARAFATE) 1 GM/10ML suspension Take 1 g by mouth 4 (four) times daily -  before meals and at bedtime.   07/19/2018 at 1200   Scheduled:  . apixaban  2.5 mg Oral BID  . aspirin EC  81 mg Oral Daily  . atorvastatin  80 mg Oral Daily  . Chlorhexidine Gluconate Cloth  6 each Topical Q0600  . cholecalciferol  1,000 Units Oral Daily  . DULoxetine  60 mg Oral Daily  . [START ON 07/21/2018] ferrous sulfate  325 mg Oral QODAY  . furosemide  20 mg Intravenous Q12H  . gabapentin  600 mg Oral TID  . insulin aspart  0-5 Units Subcutaneous QHS  . insulin aspart  0-9 Units Subcutaneous TID WC  . insulin NPH Human  6 Units Subcutaneous QHS  . metoprolol succinate  25 mg Oral Daily  . midodrine  5 mg Oral BID WC  . montelukast  10 mg Oral Daily  . mupirocin ointment  1 application Nasal BID  . sucralfate  1 g Oral TID AC & HS   Infusions:   PRN: acetaminophen **OR** acetaminophen, albuterol, HYDROcodone-acetaminophen, ondansetron **OR** ondansetron (ZOFRAN) IV  Assessment: Pharmacy consulted to start apixaban for afib. Pt was on heparin, which will be d/c'ed   Goal of Therapy:  Monitor platelets by anticoagulation protocol: Yes   Plan:  Will start apixaban 2.5 mg BID. Pt is > 63 years old and Scr > 1.5 which qualifies pt for a dose reduction.   Oswald Hillock, PharmD, BCPS Clinical Pharmacist  07/20/2018,10:49 AM

## 2018-07-21 ENCOUNTER — Encounter: Payer: Medicare Other | Admitting: Nurse Practitioner

## 2018-07-21 DIAGNOSIS — R600 Localized edema: Secondary | ICD-10-CM

## 2018-07-21 DIAGNOSIS — I4891 Unspecified atrial fibrillation: Principal | ICD-10-CM

## 2018-07-21 LAB — GLUCOSE, CAPILLARY
GLUCOSE-CAPILLARY: 154 mg/dL — AB (ref 70–99)
GLUCOSE-CAPILLARY: 102 mg/dL — AB (ref 70–99)
Glucose-Capillary: 167 mg/dL — ABNORMAL HIGH (ref 70–99)

## 2018-07-21 LAB — BASIC METABOLIC PANEL
Anion gap: 12 (ref 5–15)
BUN: 64 mg/dL — ABNORMAL HIGH (ref 8–23)
CALCIUM: 8.3 mg/dL — AB (ref 8.9–10.3)
CO2: 17 mmol/L — ABNORMAL LOW (ref 22–32)
CREATININE: 2.24 mg/dL — AB (ref 0.44–1.00)
Chloride: 105 mmol/L (ref 98–111)
GFR calc Af Amer: 23 mL/min — ABNORMAL LOW (ref >60)
GFR calc non Af Amer: 19 mL/min — ABNORMAL LOW (ref >60)
Glucose, Bld: 146 mg/dL — ABNORMAL HIGH (ref 70–99)
Potassium: 4 mmol/L (ref 3.5–5.1)
Sodium: 134 mmol/L — ABNORMAL LOW (ref 135–145)

## 2018-07-21 LAB — CBC
HCT: 42 % (ref 36.0–46.0)
Hemoglobin: 12.7 g/dL (ref 12.0–15.0)
MCH: 27.4 pg (ref 26.0–34.0)
MCHC: 30.2 g/dL (ref 30.0–36.0)
MCV: 90.5 fL (ref 80.0–100.0)
NRBC: 0.5 % — AB (ref 0.0–0.2)
PLATELETS: 303 10*3/uL (ref 150–400)
RBC: 4.64 MIL/uL (ref 3.87–5.11)
RDW: 16.9 % — AB (ref 11.5–15.5)
WBC: 6 10*3/uL (ref 4.0–10.5)

## 2018-07-21 MED ORDER — ACETAMINOPHEN 500 MG PO TABS: 500.0000 mg | ORAL_TABLET | Freq: Four times a day (QID) | ORAL | 0 refills | Status: AC | PRN

## 2018-07-21 MED ORDER — MIDODRINE HCL 5 MG PO TABS
10.0000 mg | ORAL_TABLET | Freq: Once | ORAL | Status: AC
  Administered 2018-07-21: 10 mg via ORAL
  Filled 2018-07-21: qty 2

## 2018-07-21 MED ORDER — APIXABAN 2.5 MG PO TABS: 2.5000 mg | ORAL_TABLET | Freq: Two times a day (BID) | ORAL | 0 refills | Status: AC

## 2018-07-21 MED ORDER — HYDROCODONE-ACETAMINOPHEN 10-325 MG PO TABS: 1.0000 | ORAL_TABLET | Freq: Two times a day (BID) | ORAL | 0 refills | Status: AC | PRN

## 2018-07-21 MED ORDER — GABAPENTIN 600 MG PO TABS: 300.0000 mg | ORAL_TABLET | Freq: Two times a day (BID) | ORAL | Status: DC

## 2018-07-21 MED ORDER — HYDROCODONE-ACETAMINOPHEN 10-325 MG PO TABS: 1.0000 | ORAL_TABLET | Freq: Two times a day (BID) | ORAL | 0 refills | Status: DC | PRN

## 2018-07-21 MED ORDER — FUROSEMIDE 20 MG PO TABS: 20.0000 mg | ORAL_TABLET | Freq: Every day | ORAL | 0 refills | Status: AC

## 2018-07-21 MED ORDER — METOPROLOL SUCCINATE ER 50 MG PO TB24: 50.0000 mg | ORAL_TABLET | Freq: Every day | ORAL | 0 refills | Status: AC

## 2018-07-21 MED ORDER — METOPROLOL SUCCINATE ER 50 MG PO TB24: 50.0000 mg | ORAL_TABLET | Freq: Every day | ORAL | Status: DC

## 2018-07-21 MED ORDER — GABAPENTIN 600 MG PO TABS: 300.0000 mg | ORAL_TABLET | Freq: Two times a day (BID) | ORAL | 0 refills | Status: AC

## 2018-07-21 MED ORDER — METOPROLOL SUCCINATE ER 25 MG PO TB24
25.0000 mg | ORAL_TABLET | Freq: Once | ORAL | Status: AC
  Administered 2018-07-21: 25 mg via ORAL
  Filled 2018-07-21: qty 1

## 2018-07-21 NOTE — Clinical Social Work Note (Signed)
Clinical Social Work Assessment  Patient Details  Name: Andrea Wallace MRN: [id number] Date of Birth: 04-24-1932  Date of referral:  07/21/18               Reason for consult:  Other (Comment Required)(From [facility] )                Permission sought to share information with:  Chartered certified accountant granted to share information::  Yes, Verbal Permission Granted  Name::      [facility]   Agency::     Relationship::     Contact Information:     Housing/Transportation Living arrangements for the past 2 months:  [place] of Information:  Patient, Adult Children, Facility Patient Interpreter Needed:  None Criminal Activity/Legal Involvement Pertinent to Current Situation/Hospitalization:  No - Comment as needed Significant Relationships:  Adult Children Lives with:  Facility Resident Do you feel safe going back to the place where you live?  Yes Need for family participation in patient care:  Yes (Comment)  Care giving concerns:  Patient is a resident at [facility] (fax: [phone]).  Social Worker assessment / plan:  Holiday representative (CSW) reviewed chart and noted that patient is from [facility] and per MD patient is stable for D/C back to ALF today. CSW contacted [facility] and Doctor, general practice at [facility] and made her aware of above. Per Melissa patient can return to [facility] today and she is on room air at baseline and walks with a walker and plus 1 assistance at baseline. Per patient she is agreeable to D/C back to [facility] today. Patient's daughter Andrea Wallace reported that she would transport patient back to [facility]. CSW faxed FL2 and D/C summary to [facility] today. RN aware of above and will call report to ALF. RN case manager aware of above and will arrange home health PT and RN per [facility]'s request. Please reconsult if future social work needs arise. CSW signing off.    Employment status:   Disabled (Comment on whether or not currently receiving Disability), Retired Forensic scientist:  Medicare PT Recommendations:  Not assessed at this time Information / Referral to community resources:  Other (Comment Required)(Patient will return to [facility] )  Patient/Family's Response to care:  Patient and her daughter Andrea Wallace are agreeable for patient to return back to [facility] today.   Patient/Family's Understanding of and Emotional Response to Diagnosis, Current Treatment, and Prognosis:  Patient and her daughter were very pleasant and thanked CSW for assistance.   Emotional Assessment Appearance:  Appears stated age Attitude/Demeanor/Rapport:    Affect (typically observed):  Accepting, Adaptable, Pleasant Orientation:  Oriented to Self, Oriented to Place, Oriented to  Time, Oriented to Situation Alcohol / Substance use:  Not Applicable Psych involvement (Current and /or in the community):  No (Comment)  Discharge Needs  Concerns to be addressed:  No discharge needs identified Readmission within the last 30 days:    Current discharge risk:  None Barriers to Discharge:  No Barriers Identified   Tresha Muzio, Veronia Beets, LCSW 07/21/2018, 3:45 PM

## 2018-07-21 NOTE — Progress Notes (Signed)
Please note, patient has a Pending outpatient Palliative referral and was to be seen at [facility]. CSW [employer] made aware. Unclear if patient is returning to [facility] or discharging to a SNF. Flo Shanks RN, BSN, [facility] Clinical liaison Hydrologist [facility]  [phone]

## 2018-07-21 NOTE — Progress Notes (Signed)
Called report to [facility] and talked to [place], Therapist, sports. Will update the facility within an hour after giving the midodrine. RN will continue to monitor.

## 2018-07-21 NOTE — Discharge Summary (Signed)
[place] [facility] [place] NAME: Andrea Wallace    MR#:  [id number]  DATE OF BIRTH:  [birth year]  DATE OF ADMISSION:  07/19/2018 ADMITTING PHYSICIAN: Henreitta Leber, MD  DATE OF DISCHARGE: 07/21/2018  PRIMARY CARE PHYSICIAN: Kirk Ruths, MD   ADMISSION DIAGNOSIS:  Atrial fibrillation ([place]) [I48.91] New onset atrial fibrillation (HCC) [I48.91] Poor perfusion of leg [R09.89]  DISCHARGE DIAGNOSIS:  Active Problems:   Atrial fibrillation with RVR (HCC) Acute systolic heart failure Peripheral vascular disease Type 2 diabetes mellitus  SECONDARY DIAGNOSIS:   Past Medical History:  Diagnosis Date  . Allergy   . Anemia   . Arthritis   . Asthma    stress related per pt  . Blood transfusion   . Breast cancer [facility]) 2011   Right  . Cancer ([place])    right breast  . Complication of anesthesia    diff  waking up  . Cough   . Diabetes mellitus   . Family history of adverse reaction to anesthesia    " SISTER HAS DIFFICULTY WAKING "  . Generalized headaches   . History of kidney stones   . Hyperlipidemia   . Lung nodules   . Nasal congestion   . Neuromuscular disorder ([place])    neuropathy feet/legs/hands     ADMITTING HISTORY Andrea Wallace  is a 85 y.o. female with a known history of diabetes, hypertension, hyperlipidemia, history of breast cancer, osteoarthritis, chronic hypotension who presents to the hospital due to worsening lower extremity edema and discoloration of her lower extremities and feet bilaterally.  Patient resides at the [facility] and over the past few days the staff there has noticed that her feet are becoming more discolored and cool to touch.  She was therefore sent to the ER for further evaluation.  Initially since her extremities were cool to touch there was a concern that she had a vascular issue in a flow problem and therefore vascular surgery was consulted.  Surgery does not think that there is a acute  arterial poor flow issue presently.  Patient denies any lower extremity pain.  There is been no fever no chills no nausea or vomiting.  Patient does say that she has developed worsening dyspnea over the past few weeks to a month.  She was seen by her primary care physician recently and told she has congestive heart failure but she is currently not on diuretics.  She had an echocardiogram done as an outpatient last month which showed severe global LV dysfunction with a EF of 25%.  She presents to the ER with the above complaints and was also noted to be in new onset atrial fibrillation with variable rates and also with worsening lower extreme edema thought to be in mild congestive heart failure.  Hospitalist services were contacted for admission.   HOSPITAL COURSE:  Patient was admitted to telemetry.  Patient was diuresed with IV Lasix.  She was anticoagulated with IV heparin drip and transition to oral Eliquis.  Cardiology consultation was done and [facility] clinic cardiology evaluated the patient.  They recommended patient to be started on oral metoprolol for rate control.  Serial troponins were checked which were mildly elevated secondary to demand ischemia. patient's lower extremity swelling improved with diuresis.  She was also evaluated by vascular surgery for peripheral vascular disease and poor circulation in lower extremities.  According to vascular surgery patient has palpable pulses and they recommended outpatient work-up.  Patient was  transitioned to oral Eliquis.  ACE inhibitor could not be added secondary to renal failure.  Patient will be discharged to facility on oral metoprolol and oral Eliquis.  She will continue diuresis with Lasix.  Renal functions to be monitored.  She was worked up with echocardiogram which showed global systolic dysfunction with EF of 25%.  Patient will continue statin medication, beta-blocker and Lasix for diuresis.  CONSULTS OBTAINED:    DRUG ALLERGIES:   Allergies   Allergen Reactions  . Ciprofloxacin Diarrhea and Swelling  . Codeine Sulfate Itching  . Other     Other reaction(s): Other (See Comments) Mycins  . Sulfa Antibiotics     Other reaction(s): RASH    DISCHARGE MEDICATIONS:   Allergies as of 07/21/2018      Reactions   Ciprofloxacin Diarrhea, Swelling   Codeine Sulfate Itching   Other    Other reaction(s): Other (See Comments) Mycins   Sulfa Antibiotics    Other reaction(s): RASH      Medication List    TAKE these medications   acetaminophen 500 MG tablet Commonly known as:  TYLENOL Take 1 tablet (500 mg total) by mouth every 6 (six) hours as needed for moderate pain. What changed:    how much to take  when to take this   albuterol 108 (90 Base) MCG/ACT inhaler Commonly known as:  PROVENTIL HFA;VENTOLIN HFA Inhale 2 puffs into the lungs every 4 (four) hours as needed for wheezing or shortness of breath.   apixaban 2.5 MG Tabs tablet Commonly known as:  ELIQUIS Take 1 tablet (2.5 mg total) by mouth 2 (two) times daily for 30 days.   aspirin 81 MG tablet Take 81 mg by mouth daily.   atorvastatin 80 MG tablet Commonly known as:  LIPITOR TAKE 1 TABLET BY MOUTH ONCE DAILY   cholecalciferol 1000 units tablet Commonly known as:  VITAMIN D Take 1,000 Units by mouth daily.   dextromethorphan-guaiFENesin 30-600 MG 12hr tablet Commonly known as:  MUCINEX DM Take 1 tablet by mouth daily. TAKE 1 TABLET BY MOUTH ONCE DAILY FOR COUGHING.   DULoxetine 60 MG capsule Commonly known as:  CYMBALTA TAKE ONE CAPSULE BY MOUTH ONCE DAILY   furosemide 20 MG tablet Commonly known as:  LASIX Take 1 tablet (20 mg total) by mouth daily for 30 days.   gabapentin 600 MG tablet Commonly known as:  NEURONTIN Take 0.5 tablets (300 mg total) by mouth 2 (two) times daily for 30 days. Start taking on:  Jul 26, 2018 What changed:    how much to take  when to take this   HYDROcodone-acetaminophen 10-325 MG tablet Commonly  known as:  NORCO Take 1 tablet by mouth every 12 (twelve) hours as needed for moderate pain or severe pain. What changed:    when to take this  reasons to take this   imiquimod 5 % cream Commonly known as:  ALDARA Apply topically 3 (three) times a week. Apply to right upper arm topically at bedtime every Tuesday, Thursday, ans Saturday   insulin NPH Human 100 UNIT/ML injection Commonly known as:  NOVOLIN N RELION Inject 40 units before breakfast and 10 units at bedtime What changed:    how much to take  how to take this  when to take this  additional instructions   insulin regular 100 units/mL injection Commonly known as:  NOVOLIN R RELION Use 10 units in the morning and 20 units at supper What changed:  additional instructions   Iron  325 (65 Fe) MG Tabs Take 1 tablet (325 mg total) by mouth every other day.   LORazepam 1 MG tablet Commonly known as:  ATIVAN Take 1 mg by mouth as needed.   metoprolol succinate 50 MG 24 hr tablet Commonly known as:  TOPROL-XL Take 1 tablet (50 mg total) by mouth daily for 30 days. Take with or immediately following a meal. Start taking on:  2018-08-09   midodrine 5 MG tablet Commonly known as:  PROAMATINE Take 5 mg by mouth 2 (two) times daily with a meal. TAKE 1 TABLET BY MOUTH 2 TIMES DAILY.   montelukast 10 MG tablet Commonly known as:  SINGULAIR Take 10 mg by mouth daily.   sucralfate 1 GM/10ML suspension Commonly known as:  CARAFATE Take 1 g by mouth 4 (four) times daily -  before meals and at bedtime.       Today  Patient seen and evaluated today No complaints of shortness of breath Comfortably breathing on room air No chest pain Hemodynamically stable VITAL SIGNS:  Blood pressure 95/76, pulse (!) 101, temperature 97.7 F (36.5 C), temperature source Oral, resp. rate 20, height 5\' 5"  (1.651 m), weight 75.1 kg, SpO2 97 %.  I/O:    Intake/Output Summary (Last 24 hours) at 07/21/2018 1154 Last data filed  at 07/21/2018 0536 Gross per 24 hour  Intake -  Output 1050 ml  Net -1050 ml    PHYSICAL EXAMINATION:  Physical Exam  GENERAL:  85 y.o.-year-old patient lying in the bed with no acute distress.  LUNGS: Normal breath sounds bilaterally, no wheezing, rales,rhonchi or crepitation. No use of accessory muscles of respiration.  CARDIOVASCULAR: S1, S2 irregular. No murmurs, rubs, or gallops.  ABDOMEN: Soft, non-tender, non-distended. Bowel sounds present. No organomegaly or mass.  NEUROLOGIC: Moves all 4 extremities. PSYCHIATRIC: The patient is alert and oriented x 3.  SKIN: No obvious rash, lesion, or ulcer.   DATA REVIEW:   CBC Recent Labs  Lab 07/21/18 0745  WBC 6.0  HGB 12.7  HCT 42.0  PLT 303    Chemistries  Recent Labs  Lab 07/21/18 0623  NA 134*  K 4.0  CL 105  CO2 17*  GLUCOSE 146*  BUN 64*  CREATININE 2.24*  CALCIUM 8.3*    Cardiac Enzymes Recent Labs  Lab 07/20/18 0743  TROPONINI 0.09*    Microbiology Results  Results for orders placed or performed during the hospital encounter of 07/19/18  MRSA PCR Screening     Status: Abnormal   Collection Time: 07/20/18  3:54 AM  Result Value Ref Range Status   MRSA by PCR POSITIVE (A) NEGATIVE Final    Comment:        The GeneXpert MRSA Assay (FDA approved for NASAL specimens only), is one component of a comprehensive MRSA colonization surveillance program. It is not intended to diagnose MRSA infection nor to guide or monitor treatment for MRSA infections. RESULT CALLED TO, READ BACK BY AND VERIFIED WITH: Georgian Co 07/20/2018 0537 REC Performed at [facility], [address] [place], [place] [address]     RADIOLOGY:  Dg Chest Port 1 View  Result Date: 07/19/2018 CLINICAL DATA:  85 year old female with a history of bilateral foot pain and swelling EXAM: PORTABLE CHEST 1 VIEW COMPARISON:  02/04/2018, chest CT 05/10/2018 FINDINGS: Cardiomediastinal silhouette unchanged in size and contour  with cardiomegaly. Coarsened interstitial markings throughout the lungs. No evidence of confluent airspace disease. No pleural effusion or pneumothorax. No displaced fracture. IMPRESSION:  Chronic lung changes without evidence of acute cardiopulmonary disease Electronically Signed   By: Corrie Mckusick D.O.   On: 07/19/2018 18:26    Follow up with PCP in 1 week.  Management plans discussed with the patient, family and they are in agreement.  CODE STATUS: Full code    Code Status Orders  (From admission, onward)         Start     Ordered   07/19/18 2341  Full code  Continuous     07/19/18 2341        Code Status History    Date Active Date Inactive Code Status Order ID Comments User Context   10/28/2016 1408 10/29/2016 1553 Full Code [id number]  Elmon Else Inpatient   08/06/2016 1056 08/06/2016 1604 Full Code [id number]  Algernon Huxley, MD Inpatient   06/27/2012 1457 07/01/2012 1801 Full Code [id number]  Annitta Needs, RN Inpatient    Advance Directive Documentation     Most Recent Value  Type of Advance Directive  Healthcare Power of Attorney, Living will  Pre-existing out of facility DNR order (yellow form or pink MOST form)  -  "MOST" Form in Place?  -      TOTAL TIME TAKING CARE OF THIS PATIENT ON DAY OF DISCHARGE: more than 35 minutes.   Saundra Shelling M.D on 07/21/2018 at 11:54 AM  Between 7am to 6pm - Pager - [phone]  After 6pm go to [email or web address] - password [email or web address] [place] [facility]  Office  [phone]  CC: Primary care physician; Kirk Ruths, MD  Note: This dictation was prepared with Dragon dictation along with smaller phrase technology. Any transcriptional errors that result from this process are unintentional.

## 2018-07-21 NOTE — Progress Notes (Signed)
[facility] Daily Progress Note    Subjective: Patient without complaint this AM.   Objective: Vitals:   07/20/18 2004 07/20/18 2300 07/21/18 0511 07/21/18 0523  BP: (!) 127/107 113/74 95/76   Pulse: 72  (!) 116 (!) 101  Resp: 20  20   Temp:   97.7 F (36.5 C)   TempSrc:   Oral   SpO2: 95%  97%   Weight:    75.1 kg  Height:        Intake/Output Summary (Last 24 hours) at 07/21/2018 1012 Last data filed at 07/21/2018 0536 Gross per 24 hour  Intake -  Output 1050 ml  Net -1050 ml   Physical Exam: A&Ox3, NAD CV: Irregular Pulmonary: CTA Bilaterally Abdomen: Soft, Nontender, Nondistended Vascular:  Edema has improved. Extremities are warm. Palpable pedal pulses.    Laboratory: CBC    Component Value Date/Time   WBC 6.0 07/21/2018 0745   HGB 12.7 07/21/2018 0745   HGB 12.7 06/17/2010 1051   HCT 42.0 07/21/2018 0745   HCT 38.5 06/17/2010 1051   PLT 303 07/21/2018 0745   PLT 223 06/17/2010 1051   BMET    Component Value Date/Time   NA 134 (L) 07/21/2018 0623   NA 137 11/12/2009 1141   K 4.0 07/21/2018 0623   K 4.0 11/12/2009 1141   CL 105 07/21/2018 0623   CL 98 11/12/2009 1141   CO2 17 (L) 07/21/2018 0623   CO2 30 11/12/2009 1141   GLUCOSE 146 (H) 07/21/2018 0623   GLUCOSE 70 (L) 11/12/2009 1141   BUN 64 (H) 07/21/2018 0623   BUN 25 (H) 11/12/2009 1141   CREATININE 2.24 (H) 07/21/2018 0623   CREATININE 1.37 (H) 07/04/2013 1222   CALCIUM 8.3 (L) 07/21/2018 0623   CALCIUM 9.7 11/12/2009 1141   GFRNONAA 19 (L) 07/21/2018 0623   GFRAA 23 (L) 07/21/2018 3845   Assessment/Planning: The patient is an 85 year old female with multiple medical issues who was admitted for new onset Afib and CHF exacerbation 1) Bilateral edema is improved 2) Palpable pedal pluses.  3) Will continue PAD / edema work up as an outpatient.  4) Vascular surgery will sign off at this time.   Discussed with Dr. Ellis Parents Stegmayer PA-C 07/21/2018 10:12  AM

## 2018-07-21 NOTE — NC FL2 (Signed)
[place] LEVEL OF CARE SCREENING TOOL     IDENTIFICATION  Patient Name: Andrea Wallace Birthdate: 11-03-1931 Sex: female Admission Date (Current Location): 07/19/2018  [place] and [place] Number:  Engineering geologist and Address:  [facility], [address] [place], [place] [address]      Provider Number: [id number]  Attending Physician Name and Address:  Saundra Shelling, MD  Relative Name and Phone Number:       Current Level of Care: Hospital Recommended Level of Care: [place] Prior Approval Number:    Date Approved/Denied:   PASRR Number:    Discharge Plan: Domiciliary (Rest home)    Current Diagnoses: Patient Active Problem List   Diagnosis Date Noted  . Atrial fibrillation with RVR ([place]) 07/19/2018  . Chronic orthostatic hypotension 02/11/2018  . Chest pain 02/04/2018  . PAD (peripheral artery disease) ([place]) 03/23/2017  . SMA stenosis ([place]) 03/23/2017  . Displaced fracture of olecranon process of right ulna with intra-articular extension 10/28/2016  . Displaced fracture of olecranon process with intraarticular extension of right ulna, initial encounter for closed fracture 10/28/2016  . Essential hypertension, benign 07/28/2016  . Hyperlipidemia 07/28/2016  . Abdominal pain 07/28/2016  . Chronic mesenteric ischemia ([place]) 07/28/2016  . Diabetic polyneuropathy associated with type 2 diabetes mellitus ([place]) 02/25/2016  . Mass of lower lobe of left lung 02/16/2016  . Calculus of kidney 08/14/2015  . Abnormal presence of protein in urine 07/29/2015  . Osteopenia 12/18/2014  . Atherosclerosis of abdominal aorta ([place]) 03/24/2014  . Carotid arterial disease ([place]) 01/06/2014  . Headache, migraine 01/06/2014  . Lumbar canal stenosis 01/06/2014  . Carotid artery disease ([place]) 01/06/2014  . Asthma in adult without complication 14/43/1540  . CAD (coronary artery disease) 01/06/2014  . OA (osteoarthritis)  01/06/2014  . Thyroid goiter 01/06/2014  . Back pain, chronic 10/30/2013  . Chronic kidney disease, stage III (moderate) ([place]) 05/03/2013  . Type II diabetes mellitus, uncontrolled ([place]) 01/11/2013  . Other and unspecified hyperlipidemia 12/28/2012  . Multinodular goiter 11/01/2012  . Diabetes ([place]) 06/29/2012  . Lumbar stenosis with neurogenic claudication 06/29/2012  . Anemia associated with acute blood loss 06/29/2012  . Breast cancer ([place]) 03/16/2011    Orientation RESPIRATION BLADDER Height & Weight     Self, Time, Place, Situation  Normal Incontinent Weight: 165 lb 9.1 oz (75.1 kg) Height:  5\' 5"  (165.1 cm)  BEHAVIORAL SYMPTOMS/MOOD NEUROLOGICAL BOWEL NUTRITION STATUS      Continent Diet(Diet: Heart Healthy/ Carb Modified. )  AMBULATORY STATUS COMMUNICATION OF NEEDS Skin   Limited Assist Verbally Normal                       Personal Care Assistance Level of Assistance  Bathing, Feeding, Dressing Bathing Assistance: Limited assistance Feeding assistance: Independent Dressing Assistance: Limited assistance     Functional Limitations Info  Sight, Hearing, Speech Sight Info: Adequate Hearing Info: Adequate Speech Info: Adequate    SPECIAL CARE FACTORS FREQUENCY  PT (By licensed PT)     PT Frequency: (Home Health PT and RN 2-3. )              Contractures      Additional Factors Info  Code Status, Allergies Code Status Info: (Full Code. ) Allergies Info: (Ciprofloxacin, Codeine Sulfate, Other, Sulfa Antibiotics)           Current Medications (07/21/2018):  This is the current hospital active medication list Current Facility-Administered Medications  Medication Dose Route Frequency Provider Last Rate Last Dose  . acetaminophen (TYLENOL) tablet 650 mg  650 mg Oral Q6H PRN Henreitta Leber, MD   650 mg at 07/20/18 0403   Or  . acetaminophen (TYLENOL) suppository 650 mg  650 mg Rectal Q6H PRN Henreitta Leber, MD      . albuterol (PROVENTIL) (2.5  MG/3ML) 0.083% nebulizer solution 2.5 mg  2.5 mg Inhalation Q4H PRN Henreitta Leber, MD      . apixaban Arne Cleveland) tablet 2.5 mg  2.5 mg Oral BID Oswald Hillock, RPH   2.5 mg at 07/21/18 1012  . aspirin EC tablet 81 mg  81 mg Oral Daily Henreitta Leber, MD   81 mg at 07/21/18 1009  . atorvastatin (LIPITOR) tablet 80 mg  80 mg Oral Daily Henreitta Leber, MD   80 mg at 07/20/18 1702  . Chlorhexidine Gluconate Cloth 2 % PADS 6 each  6 each Topical Q0600 Henreitta Leber, MD   6 each at 07/20/18 [phone]  . cholecalciferol (VITAMIN D) tablet 1,000 Units  1,000 Units Oral Daily Henreitta Leber, MD   1,000 Units at 07/21/18 1009  . DULoxetine (CYMBALTA) DR capsule 60 mg  60 mg Oral Daily Henreitta Leber, MD   60 mg at 07/21/18 1009  . ferrous sulfate tablet 325 mg  325 mg Oral QODAY Henreitta Leber, MD   325 mg at 07/21/18 1009  . furosemide (LASIX) injection 20 mg  20 mg Intravenous Q12H Henreitta Leber, MD   20 mg at 07/21/18 1145  . [START ON 08-12-2018] gabapentin (NEURONTIN) tablet 300 mg  300 mg Oral BID Pyreddy, Reatha Harps, MD      . HYDROcodone-acetaminophen (NORCO/VICODIN) 5-325 MG per tablet 1-2 tablet  1-2 tablet Oral Q12H PRN Henreitta Leber, MD      . insulin aspart (novoLOG) injection 0-5 Units  0-5 Units Subcutaneous QHS Henreitta Leber, MD   2 Units at 07/20/18 2300  . insulin aspart (novoLOG) injection 0-9 Units  0-9 Units Subcutaneous TID WC Henreitta Leber, MD   2 Units at 07/21/18 1200  . insulin NPH Human (HUMULIN N,NOVOLIN N) injection 6 Units  6 Units Subcutaneous QHS Henreitta Leber, MD   6 Units at 07/20/18 2300  . [START ON 08/12/18] metoprolol succinate (TOPROL-XL) 24 hr tablet 50 mg  50 mg Oral Daily Pyreddy, Pavan, MD      . midodrine (PROAMATINE) tablet 5 mg  5 mg Oral BID WC Henreitta Leber, MD   5 mg at 07/21/18 0811  . montelukast (SINGULAIR) tablet 10 mg  10 mg Oral Daily Henreitta Leber, MD   10 mg at 07/21/18 1009  . mupirocin ointment (BACTROBAN) 2 % 1 application   1 application Nasal BID Henreitta Leber, MD   1 application at 64/40/34 1007  . ondansetron (ZOFRAN) tablet 4 mg  4 mg Oral Q6H PRN Henreitta Leber, MD       Or  . ondansetron (ZOFRAN) injection 4 mg  4 mg Intravenous Q6H PRN Henreitta Leber, MD      . sodium chloride flush (NS) 0.9 % injection 3 mL  3 mL Intravenous Q12H Pyreddy, Pavan, MD   3 mL at 07/21/18 1013  . sodium chloride flush (NS) 0.9 % injection 3 mL  3 mL Intravenous PRN Saundra Shelling, MD   3 mL at 07/20/18 2312  . sucralfate (CARAFATE) 1 GM/10ML suspension 1 g  1  g Oral TID AC & HS Henreitta Leber, MD   1 g at 07/21/18 1300     Discharge Medications: Please see discharge summary for a list of discharge medications.  Relevant Imaging Results:  Relevant Lab Results:   Additional Information (SSN: [id number])  Roniesha Hollingshead, Veronia Beets, LCSW

## 2018-07-21 NOTE — Progress Notes (Signed)
Talked to Dr. Estanislado Pandy about patient's BP of 70/53, patient is asymptomatic and is chronically hypotensive, order to give 10 mg of midodrine and to recheck after. Also clarify the doses for the medication she is being discharge. There is an order to discharge her with 20 mg lasix p.o daily and metoprolol 50 mg daily, MD wants to keep this doses because patient's HR still 105's and afib. Will tell patient's daughter and update them about this. RN will continue to monitor.

## 2018-07-21 NOTE — Plan of Care (Signed)
Pt blood pressure will improve to prevent any falls.

## 2018-07-21 NOTE — Progress Notes (Signed)
EMS is here to take patient to [facility], BP is 120/80 after midodrine. Sent discharge packet with the EMS. Discontinue peripheral IV and telemetry monitor. Called daughter and updated about the transfer. Called report and talked to [place].

## 2018-07-24 DEATH — deceased

## 2018-07-27 ENCOUNTER — Encounter: Payer: Medicare Other | Admitting: Student

## 2018-09-13 ENCOUNTER — Encounter: Payer: Self-pay | Admitting: Oncology

## 2018-09-13 ENCOUNTER — Ambulatory Visit: Payer: Medicare Other | Admitting: Endocrinology

## 2018-10-03 ENCOUNTER — Ambulatory Visit: Payer: Medicare Other | Admitting: Oncology

## 2018-10-03 ENCOUNTER — Other Ambulatory Visit: Payer: Medicare Other

## 2018-10-03 ENCOUNTER — Ambulatory Visit: Payer: Medicare Other
